# Patient Record
Sex: Female | Born: 1970 | Race: Black or African American | Hispanic: No | Marital: Single | Smoking: Current every day smoker
Health system: Southern US, Community
[De-identification: ages and names within clinical notes are randomized; demographics above are authoritative.]

## PROBLEM LIST (undated history)

## (undated) DIAGNOSIS — R87619 Unspecified abnormal cytological findings in specimens from cervix uteri: Secondary | ICD-10-CM

## (undated) DIAGNOSIS — E119 Type 2 diabetes mellitus without complications: Secondary | ICD-10-CM

## (undated) DIAGNOSIS — N6320 Unspecified lump in the left breast, unspecified quadrant: Secondary | ICD-10-CM

## (undated) DIAGNOSIS — D649 Anemia, unspecified: Secondary | ICD-10-CM

## (undated) DIAGNOSIS — F419 Anxiety disorder, unspecified: Secondary | ICD-10-CM

## (undated) DIAGNOSIS — K219 Gastro-esophageal reflux disease without esophagitis: Secondary | ICD-10-CM

## (undated) DIAGNOSIS — Z8719 Personal history of other diseases of the digestive system: Secondary | ICD-10-CM

## (undated) DIAGNOSIS — F329 Major depressive disorder, single episode, unspecified: Secondary | ICD-10-CM

## (undated) DIAGNOSIS — I10 Essential (primary) hypertension: Secondary | ICD-10-CM

## (undated) DIAGNOSIS — G5600 Carpal tunnel syndrome, unspecified upper limb: Secondary | ICD-10-CM

## (undated) DIAGNOSIS — R7303 Prediabetes: Secondary | ICD-10-CM

## (undated) DIAGNOSIS — F32A Depression, unspecified: Secondary | ICD-10-CM

## (undated) DIAGNOSIS — M1990 Unspecified osteoarthritis, unspecified site: Secondary | ICD-10-CM

## (undated) HISTORY — PX: NO PAST SURGERIES: SHX2092

## (undated) HISTORY — DX: Essential (primary) hypertension: I10

## (undated) HISTORY — DX: Depression, unspecified: F32.A

## (undated) HISTORY — DX: Unspecified abnormal cytological findings in specimens from cervix uteri: R87.619

## (undated) HISTORY — DX: Unspecified lump in the left breast, unspecified quadrant: N63.20

## (undated) HISTORY — DX: Gastro-esophageal reflux disease without esophagitis: K21.9

## (undated) HISTORY — DX: Anxiety disorder, unspecified: F41.9

## (undated) HISTORY — PX: TUBAL LIGATION: SHX77

## (undated) HISTORY — DX: Carpal tunnel syndrome, unspecified upper limb: G56.00

---
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
## 2019-12-09 ENCOUNTER — Ambulatory Visit (INDEPENDENT_AMBULATORY_CARE_PROVIDER_SITE_OTHER): Payer: Managed Care, Other (non HMO) | Admitting: Neurology

## 2019-12-09 ENCOUNTER — Other Ambulatory Visit: Payer: Self-pay

## 2019-12-09 ENCOUNTER — Encounter: Payer: Self-pay | Admitting: Neurology

## 2019-12-09 VITALS — BP 116/77 | HR 63 | Temp 97.4°F | Ht 61.0 in | Wt 157.0 lb

## 2019-12-09 DIAGNOSIS — I10 Essential (primary) hypertension: Secondary | ICD-10-CM | POA: Diagnosis not present

## 2019-12-09 DIAGNOSIS — F419 Anxiety disorder, unspecified: Secondary | ICD-10-CM | POA: Insufficient documentation

## 2019-12-09 DIAGNOSIS — R202 Paresthesia of skin: Secondary | ICD-10-CM

## 2019-12-09 DIAGNOSIS — M25512 Pain in left shoulder: Secondary | ICD-10-CM

## 2019-12-09 DIAGNOSIS — F329 Major depressive disorder, single episode, unspecified: Secondary | ICD-10-CM | POA: Diagnosis not present

## 2019-12-09 DIAGNOSIS — G5600 Carpal tunnel syndrome, unspecified upper limb: Secondary | ICD-10-CM | POA: Insufficient documentation

## 2019-12-09 DIAGNOSIS — G8929 Other chronic pain: Secondary | ICD-10-CM

## 2019-12-09 DIAGNOSIS — M79641 Pain in right hand: Secondary | ICD-10-CM

## 2019-12-09 DIAGNOSIS — F32A Depression, unspecified: Secondary | ICD-10-CM | POA: Insufficient documentation

## 2019-12-09 NOTE — Progress Notes (Signed)
[facility]    Provider:  Dr Lucia Gaskins Requesting Provider: Suzan Slick, MD Primary Care Provider:  Suzan Slick, MD  CC:  Numbness  HPI:  Suzanne Lawrence is a 49 y.o. female here as requested by Suzan Slick, MD for numbness.  Past medical history high blood pressure, depression, carpal tunnel syndrome, anxiety.  I reviewed Dr. Sherilyn Dacosta notes, patient is on Effexor for depression, she reported hand numbness and vertigo, the vertigo resolved but she still reports numbness with the ring finger on the right hand, it comes and goes, has occasional neck pain and discomfort, it appears she is on amitriptyline at bedtime and trazodone.  She has numbness digits 3-4, started in November after starting venlafaxine that she noticed the tingling in fingers and she was waking up soaking wet.Venlafaxine was changed, hot sweats resolved, but still right 4th digit I numb at the tip. No trauma. It is a steady tingle, a little better now, used to be more severe, now it is all day long. No weakness in the hand. Hgba1c is ok, not diabetic. Still has some tingling right lateral feet and radiates to the top of the foot, episodic usually when waking. She has pain in the left shoulder and feels like it comes up to the neck and down the arm. She can't sleep on the left shoulder and in the mornings it is stiff. She has some back ache but nothing significant or shooting into the legs. She had low b12 in the past. No other focal neurologic deficits, associated symptoms, inciting events or modifiable factors.  Reviewed notes, labs and imaging from outside physicians, which showed:  Appears she had a shoulder xray, will request results  Review of Systems: Patient complains of symptoms per HPI as well as the following symptoms: Numbness, shiftwork, restless legs, depression, anxiety, racing thoughts, decreased energy, joint pain, aching muscles, depression, anxiety. Pertinent negatives and  positives per HPI. All others negative.   Social History   Socioeconomic History  . Marital status: Single    Spouse name: Not on file  . Number of children: 3  . Years of education: Not on file  . Highest education level: Some college, no degree  Occupational History  . Not on file  Tobacco Use  . Smoking status: Current Every Day Smoker    Packs/day: 0.50    Types: Cigarettes  . Smokeless tobacco: Never Used  Substance and Sexual Activity  . Alcohol use: Never  . Drug use: Never  . Sexual activity: Not on file  Other Topics Concern  . Not on file  Social History Narrative   Lives at home with 2 daughters   Right handed   Caffeine: about 2-3 per day    Social Determinants of Health   Financial Resource Strain:   . Difficulty of Paying Living Expenses:   Food Insecurity:   . Worried About Programme researcher, broadcasting/film/video in the Last Year:   . Barista in the Last Year:   Transportation Needs:   . Freight forwarder (Medical):   Marland Kitchen Lack of Transportation (Non-Medical):   Physical Activity:   . Days of Exercise per Week:   . Minutes of Exercise per Session:   Stress:   . Feeling of Stress :   Social Connections:   . Frequency of Communication with Friends and Family:   . Frequency of Social Gatherings with Friends and Family:   . Attends Religious Services:   . Active Member  of Clubs or Organizations:   . Attends Banker Meetings:   Marland Kitchen Marital Status:   Intimate Partner Violence:   . Fear of Current or Ex-Partner:   . Emotionally Abused:   Marland Kitchen Physically Abused:   . Sexually Abused:     Family History  Problem Relation Age of Onset  . Lung cancer Mother   . Other Father        multiple issues  . Prostate cancer Father   . Aneurysm Paternal Grandmother   . High blood pressure Other        father's side   . Cerebral aneurysm Other        cerebral, on father's side   . Alcoholism Other        maternal side   . Aneurysm Cousin        paternal  side  . Aneurysm Cousin        paternal side   . Breast cancer Maternal Aunt   . Breast cancer Cousin        maternal side   . Prostate cancer Maternal Uncle   . Alcoholism Maternal Uncle     Past Medical History:  Diagnosis Date  . Abnormal Pap smear of cervix   . Anxiety   . Carpal tunnel syndrome    right  . Depression   . High blood pressure   . Left breast mass     Patient Active Problem List   Diagnosis Date Noted  . High blood pressure   . Depression   . Carpal tunnel syndrome   . Anxiety     Past Surgical History:  Procedure Laterality Date  . NO PAST SURGERIES      Current Outpatient Medications  Medication Sig Dispense Refill  . amitriptyline (ELAVIL) 25 MG tablet Take 25 mg by mouth at bedtime.    Marland Kitchen amLODipine (NORVASC) 10 MG tablet Take 10 mg by mouth daily.    Marland Kitchen atenolol (TENORMIN) 50 MG tablet Take 50 mg by mouth daily.    Marland Kitchen atorvastatin (LIPITOR) 10 MG tablet Take 10 mg by mouth daily.    . chlorthalidone (HYGROTON) 25 MG tablet Take 25 mg by mouth every morning.    . hydrOXYzine (ATARAX/VISTARIL) 10 MG tablet Take by mouth as needed for anxiety.     Marland Kitchen lisinopril (ZESTRIL) 40 MG tablet Take 40 mg by mouth daily.    . Multiple Vitamin (MULTI-VITAMIN) tablet Take by mouth.    . pantoprazole (PROTONIX) 20 MG tablet Take 20 mg by mouth daily.    . traZODone (DESYREL) 50 MG tablet Take by mouth at bedtime as needed.      No current facility-administered medications for this visit.    Allergies as of 12/09/2019 - Review Complete 12/09/2019  Allergen Reaction Noted  . Hydrochlorothiazide  04/02/2017    Vitals: BP 116/77 (BP Location: Left Arm, Patient Position: Sitting)   Pulse 63   Temp (!) 97.4 F (36.3 C)   Ht 5\' 1"  (1.549 m)   Wt 157 lb (71.2 kg)   BMI 29.66 kg/m  Last Weight:  Wt Readings from Last 1 Encounters:  12/09/19 157 lb (71.2 kg)   Last Height:   Ht Readings from Last 1 Encounters:  12/09/19 5\' 1"  (1.549 m)     Physical  exam: Exam: Gen: NAD, conversant, well nourised, obese, well groomed                     CV:  RRR, no MRG. No Carotid Bruits. No peripheral edema, warm, nontender Eyes: Conjunctivae clear without exudates or hemorrhage  Neuro: Detailed Neurologic Exam  Speech:    Speech is normal; fluent and spontaneous with normal comprehension.  Cognition:    The patient is oriented to person, place, and time;     recent and remote memory intact;     language fluent;     normal attention, concentration,     fund of knowledge Cranial Nerves:    The pupils are equal, round, and reactive to light. The fundi are normal and spontaneous venous pulsations are present. Visual fields are full to finger confrontation. Extraocular movements are intact. Trigeminal sensation is intact and the muscles of mastication are normal. The face is symmetric. The palate elevates in the midline. Hearing intact. Voice is normal. Shoulder shrug is normal. The tongue has normal motion without fasciculations.   Coordination:    No dysmetria  Gait:    Normal native gait  Motor Observation:    No asymmetry, no atrophy, and no involuntary movements noted. Tone:    Normal muscle tone.    Posture:    Posture is normal. normal erect    Strength:    Strength is V/V in the upper and lower limbs.      Sensation: intact to LT     Reflex Exam:  DTR's:    Deep tendon reflexes in the upper and lower extremities are normal bilaterally.   Toes:    The toes are downgoing bilaterally.   Clonus:    Clonus is absent.    Assessment/Plan:  Patient here for numbness right hand digit 4 may be CTS. Paresthesias in the legs and top of feet in the morning likely due to temporary peroneal compression as she sleeps in a fetal position, advised her to try and not keep her legs bent for long periods of time. She has pain from the neck radiating into the left shoulder, we will check emg/ncs left arm and right arm  Right hand: Wear wrist  splint all day and order emg/ncs Left shoulder pain may be from radiation from the nck, emg/ncs PT on left arm and shoulder and right hand CTS - Bibo I'll check B12 due to her paresthesias, she is not diabetic   Orders Placed This Encounter  Procedures  . B12 and Folate Panel  . Methylmalonic acid, serum  . Ambulatory referral to Physical Therapy  . NCV with EMG(electromyography)   No orders of the defined types were placed in this encounter.   Cc: Leeanne Rio, MD,    Sarina Ill, MD  [facility] [address] [place], [place] [address]  Phone [phone] Fax [phone]

## 2019-12-09 NOTE — Patient Instructions (Addendum)
Physical Therapy for left arm/shoulder  Electromyoneurogram Electromyoneurogram is a test to check how well your muscles and nerves are working. This procedure includes the combined use of electromyogram (EMG) and nerve conduction study (NCS). EMG is used to look for muscular disorders. NCS, which is also called electroneurogram, measures how well your nerves are controlling your muscles. The procedures are usually done together to check if your muscles and nerves are healthy. If the results of the tests are abnormal, this may indicate disease or injury, such as a neuromuscular disease or peripheral nerve damage. Tell a health care provider about:  Any allergies you have.  All medicines you are taking, including vitamins, herbs, eye drops, creams, and over-the-counter medicines.  Any problems you or family members have had with anesthetic medicines.  Any blood disorders you have.  Any surgeries you have had.  Any medical conditions you have.  If you have a pacemaker.  Whether you are pregnant or may be pregnant. What are the risks? Generally, this is a safe procedure. However, problems may occur, including:  Infection where the electrodes were inserted.  Bleeding. What happens before the procedure? Medicines Ask your health care provider about:  Changing or stopping your regular medicines. This is especially important if you are taking diabetes medicines or blood thinners.  Taking medicines such as aspirin and ibuprofen. These medicines can thin your blood. Do not take these medicines unless your health care provider tells you to take them.  Taking over-the-counter medicines, vitamins, herbs, and supplements. General instructions  Your health care provider may ask you to avoid: ? Beverages that have caffeine, such as coffee and tea. ? Any products that contain nicotine or tobacco. These products include cigarettes, e-cigarettes, and chewing tobacco. If you need help quitting,  ask your health care provider.  Do not use lotions or creams on the same day that you will be having the procedure. What happens during the procedure? For EMG   Your health care provider will ask you to stay in a position so that he or she can access the muscle that will be studied. You may be standing, sitting, or lying down.  You may be given a medicine that numbs the area (local anesthetic).  A very thin needle that has an electrode will be inserted into your muscle.  Another small electrode will be placed on your skin near the muscle.  Your health care provider will ask you to continue to remain still.  The electrodes will send a signal that tells about the electrical activity of your muscles. You may see this on a monitor or hear it in the room.  After your muscles have been studied at rest, your health care provider will ask you to contract or flex your muscles. The electrodes will send a signal that tells about the electrical activity of your muscles.  Your health care provider will remove the electrodes and the electrode needles when the procedure is finished. The procedure may vary among health care providers and hospitals. For NCS   An electrode that records your nerve activity (recording electrode) will be placed on your skin by the muscle that is being studied.  An electrode that is used as a reference (reference electrode) will be placed near the recording electrode.  A paste or gel will be applied to your skin between the recording electrode and the reference electrode.  Your nerve will be stimulated with a mild shock. Your health care provider will measure how much time it  takes for your muscle to react.  Your health care provider will remove the electrodes and the gel when the procedure is finished. The procedure may vary among health care providers and hospitals. What happens after the procedure?  It is up to you to get the results of your procedure. Ask your  health care provider, or the department that is doing the procedure, when your results will be ready.  Your health care provider may: ? Give you medicines for any pain. ? Monitor the insertion sites to make sure that bleeding stops. Summary  Electromyoneurogram is a test to check how well your muscles and nerves are working.  If the results of the tests are abnormal, this may indicate disease or injury.  This is a safe procedure. However, problems may occur, such as bleeding and infection.  Your health care provider will do two tests to complete this procedure. One checks your muscles (EMG) and another checks your nerves (NCS).  It is up to you to get the results of your procedure. Ask your health care provider, or the department that is doing the procedure, when your results will be ready. This information is not intended to replace advice given to you by your health care provider. Make sure you discuss any questions you have with your health care provider. Document Revised: 04/28/2018 Document Reviewed: 04/10/2018 Elsevier Patient Education  [place] tunnel syndrome is a condition that causes pain in your hand and arm. The carpal tunnel is a narrow area that is on the palm side of your wrist. Repeated wrist motion or certain diseases may cause swelling in the tunnel. This swelling can pinch the main nerve in the wrist (median nerve). What are the causes? This condition may be caused by:  Repeated wrist motions.  Wrist injuries.  Arthritis.  A sac of fluid (cyst) or abnormal growth (tumor) in the carpal tunnel.  Fluid buildup during pregnancy. Sometimes the cause is not known. What increases the risk? The following factors may make you more likely to develop this condition:  Having a job in which you move your wrist in the same way many times. This includes jobs like being a Software engineer or a Scientist, water quality.  Being a woman.  Having other  health conditions, such as: ? Diabetes. ? Obesity. ? A thyroid gland that is not active enough (hypothyroidism). ? Kidney failure. What are the signs or symptoms? Symptoms of this condition include:  A tingling feeling in your fingers.  Tingling or a loss of feeling (numbness) in your hand.  Pain in your entire arm. This pain may get worse when you bend your wrist and elbow for a long time.  Pain in your wrist that goes up your arm to your shoulder.  Pain that goes down into your palm or fingers.  A weak feeling in your hands. You may find it hard to grab and hold items. You may feel worse at night. How is this diagnosed? This condition is diagnosed with a medical history and physical exam. You may also have tests, such as:  Electromyogram (EMG). This test checks the signals that the nerves send to the muscles.  Nerve conduction study. This test checks how well signals pass through your nerves.  Imaging tests, such as X-rays, ultrasound, and MRI. These tests check for what might be the cause of your condition. How is this treated? This condition may be treated with:  Lifestyle changes. You will be  asked to stop or change the activity that caused your problem.  Doing exercise and activities that make bones and muscles stronger (physical therapy).  Learning how to use your hand again (occupational therapy).  Medicines for pain and swelling (inflammation). You may have injections in your wrist.  A wrist splint.  Surgery. Follow these instructions at home: If you have a splint:  Wear the splint as told by your doctor. Remove it only as told by your doctor.  Loosen the splint if your fingers: ? Tingle. ? Lose feeling (become numb). ? Turn cold and blue.  Keep the splint clean.  If the splint is not waterproof: ? Do not let it get wet. ? Cover it with a watertight covering when you take a bath or a shower. Managing pain, stiffness, and swelling   If told, put ice  on the painful area: ? If you have a removable splint, remove it as told by your doctor. ? Put ice in a plastic bag. ? Place a towel between your skin and the bag. ? Leave the ice on for 20 minutes, 2-3 times per day. General instructions  Take over-the-counter and prescription medicines only as told by your doctor.  Rest your wrist from any activity that may cause pain. If needed, talk with your boss at work about changes that can help your wrist heal.  Do any exercises as told by your doctor, physical therapist, or occupational therapist.  Keep all follow-up visits as told by your doctor. This is important. Contact a doctor if:  You have new symptoms.  Medicine does not help your pain.  Your symptoms get worse. Get help right away if:  You have very bad numbness or tingling in your wrist or hand. Summary  Carpal tunnel syndrome is a condition that causes pain in your hand and arm.  It is often caused by repeated wrist motions.  Lifestyle changes and medicines are used to treat this problem. Surgery may help in very bad cases.  Follow your doctor's instructions about wearing a splint, resting your wrist, keeping follow-up visits, and calling for help. This information is not intended to replace advice given to you by your health care provider. Make sure you discuss any questions you have with your health care provider. Document Revised: 12/19/2017 Document Reviewed: 12/19/2017 Elsevier Patient Education  2020 [employer] Inc[employer]   Common Peroneal Nerve Entrapment  Common peroneal nerve entrapment is a condition that can make it hard to lift a foot. The condition results from pressure on a nerve in the lower leg called the common peroneal nerve. Your common peroneal nerve provides feeling to your outer lower leg and foot. It also supplies the muscles that move your foot and toes upward and outward. What are the causes? This condition may be caused by:  Sitting cross-legged,  squatting, or kneeling for long periods of time.  A hard, direct hit to the side of the lower leg.  Swelling from a knee injury.  A break (fracture) in one of the lower leg bones.  Wearing a boot or cast that ends just below the knee.  A growth or cyst near the nerve. What increases the risk? This condition is more likely to develop in people who play:  Contact sports, such as football or hockey.  Sports where you wear high and stiff boots, such as skiing. What are the signs or symptoms? Symptoms of this condition include:  Trouble lifting your foot up (foot drop).  Tripping often.  Your foot hitting the ground harder than normal as you walk.  Numbness, tingling, or pain in the outside of the knee, outside of the lower leg, and top of the foot.  Sensitivity to pressure on the front or side of the leg. How is this diagnosed? This condition may be diagnosed based on:  Your symptoms.  Your medical history.  A physical exam.  Tests, such as: ? An X-ray to check the bones of your knee and leg. ? MRI to check tendons that attach to the side of your knee. ? An ultrasound to check for a growth or cyst. ? An electromyogram (EMG) to check your nerves. During your physical exam, your health care provider will check for numbness in your leg and test the strength of your lower leg muscles. He or she may tap the side of your lower leg to see if that causes tingling. How is this treated? Treatment for this condition may include:  Avoiding activities that make symptoms worse.  Using a brace to hold up your foot and toes.  Taking anti-inflammatory pain medicines to relieve swelling and lessen pain.  Having medicines injected into your ankle joint to lessen pain and swelling.  Doing exercises to help you regain or maintain movement (physical therapy).  Surgery to take pressure off the nerve. This may be needed if there is no improvement after 2-3 months or if there is a growth  pushing on the nerve.  Returning gradually to full activity. Follow these instructions at home: If you have a brace:  Wear it as told by your health care provider. Remove it only as told by your health care provider.  Loosen the brace if your toes tingle, become numb, or turn cold and blue.  Keep the brace clean.  If the brace is not waterproof: ? Do not let it get wet. ? Cover it with a watertight covering when you take a bath or a shower.  Ask your health care provider when it is safe to drive with a brace on your foot. Activity  Return to your normal activities as told by your health care provider. Ask your health care provider what activities are safe for you.  Do not do any activities that make pain or swelling worse.  Do exercises as told by your health care provider. General instructions  Take over-the-counter and prescription medicines only as told by your health care provider.  Do not put your full weight on your knee until your health care provider says you can. Use crutches as directed by your health care provider.  Keep all follow-up visits as told by your health care provider. This is important. How is this prevented?  Wear supportive footwear that is appropriate for your athletic activity.  Avoid athletic activities that cause ankle pain or swelling.  Wear protective padding over your lower legs when playing contact sports.  Make sure your boots do not put extra pressure on the area just below your knees.  Do not sit cross-legged for long periods of time. Contact a health care provider if:  Your symptoms do not get better in 2-3 months.  The weakness or numbness in your leg or foot gets worse. Summary  Common peroneal nerve entrapment is a condition that results from pressure on a nerve in the lower leg called the common peroneal nerve.  This condition may be caused by a hard hit, swelling, a fracture, or a cyst in the lower leg.  Treatment may  include rest,  a brace, medicines, and physical therapy. Sometimes surgery is needed.  Do not do any activities that make pain or swelling worse. This information is not intended to replace advice given to you by your health care provider. Make sure you discuss any questions you have with your health care provider. Document Revised: 06/22/2018 Document Reviewed: 06/22/2018 Elsevier Patient Education  2020 [employer].

## 2019-12-12 ENCOUNTER — Encounter: Payer: Self-pay | Admitting: Neurology

## 2019-12-13 ENCOUNTER — Telehealth: Payer: Self-pay | Admitting: *Deleted

## 2019-12-13 NOTE — Telephone Encounter (Signed)
-----   Message from Anson Fret, MD sent at 12/10/2019 11:15 AM EDT ----- B12 normal, thanks

## 2019-12-13 NOTE — Telephone Encounter (Signed)
Called pt and LVM asking for call back. When the pt calls back, please let her know her B12 is normal. We are still waiting on one more lab, will notify if it's abnormal.

## 2019-12-14 NOTE — Telephone Encounter (Signed)
Pt called back and was informed of lab results. Pt verbalized understanding.

## 2019-12-17 LAB — B12 AND FOLATE PANEL
Folate: >20 ng/mL (ref >3.0)
Vitamin B-12: 935 pg/mL (ref 232–1245)

## 2019-12-17 LAB — METHYLMALONIC ACID, SERUM: Methylmalonic Acid: 124 nmol/L (ref 0–378)

## 2019-12-23 ENCOUNTER — Encounter (HOSPITAL_COMMUNITY): Payer: Self-pay

## 2019-12-23 ENCOUNTER — Ambulatory Visit (HOSPITAL_COMMUNITY): Payer: 59 | Attending: Neurology

## 2019-12-23 ENCOUNTER — Other Ambulatory Visit: Payer: Self-pay

## 2019-12-23 DIAGNOSIS — M79641 Pain in right hand: Secondary | ICD-10-CM | POA: Diagnosis not present

## 2019-12-23 DIAGNOSIS — M25512 Pain in left shoulder: Secondary | ICD-10-CM | POA: Diagnosis present

## 2019-12-23 NOTE — Therapy (Signed)
[facility] [address] [place], [place], [address] Phone: [phone]   Fax:  [phone]  Occupational Therapy Evaluation  Patient Details  Name: Suzanne Lawrence MRN: [id number] Date of Birth: April 11, 1971 Referring Provider (OT): Naomie Dean, MD   Encounter Date: 12/23/2019  OT End of Session - 12/23/19 1331    Visit Number  1    Number of Visits  1    Authorization Type  Generic [employer]    Authorization Time Period  60 visit limit combined. No copay or authorization needed.    Authorization - Visit Number  1    Authorization - Number of Visits  60    OT Start Time  1118    OT Stop Time  1204    OT Time Calculation (min)  46 min    Activity Tolerance  Patient tolerated treatment well    Behavior During Therapy  WFL for tasks assessed/performed       Past Medical History:  Diagnosis Date  . Abnormal Pap smear of cervix   . Anxiety   . Carpal tunnel syndrome    right  . Depression   . High blood pressure   . Left breast mass     Past Surgical History:  Procedure Laterality Date  . NO PAST SURGERIES      There were no vitals filed for this visit.  Subjective Assessment - 12/23/19 1126    Subjective   S: The pain in my shoulder feels like a burning sensation.    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 pt reports her carpall tunnel has been bothering her since November 2020 and she was recently instructed by her MD to wear her hand brace 24/7 which has helped a lot. She is only experience slight numbness in the fingertip of her ring finger now. Her shoulder pain has be ongoing since March 2021 when she felt a pop when lifting a heavy roll and moving it at work. X-ray showed no signs of fracture. Patient was referred by Dr. Lucia Gaskins who recommended occupational therapy for evaluation and treatment. Nerve conduction test has been ordered and is scheduled for June.    Patient Stated  Goals  No specific goal stated for therapy. Wishes to find out what is causing her pain.    Currently in Pain?  Yes    Pain Score  4     Pain Location  Shoulder    Pain Orientation  Left    Pain Descriptors / Indicators  Aching;Contraction    Pain Type  Acute pain    Pain Radiating Towards  neck down to upper arm.    Pain Onset  More than a month ago    Pain Frequency  Constant    Aggravating Factors   movement and use    Pain Relieving Factors  heat, repositioning,    Effect of Pain on Daily Activities  can stop her from doing what she needs to do if it increases.    Multiple Pain Sites  No        [facility] OT Assessment - 12/23/19 1130      Assessment   Medical Diagnosis  Lef shoulder pain/right hand CTS    Referring Provider (OT)  Naomie Dean, MD    Onset Date/Surgical Date  --   CTS (Nov. 2020), left shoulder (march 2021)   Hand Dominance  Right    Next MD Visit  June after nerve conduction test    Prior Therapy  None      Precautions   Precautions  None      Restrictions   Weight Bearing Restrictions  No      Balance Screen   Has the patient fallen in the past 6 months  No      Home  Environment   Family/patient expects to be discharged to:  Private residence      Prior Function   Level of Independence  Independent    Vocation  Full time employment    Vocation Requirements  [employer] - physical demanding job. 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      ADL   ADL comments  Pt experiencing increased pain and discomfort in her left shoulder/neck region which is aggrevated by movement and heavy use/lifting.       Mobility   Mobility Status  Independent      Written Expression   Dominant Hand  Right      Vision - History   Baseline Vision  No visual deficits      Cognition   Overall Cognitive Status  Within Functional Limits for tasks assessed      Observation/Other Assessments   Focus on  Therapeutic Outcomes (FOTO)   N/A      Posture/Postural Control   Posture/Postural Control  Postural limitations    Postural Limitations  Rounded Shoulders      ROM / Strength   AROM / PROM / Strength  AROM;PROM;Strength      Palpation   Palpation comment  Mod fascial restriction in the left upper trapezius region.       AROM   Overall AROM   Within functional limits for tasks performed    Overall AROM Comments  Neck and shoulder ROM is WNL in all ranges. Pt experiences discomfort in her neck with left rotation, flexion, and left side bend.       PROM   Overall PROM Comments  Cervical Assessed seated WFL in all ranges.       Strength   Overall Strength Comments  Assessed seated. IR/er abducted    Strength Assessment Site  Hand;Shoulder    Right/Left Shoulder  Left    Left Shoulder Flexion  5/5    Left Shoulder ABduction  5/5    Left Shoulder Internal Rotation  5/5    Left Shoulder External Rotation  5/5    Right/Left hand  Left;Right    Right Hand Grip (lbs)  55    Left Hand Grip (lbs)  55               OT Treatments/Exercises (OP) - 12/23/19 1328      Manual Therapy   Manual Therapy  Other (comment)    Manual therapy comments  Manual techniques completed prior to exercises.    Other Manual Therapy  Strain counterstrain technique completed to left shoulder/upper trapezius region 2 times with passive scapular mobility following.             OT Education - 12/23/19 1329    Education Details  Carpal tunnel HEP: wrist flexion/extension stretch, wrist tendon glides, median nerve glides, wrist strengthening. Shoulder: Use heat for pain management. Do not over stretch the muscle and shorten it instead. Verbally discussed use of strain counterstrain technique if she is able to have somoneone complete it to her at home.    Person(s) Educated  Patient  Methods  Explanation;Demonstration;Handout;Verbal cues    Comprehension  Verbalized understanding       OT  Short Term Goals - 12/23/19 1336      OT SHORT TERM GOAL #1   Title  Patient will be educated and independent with HEP in order to increase comfort level of her right hand and left shoulder in order to complete required work and desired daily tasks.    Time  1    Period  Days    Status  Achieved    Target Date  12/23/19               Plan - 12/23/19 1332    Clinical Impression Statement  A: Patient is a 49 y/o female S/P right CTS and left shoulder pain causing increased fascial restrictions and discomfort while attempting sleep, daily, and work activities. patient interested mainly in figuring out the cause of her pain and what the nerve conduction test will show. Patient did respond well to strain counterstrain technique completed at evaluation with reports of lessened pain/discomfort. Patient was provided with a HEP for her CTS and educated on pain management and shortening versus stretching her left shoulder/neck region in the time being. For now, patient will continue with techniques and HEP independently at home. If she needs to return to therapy after her nerve conduction test she will obtain a new referral from the MD.    OT Occupational Profile and History  Problem Focused Assessment - Including review of records relating to presenting problem    Occupational performance deficits (Please refer to evaluation for details):  Rest and Sleep;ADL's;Work;Leisure    Body Structure / Function / Physical Skills  Pain    Rehab Potential  Good    Clinical Decision Making  Limited treatment options, no task modification necessary    Comorbidities Affecting Occupational Performance:  May have comorbidities impacting occupational performance    Modification or Assistance to Complete Evaluation   No modification of tasks or assist necessary to complete eval    OT Frequency  One time visit    OT Treatment/Interventions  Patient/family education    Plan  P: 1 time visit for HEP and education. Pt  will follow up with OT if needed after nerve conduction test.    Consulted and Agree with Plan of Care  Patient       Patient will benefit from skilled therapeutic intervention in order to improve the following deficits and impairments:   Body Structure / Function / Physical Skills: Pain       Visit Diagnosis: Pain in right hand - Plan: Ot plan of care cert/re-cert  Acute pain of left shoulder - Plan: Ot plan of care cert/re-cert    Problem List Patient Active Problem List   Diagnosis Date Noted  . High blood pressure   . Depression   . Carpal tunnel syndrome   . Anxiety     Limmie Patricia, OTR/L,CBIS  [phone]  12/23/2019, 1:40 PM  [facility] [address], [place], [address] Phone: [phone]   Fax:  [phone]  Name: Jaylanni Eltringham MRN: [id number] Date of Birth: [birth year]

## 2019-12-23 NOTE — Patient Instructions (Signed)
Complete stretches as often as you need with the right hand/wrist.    Wrist Flexion and Extension PROM  Keeping your elbow straight, use your unaffected hand to bend the affected wrist downward as shown. Hold this stretch for 30 seconds. Complete 2 times.  Still keeping your elbow straight, use your unaffected hand to band the wrist upward as shown. Hold this stretch for 30 seconds. Complete 2 times.    WRIST EXTENSION ISOMETRIC  Bend your wrist back and resist into your other hand. Hold for 10 seconds complete 2 times.        Median Nerve Glide  While standing and facing forward, position your arm as if you are holding a tray with your elbow bent and your palm facing the ceiling. Slowly extend the arm until the elbow is straight while bringing your ear to the same shoulder. Be sure to maintain the same hand position (wrist extended) during this whole motion. Return to start. DO NOT OVERSTRETCH THE NERVE! Complete 3-5 times.     Wrist 4 way 10-15 times for each using a low weight.  Observe photos from left to right and top to bottom as if you are reading:  1) The first photo on the top left shows wrist flexion: Hold weight with palm face up pulling knuckles towards the ceiling as you raise your hand up and down with full range of motion of the wrist.  2) The second photo on the top right shows wrist extension: Hold a weight with palm face down pulling knuckles towards the ceiling as you raise your hand up and down with full range of motion of the wrist.  3) The third photo on the bottom left shows radial deviation of the wrist: Hold a weight with your hand turned so that your thumb is on top. Next raise your hand up and down with full range of motion of the wrist.

## 2020-01-05 NOTE — Progress Notes (Signed)
Full Name: Suzanne Lawrence Gender: Female MRN #: [id number] Date of Birth: 1971-01-23    Visit Date: 01/06/2020 09:02 Age: 49 Years Examining Physician: Naomie Dean, MD  Referring Physician: Suzan Slick, MD Height: 5 feet 1 inch    History: Pain radiating from the left shoulder into the left arm, pain and numbness of right hand digit 4. Left shoulder localized pain.  Summary: EMG/NCS was performed on the bilateral upper extremities.   Evaluation of the left Ulnar ADM motor nerve showed decreased conduction velocity (A Elbow-B Elbow, 38 m/s, N>49) with a 56m/s drop across the elbow section (N<52m/s).  The right Ulnar ADM motor nerve showed decreased conduction velocity (A Elbow-B Elbow, 34 m/s, N>49) with a 34m/s drop across the elbow section (N<63m/s).  The right median/ulnar (palm) comparison nerve showed prolonged distal peak latency (Median Palm, 2.4 ms, N<2.2) and abnormal peak latency difference (Median Palm-Ulnar Palm, 0.5 ms, N<0.4) with a relative median delay.  All remaining nerves (as indicated in the following tables) were within normal limits.  All muscles (as indicated in the following tables) were within normal limits.       Conclusion:   1. 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 2. 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  3. No indication of cervical radiculopathy to explain patient's left shoulder pain with radiation from the shoulder into the arm; we will send referral to Orthopaedics for shoulder pain, Ulnar andMedian neuropathies.  Naomie Dean, M.D.  [facility] [address], [address] [place], [place] [address] Tel: [phone] Fax: [phone]  Verbal informed consent was obtained from the patient, patient was informed of  potential risk of procedure, including bruising, bleeding, hematoma formation, infection, muscle weakness, muscle pain, numbness, among others.        MNC    Nerve / Sites Muscle Latency Ref. Amplitude Ref. Rel Amp Segments Distance Velocity Ref. Area    ms ms mV mV %  cm m/s m/s mVms  L Median - APB     Wrist APB 3.4 ?4.4 4.7 ?4.0 100 Wrist - APB 7   14.5     Upper arm APB 7.0  4.7  99.7 Upper arm - Wrist 20 55 ?49 15.2  R Median - APB     Wrist APB 4.0 ?4.4 4.6 ?4.0 100 Wrist - APB 7   14.1     Upper arm APB 8.0  4.4  96.4 Upper arm - Wrist 21 53 ?49 13.6  L Ulnar - ADM     Wrist ADM 2.5 ?3.3 10.3 ?6.0 100 Wrist - ADM 7   27.1     B.Elbow ADM 6.3  9.8  95.1 B.Elbow - Wrist 19 50 ?49 25.9     A.Elbow ADM 8.9  9.4  96 A.Elbow - B.Elbow 10 38 ?49 25.3  R Ulnar - ADM     Wrist ADM 2.8 ?3.3 10.6 ?6.0 100 Wrist - ADM 7   28.0     B.Elbow ADM 6.4  8.2  77 B.Elbow - Wrist 19 52 ?49 26.6     A.Elbow ADM 9.4  7.9  97 A.Elbow - B.Elbow 10 34 ?49 26.7             SNC    Nerve / Sites Rec. Site Peak Lat Ref.  Amp  Ref. Segments Distance Peak Diff Ref.    ms ms V V  cm ms ms  L Median, Ulnar - Transcarpal comparison     Median Palm Wrist 2.1 ?2.2 84 ?35 Median Palm - Wrist 8       Ulnar Palm Wrist 2.0 ?2.2 15 ?12 Ulnar Palm - Wrist 8          Median Palm - Ulnar Palm  0.1 ?0.4  R Median, Ulnar - Transcarpal comparison     Median Palm Wrist 2.4 ?2.2 70 ?35 Median Palm - Wrist 8       Ulnar Palm Wrist 1.9 ?2.2 15 ?12 Ulnar Palm - Wrist 8          Median Palm - Ulnar Palm  0.5 ?0.4  L Median - Orthodromic (Dig II, Mid palm)     Dig II Wrist 3.0 ?3.4 13 ?10 Dig II - Wrist 13    R Median - Orthodromic (Dig II, Mid palm)     Dig II Wrist 3.3 ?3.4 11 ?10 Dig II - Wrist 13    L Ulnar - Orthodromic, (Dig V, Mid palm)     Dig V Wrist 2.8 ?3.1 5 ?5 Dig V - Wrist 11    R Ulnar - Orthodromic, (Dig V, Mid palm)     Dig V Wrist 2.8 ?3.1 6 ?5 Dig V - Wrist 11                   F  Wave    Nerve F  Lat Ref.   ms ms  L Ulnar - ADM 29.5 ?32.0  R Ulnar - ADM 29.2 ?32.0         EMG Summary Table    Spontaneous MUAP Recruitment  Muscle IA Fib PSW Fasc Other Amp Dur. Poly Pattern  L. Cervical paraspinals (low) Normal None None None _______ Normal Normal Normal Normal  R. Cervical paraspinals (low) Normal None None None _______ Normal Normal Normal Normal  L. Deltoid Normal None None None _______ Normal Normal Normal Normal  R. Deltoid Normal None None None _______ Normal Normal Normal Normal  L. Triceps brachii Normal None None None _______ Normal Normal Normal Normal  R. Triceps brachii Normal None None None _______ Normal Normal Normal Normal  L. Flexor digitorum profundus (Ulnar) Normal None None None _______ Normal Normal Normal Normal  R. Flexor digitorum profundus (Ulnar) Normal None None None _______ Normal Normal Normal Normal  L. First dorsal interosseous Normal None None None _______ Normal Normal Normal Normal  R. First dorsal interosseous Normal None None None _______ Normal Normal Normal Normal  L. Opponens pollicis Normal None None None _______ Normal Normal Normal Normal  R. Opponens pollicis Normal None None None _______ Normal Normal Normal Normal  L. Pronator teres Normal None None None _______ Normal Normal Normal Normal  R. Pronator teres Normal None None None _______ Normal Normal Normal Normal  L. Extensor indicis proprius Normal None None None _______ Normal Normal Normal Normal      

## 2020-01-06 ENCOUNTER — Other Ambulatory Visit: Payer: Self-pay

## 2020-01-06 ENCOUNTER — Ambulatory Visit (INDEPENDENT_AMBULATORY_CARE_PROVIDER_SITE_OTHER): Payer: Managed Care, Other (non HMO) | Admitting: Neurology

## 2020-01-06 DIAGNOSIS — R202 Paresthesia of skin: Secondary | ICD-10-CM

## 2020-01-06 DIAGNOSIS — G5623 Lesion of ulnar nerve, bilateral upper limbs: Secondary | ICD-10-CM | POA: Diagnosis not present

## 2020-01-06 DIAGNOSIS — Z0289 Encounter for other administrative examinations: Secondary | ICD-10-CM

## 2020-01-06 DIAGNOSIS — M25512 Pain in left shoulder: Secondary | ICD-10-CM

## 2020-01-06 DIAGNOSIS — G8929 Other chronic pain: Secondary | ICD-10-CM

## 2020-01-06 DIAGNOSIS — M79641 Pain in right hand: Secondary | ICD-10-CM

## 2020-01-06 NOTE — Progress Notes (Signed)
See procedure note.

## 2020-01-06 NOTE — Patient Instructions (Signed)
Ulnar neuropathy Syndrome  Cubital tunnel syndrome is a condition that causes pain and weakness of the forearm and hand. It happens when one of the nerves that runs along the inside of the elbow joint (ulnar nerve) becomes irritated. This condition is usually caused by repeated arm motions that are done during sports or work-related activities. What are the causes? This condition may be caused by:  Increased pressure on the ulnar nerve at the elbow, arm, or forearm. This can result from: ? Irritation caused by repeated elbow bending. ? Poorly healed elbow fractures. ? Tumors in the elbow. These are usually noncancerous (benign). ? Scar tissue that develops in the elbow after an injury. ? Bony growths (spurs) near the ulnar nerve.  Stretching of the nerve due to loose elbow ligaments.  Trauma to the nerve at the elbow. What increases the risk? The following factors may make you more likely to develop this condition:  Doing manual labor that requires frequent bending of the elbow.  Playing sports that include repeated or strenuous throwing motions, such as baseball.  Playing contact sports, such as football or lacrosse.  Not warming up properly before activities.  Having diabetes.  Having an underactive thyroid (hypothyroidism). What are the signs or symptoms? Symptoms of this condition include:  Clumsiness or weakness of the hand.  Tenderness of the inner elbow.  Aching or soreness of the inner elbow, forearm, or fingers, especially the little finger or the ring finger.  Increased pain when forcing the elbow to bend.  Reduced control when throwing objects.  Tingling, numbness, or a burning feeling inside the forearm or in part of the hand or fingers, especially the little finger or the ring finger.  Sharp pains that shoot from the elbow down to the wrist and hand.  The inability to grip or pinch hard. How is this diagnosed? This condition is diagnosed based  on:  Your symptoms and medical history. Your health care provider will also ask for details about any injury.  A physical exam. You may also have tests, including:  Electromyogram (EMG). This test measures electrical signals sent by your nerves into the muscles.  Nerve conduction study. This test measures how well electrical signals pass through your nerves.  Imaging tests, such as X-rays, ultrasound, and MRI. These tests check for possible causes of your condition. How is this treated? This condition may be treated by:  Stopping the activities that are causing your symptoms to get worse.  Icing and taking medicines to reduce pain and swelling.  Wearing a splint to prevent your elbow from bending, or wearing an elbow pad where the ulnar nerve is closest to the skin.  Working with a physical therapist in less severe cases. This may help to: ? Decrease your symptoms. ? Improve the strength and range of motion of your elbow, forearm, and hand. If these treatments do not help, surgery may be needed. Follow these instructions at home: If you have a splint:  Wear the splint as told by your health care provider. Remove it only as told by your health care provider.  Loosen the splint if your fingers tingle, become numb, or turn cold and blue.  Keep the splint clean.  If the splint is not waterproof: ? Do not let it get wet. ? Cover it with a watertight covering when you take a bath or shower. Managing pain, stiffness, and swelling   If directed, put ice on the injured area: ? Put ice in a plastic  bag. ? Place a towel between your skin and the bag. ? Leave the ice on for 20 minutes, 2-3 times a day.  Move your fingers often to avoid stiffness and to lessen swelling.  Raise (elevate) the injured area above the level of your heart while you are sitting or lying down. General instructions  Take over-the-counter and prescription medicines only as told by your health care  provider.  Do any exercise or physical therapy as told by your health care provider.  Do not drive or use heavy machinery while taking prescription pain medicine.  If you were given an elbow pad, wear it as told by your health care provider.  Keep all follow-up visits as told by your health care provider. This is important. Contact a health care provider if:  Your symptoms get worse.  Your symptoms do not get better with treatment.  You have new pain.  Your hand on the injured side feels numb or cold. Summary  Cubital tunnel syndrome is a condition that causes pain and weakness of the forearm and hand.  You are more likely to develop this condition if you do work or play sports that involve repeated arm movements.  This condition is often treated by stopping repetitive activities, applying ice, and using anti-inflammatory medicines.  In rare cases, surgery may be needed. This information is not intended to replace advice given to you by your health care provider. Make sure you discuss any questions you have with your health care provider. Document Revised: 12/29/2017 Document Reviewed: 12/29/2017 Elsevier Patient Education  [place] tunnel syndrome is a condition that causes pain in your hand and arm. The carpal tunnel is a narrow area that is on the palm side of your wrist. Repeated wrist motion or certain diseases may cause swelling in the tunnel. This swelling can pinch the main nerve in the wrist (median nerve). What are the causes? This condition may be caused by:  Repeated wrist motions.  Wrist injuries.  Arthritis.  A sac of fluid (cyst) or abnormal growth (tumor) in the carpal tunnel.  Fluid buildup during pregnancy. Sometimes the cause is not known. What increases the risk? The following factors may make you more likely to develop this condition:  Having a job in which you move your wrist in the same way many times.  This includes jobs like being a Software engineer or a Scientist, water quality.  Being a woman.  Having other health conditions, such as: ? Diabetes. ? Obesity. ? A thyroid gland that is not active enough (hypothyroidism). ? Kidney failure. What are the signs or symptoms? Symptoms of this condition include:  A tingling feeling in your fingers.  Tingling or a loss of feeling (numbness) in your hand.  Pain in your entire arm. This pain may get worse when you bend your wrist and elbow for a long time.  Pain in your wrist that goes up your arm to your shoulder.  Pain that goes down into your palm or fingers.  A weak feeling in your hands. You may find it hard to grab and hold items. You may feel worse at night. How is this diagnosed? This condition is diagnosed with a medical history and physical exam. You may also have tests, such as:  Electromyogram (EMG). This test checks the signals that the nerves send to the muscles.  Nerve conduction study. This test checks how well signals pass through your nerves.  Imaging tests, such as X-rays, ultrasound,  and MRI. These tests check for what might be the cause of your condition. How is this treated? This condition may be treated with:  Lifestyle changes. You will be asked to stop or change the activity that caused your problem.  Doing exercise and activities that make bones and muscles stronger (physical therapy).  Learning how to use your hand again (occupational therapy).  Medicines for pain and swelling (inflammation). You may have injections in your wrist.  A wrist splint.  Surgery. Follow these instructions at home: If you have a splint:  Wear the splint as told by your doctor. Remove it only as told by your doctor.  Loosen the splint if your fingers: ? Tingle. ? Lose feeling (become numb). ? Turn cold and blue.  Keep the splint clean.  If the splint is not waterproof: ? Do not let it get wet. ? Cover it with a watertight covering when  you take a bath or a shower. Managing pain, stiffness, and swelling   If told, put ice on the painful area: ? If you have a removable splint, remove it as told by your doctor. ? Put ice in a plastic bag. ? Place a towel between your skin and the bag. ? Leave the ice on for 20 minutes, 2-3 times per day. General instructions  Take over-the-counter and prescription medicines only as told by your doctor.  Rest your wrist from any activity that may cause pain. If needed, talk with your boss at work about changes that can help your wrist heal.  Do any exercises as told by your doctor, physical therapist, or occupational therapist.  Keep all follow-up visits as told by your doctor. This is important. Contact a doctor if:  You have new symptoms.  Medicine does not help your pain.  Your symptoms get worse. Get help right away if:  You have very bad numbness or tingling in your wrist or hand. Summary  Carpal tunnel syndrome is a condition that causes pain in your hand and arm.  It is often caused by repeated wrist motions.  Lifestyle changes and medicines are used to treat this problem. Surgery may help in very bad cases.  Follow your doctor's instructions about wearing a splint, resting your wrist, keeping follow-up visits, and calling for help. This information is not intended to replace advice given to you by your health care provider. Make sure you discuss any questions you have with your health care provider. Document Revised: 12/19/2017 Document Reviewed: 12/19/2017 Elsevier Patient Education  [place].

## 2020-01-10 NOTE — Procedures (Signed)
Full Name: Suzanne Lawrence Gender: Female MRN #: [id number] Date of Birth: 1971-01-23    Visit Date: 01/06/2020 09:02 Age: 49 Years Examining Physician: Naomie Dean, MD  Referring Physician: Suzan Slick, MD Height: 5 feet 1 inch    History: Pain radiating from the left shoulder into the left arm, pain and numbness of right hand digit 4. Left shoulder localized pain.  Summary: EMG/NCS was performed on the bilateral upper extremities.   Evaluation of the left Ulnar ADM motor nerve showed decreased conduction velocity (A Elbow-B Elbow, 38 m/s, N>49) with a 56m/s drop across the elbow section (N<52m/s).  The right Ulnar ADM motor nerve showed decreased conduction velocity (A Elbow-B Elbow, 34 m/s, N>49) with a 34m/s drop across the elbow section (N<63m/s).  The right median/ulnar (palm) comparison nerve showed prolonged distal peak latency (Median Palm, 2.4 ms, N<2.2) and abnormal peak latency difference (Median Palm-Ulnar Palm, 0.5 ms, N<0.4) with a relative median delay.  All remaining nerves (as indicated in the following tables) were within normal limits.  All muscles (as indicated in the following tables) were within normal limits.       Conclusion:   1. 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 2. 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  3. No indication of cervical radiculopathy to explain patient's left shoulder pain with radiation from the shoulder into the arm; we will send referral to Orthopaedics for shoulder pain, Ulnar andMedian neuropathies.  Naomie Dean, M.D.  [facility] [address], [address] [place], [place] [address] Tel: [phone] Fax: [phone]  Verbal informed consent was obtained from the patient, patient was informed of  potential risk of procedure, including bruising, bleeding, hematoma formation, infection, muscle weakness, muscle pain, numbness, among others.        MNC    Nerve / Sites Muscle Latency Ref. Amplitude Ref. Rel Amp Segments Distance Velocity Ref. Area    ms ms mV mV %  cm m/s m/s mVms  L Median - APB     Wrist APB 3.4 ?4.4 4.7 ?4.0 100 Wrist - APB 7   14.5     Upper arm APB 7.0  4.7  99.7 Upper arm - Wrist 20 55 ?49 15.2  R Median - APB     Wrist APB 4.0 ?4.4 4.6 ?4.0 100 Wrist - APB 7   14.1     Upper arm APB 8.0  4.4  96.4 Upper arm - Wrist 21 53 ?49 13.6  L Ulnar - ADM     Wrist ADM 2.5 ?3.3 10.3 ?6.0 100 Wrist - ADM 7   27.1     B.Elbow ADM 6.3  9.8  95.1 B.Elbow - Wrist 19 50 ?49 25.9     A.Elbow ADM 8.9  9.4  96 A.Elbow - B.Elbow 10 38 ?49 25.3  R Ulnar - ADM     Wrist ADM 2.8 ?3.3 10.6 ?6.0 100 Wrist - ADM 7   28.0     B.Elbow ADM 6.4  8.2  77 B.Elbow - Wrist 19 52 ?49 26.6     A.Elbow ADM 9.4  7.9  97 A.Elbow - B.Elbow 10 34 ?49 26.7             SNC    Nerve / Sites Rec. Site Peak Lat Ref.  Amp  Ref. Segments Distance Peak Diff Ref.    ms ms V V  cm ms ms  L Median, Ulnar - Transcarpal comparison     Median Palm Wrist 2.1 ?2.2 84 ?35 Median Palm - Wrist 8       Ulnar Palm Wrist 2.0 ?2.2 15 ?12 Ulnar Palm - Wrist 8          Median Palm - Ulnar Palm  0.1 ?0.4  R Median, Ulnar - Transcarpal comparison     Median Palm Wrist 2.4 ?2.2 70 ?35 Median Palm - Wrist 8       Ulnar Palm Wrist 1.9 ?2.2 15 ?12 Ulnar Palm - Wrist 8          Median Palm - Ulnar Palm  0.5 ?0.4  L Median - Orthodromic (Dig II, Mid palm)     Dig II Wrist 3.0 ?3.4 13 ?10 Dig II - Wrist 13    R Median - Orthodromic (Dig II, Mid palm)     Dig II Wrist 3.3 ?3.4 11 ?10 Dig II - Wrist 13    L Ulnar - Orthodromic, (Dig V, Mid palm)     Dig V Wrist 2.8 ?3.1 5 ?5 Dig V - Wrist 11    R Ulnar - Orthodromic, (Dig V, Mid palm)     Dig V Wrist 2.8 ?3.1 6 ?5 Dig V - Wrist 39                   F  Wave    Nerve F  Lat Ref.   ms ms  L Ulnar - ADM 29.5 ?32.0  R Ulnar - ADM 29.2 ?32.0         EMG Summary Table    Spontaneous MUAP Recruitment  Muscle IA Fib PSW Fasc Other Amp Dur. Poly Pattern  L. Cervical paraspinals (low) Normal None None None _______ Normal Normal Normal Normal  R. Cervical paraspinals (low) Normal None None None _______ Normal Normal Normal Normal  L. Deltoid Normal None None None _______ Normal Normal Normal Normal  R. Deltoid Normal None None None _______ Normal Normal Normal Normal  L. Triceps brachii Normal None None None _______ Normal Normal Normal Normal  R. Triceps brachii Normal None None None _______ Normal Normal Normal Normal  L. Flexor digitorum profundus (Ulnar) Normal None None None _______ Normal Normal Normal Normal  R. Flexor digitorum profundus (Ulnar) Normal None None None _______ Normal Normal Normal Normal  L. First dorsal interosseous Normal None None None _______ Normal Normal Normal Normal  R. First dorsal interosseous Normal None None None _______ Normal Normal Normal Normal  L. Opponens pollicis Normal None None None _______ Normal Normal Normal Normal  R. Opponens pollicis Normal None None None _______ Normal Normal Normal Normal  L. Pronator teres Normal None None None _______ Normal Normal Normal Normal  R. Pronator teres Normal None None None _______ Normal Normal Normal Normal  L. Extensor indicis proprius Normal None None None _______ Normal Normal Normal Normal

## 2020-01-20 ENCOUNTER — Other Ambulatory Visit: Payer: Self-pay

## 2020-01-20 ENCOUNTER — Ambulatory Visit (INDEPENDENT_AMBULATORY_CARE_PROVIDER_SITE_OTHER): Payer: 59 | Admitting: Orthopaedic Surgery

## 2020-01-20 ENCOUNTER — Ambulatory Visit (INDEPENDENT_AMBULATORY_CARE_PROVIDER_SITE_OTHER): Payer: 59

## 2020-01-20 ENCOUNTER — Encounter: Payer: Self-pay | Admitting: Orthopaedic Surgery

## 2020-01-20 VITALS — BP 130/78 | HR 64 | Ht 61.0 in | Wt 156.0 lb

## 2020-01-20 DIAGNOSIS — G5623 Lesion of ulnar nerve, bilateral upper limbs: Secondary | ICD-10-CM | POA: Diagnosis not present

## 2020-01-20 DIAGNOSIS — M542 Cervicalgia: Secondary | ICD-10-CM

## 2020-01-25 DIAGNOSIS — G5623 Lesion of ulnar nerve, bilateral upper limbs: Secondary | ICD-10-CM | POA: Insufficient documentation

## 2020-01-25 NOTE — Progress Notes (Signed)
Office Visit Note   Patient: Suzanne Lawrence           Date of Birth: 1971/04/30           MRN: [id number] Visit Date: 01/20/2020              Requested by: Melvenia Beam, [address],  [place] [id number] PCP: Leeanne Rio, MD   Assessment & Plan: Visit Diagnoses:  1. Neck pain   2. 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  She may have some degree of mild double crush although her electrical tests were negative for cervical radiculopathy.  We will check her back on follow-up.  We discussed cubital tunnel syndrome.  Follow-Up Instructions: Return in about 5 weeks (around 02/24/2020).   Orders:  Orders Placed This Encounter  Procedures  . XR Cervical Spine 2 or 3 views   No orders of the defined types were placed in this encounter.     Procedures: No procedures performed   Clinical Data: No additional findings.   Subjective: Chief Complaint  Patient presents with  . Left Shoulder - Pain    HPI 49 year old female with onset left shoulder pain in early March she has some days worse than others she states her pain at times is been 10 out of 10.  She got an IM steroid injection which helped some pain radiates into her her left arm down to her hand release down to her wrist.  Sometimes she has to hold her left arm with her right arm to get comfortable.  In March she was lifting some metal coils at work weight 60 pounds plus felt something popped.  She had some soreness for 3 days went away a week later started having increased aching and pain.  She is taken over-the-counter medications which have not been helpful.  She had x-rays of her neck which showed some mild arthritis.  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  Possible slight changes  suggesting carpal tunnel syndrome.  No evidence of cervical radiculopathy was noted.  Review of Systems obvious system positive for anxiety, depression.  Positive for hypertension history of acid reflux and bronchitis.   Objective: Vital Signs: BP 130/78   Pulse 64   Ht 5\' 1"  (1.549 m)   Wt 156 lb (70.8 kg)   BMI 29.48 kg/m   Physical Exam Constitutional:      Appearance: She is well-developed.  HENT:     Head: Normocephalic.     Right Ear: External ear normal.     Left Ear: External ear normal.  Eyes:     Pupils: Pupils are equal, round, and reactive to light.  Neck:     Thyroid: No thyromegaly.     Trachea: No tracheal deviation.  Cardiovascular:     Rate and Rhythm: Normal rate.  Pulmonary:     Effort: Pulmonary effort is normal.  Abdominal:     Palpations: Abdomen is soft.  Skin:    General: Skin is warm and dry.  Neurological:     Mental Status: She is alert and oriented to person, place, and time.  Psychiatric:        Behavior: Behavior normal.     Ortho Exam patient had negative impingement right and left  shoulder.  Acromioclavicular joint is normal with palpation along of the biceps is normal.  Biceps triceps brachial radialis are intact. Normal heel toe gait.  No lower extremity hyperreflexia no clonus.  Patient has mild positive Tinel's over both cubital tunnels right and left.  Phalen's test negative at the wrist.  No interosseous weakness no weakness of the flexor carpi ulnaris.  Triceps biceps is strong and symmetrical. Specialty Comments:  No specialty comments available.  Imaging: No results found.   PMFS History: Patient Active Problem List   Diagnosis Date Noted  . 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 High blood pressure   . Depression   . Carpal tunnel syndrome   . Anxiety    Past Medical History:  Diagnosis Date  . Abnormal Pap smear of cervix   . Anxiety   . Carpal tunnel syndrome    right  . Depression     . High blood pressure   . Left breast mass     Family History  Problem Relation Age of Onset  . Lung cancer Mother   . Other Father        multiple issues  . Prostate cancer Father   . Aneurysm Paternal Grandmother   . High blood pressure Other        father's side   . Cerebral aneurysm Other        cerebral, on father's side   . Alcoholism Other        maternal side   . Aneurysm Cousin        paternal side  . Aneurysm Cousin        paternal side   . Breast cancer Maternal Aunt   . Breast cancer Cousin        maternal side   . Prostate cancer Maternal Uncle   . Alcoholism Maternal Uncle     Past Surgical History:  Procedure Laterality Date  . NO PAST SURGERIES     Social History   Occupational History  . Not on file  Tobacco Use  . Smoking status: Current Every Day Smoker    Packs/day: 0.50    Types: Cigarettes  . Smokeless tobacco: Never Used  Substance and Sexual Activity  . Alcohol use: Never  . Drug use: Never  . Sexual activity: Not on file

## 2020-08-26 HISTORY — PX: COLONOSCOPY: SHX174

## 2020-11-01 ENCOUNTER — Other Ambulatory Visit: Payer: Self-pay | Admitting: Family Medicine

## 2020-11-01 DIAGNOSIS — R921 Mammographic calcification found on diagnostic imaging of breast: Secondary | ICD-10-CM

## 2020-11-10 ENCOUNTER — Ambulatory Visit
Admission: RE | Admit: 2020-11-10 | Discharge: 2020-11-10 | Disposition: A | Discharge status: Home / Self Care | Payer: 59 | Source: Ambulatory Visit | Attending: Family Medicine | Admitting: Family Medicine

## 2020-11-10 ENCOUNTER — Other Ambulatory Visit: Payer: Self-pay | Admitting: Family Medicine

## 2020-11-10 ENCOUNTER — Other Ambulatory Visit: Payer: Self-pay

## 2020-11-10 DIAGNOSIS — R921 Mammographic calcification found on diagnostic imaging of breast: Secondary | ICD-10-CM

## 2020-11-28 ENCOUNTER — Encounter: Payer: Self-pay | Admitting: Internal Medicine

## 2020-12-24 HISTORY — PX: OTHER SURGICAL HISTORY: SHX169

## 2021-01-10 ENCOUNTER — Ambulatory Visit: Payer: 59 | Admitting: Internal Medicine

## 2021-03-26 DIAGNOSIS — I82409 Acute embolism and thrombosis of unspecified deep veins of unspecified lower extremity: Secondary | ICD-10-CM

## 2021-03-26 HISTORY — DX: Acute embolism and thrombosis of unspecified deep veins of unspecified lower extremity: I82.409

## 2021-05-02 ENCOUNTER — Encounter: Payer: Self-pay | Admitting: *Deleted

## 2021-05-02 ENCOUNTER — Other Ambulatory Visit: Payer: Self-pay

## 2021-05-02 ENCOUNTER — Ambulatory Visit (INDEPENDENT_AMBULATORY_CARE_PROVIDER_SITE_OTHER): Payer: 59 | Admitting: Gastroenterology

## 2021-05-02 ENCOUNTER — Encounter: Payer: Self-pay | Admitting: Gastroenterology

## 2021-05-02 DIAGNOSIS — K219 Gastro-esophageal reflux disease without esophagitis: Secondary | ICD-10-CM | POA: Diagnosis not present

## 2021-05-02 NOTE — Patient Instructions (Signed)
Start taking your Dexilant 30 minutes before your last meal of the day. Take your Pepcid at breakfast. You can try Reflux Gourmet 1 teaspoon at bedtime to help reduce reflux and heartburn. You can purchase online at [employer] or from [email or web address] Xray of your esophagus/stomach as planned. We will be in touch with results typically within 5 business days. Please make sure you allow 2-3 hours between eating/drinking and lying down. See other reflux instructions.  I have provided DVT information we have available that you may find helpful.

## 2021-05-02 NOTE — Progress Notes (Signed)
Primary Care Physician:  [facility], Dr. Fanny Bien Primary Gastroenterologist:  Hennie Duos. Marletta Lor, DO   Chief Complaint  Patient presents with   Gastroesophageal Reflux    Worse when lying down    HPI:  Suzanne Lawrence is a 50 y.o. female here at the request of Dr. Fanny Bien for further evaluation of acid reflux.  Patient presents today with complaints of nocturnal acid reflux.  Several months ago she was initially put on pantoprazole 20 mg daily.  Ultimately she was switched to Dexilant 60 mg in the morning, Pepcid 40 mg at bedtime added.  Her daytime heartburn is well controlled.  Biggest complaints of nocturnal symptoms including gurgling noise in her esophagus, regurgitation of bad tasting fluid. Complains of "digestive sounds" in her stomach at night which keep her up.  She denies any abdominal pain. No dysphagia. No vomiting.  She states she has been out of work since May when she had shoulder surgery.  Has gained about 10 pounds since then.  When she was working, typically would eat late, on the way home from work sometimes.  Would lay down within an hour of eating.  States for the past few months, she has been able to wait 2 to 3 hours at least before laying down after eating.  She does not feel like it makes a difference.   She was diagnosed with DVT of the left upper extremity last week.  Started on Eliquis.   States she had a colonoscopy with Dr. Arlyn Dunning earlier this year, had multiple polyps removed and advised to come back in 1 year per patient.  Labs from April 2022: White blood cell count 7100, hemoglobin 11.9, hematocrit 36.8, MCV 73.7, platelets 440,000.  Labs from April 2021: B12 935, folate greater than 20.    Current Outpatient Medications  Medication Sig Dispense Refill   amLODipine (NORVASC) 10 MG tablet Take 10 mg by mouth daily.     atenolol (TENORMIN) 50 MG tablet Take 50 mg by mouth daily.     atorvastatin (LIPITOR) 10 MG tablet  Take 10 mg by mouth daily.     chlorthalidone (HYGROTON) 25 MG tablet Take 25 mg by mouth every morning.     dexlansoprazole (DEXILANT) 60 MG capsule Take 1 capsule by mouth daily.     DULoxetine (CYMBALTA) 60 MG capsule Take 60 mg by mouth daily.     ELIQUIS DVT/PE STARTER PACK Take 5 mg by mouth as directed.     famotidine (PEPCID) 40 MG tablet Take 40 mg by mouth at bedtime.     gabapentin (NEURONTIN) 300 MG capsule Take 300 mg by mouth at bedtime.     lisinopril (ZESTRIL) 40 MG tablet Take 40 mg by mouth daily.     QUEtiapine (SEROQUEL) 50 MG tablet Take 50 mg by mouth at bedtime.     traZODone (DESYREL) 50 MG tablet Take by mouth at bedtime as needed.      No current facility-administered medications for this visit.    Allergies as of 05/02/2021 - Review Complete 05/02/2021  Allergen Reaction Noted   Hydrochlorothiazide  04/02/2017    Past Medical History:  Diagnosis Date   Abnormal Pap smear of cervix    Anxiety    Carpal tunnel syndrome    right   Depression    DVT (deep venous thrombosis) (HCC) 03/2021   left arm   GERD (gastroesophageal reflux disease)    High blood pressure    Left breast mass  Past Surgical History:  Procedure Laterality Date   COLONOSCOPY  2022   Arlyn Dunning: several polyps removed, has to return in one year   left shoulder arthroscopy  12/2020   TUBAL LIGATION      Family History  Problem Relation Age of Onset   Lung cancer Mother    Other Father        multiple issues, doesn't know history for sure   Prostate cancer Father    Stroke Father    Aneurysm Paternal Grandmother    Breast cancer Maternal Aunt    Prostate cancer Maternal Uncle    Alcoholism Maternal Uncle    Aneurysm Cousin        paternal side   Aneurysm Cousin        paternal side    Breast cancer Cousin        maternal side    High blood pressure Other        father's side    Cerebral aneurysm Other        cerebral, on father's side    Alcoholism Other         maternal side    Colon cancer Neg Hx     Social History   Socioeconomic History   Marital status: Single    Spouse name: Not on file   Number of children: 3   Years of education: Not on file   Highest education level: Some college, no degree  Occupational History   Not on file  Tobacco Use   Smoking status: Former    Packs/day: 0.50    Types: Cigarettes   Smokeless tobacco: Never  Vaping Use   Vaping Use: Never used  Substance and Sexual Activity   Alcohol use: Never   Drug use: Never   Sexual activity: Not on file  Other Topics Concern   Not on file  Social History Narrative   Lives at home with 2 daughters   Right handed   Caffeine: about 2-3 per day    Social Determinants of Health   Financial Resource Strain: Not on file  Food Insecurity: Not on file  Transportation Needs: Not on file  Physical Activity: Not on file  Stress: Not on file  Social Connections: Not on file  Intimate Partner Violence: Not on file      ROS:  General: Negative for anorexia, weight loss, fever, chills, fatigue, weakness. Eyes: Negative for vision changes.  ENT: Negative for hoarseness, difficulty swallowing , nasal congestion. CV: Negative for chest pain, angina, palpitations, dyspnea on exertion, peripheral edema.  Left upper extremity swelling improved since starting Eliquis last week. Respiratory: Negative for dyspnea at rest, dyspnea on exertion, cough, sputum, wheezing.  GI: See history of present illness. GU:  Negative for dysuria, hematuria, urinary incontinence, urinary frequency, nocturnal urination.  MS: Negative for joint pain, low back pain.  Derm: Negative for rash or itching.  Neuro: Negative for weakness, abnormal sensation, seizure, frequent headaches, memory loss, confusion.  Psych: Negative for anxiety, depression, suicidal ideation, hallucinations.  Endo: Negative for unusual weight change.  Heme: Negative for bruising or bleeding. Allergy: Negative for  rash or hives.    Physical Examination:  BP 123/74   Pulse 67   Temp 97.7 F (36.5 C) (Temporal)   Ht 5\' 1"  (1.549 m)   Wt 167 lb 3.2 oz (75.8 kg)   LMP 05/01/2021 (Approximate)   BMI 31.59 kg/m    General: Well-nourished, well-developed in no acute distress.  Head:  Normocephalic, atraumatic.   Eyes: Conjunctiva pink, no icterus. Mouth: masked. Neck: Supple without thyromegaly, masses, or lymphadenopathy.  Lungs: Clear to auscultation bilaterally.  Heart: Regular rate and rhythm, no murmurs rubs or gallops.  Abdomen: Bowel sounds are normal, nontender, nondistended, no hepatosplenomegaly or masses, no abdominal bruits or    hernia , no rebound or guarding.   Rectal: not performed Extremities: No lower extremity edema. No clubbing or deformities.  Neuro: Alert and oriented x 4 , grossly normal neurologically.  Skin: Warm and dry, no rash or jaundice.   Psych: Alert and cooperative, normal mood and affect.  Labs: See hpi  Imaging Studies: No results found.   Assessment:  50 year old female presenting for further evaluation of refractory GERD. Recent diagnosis of DVT of left upper extremity last week and now on Eliquis. Daytime heartburn symptoms well controlled.  Complains of regurgitation, burning at nighttime when she lays down. Currently on Pepcid at night. Dexilant before breakfast. She has been able to eat earlier while at of work allowing for more time between meals and laying down but continues to have symptoms.  Would typically offer upper endoscopy to evaluate for complications of reflux, assess for hiatal hernia, etc but due to recent DVT/Eliquis would try to hold off on that unless significant change in symptoms. We can evaluate via barium study for now.   Reinforced antireflux measures. Handout provided as well.   Plan: UGI series. Take Dexilant before supper and Pepcid before breakfast.  Consider trying Reflux Gourmet 1 teaspoon at bedtime.  Consider  elevating head of bed or add pillow wedge. Allow 3 hours between eating/drinking and laying down.

## 2021-05-07 ENCOUNTER — Ambulatory Visit (HOSPITAL_COMMUNITY)
Admission: RE | Admit: 2021-05-07 | Discharge: 2021-05-07 | Disposition: A | Discharge status: Home / Self Care | Payer: 59 | Source: Ambulatory Visit | Attending: Gastroenterology | Admitting: Gastroenterology

## 2021-05-07 ENCOUNTER — Other Ambulatory Visit: Payer: Self-pay

## 2021-05-07 DIAGNOSIS — K219 Gastro-esophageal reflux disease without esophagitis: Secondary | ICD-10-CM | POA: Insufficient documentation

## 2021-08-08 ENCOUNTER — Telehealth: Payer: Self-pay | Admitting: Orthopedic Surgery

## 2021-08-08 NOTE — Telephone Encounter (Signed)
Patient called and wants a 2nd opinion with Dr. Dallas Schimke.  She has been going to Dr. Emilia Beck and she has had shoulder surgery in May of 2022.   I advised her we will need all the notes from Dr. Emilia Beck office and the Surgery notes as well along with a CD for all XRAY,. MRI, and CT, and she will need to bring them to our office for Dr. Dallas Schimke to review and see if this is something we can do.   She said she has already had MRI done.   She also states she has called her insurance company and they said she could get a 2nd opinion.   I said that is fine but we still need all the information for Dr. Dallas Schimke to review to see if he can see her for the issues she wants to be seen for.   She said oh ok Thank You and hung up.

## 2021-09-26 HISTORY — PX: COLONOSCOPY: SHX174

## 2021-10-01 ENCOUNTER — Ambulatory Visit: Payer: 59 | Admitting: Gastroenterology

## 2021-10-30 ENCOUNTER — Encounter: Payer: Self-pay | Admitting: Gastroenterology

## 2021-12-13 ENCOUNTER — Inpatient Hospital Stay (HOSPITAL_COMMUNITY): Payer: 59 | Attending: Hematology | Admitting: Hematology

## 2021-12-13 ENCOUNTER — Encounter (HOSPITAL_COMMUNITY): Payer: Self-pay | Admitting: Hematology

## 2021-12-13 ENCOUNTER — Other Ambulatory Visit: Payer: Self-pay

## 2021-12-13 DIAGNOSIS — F5089 Other specified eating disorder: Secondary | ICD-10-CM | POA: Insufficient documentation

## 2021-12-13 DIAGNOSIS — Z800 Family history of malignant neoplasm of digestive organs: Secondary | ICD-10-CM | POA: Insufficient documentation

## 2021-12-13 DIAGNOSIS — K621 Rectal polyp: Secondary | ICD-10-CM | POA: Insufficient documentation

## 2021-12-13 DIAGNOSIS — Z801 Family history of malignant neoplasm of trachea, bronchus and lung: Secondary | ICD-10-CM | POA: Diagnosis not present

## 2021-12-13 DIAGNOSIS — Z803 Family history of malignant neoplasm of breast: Secondary | ICD-10-CM | POA: Insufficient documentation

## 2021-12-13 DIAGNOSIS — Z8042 Family history of malignant neoplasm of prostate: Secondary | ICD-10-CM | POA: Insufficient documentation

## 2021-12-13 DIAGNOSIS — Z87891 Personal history of nicotine dependence: Secondary | ICD-10-CM | POA: Insufficient documentation

## 2021-12-13 DIAGNOSIS — R5383 Other fatigue: Secondary | ICD-10-CM | POA: Diagnosis not present

## 2021-12-13 DIAGNOSIS — N920 Excessive and frequent menstruation with regular cycle: Secondary | ICD-10-CM | POA: Diagnosis not present

## 2021-12-13 DIAGNOSIS — D509 Iron deficiency anemia, unspecified: Secondary | ICD-10-CM | POA: Diagnosis not present

## 2021-12-13 DIAGNOSIS — D500 Iron deficiency anemia secondary to blood loss (chronic): Secondary | ICD-10-CM | POA: Insufficient documentation

## 2021-12-13 NOTE — Progress Notes (Signed)
? ?[facility] ?[address] ?[place], [place] [address] ? ? ?CLINIC:  ?Medical Oncology/Hematology ? ?Patient Care Team: ?[facility] as PCP - General ?Eloise Harman, DO as Consulting Physician (Internal Medicine) ? ?CHIEF COMPLAINTS/PURPOSE OF CONSULTATION:  ?Evaluation and management of IDA ? ?HISTORY OF PRESENTING ILLNESS:  ?Suzanne Lawrence 51 y.o. female is here because of evaluation of IDA, at the request of Dr. Jimmye Norman. ? ?Today she reports feeling well. She has been taking 1 iron tablet daily since January, and she is tolerating them well. She denies heart burn, diarrhea, and constipation. This is her first time taking iron tablets. She reports dry throat and ice pica. She denies hematuria and hematochezia. She reports she had a uterine ablation in March. Her menses prior to ablation have were 8 days of heavy bleeding occurring 1-2 times monthly with intermittent spotting. She has not yet had a menstrual period since the ablation. Her energy is low. She reports painful pressure in her lower back starting a couple of months ago, and she reports muscle spasms from her neck to lower black. She denies history iron infusions and blood transfusions.  ? ?She is currently on long term disability, and previously she was a Glass blower/designer, worked in a [employer], and mixed Loss adjuster, chartered at [employer]; she reports chemical exposure at work. She currently smoke 1/2-1/3 ppd, and she has smoked for 25 years. She denies family history of anemia, sickle cell disease, and thalassemia. Her mother had lung cancer, her father had prostate and colon cancer, her maternal aunt had breast cancer, multiple maternal cousins had breast cancer, and a maternal uncle had colon cancer.  ? ?MEDICAL HISTORY:  ?Past Medical History:  ?Diagnosis Date  ? Abnormal Pap smear of cervix   ? Anxiety   ? Carpal tunnel syndrome   ? right  ? Depression   ? DVT (deep venous thrombosis) ([facility]) 03/2021  ? left arm  ? GERD  (gastroesophageal reflux disease)   ? High blood pressure   ? Left breast mass   ? ? ?SURGICAL HISTORY: ?Past Surgical History:  ?Procedure Laterality Date  ? COLONOSCOPY  2022  ? Megan Straughan: several polyps removed, has to return in one year  ? left shoulder arthroscopy  12/2020  ? TUBAL LIGATION    ? ? ?SOCIAL HISTORY: ?Social History  ? ?Socioeconomic History  ? Marital status: Single  ?  Spouse name: Not on file  ? Number of children: 3  ? Years of education: Not on file  ? Highest education level: Some college, no degree  ?Occupational History  ? Not on file  ?Tobacco Use  ? Smoking status: Former  ?  Packs/day: 0.50  ?  Types: Cigarettes  ? Smokeless tobacco: Never  ?Vaping Use  ? Vaping Use: Never used  ?Substance and Sexual Activity  ? Alcohol use: Never  ? Drug use: Never  ? Sexual activity: Not on file  ?Other Topics Concern  ? Not on file  ?Social History Narrative  ? Lives at home with 2 daughters  ? Right handed  ? Caffeine: about 2-3 per day   ? ?Social Determinants of Health  ? ?Financial Resource Strain: Not on file  ?Food Insecurity: Not on file  ?Transportation Needs: Not on file  ?Physical Activity: Not on file  ?Stress: Not on file  ?Social Connections: Not on file  ?Intimate Partner Violence: Not on file  ? ? ?FAMILY HISTORY: ?Family History  ?Problem Relation Age of  Onset  ? Lung cancer Mother   ? Other Father   ?     multiple issues, doesn't know history for sure  ? Prostate cancer Father   ? Stroke Father   ? Aneurysm Paternal Grandmother   ? Breast cancer Maternal Aunt   ? Prostate cancer Maternal Uncle   ? Alcoholism Maternal Uncle   ? Aneurysm Cousin   ?     paternal side  ? Aneurysm Cousin   ?     paternal side   ? Breast cancer Cousin   ?     maternal side   ? High blood pressure Other   ?     father's side   ? Cerebral aneurysm Other   ?     cerebral, on father's side   ? Alcoholism Other   ?     maternal side   ? Colon cancer Neg Hx   ? ? ?ALLERGIES:  is allergic to  hydrochlorothiazide. ? ?MEDICATIONS:  ?Current Outpatient Medications  ?Medication Sig Dispense Refill  ? atenolol (TENORMIN) 50 MG tablet Take 50 mg by mouth daily.    ? atorvastatin (LIPITOR) 10 MG tablet Take 10 mg by mouth daily.    ? chlorthalidone (HYGROTON) 25 MG tablet Take 25 mg by mouth every morning.    ? DULoxetine (CYMBALTA) 60 MG capsule Take 60 mg by mouth daily.    ? famotidine (PEPCID) 40 MG tablet Take 40 mg by mouth daily.    ? gabapentin (NEURONTIN) 300 MG capsule Take 300 mg by mouth at bedtime.    ? lisinopril (ZESTRIL) 40 MG tablet Take 40 mg by mouth daily.    ? QUEtiapine (SEROQUEL) 50 MG tablet Take 50 mg by mouth at bedtime.    ? amLODipine (NORVASC) 10 MG tablet Take 10 mg by mouth daily. (Patient not taking: Reported on 12/13/2021)    ? dexlansoprazole (DEXILANT) 60 MG capsule Take 1 capsule by mouth daily before supper. (Patient not taking: Reported on 12/13/2021)    ? ELIQUIS DVT/PE STARTER PACK Take 5 mg by mouth as directed. (Patient not taking: Reported on 12/13/2021)    ? traZODone (DESYREL) 50 MG tablet Take by mouth at bedtime as needed.     ? Vitamin D, Ergocalciferol, (DRISDOL) 1.25 MG (50000 UNIT) CAPS capsule Take 50,000 Units by mouth once a week.    ? ?No current facility-administered medications for this visit.  ? ? ?REVIEW OF SYSTEMS:   ?Review of Systems  ?Constitutional:  Positive for fatigue. Negative for appetite change.  ?HENT:     ?     Dry throat  ?Gastrointestinal:  Negative for constipation and diarrhea.  ?Musculoskeletal:  Positive for back pain (4/10 low).  ?All other systems reviewed and are negative. ? ? ?PHYSICAL EXAMINATION: ?ECOG PERFORMANCE STATUS: 1 - Symptomatic but completely ambulatory ? ?Vitals:  ? 12/13/21 1309  ?BP: (!) 171/89  ?Pulse: 74  ?Resp: 16  ?Temp: 97.9 ?F (36.6 ?C)  ?SpO2: 97%  ? ?Filed Weights  ? 12/13/21 1309  ?Weight: 178 lb 8 oz (81 kg)  ? ?Physical Exam ?Vitals reviewed.  ?Constitutional:   ?   Appearance: Normal appearance. She is  obese.  ?Cardiovascular:  ?   Rate and Rhythm: Normal rate and regular rhythm.  ?   Pulses: Normal pulses.  ?   Heart sounds: Normal heart sounds.  ?Pulmonary:  ?   Effort: Pulmonary effort is normal.  ?   Breath sounds: Normal breath sounds.  ?Abdominal:  ?  Palpations: Abdomen is soft. There is no hepatomegaly, splenomegaly or mass.  ?   Tenderness: There is no abdominal tenderness.  ?Musculoskeletal:  ?   Right lower leg: No edema.  ?   Left lower leg: No edema.  ?Lymphadenopathy:  ?   Cervical: No cervical adenopathy.  ?   Right cervical: No superficial, deep or posterior cervical adenopathy. ?   Left cervical: No superficial, deep or posterior cervical adenopathy.  ?   Upper Body:  ?   Right upper body: No supraclavicular or axillary adenopathy.  ?   Left upper body: No supraclavicular or axillary adenopathy.  ?   Lower Body: No right inguinal adenopathy. No left inguinal adenopathy.  ?Neurological:  ?   General: No focal deficit present.  ?   Mental Status: She is alert and oriented to person, place, and time.  ?Psychiatric:     ?   Mood and Affect: Mood normal.     ?   Behavior: Behavior normal.  ? ? ? ?LABORATORY DATA:  ?I have reviewed the data as listed ?No results found for this or any previous visit (from the past 2160 hour(s)). ? ?RADIOGRAPHIC STUDIES: ?I have personally reviewed the radiological images as listed and agreed with the findings in the report. ?No results found. ? ?ASSESSMENT: ? ?1.  Iron deficiency anemia: ?- She has been on iron tablet daily since January of this year and is tolerating well. ?- She reports ice pica.  Also has severe fatigue. ?- Labs on 11/13/2021 with hemoglobin 9.5, MCV 65.  Ferritin was 5.6. ?- Labs on 10/08/2021: Ferritin was 7 ?- She has menorrhagia, status post endometrial ablation in March 2023. ?- Colonoscopy (10/03/2021): Removal of 2 sessile polyps in the descending colon, 2 sessile polyps in the sigmoid colon, 4 sessile polyps in the sigmoid colon, 3 sessile polyps  in the rectosigmoid colon.  Pathology report shows all removed polyps to be hyperplastic with no dysplasia or malignancy. ? ?2.  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  She is currently on

## 2021-12-13 NOTE — Patient Instructions (Addendum)
[facility] at [facility] ?Discharge Instructions ? ? ?You were seen and examined today by Dr. Delton Coombes. He is a blood specialist who is seeing you for iron deficiency anemia.  ? ?We will arrange for you to have IV iron infusions.  You will get a total of 3 infusions, 1 week apart.  ? ?We will check back with you in 3 months and recheck your blood work prior to your next visit.  ? ? ? ? ?Thank you for choosing [facility] to provide your oncology and hematology care.  To afford each patient quality time with our provider, please arrive at least 15 minutes before your scheduled appointment time.  ? ?If you have a lab appointment with the [facility] please come in thru the Main Entrance and check in at the main information desk. ? ?You need to re-schedule your appointment should you arrive 10 or more minutes late.  We strive to give you quality time with our providers, and arriving late affects you and other patients whose appointments are after yours.  Also, if you no show three or more times for appointments you may be dismissed from the clinic at the providers discretion.     ?Again, thank you for choosing [facility].  Our hope is that these requests will decrease the amount of time that you wait before being seen by our physicians.       ?_____________________________________________________________ ? ?Should you have questions after your visit to [facility], please contact our office at [phone] and follow the prompts.  Our office hours are 8:00 a.m. and 4:30 p.m. Monday - Friday.  Please note that voicemails left after 4:00 p.m. may not be returned until the following business day.  We are closed weekends and major holidays.  You do have access to a nurse 24-7, just call the main number to the clinic [phone] and do not press any options, hold on the line and a nurse will answer the phone.   ? ?For prescription  refill requests, have your pharmacy contact our office and allow 72 hours.   ? ?Due to Covid, you will need to wear a mask upon entering the hospital. If you do not have a mask, a mask will be given to you at the Main Entrance upon arrival. For doctor visits, patients may have 1 support person age 26 or older with them. For treatment visits, patients can not have anyone with them due to social distancing guidelines and our immunocompromised population.  ? ?   ?

## 2021-12-14 ENCOUNTER — Encounter (HOSPITAL_COMMUNITY): Payer: Self-pay | Admitting: Hematology

## 2021-12-18 ENCOUNTER — Encounter (HOSPITAL_COMMUNITY): Payer: Self-pay

## 2021-12-18 ENCOUNTER — Inpatient Hospital Stay (HOSPITAL_COMMUNITY): Payer: 59

## 2021-12-18 VITALS — BP 165/77 | HR 70 | Temp 97.3°F | Resp 18

## 2021-12-18 DIAGNOSIS — D500 Iron deficiency anemia secondary to blood loss (chronic): Secondary | ICD-10-CM

## 2021-12-18 DIAGNOSIS — D509 Iron deficiency anemia, unspecified: Secondary | ICD-10-CM | POA: Diagnosis not present

## 2021-12-18 MED ORDER — SODIUM CHLORIDE 0.9 % IV SOLN: Freq: Once | INTRAVENOUS | Status: AC

## 2021-12-18 MED ORDER — DIPHENHYDRAMINE HCL 50 MG/ML IJ SOLN
12.5000 mg | Freq: Once | INTRAMUSCULAR | Status: AC
Start: 2021-12-18 — End: 2021-12-18
  Administered 2021-12-18: 12.5 mg via INTRAVENOUS
  Filled 2021-12-18: qty 1

## 2021-12-18 MED ORDER — SODIUM CHLORIDE 0.9 % IV SOLN
510.0000 mg | Freq: Once | INTRAVENOUS | Status: AC
  Administered 2021-12-18: 510 mg via INTRAVENOUS
  Filled 2021-12-18: qty 510

## 2021-12-18 MED ORDER — METHYLPREDNISOLONE SODIUM SUCC 125 MG IJ SOLR
125.0000 mg | Freq: Once | INTRAMUSCULAR | Status: AC
  Administered 2021-12-18: 125 mg via INTRAVENOUS
  Filled 2021-12-18: qty 2

## 2021-12-18 MED ORDER — FAMOTIDINE IN NACL 20-0.9 MG/50ML-% IV SOLN
20.0000 mg | Freq: Once | INTRAVENOUS | Status: AC
  Administered 2021-12-18: 20 mg via INTRAVENOUS
  Filled 2021-12-18: qty 50

## 2021-12-18 NOTE — Progress Notes (Signed)
Hypersensitivity Reaction note ? ?Date of event: 12/18/21 ?Time of event: 14 ?Generic name of drug involved: Ferumoxytol ?Name of provider notified of the hypersensitivity reaction: Dr. Delton Coombes ?Was agent that likely caused hypersensitivity reaction added to Allergies List within EMR? Yes ?Chain of events including reaction signs/symptoms, treatment administered, and outcome (e.g., drug resumed; drug discontinued; sent to Emergency Department; etc.) Iron started at 1443, Patient called out at 1453 with complaints of feeling hot, pressure in chest and nausea. Iron stopped and NS bolus started, Dr. Delton Coombes reported to room and ordered Pepcid 20mg , Solu-medrol 125mg  and Benadryl 12.5. Sympotoms started to improve with bolus, medications administered with orders to restart treatment after symptoms subside. Iron restarted. Patient tolerated iron with no complaints voiced. ? ?Elpidio Anis, RN ?12/18/2021 4:14 PM ? ?

## 2021-12-18 NOTE — Patient Instructions (Signed)
[facility]  Discharge Instructions: ?Thank you for choosing [facility] to provide your oncology and hematology care.  ?If you have a lab appointment with the Cancer Center, please come in thru the Main Entrance and check in at the main information desk. ? ?Wear comfortable clothing and clothing appropriate for easy access to any Portacath or PICC line.  ? ?We strive to give you quality time with your provider. You may need to reschedule your appointment if you arrive late (15 or more minutes).  Arriving late affects you and other patients whose appointments are after yours.  Also, if you miss three or more appointments without notifying the office, you may be dismissed from the clinic at the provider?s discretion.    ?  ?For prescription refill requests, have your pharmacy contact our office and allow 72 hours for refills to be completed.   ? ?Today you received the following Feraheme, return as scheduled. ?  ?To help prevent nausea and vomiting after your treatment, we encourage you to take your nausea medication as directed. ? ?BELOW ARE SYMPTOMS THAT SHOULD BE REPORTED IMMEDIATELY: ?*FEVER GREATER THAN 100.4 F (38 ?C) OR HIGHER ?*CHILLS OR SWEATING ?*NAUSEA AND VOMITING THAT IS NOT CONTROLLED WITH YOUR NAUSEA MEDICATION ?*UNUSUAL SHORTNESS OF BREATH ?*UNUSUAL BRUISING OR BLEEDING ?*URINARY PROBLEMS (pain or burning when urinating, or frequent urination) ?*BOWEL PROBLEMS (unusual diarrhea, constipation, pain near the anus) ?TENDERNESS IN MOUTH AND THROAT WITH OR WITHOUT PRESENCE OF ULCERS (sore throat, sores in mouth, or a toothache) ?UNUSUAL RASH, SWELLING OR PAIN  ?UNUSUAL VAGINAL DISCHARGE OR ITCHING  ? ?Items with * indicate a potential emergency and should be followed up as soon as possible or go to the Emergency Department if any problems should occur. ? ?Please show the CHEMOTHERAPY ALERT CARD or IMMUNOTHERAPY ALERT CARD at check-in to the Emergency Department and triage  nurse. ? ?Should you have questions after your visit or need to cancel or reschedule your appointment, please contact  [facility] [phone]  and follow the prompts.  Office hours are 8:00 a.m. to 4:30 p.m. Monday - Friday. Please note that voicemails left after 4:00 p.m. may not be returned until the following business day.  We are closed weekends and major holidays. You have access to a nurse at all times for urgent questions. Please call the main number to the clinic [phone] and follow the prompts. ? ?For any non-urgent questions, you may also contact your provider using MyChart. We now offer e-Visits for anyone 18 and older to request care online for non-urgent symptoms. For details visit [email or web address]. ?  ?Also download the MyChart app! Go to the app store, search "MyChart", open the app, select [facility], and log in with your MyChart username and password. ? ?Due to Covid, a mask is required upon entering the hospital/clinic. If you do not have a mask, one will be given to you upon arrival. For doctor visits, patients may have 1 support person aged 18 or older with them. For treatment visits, patients cannot have anyone with them due to current Covid guidelines and our immunocompromised population.  ?

## 2021-12-18 NOTE — Progress Notes (Signed)
Patient presents today for Feraheme infusion. Iron started at 1443, Patient called out at 1453 with complaints of feeling hot, pressure in chest and nausea. Iron stopped and NS bolus started, Dr. Ellin Saba reported to room and ordered Pepcid 20mg , Solu-medrol 125mg  and Benadryl 12.5. Sympotoms started to improve with bolus, medications administered with orders to restart treatment after symptoms subside. Iron restarted. ?Patient tolerated iron infusion with no complaints voiced. Peripheral IV site clean and dry with good blood return noted before and after infusion. Band aid applied. VSS with discharge and left in satisfactory condition with no s/s of distress noted.   ?

## 2021-12-26 ENCOUNTER — Inpatient Hospital Stay (HOSPITAL_COMMUNITY): Payer: 59 | Attending: Hematology

## 2021-12-26 VITALS — BP 157/85 | HR 76 | Temp 97.8°F | Resp 18

## 2021-12-26 DIAGNOSIS — D500 Iron deficiency anemia secondary to blood loss (chronic): Secondary | ICD-10-CM

## 2021-12-26 DIAGNOSIS — D509 Iron deficiency anemia, unspecified: Secondary | ICD-10-CM | POA: Diagnosis present

## 2021-12-26 MED ORDER — ACETAMINOPHEN 325 MG PO TABS
650.0000 mg | ORAL_TABLET | Freq: Once | ORAL | Status: AC
  Administered 2021-12-26: 650 mg via ORAL
  Filled 2021-12-26: qty 2

## 2021-12-26 MED ORDER — SODIUM CHLORIDE 0.9 % IV SOLN: Freq: Once | INTRAVENOUS | Status: AC

## 2021-12-26 MED ORDER — METHYLPREDNISOLONE SODIUM SUCC 125 MG IJ SOLR
125.0000 mg | Freq: Once | INTRAMUSCULAR | Status: AC
  Administered 2021-12-26: 125 mg via INTRAVENOUS
  Filled 2021-12-26: qty 2

## 2021-12-26 MED ORDER — FAMOTIDINE IN NACL 20-0.9 MG/50ML-% IV SOLN
20.0000 mg | Freq: Once | INTRAVENOUS | Status: AC
  Administered 2021-12-26: 20 mg via INTRAVENOUS
  Filled 2021-12-26: qty 50

## 2021-12-26 MED ORDER — SODIUM CHLORIDE 0.9 % IV SOLN
510.0000 mg | Freq: Once | INTRAVENOUS | Status: AC
  Administered 2021-12-26: 510 mg via INTRAVENOUS
  Filled 2021-12-26: qty 17

## 2021-12-26 MED ORDER — DIPHENHYDRAMINE HCL 50 MG/ML IJ SOLN
12.5000 mg | Freq: Once | INTRAMUSCULAR | Status: AC
  Administered 2021-12-26: 12.5 mg via INTRAVENOUS
  Filled 2021-12-26: qty 1

## 2021-12-26 NOTE — Patient Instructions (Signed)
[facility]  Discharge Instructions: Thank you for choosing [facility] to provide your oncology and hematology care.  If you have a lab appointment with the Cancer Center, please come in thru the Main Entrance and check in at the main information desk.  Wear comfortable clothing and clothing appropriate for easy access to any Portacath or PICC line.   We strive to give you quality time with your provider. You may need to reschedule your appointment if you arrive late (15 or more minutes).  Arriving late affects you and other patients whose appointments are after yours.  Also, if you miss three or more appointments without notifying the office, you may be dismissed from the clinic at the provider's discretion.      For prescription refill requests, have your pharmacy contact our office and allow 72 hours for refills to be completed.        To help prevent nausea and vomiting after your treatment, we encourage you to take your nausea medication as directed.  BELOW ARE SYMPTOMS THAT SHOULD BE REPORTED IMMEDIATELY: *FEVER GREATER THAN 100.4 F (38 C) OR HIGHER *CHILLS OR SWEATING *NAUSEA AND VOMITING THAT IS NOT CONTROLLED WITH YOUR NAUSEA MEDICATION *UNUSUAL SHORTNESS OF BREATH *UNUSUAL BRUISING OR BLEEDING *URINARY PROBLEMS (pain or burning when urinating, or frequent urination) *BOWEL PROBLEMS (unusual diarrhea, constipation, pain near the anus) TENDERNESS IN MOUTH AND THROAT WITH OR WITHOUT PRESENCE OF ULCERS (sore throat, sores in mouth, or a toothache) UNUSUAL RASH, SWELLING OR PAIN  UNUSUAL VAGINAL DISCHARGE OR ITCHING   Items with * indicate a potential emergency and should be followed up as soon as possible or go to the Emergency Department if any problems should occur.  Please show the CHEMOTHERAPY ALERT CARD or IMMUNOTHERAPY ALERT CARD at check-in to the Emergency Department and triage nurse.  Should you have questions after your visit or need to cancel  or reschedule your appointment, please contact [facility] [phone]  and follow the prompts.  Office hours are 8:00 a.m. to 4:30 p.m. Monday - Friday. Please note that voicemails left after 4:00 p.m. may not be returned until the following business day.  We are closed weekends and major holidays. You have access to a nurse at all times for urgent questions. Please call the main number to the clinic [phone] and follow the prompts.  For any non-urgent questions, you may also contact your provider using MyChart. We now offer e-Visits for anyone 18 and older to request care online for non-urgent symptoms. For details visit [email or web address].   Also download the MyChart app! Go to the app store, search "MyChart", open the app, select [facility], and log in with your MyChart username and password.  Due to Covid, a mask is required upon entering the hospital/clinic. If you do not have a mask, one will be given to you upon arrival. For doctor visits, patients may have 1 support person aged 18 or older with them. For treatment visits, patients cannot have anyone with them due to current Covid guidelines and our immunocompromised population.  

## 2021-12-26 NOTE — Progress Notes (Signed)
Patient with reaction to first dose of Feraheme but was able to complete with medication intervention.  Dr Ellin Saba wants to add premedication to plan to be given prior to Feraheme doses. ? ?Famotidine 20 mg IVPB x 1 ?Solu-Medrol 125 mg IVPush x 1 ?Tylenol 650 mg po X 1 ?Diphenhydramine 12.5 mg IVPush x 1 ? ?Orders updated to reflect the above. ? ?T.O. Dr Carilyn Goodpasture, PharmD ?

## 2021-12-26 NOTE — Progress Notes (Signed)
Patient presents today for iron infusion.  Patient is in satisfactory condition with no new complaints voiced.  Vital signs are stable.  We will proceed with treatment per MD orders.   Patient tolerated treatment well with no complaints voiced.  Patient left ambulatory in stable condition.  Vital signs stable at discharge.  Follow up as scheduled.    

## 2022-01-23 ENCOUNTER — Ambulatory Visit: Payer: 59 | Admitting: Gastroenterology

## 2022-01-23 ENCOUNTER — Other Ambulatory Visit: Payer: Self-pay

## 2022-01-23 ENCOUNTER — Encounter: Payer: Self-pay | Admitting: Gastroenterology

## 2022-01-23 VITALS — BP 160/90 | HR 69 | Temp 97.5°F | Ht 61.0 in | Wt 184.8 lb

## 2022-01-23 DIAGNOSIS — R1013 Epigastric pain: Secondary | ICD-10-CM | POA: Diagnosis not present

## 2022-01-23 DIAGNOSIS — D509 Iron deficiency anemia, unspecified: Secondary | ICD-10-CM | POA: Diagnosis not present

## 2022-01-23 DIAGNOSIS — K219 Gastro-esophageal reflux disease without esophagitis: Secondary | ICD-10-CM

## 2022-01-23 NOTE — Patient Instructions (Addendum)
You can stop sucralfate (Carafate) if you don't see any improvement in your reflux symptoms.  Continue Dexilant in the mornings before breakfast. Continue famotidine (Pepcid) in the evenings.  Upper endoscopy to be scheduled.  We will get copy of your colonoscopy and pathology reports and make note in our recall system to remind you when you are due again.

## 2022-01-23 NOTE — Progress Notes (Signed)
GI Office Note    Referring Provider: [facility] Primary Care Physician:  [facility]  Primary Gastroenterologist: Hennie Duos. Marletta Lor, DO   Chief Complaint   Chief Complaint  Patient presents with   Gastroesophageal Reflux    History of Present Illness   Suzanne Lawrence is a 51 y.o. female presenting today for consideration of upper endoscopy at the request of [facility]/Dr. Mitzi Hansen.  She was seen in September 2022 as a new patient for evaluation for reflux.  At that time she had recently been diagnosed with DVT of the left upper extremity was started on Eliquis. DVT occurred after shoulder surgery.  April 2022 her hemoglobin was 11.9, MCV 73.7.  at time of last visit, reflux had improved on Dexilant daily, Pepcid at night.  Given need for ongoing anticoagulation, upper GI series was done which was negative.  Since then, she developed IDA in the setting of heavy menses. She was seeing hematology/oncology at [facility] but recently switched to [facility] due to her insurance. It was felt that her IDA was likely due to menstrual blood loss but hematology wanted upper endoscopy as well.  She did have recent colonoscopy in 2022 by Dr. Arlyn Dunning.  Noted to have significant polyps.  Family history positive for colon cancer in her father.  Upper endoscopy recommended converse with consideration for germline [facility] testing based on EGD findings.  Labs from February 2023: Iron 15, iron saturation is 4%, ferritin 7, TIBC 424, B12 584, folate 6.2, hemoglobin 7.9, MCV 64, white blood cell count 8900, platelets 339,000.,  Glucose 106, creatinine 0.72, total bilirubin less than 0.2, alkaline phosphatase 95, AST 16, ALT 17, albumin 3.9.  Labs from March 2023: Ferritin 5.6, iron 20, TIBC 501, hemoglobin 8.4, MCV 63.4.  Patient underwent endometrial ablation at the end of March 2023.  Denies having menstrual cycle since that time.  Since switching to  [facility], she has received 2 iron infusions. She goes back in 02/2022 for labs.   She completed one year follow up colonoscopy in 09/2021 as outlined below. We have requested copy of path so we can advise when her next colonoscopy should be as she will require future colonoscopy at [facility] due to insurance.  As far as reflux: denies heartburn, when she lays down she still hears all the gurgling and noise in her upper abdomen and chest. Worse at night. Regurgitation. No dysphagia. No nausea/vomiting. Some lower abdominal pain and epigastric pain, feels like gas. Sometimes goes into the back. She has gained 20 pounds in the last 9 months. BM are loose every day. Notes after meals. Thinks its the metformin which she started several weeks ago. No longer on Eliquis. She is scheduled for left shoulder surgery again 02/14/22.     Colonoscopy January 2022 (Dr. Arlyn Dunning)                                                              Impression:            A digital rectal exam was performed.                         - One 13 mm polyp in the distal transverse  colon,                         removed with a hot snare. Resected and retrieved.                         - One 4 mm polyp in the descending colon, removed with                         a cold biopsy forceps. Resected and retrieved.                         - One 6 mm polyp at 50 cm proximal to the anus,                         removed with a cold biopsy forceps. Resected and                         retrieved.                         - One 9 mm polyp at 40 cm proximal to the anus,                         removed with a hot snare. Resected and retrieved.                         - One 7 mm polyp at 25 cm proximal to the anus,                         removed with a hot snare. Resected and retrieved.                         - Two 5 mm polyps at 24 cm proximal to the anus,                         removed with a cold biopsy forceps. Resected and                          retrieved.  Recommendation:        - Patient has a contact number available for                         emergencies. The signs and symptoms of potential                         delayed complications were discussed with the patient.                         Return to normal activities tomorrow. Written                         discharge instructions were provided to the patient.                         - Resume previous diet today.                         -  Await pathology results.                         - Repeat colonoscopy in 1 year for surveillance based                         on pathology results.                                                                   Colonoscopy February 2023 (Dr. Rozell Searing):      A digital rectal exam was performed                         - Two 3 to 4 mm polyps in the sigmoid colon and at 20                         cm proximal to the anus, removed with a cold biopsy                         forceps. Resected and retrieved.                         - Two 3 to 4 mm polyps in the descending colon,                         removed with a cold biopsy forceps. Resected and                         retrieved.                         - One 4 mm polyp in the descending colon, removed with                         a cold biopsy forceps. Resected and retrieved.                         - Four 3 to 4 mm polyps in the sigmoid colon, removed                         with a cold biopsy forceps. Resected and retrieved.                         - Three 2 to 4 mm polyps at the recto-sigmoid colon,                         removed with a cold biopsy forceps. Resected and                         retrieved.  Recommendation:        - Patient has a contact number available for  emergencies. The signs and symptoms of potential                         delayed complications were discussed with the patient.                         Return  to normal activities tomorrow. Written                         discharge instructions were provided to the patient.                         - Resume previous diet today.                         - Await pathology results.                         - Return to my office in 2 weeks.    Medications   Current Outpatient Medications  Medication Sig Dispense Refill   atenolol (TENORMIN) 50 MG tablet Take 50 mg by mouth daily.     atorvastatin (LIPITOR) 10 MG tablet Take 10 mg by mouth daily.     chlorthalidone (HYGROTON) 25 MG tablet Take 25 mg by mouth every morning.     dexlansoprazole (DEXILANT) 60 MG capsule Take 1 capsule by mouth daily before breakfast.     DULoxetine (CYMBALTA) 60 MG capsule Take 60 mg by mouth daily.     famotidine (PEPCID) 40 MG tablet Take 40 mg by mouth daily.     gabapentin (NEURONTIN) 300 MG capsule Take 300 mg by mouth at bedtime.     lisinopril (ZESTRIL) 40 MG tablet Take 40 mg by mouth daily.     sucralfate (CARAFATE) 1 g tablet Take 1 g by mouth 4 (four) times daily.     traZODone (DESYREL) 50 MG tablet Take by mouth at bedtime as needed.      metFORMIN (GLUCOPHAGE-XR) 500 MG 24 hr tablet Take 500 mg by mouth 2 (two) times daily before a meal.     No current facility-administered medications for this visit.    Allergies   Allergies as of 01/23/2022 - Review Complete 01/23/2022  Allergen Reaction Noted   Ferumoxytol Other (See Comments) 12/18/2021   Hydrochlorothiazide  04/02/2017     Past Medical History   Past Medical History:  Diagnosis Date   Abnormal Pap smear of cervix    Anxiety    Carpal tunnel syndrome    right   Depression    DVT (deep venous thrombosis) (HCC) 03/2021   left arm   GERD (gastroesophageal reflux disease)    High blood pressure    Left breast mass     Past Surgical History   Past Surgical History:  Procedure Laterality Date   COLONOSCOPY  2022   Arlyn Dunning: several polyps removed, has to return in one year    left shoulder arthroscopy  12/2020   TUBAL LIGATION      Past Family History   Family History  Problem Relation Age of Onset   Lung cancer Mother    Other Father        multiple issues, doesn't know history for sure   Prostate cancer Father    Stroke Father    Aneurysm Paternal Grandmother  Breast cancer Maternal Aunt    Prostate cancer Maternal Uncle    Alcoholism Maternal Uncle    Aneurysm Cousin        paternal side   Aneurysm Cousin        paternal side    Breast cancer Cousin        maternal side    High blood pressure Other        father's side    Cerebral aneurysm Other        cerebral, on father's side    Alcoholism Other        maternal side    Colon cancer Neg Hx     Past Social History   Social History   Socioeconomic History   Marital status: Single    Spouse name: Not on file   Number of children: 3   Years of education: Not on file   Highest education level: Some college, no degree  Occupational History   Not on file  Tobacco Use   Smoking status: Every Day    Packs/day: 0.50    Types: Cigarettes   Smokeless tobacco: Never  Vaping Use   Vaping Use: Never used  Substance and Sexual Activity   Alcohol use: Not Currently   Drug use: Not Currently   Sexual activity: Yes    Birth control/protection: None  Other Topics Concern   Not on file  Social History Narrative   Lives at home with 2 daughters   Right handed   Caffeine: about 2-3 per day    Social Determinants of Health   Financial Resource Strain: Not on file  Food Insecurity: Not on file  Transportation Needs: Not on file  Physical Activity: Not on file  Stress: Not on file  Social Connections: Not on file  Intimate Partner Violence: Not on file    Review of Systems   General: Negative for anorexia, weight loss, fever, chills, fatigue, weakness. ENT: Negative for hoarseness, difficulty swallowing , nasal congestion. CV: Negative for chest pain, angina, palpitations,  dyspnea on exertion, peripheral edema.  Respiratory: Negative for dyspnea at rest, dyspnea on exertion, cough, sputum, wheezing.  GI: See history of present illness. GU:  Negative for dysuria, hematuria, urinary incontinence, urinary frequency, nocturnal urination.  Endo: Negative for unusual weight change.     Physical Exam   BP (!) 160/90 (BP Location: Right Arm, Patient Position: Sitting, Cuff Size: Normal)   Pulse 69   Temp (!) 97.5 F (36.4 C) (Temporal)   Ht 5\' 1"  (1.549 m)   Wt 184 lb 12.8 oz (83.8 kg)   SpO2 98%   BMI 34.92 kg/m    General: Well-nourished, well-developed in no acute distress.  Eyes: No icterus. Mouth: Oropharyngeal mucosa moist and pink , no lesions erythema or exudate. Lungs: Clear to auscultation bilaterally.  Heart: Regular rate and rhythm, no murmurs rubs or gallops.  Abdomen: Bowel sounds are normal, nontender, no hepatosplenomegaly or masses,  no abdominal bruits, no rebound or guarding. Upper abdomen protrudes. Small umb hernia, easily reducible/nontender Rectal: not performed Extremities: No lower extremity edema. No clubbing or deformities. Neuro: Alert and oriented x 4   Skin: Warm and dry, no jaundice.   Psych: Alert and cooperative, normal mood and affect.  Labs   See hpi  Imaging Studies   No results found.  Assessment   GERD: typical reflux well controlled. Has been on reflux medications >5 years. Complains of regurgitation, gurgling/noise abdomen worse at night, epigastric pain. Likely  exacerbated by weight gain. She is on Dexilant and Pepcid. Takes carafate a couple of times per day but not sure it is beneficial. Recommend upper endoscopy. Oncology previously desired EGD for IDA and due to substantial number of colon polyps on FH of colon cancer. If upper GI polyps noted, oncology considering genetic testing.   IDA: requiring iron infusions.  Hgb notably dropped over the past one year after being placed on Eliquis. She was also noted  to have worsening menstrual losses although reported heavy menses prior to Eliquis. Completed hysteroscopy and endometrial ablation two months ago. Colonoscopy up to date. Agree with upper endoscopy to further evaluate IDA as well.    PLAN   Stop Carafate as it does not seem to be helping.  Continue Dexilant once daily before breakfast. Continue famotidine at bedtime. Obtain copy of colonoscopy path to determine timing of next colonoscopy. Upper endoscopy with Dr. Marletta Lor. ASA 2.  I have discussed the risks, alternatives, benefits with regards to but not limited to the risk of reaction to medication, bleeding, infection, perforation and the patient is agreeable to proceed. Written consent to be obtained.    Leanna Battles. Melvyn Neth, MHS, PA-C [facility]

## 2022-01-23 NOTE — H&P (View-Only) (Signed)
   GI Office Note    Referring Provider: [facility] Primary Care Physician:  [facility]  Primary Gastroenterologist: Charles K. Carver, DO   Chief Complaint   Chief Complaint  Patient presents with   Gastroesophageal Reflux    History of Present Illness   Suzanne Lawrence is a 51 y.o. female presenting today for consideration of upper endoscopy at the request of [facility]/Dr. Gordon Williams.  She was seen in September 2022 as a new patient for evaluation for reflux.  At that time she had recently been diagnosed with DVT of the left upper extremity was started on Eliquis. DVT occurred after shoulder surgery.  April 2022 her hemoglobin was 11.9, MCV 73.7.  at time of last visit, reflux had improved on Dexilant daily, Pepcid at night.  Given need for ongoing anticoagulation, upper GI series was done which was negative.  Since then, she developed IDA in the setting of heavy menses. She was seeing hematology/oncology at [facility] but recently switched to  [facility]ospital due to her insurance. It was felt that her IDA was likely due to menstrual blood loss but hematology wanted upper endoscopy as well.  She did have recent colonoscopy in 2022 by Dr. Megan Straughan.  Noted to have significant polyps.  Family history positive for colon cancer in her father.  Upper endoscopy recommended converse with consideration for germline [facility] testing based on EGD findings.  Labs from February 2023: Iron 15, iron saturation is 4%, ferritin 7, TIBC 424, B12 584, folate 6.2, hemoglobin 7.9, MCV 64, white blood cell count 8900, platelets 339,000.,  Glucose 106, creatinine 0.72, total bilirubin less than 0.2, alkaline phosphatase 95, AST 16, ALT 17, albumin 3.9.  Labs from March 2023: Ferritin 5.6, iron 20, TIBC 501, hemoglobin 8.4, MCV 63.4.  Patient underwent endometrial ablation at the end of March 2023.  Denies having menstrual cycle since that time.  Since switching to  [facility], she has received 2 iron infusions. She goes back in 02/2022 for labs.   She completed one year follow up colonoscopy in 09/2021 as outlined below. We have requested copy of path so we can advise when her next colonoscopy should be as she will require future colonoscopy at [place] due to insurance.  As far as reflux: denies heartburn, when she lays down she still hears all the gurgling and noise in her upper abdomen and chest. Worse at night. Regurgitation. No dysphagia. No nausea/vomiting. Some lower abdominal pain and epigastric pain, feels like gas. Sometimes goes into the back. She has gained 20 pounds in the last 9 months. BM are loose every day. Notes after meals. Thinks its the metformin which she started several weeks ago. No longer on Eliquis. She is scheduled for left shoulder surgery again 02/14/22.     Colonoscopy January 2022 (Dr. Megan Straughan)                                                              Impression:            A digital rectal exam was performed.                         - One 13 mm polyp in the distal transverse   colon,                         removed with a hot snare. Resected and retrieved.                         - One 4 mm polyp in the descending colon, removed with                         a cold biopsy forceps. Resected and retrieved.                         - One 6 mm polyp at 50 cm proximal to the anus,                         removed with a cold biopsy forceps. Resected and                         retrieved.                         - One 9 mm polyp at 40 cm proximal to the anus,                         removed with a hot snare. Resected and retrieved.                         - One 7 mm polyp at 25 cm proximal to the anus,                         removed with a hot snare. Resected and retrieved.                         - Two 5 mm polyps at 24 cm proximal to the anus,                         removed with a cold biopsy forceps. Resected and                          retrieved.  Recommendation:        - Patient has a contact number available for                         emergencies. The signs and symptoms of potential                         delayed complications were discussed with the patient.                         Return to normal activities tomorrow. Written                         discharge instructions were provided to the patient.                         - Resume previous diet today.                         -  Await pathology results.                         - Repeat colonoscopy in 1 year for surveillance based                         on pathology results.                                                                   Colonoscopy February 2023 (Dr. Rozell Searing):      A digital rectal exam was performed                         - Two 3 to 4 mm polyps in the sigmoid colon and at 20                         cm proximal to the anus, removed with a cold biopsy                         forceps. Resected and retrieved.                         - Two 3 to 4 mm polyps in the descending colon,                         removed with a cold biopsy forceps. Resected and                         retrieved.                         - One 4 mm polyp in the descending colon, removed with                         a cold biopsy forceps. Resected and retrieved.                         - Four 3 to 4 mm polyps in the sigmoid colon, removed                         with a cold biopsy forceps. Resected and retrieved.                         - Three 2 to 4 mm polyps at the recto-sigmoid colon,                         removed with a cold biopsy forceps. Resected and                         retrieved.  Recommendation:        - Patient has a contact number available for  emergencies. The signs and symptoms of potential                         delayed complications were discussed with the patient.                         Return  to normal activities tomorrow. Written                         discharge instructions were provided to the patient.                         - Resume previous diet today.                         - Await pathology results.                         - Return to my office in 2 weeks.    Medications   Current Outpatient Medications  Medication Sig Dispense Refill   atenolol (TENORMIN) 50 MG tablet Take 50 mg by mouth daily.     atorvastatin (LIPITOR) 10 MG tablet Take 10 mg by mouth daily.     chlorthalidone (HYGROTON) 25 MG tablet Take 25 mg by mouth every morning.     dexlansoprazole (DEXILANT) 60 MG capsule Take 1 capsule by mouth daily before breakfast.     DULoxetine (CYMBALTA) 60 MG capsule Take 60 mg by mouth daily.     famotidine (PEPCID) 40 MG tablet Take 40 mg by mouth daily.     gabapentin (NEURONTIN) 300 MG capsule Take 300 mg by mouth at bedtime.     lisinopril (ZESTRIL) 40 MG tablet Take 40 mg by mouth daily.     sucralfate (CARAFATE) 1 g tablet Take 1 g by mouth 4 (four) times daily.     traZODone (DESYREL) 50 MG tablet Take by mouth at bedtime as needed.      metFORMIN (GLUCOPHAGE-XR) 500 MG 24 hr tablet Take 500 mg by mouth 2 (two) times daily before a meal.     No current facility-administered medications for this visit.    Allergies   Allergies as of 01/23/2022 - Review Complete 01/23/2022  Allergen Reaction Noted   Ferumoxytol Other (See Comments) 12/18/2021   Hydrochlorothiazide  04/02/2017     Past Medical History   Past Medical History:  Diagnosis Date   Abnormal Pap smear of cervix    Anxiety    Carpal tunnel syndrome    right   Depression    DVT (deep venous thrombosis) (HCC) 03/2021   left arm   GERD (gastroesophageal reflux disease)    High blood pressure    Left breast mass     Past Surgical History   Past Surgical History:  Procedure Laterality Date   COLONOSCOPY  2022   Megan Straughan: several polyps removed, has to return in one year    left shoulder arthroscopy  12/2020   TUBAL LIGATION      Past Family History   Family History  Problem Relation Age of Onset   Lung cancer Mother    Other Father        multiple issues, doesn't know history for sure   Prostate cancer Father    Stroke Father    Aneurysm Paternal Grandmother      Breast cancer Maternal Aunt    Prostate cancer Maternal Uncle    Alcoholism Maternal Uncle    Aneurysm Cousin        paternal side   Aneurysm Cousin        paternal side    Breast cancer Cousin        maternal side    High blood pressure Other        father's side    Cerebral aneurysm Other        cerebral, on father's side    Alcoholism Other        maternal side    Colon cancer Neg Hx     Past Social History   Social History   Socioeconomic History   Marital status: Single    Spouse name: Not on file   Number of children: 3   Years of education: Not on file   Highest education level: Some college, no degree  Occupational History   Not on file  Tobacco Use   Smoking status: Every Day    Packs/day: 0.50    Types: Cigarettes   Smokeless tobacco: Never  Vaping Use   Vaping Use: Never used  Substance and Sexual Activity   Alcohol use: Not Currently   Drug use: Not Currently   Sexual activity: Yes    Birth control/protection: None  Other Topics Concern   Not on file  Social History Narrative   Lives at home with 2 daughters   Right handed   Caffeine: about 2-3 per day    Social Determinants of Health   Financial Resource Strain: Not on file  Food Insecurity: Not on file  Transportation Needs: Not on file  Physical Activity: Not on file  Stress: Not on file  Social Connections: Not on file  Intimate Partner Violence: Not on file    Review of Systems   General: Negative for anorexia, weight loss, fever, chills, fatigue, weakness. ENT: Negative for hoarseness, difficulty swallowing , nasal congestion. CV: Negative for chest pain, angina, palpitations,  dyspnea on exertion, peripheral edema.  Respiratory: Negative for dyspnea at rest, dyspnea on exertion, cough, sputum, wheezing.  GI: See history of present illness. GU:  Negative for dysuria, hematuria, urinary incontinence, urinary frequency, nocturnal urination.  Endo: Negative for unusual weight change.     Physical Exam   BP (!) 160/90 (BP Location: Right Arm, Patient Position: Sitting, Cuff Size: Normal)   Pulse 69   Temp (!) 97.5 F (36.4 C) (Temporal)   Ht 5\' 1"  (1.549 m)   Wt 184 lb 12.8 oz (83.8 kg)   SpO2 98%   BMI 34.92 kg/m    General: Well-nourished, well-developed in no acute distress.  Eyes: No icterus. Mouth: Oropharyngeal mucosa moist and pink , no lesions erythema or exudate. Lungs: Clear to auscultation bilaterally.  Heart: Regular rate and rhythm, no murmurs rubs or gallops.  Abdomen: Bowel sounds are normal, nontender, no hepatosplenomegaly or masses,  no abdominal bruits, no rebound or guarding. Upper abdomen protrudes. Small umb hernia, easily reducible/nontender Rectal: not performed Extremities: No lower extremity edema. No clubbing or deformities. Neuro: Alert and oriented x 4   Skin: Warm and dry, no jaundice.   Psych: Alert and cooperative, normal mood and affect.  Labs   See hpi  Imaging Studies   No results found.  Assessment   GERD: typical reflux well controlled. Has been on reflux medications >5 years. Complains of regurgitation, gurgling/noise abdomen worse at night, epigastric pain. Likely  exacerbated by weight gain. She is on Dexilant and Pepcid. Takes carafate a couple of times per day but not sure it is beneficial. Recommend upper endoscopy. Oncology previously desired EGD for IDA and due to substantial number of colon polyps on FH of colon cancer. If upper GI polyps noted, oncology considering genetic testing.   IDA: requiring iron infusions.  Hgb notably dropped over the past one year after being placed on Eliquis. She was also noted  to have worsening menstrual losses although reported heavy menses prior to Eliquis. Completed hysteroscopy and endometrial ablation two months ago. Colonoscopy up to date. Agree with upper endoscopy to further evaluate IDA as well.    PLAN   Stop Carafate as it does not seem to be helping.  Continue Dexilant once daily before breakfast. Continue famotidine at bedtime. Obtain copy of colonoscopy path to determine timing of next colonoscopy. Upper endoscopy with Dr. Marletta Lor. ASA 2.  I have discussed the risks, alternatives, benefits with regards to but not limited to the risk of reaction to medication, bleeding, infection, perforation and the patient is agreeable to proceed. Written consent to be obtained.    Leanna Battles. Melvyn Neth, MHS, PA-C [facility]

## 2022-02-01 ENCOUNTER — Other Ambulatory Visit (HOSPITAL_COMMUNITY)
Admission: RE | Admit: 2022-02-01 | Discharge: 2022-02-01 | Disposition: A | Discharge status: Home / Self Care | Payer: 59 | Source: Ambulatory Visit | Attending: Internal Medicine | Admitting: Internal Medicine

## 2022-02-01 DIAGNOSIS — R1013 Epigastric pain: Secondary | ICD-10-CM | POA: Diagnosis present

## 2022-02-01 DIAGNOSIS — D509 Iron deficiency anemia, unspecified: Secondary | ICD-10-CM | POA: Insufficient documentation

## 2022-02-01 LAB — BASIC METABOLIC PANEL
Anion gap: 7 (ref 5–15)
BUN: 11 mg/dL (ref 6–20)
CO2: 26 mmol/L (ref 22–32)
Calcium: 9.1 mg/dL (ref 8.9–10.3)
Chloride: 102 mmol/L (ref 98–111)
Creatinine, Ser: 0.73 mg/dL (ref 0.44–1.00)
GFR, Estimated: >60 mL/min (ref >60)
Glucose, Bld: 171 mg/dL — ABNORMAL HIGH (ref 70–99)
Potassium: 3.5 mmol/L (ref 3.5–5.1)
Sodium: 135 mmol/L (ref 135–145)

## 2022-02-01 LAB — PREGNANCY, URINE: Preg Test, Ur: NEGATIVE

## 2022-02-05 ENCOUNTER — Other Ambulatory Visit: Payer: Self-pay

## 2022-02-05 ENCOUNTER — Encounter (HOSPITAL_COMMUNITY)
Admission: RE | Disposition: A | Discharge status: Home / Self Care | Payer: Self-pay | Source: Home / Self Care | Attending: Internal Medicine

## 2022-02-05 ENCOUNTER — Ambulatory Visit (HOSPITAL_BASED_OUTPATIENT_CLINIC_OR_DEPARTMENT_OTHER): Payer: 59 | Admitting: Anesthesiology

## 2022-02-05 ENCOUNTER — Ambulatory Visit (HOSPITAL_COMMUNITY): Payer: 59 | Admitting: Anesthesiology

## 2022-02-05 ENCOUNTER — Ambulatory Visit (HOSPITAL_COMMUNITY)
Admission: RE | Admit: 2022-02-05 | Discharge: 2022-02-05 | Disposition: A | Discharge status: Home / Self Care | Payer: 59 | Attending: Internal Medicine | Admitting: Internal Medicine

## 2022-02-05 ENCOUNTER — Encounter (HOSPITAL_COMMUNITY): Payer: Self-pay

## 2022-02-05 DIAGNOSIS — K2970 Gastritis, unspecified, without bleeding: Secondary | ICD-10-CM

## 2022-02-05 DIAGNOSIS — R12 Heartburn: Secondary | ICD-10-CM | POA: Diagnosis not present

## 2022-02-05 DIAGNOSIS — D509 Iron deficiency anemia, unspecified: Secondary | ICD-10-CM | POA: Diagnosis present

## 2022-02-05 DIAGNOSIS — I10 Essential (primary) hypertension: Secondary | ICD-10-CM | POA: Insufficient documentation

## 2022-02-05 DIAGNOSIS — Z7901 Long term (current) use of anticoagulants: Secondary | ICD-10-CM | POA: Insufficient documentation

## 2022-02-05 DIAGNOSIS — F419 Anxiety disorder, unspecified: Secondary | ICD-10-CM | POA: Insufficient documentation

## 2022-02-05 DIAGNOSIS — Z79899 Other long term (current) drug therapy: Secondary | ICD-10-CM | POA: Diagnosis not present

## 2022-02-05 DIAGNOSIS — K219 Gastro-esophageal reflux disease without esophagitis: Secondary | ICD-10-CM | POA: Insufficient documentation

## 2022-02-05 DIAGNOSIS — K449 Diaphragmatic hernia without obstruction or gangrene: Secondary | ICD-10-CM

## 2022-02-05 DIAGNOSIS — F17210 Nicotine dependence, cigarettes, uncomplicated: Secondary | ICD-10-CM | POA: Diagnosis not present

## 2022-02-05 DIAGNOSIS — N920 Excessive and frequent menstruation with regular cycle: Secondary | ICD-10-CM | POA: Diagnosis not present

## 2022-02-05 DIAGNOSIS — Z86010 Personal history of colonic polyps: Secondary | ICD-10-CM | POA: Insufficient documentation

## 2022-02-05 DIAGNOSIS — K317 Polyp of stomach and duodenum: Secondary | ICD-10-CM | POA: Insufficient documentation

## 2022-02-05 DIAGNOSIS — Z800 Family history of malignant neoplasm of digestive organs: Secondary | ICD-10-CM | POA: Diagnosis not present

## 2022-02-05 DIAGNOSIS — F32A Depression, unspecified: Secondary | ICD-10-CM | POA: Diagnosis not present

## 2022-02-05 DIAGNOSIS — Z86718 Personal history of other venous thrombosis and embolism: Secondary | ICD-10-CM | POA: Insufficient documentation

## 2022-02-05 HISTORY — PX: BIOPSY: SHX5522

## 2022-02-05 HISTORY — PX: ESOPHAGOGASTRODUODENOSCOPY (EGD) WITH PROPOFOL: SHX5813

## 2022-02-05 LAB — GLUCOSE, CAPILLARY: Glucose-Capillary: 130 mg/dL — ABNORMAL HIGH (ref 70–99)

## 2022-02-05 SURGERY — ESOPHAGOGASTRODUODENOSCOPY (EGD) WITH PROPOFOL
Anesthesia: General

## 2022-02-05 MED ORDER — LACTATED RINGERS IV SOLN
INTRAVENOUS | Status: DC
Start: 2022-02-05 — End: 2022-02-05

## 2022-02-05 MED ORDER — LACTATED RINGERS IV SOLN: INTRAVENOUS | Status: DC

## 2022-02-05 MED ORDER — LIDOCAINE HCL (CARDIAC) PF 100 MG/5ML IV SOSY
PREFILLED_SYRINGE | INTRAVENOUS | Status: DC | PRN
  Administered 2022-02-05: 50 mg via INTRAVENOUS

## 2022-02-05 MED ORDER — PROPOFOL 10 MG/ML IV BOLUS
INTRAVENOUS | Status: DC | PRN
  Administered 2022-02-05: 50 mg via INTRAVENOUS
  Administered 2022-02-05: 150 mg via INTRAVENOUS
  Administered 2022-02-05: 50 mg via INTRAVENOUS

## 2022-02-05 NOTE — Discharge Instructions (Addendum)
EGD Discharge instructions Please read the instructions outlined below and refer to this sheet in the next few weeks. These discharge instructions provide you with general information on caring for yourself after you leave the hospital. Your doctor may also give you specific instructions. While your treatment has been planned according to the most current medical practices available, unavoidable complications occasionally occur. If you have any problems or questions after discharge, please call your doctor. ACTIVITY You may resume your regular activity but move at a slower pace for the next 24 hours.  Take frequent rest periods for the next 24 hours.  Walking will help expel (get rid of) the air and reduce the bloated feeling in your abdomen.  No driving for 24 hours (because of the anesthesia (medicine) used during the test).  You may shower.  Do not sign any important legal documents or operate any machinery for 24 hours (because of the anesthesia used during the test).  NUTRITION Drink plenty of fluids.  You may resume your normal diet.  Begin with a light meal and progress to your normal diet.  Avoid alcoholic beverages for 24 hours or as instructed by your caregiver.  MEDICATIONS You may resume your normal medications unless your caregiver tells you otherwise.  WHAT YOU CAN EXPECT TODAY You may experience abdominal discomfort such as a feeling of fullness or "gas" pains.  FOLLOW-UP Your doctor will discuss the results of your test with you.  SEEK IMMEDIATE MEDICAL ATTENTION IF ANY OF THE FOLLOWING OCCUR: Excessive nausea (feeling sick to your stomach) and/or vomiting.  Severe abdominal pain and distention (swelling).  Trouble swallowing.  Temperature over 101 F (37.8 C).  Rectal bleeding or vomiting of blood.    Your EGD revealed mild amount inflammation in your stomach.  I took biopsies of this to rule out infection with a bacteria called H. pylori.  Await pathology results, my  office will contact you.  You also had a few very small polyps in your stomach.  Likely benign, nothing to worry about.  I did sample these as well.  Esophagus appeared normal besides small hiatal hernia.  Small bowel appeared normal.  Continue on current medications.  Follow-up with GI in 3 to 4 months.   I hope you have a great rest of your week!  Hennie Duos. Marletta Lor, D.O. Gastroenterology and Hepatology [facility]

## 2022-02-05 NOTE — Interval H&P Note (Signed)
History and Physical Interval Note:  02/05/2022 9:21 AM  Suzanne Lawrence  has presented today for surgery, with the diagnosis of GERD, iron deficiency anemia.  The various methods of treatment have been discussed with the patient and family. After consideration of risks, benefits and other options for treatment, the patient has consented to  Procedure(s) with comments: ESOPHAGOGASTRODUODENOSCOPY (EGD) WITH PROPOFOL (N/A) - 2:00pm as a surgical intervention.  The patient's history has been reviewed, patient examined, no change in status, stable for surgery.  I have reviewed the patient's chart and labs.  Questions were answered to the patient's satisfaction.     Lanelle Bal

## 2022-02-05 NOTE — Anesthesia Postprocedure Evaluation (Signed)
Anesthesia Post Note  Patient: Suzanne Lawrence  Procedure(s) Performed: ESOPHAGOGASTRODUODENOSCOPY (EGD) WITH PROPOFOL BIOPSY  Patient location during evaluation: Phase II Anesthesia Type: General Level of consciousness: awake Pain management: pain level controlled Vital Signs Assessment: post-procedure vital signs reviewed and stable Respiratory status: spontaneous breathing and respiratory function stable Cardiovascular status: blood pressure returned to baseline and stable Postop Assessment: no headache and no apparent nausea or vomiting Anesthetic complications: no Comments: Late entry   No notable events documented.   Last Vitals:  Vitals:   02/05/22 0954 02/05/22 1001  BP: (!) 92/51 (!) 153/88  Pulse: 84   Resp: (!) 26   Temp: 36.5 C   SpO2: 94%     Last Pain:  Vitals:   02/05/22 1001  TempSrc:   PainSc: 0-No pain                 Windell Norfolk

## 2022-02-05 NOTE — Transfer of Care (Signed)
Immediate Anesthesia Transfer of Care Note  Patient: Suzanne Lawrence  Procedure(s) Performed: ESOPHAGOGASTRODUODENOSCOPY (EGD) WITH PROPOFOL BIOPSY  Patient Location: Endoscopy Unit  Anesthesia Type:General  Level of Consciousness: drowsy  Airway & Oxygen Therapy: Patient Spontanous Breathing  Post-op Assessment: Report given to RN and Post -op Vital signs reviewed and stable  Post vital signs: Reviewed and stable  Last Vitals:  Vitals Value Taken Time  BP    Temp    Pulse    Resp    SpO2      Last Pain:  Vitals:   02/05/22 0857  TempSrc: Oral  PainSc: 0-No pain      Patients Stated Pain Goal: 5 ([id number])  Complications: No notable events documented.

## 2022-02-05 NOTE — Anesthesia Procedure Notes (Signed)
Date/Time: 02/05/2022 9:44 AM  Performed by: Julian Reil, CRNAPre-anesthesia Checklist: Patient identified, Emergency Drugs available, Suction available and Patient being monitored Patient Re-evaluated:Patient Re-evaluated prior to induction Oxygen Delivery Method: Nasal cannula Induction Type: IV induction Placement Confirmation: positive ETCO2

## 2022-02-05 NOTE — Op Note (Signed)
[facility] Patient Name: Suzanne Lawrence Procedure Date: 02/05/2022 9:32 AM MRN: [id number] Date of Birth: 1971-02-15 Attending MD: Elon Alas. Edgar Frisk CSN: [id number] Age: 51 Admit Type: Outpatient Procedure:                Upper GI endoscopy Indications:              Iron deficiency anemia, Heartburn Providers:                Elon Alas. Abbey Chatters, DO, Janeece Riggers, RN, Raphael Gibney, Technician Referring MD:              Medicines:                See the Anesthesia note for documentation of the                            administered medications Complications:            No immediate complications. Estimated Blood Loss:     Estimated blood loss was minimal. Procedure:                Pre-Anesthesia Assessment:                           - The anesthesia plan was to use monitored                            anesthesia care (MAC).                           After obtaining informed consent, the endoscope was                            passed under direct vision. Throughout the                            procedure, the patient's blood pressure, pulse, and                            oxygen saturations were monitored continuously. The                            [id number]-H190 ([id number]) scope was introduced through the                            mouth, and advanced to the second part of duodenum.                            The upper GI endoscopy was accomplished without                            difficulty. The patient tolerated the procedure                            well. Scope In: 9:44:58  AM Scope Out: 9:52:00 AM Total Procedure Duration: 0 hours 7 minutes 2 seconds  Findings:      The Z-line was regular and was found 37 cm from the incisors.      A small hiatal hernia was present.      There is no endoscopic evidence of areas of erosion, esophagitis,       ulcerations or varices in the entire esophagus.      Localized mild inflammation characterized by  erythema was found in the       gastric antrum. Biopsies were taken with a cold forceps for Helicobacter       pylori testing.      Three small sessile polyps with no bleeding and no stigmata of recent       bleeding were found in the gastric body. Biopsies were taken with a cold       forceps for histology.      The duodenal bulb, first portion of the duodenum and second portion of       the duodenum were normal. Impression:               - Z-line regular, 37 cm from the incisors.                           - Small hiatal hernia.                           - Gastritis. Biopsied.                           - Three gastric polyps. Biopsied.                           - Normal duodenal bulb, first portion of the                            duodenum and second portion of the duodenum. Moderate Sedation:      Per Anesthesia Care Recommendation:           - Patient has a contact number available for                            emergencies. The signs and symptoms of potential                            delayed complications were discussed with the                            patient. Return to normal activities tomorrow.                            Written discharge instructions were provided to the                            patient.                           - Resume previous diet.                           -  Continue present medications.                           - Await pathology results.                           - Use Dexilant (dexlansoprazole) 60 mg PO daily.                           - Return to GI clinic in 4 months. Procedure Code(s):        --- Professional ---                           [id number], Esophagogastroduodenoscopy, flexible,                            transoral; with biopsy, single or multiple Diagnosis Code(s):        --- Professional ---                           K44.9, Diaphragmatic hernia without obstruction or                            gangrene                            K29.70, Gastritis, unspecified, without bleeding                           K31.7, Polyp of stomach and duodenum                           D50.9, Iron deficiency anemia, unspecified                           R12, Heartburn CPT copyright 2019 American Medical Association. All rights reserved. The codes documented in this report are preliminary and upon coder review may  be revised to meet current compliance requirements. Elon Alas. Abbey Chatters, DO [place] Abbey Chatters, DO 02/05/2022 9:55:16 AM This report has been signed electronically. Number of Addenda: 0

## 2022-02-05 NOTE — Addendum Note (Signed)
Addendum  created 02/05/22 1023 by Julian Reil, CRNA   Flowsheet accepted

## 2022-02-05 NOTE — Anesthesia Preprocedure Evaluation (Signed)
Anesthesia Evaluation  Patient identified by MRN, date of birth, ID band Patient awake    Reviewed: Allergy & Precautions, H&P , NPO status , Patient's Chart, lab work & pertinent test results, reviewed documented beta blocker date and time   Airway Mallampati: II  TM Distance: >3 FB Neck ROM: full    Dental no notable dental hx.    Pulmonary neg pulmonary ROS, Current Smoker,    Pulmonary exam normal breath sounds clear to auscultation       Cardiovascular Exercise Tolerance: Good hypertension, negative cardio ROS   Rhythm:regular Rate:Normal     Neuro/Psych PSYCHIATRIC DISORDERS Anxiety Depression  Neuromuscular disease    GI/Hepatic Neg liver ROS, GERD  Medicated,  Endo/Other  negative endocrine ROS  Renal/GU negative Renal ROS  negative genitourinary   Musculoskeletal   Abdominal   Peds  Hematology  (+) Blood dyscrasia, anemia ,   Anesthesia Other Findings   Reproductive/Obstetrics negative OB ROS                             Anesthesia Physical Anesthesia Plan  ASA: 3  Anesthesia Plan: General   Post-op Pain Management:    Induction:   PONV Risk Score and Plan: Propofol infusion  Airway Management Planned:   Additional Equipment:   Intra-op Plan:   Post-operative Plan:   Informed Consent: I have reviewed the patients History and Physical, chart, labs and discussed the procedure including the risks, benefits and alternatives for the proposed anesthesia with the patient or authorized representative who has indicated his/her understanding and acceptance.     Dental Advisory Given  Plan Discussed with: CRNA  Anesthesia Plan Comments:         Anesthesia Quick Evaluation

## 2022-02-06 ENCOUNTER — Other Ambulatory Visit (HOSPITAL_COMMUNITY): Payer: 59

## 2022-02-06 ENCOUNTER — Encounter (HOSPITAL_COMMUNITY): Payer: Self-pay | Admitting: Hematology

## 2022-02-06 LAB — SURGICAL PATHOLOGY

## 2022-02-12 ENCOUNTER — Encounter (HOSPITAL_COMMUNITY): Payer: Self-pay | Admitting: Internal Medicine

## 2022-02-14 ENCOUNTER — Ambulatory Visit: Admit: 2022-02-14 | Payer: 59 | Admitting: Orthopedic Surgery

## 2022-02-14 SURGERY — ARTHROSCOPY, SHOULDER
Anesthesia: General | Site: Shoulder | Laterality: Left

## 2022-02-25 ENCOUNTER — Ambulatory Visit: Payer: 59 | Admitting: Orthopedic Surgery

## 2022-02-25 DIAGNOSIS — M25512 Pain in left shoulder: Secondary | ICD-10-CM | POA: Diagnosis not present

## 2022-02-25 DIAGNOSIS — G8929 Other chronic pain: Secondary | ICD-10-CM

## 2022-03-02 ENCOUNTER — Encounter: Payer: Self-pay | Admitting: Orthopedic Surgery

## 2022-03-02 NOTE — Progress Notes (Signed)
Office Visit Note   Patient: Suzanne Lawrence           Date of Birth: [birth year]           MRN: [id number] Visit Date: 02/25/2022 Requested by: [facility] [address] [place],  [place] [address] PCP: [facility]  Subjective: Chief Complaint  Patient presents with   Left Shoulder - Pain    HPI: Suzanne Lawrence is a 51 year old patient with left shoulder pain.  Has had pain for 3 years.  Does not recall specific injury.  She is right-hand dominant.  Pain wakes her from sleep at night.  Pain continues to worsen.  Reports radiating type pain from the biceps with spasm.  Describes grinding and popping and locking.  Also reports some neck pain as well as scapular pain.  She describes decreased range of motion due to stiffness and pain in that left shoulder.  She has to assist the left arm up with the right arm.  Patient had left shoulder surgery in May of last year.  Notes from that physician are reviewed.  She had arthroscopy debridement with distal clavicle excision and open subpectoral biceps tenodesis performed at that time.  Her postop course was complicated by subclavian vein thrombosis treated with Eliquis.  She was referred to a physician at [facility] for second opinion who recommended biceps exploration arthroscopy debridement and distal clavicle excision.  She did have good relief from intra-articular injection as part of that work-up and treatment at [facility].  MRI scan done this year demonstrates tendinosis of the supraspinatus and infraspinatus with thickening of the inferior axillary recess consistent with early adhesive capsulitis along with some AC joint arthrosis and fluid in that joint.              ROS: All systems reviewed are negative as they relate to the chief complaint within the history of present illness.  Patient denies  fevers or chills.   Assessment & Plan: Visit Diagnoses:  1. Chronic left shoulder pain     Plan: Impression is left shoulder  pain with multifactorial origins.  I think it is possible and likely that the biceps tenodesis is giving her significant pain and functional debilitation.  If that has failed I think it is unlikely that it can be restored at this point 1 year after surgery so exploration of that symptomatic biceps muscle is unlikely to provide predictable relief.  The activity related spasms she is having associated with her postoperative biceps tenodesis whether it is currently functional or nonfunctional is  something she is going to have to live with.  She also has a component of adhesive capsulitis affecting her shoulder.  I think that is something that could potentially be treated with arthroscopic rotator interval release along with manipulation and subsequent CPM use for the first 2 weeks after surgery.  I also agree that the [facility] joint status post resection could be  clinically symptomatic.  MRI scan review does reveal some edema but overall the resection appears adequate.  Structural features around the shoulder could make that difficult to do arthroscopically.  Patient understands about the risk and benefits of recurrent surgery on that left shoulder and the less than predictable outcomes from such an intervention.  Nonetheless she would like to have an intervention to try to improve her shoulder symptoms.  I think it is possible but unlikely that she has developed rotator cuff pathology that would require treatment since her MRI scan earlier this year.  Plan is shoulder arthroscopy with rotator interval release manipulation and possible revision AC joint resection.  Plan to use CPM machine for 6 hours a day for after surgery for the first 2 weeks.  All questions answered  Follow-Up Instructions: No follow-ups on file.   Orders:  No orders of the defined types were placed in this encounter.  No orders of the defined types were placed in this encounter.     Procedures: No procedures performed   Clinical  Data: No additional findings.  Objective: Vital Signs: There were no vitals taken for this visit.  Physical Exam:   Constitutional: Patient appears well-developed HEENT:  Head: Normocephalic Eyes:EOM are normal Neck: Normal range of motion Cardiovascular: Normal rate Pulmonary/chest: Effort normal Neurologic: Patient is alert Skin: Skin is warm Psychiatric: Patient has normal mood and affect   Ortho Exam: Ortho exam demonstrates on the left shoulder restricted passive range of motion of 35/75/140.  She has reasonable rotator cuff strength infraspinatus extremity subscap muscle testing.  Has definitive AC joint tenderness left versus right as well as pain with crossarm adduction.  Not too much coarse grinding or crepitus present.  She has some pectoral biceps tenodesis incision which is well-healed and nontender and her bicep contours are asymmetric.  Distally the biceps tendon is intact.  Specialty Comments:  No specialty comments available.  Imaging: No results found.   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 01/25/2020   High blood pressure    Depression    Carpal tunnel syndrome    Anxiety    Past Medical History:  Diagnosis Date   Abnormal Pap smear of cervix    Anxiety    Carpal tunnel syndrome    right   Depression    DVT (deep venous thrombosis) ([facility]) 03/2021   left arm   GERD (gastroesophageal reflux disease)    High blood pressure    Left breast mass     Family History  Problem Relation Age of Onset   Lung cancer Mother    Other Father        multiple issues, doesn't know history for sure   Prostate cancer Father    Stroke Father    Aneurysm Paternal Grandmother    Breast cancer Maternal Aunt     Prostate cancer Maternal Uncle    Alcoholism Maternal Uncle    Aneurysm Cousin        paternal side   Aneurysm Cousin        paternal side    Breast cancer Cousin        maternal side    High blood pressure Other        father's side    Cerebral aneurysm Other        cerebral, on father's side    Alcoholism Other        maternal side    Colon cancer Neg Hx     Past Surgical History:  Procedure Laterality Date   BIOPSY  02/05/2022   Procedure: BIOPSY;  Surgeon: Lanelle Bal, DO;  [facility];  Service: Endoscopy;;   COLONOSCOPY  2022   Megan Straughan: 7 polyps removed, has to return in one year   COLONOSCOPY  09/2021  Megan Straughan: 12 polyps removed   ESOPHAGOGASTRODUODENOSCOPY (EGD) WITH PROPOFOL N/A 02/05/2022   Procedure: ESOPHAGOGASTRODUODENOSCOPY (EGD) WITH PROPOFOL;  Surgeon: Eloise Harman, DO;  Location: [facility];  Service: Endoscopy;  Laterality: N/A;  2:00pm   left shoulder arthroscopy  12/2020   TUBAL LIGATION     Social History   Occupational History   Not on file  Tobacco Use   Smoking status: Every Day    Packs/day: 0.50    Types: Cigarettes   Smokeless tobacco: Never  Vaping Use   Vaping Use: Never used  Substance and Sexual Activity   Alcohol use: Not Currently   Drug use: Not Currently   Sexual activity: Yes    Birth control/protection: None

## 2022-03-08 ENCOUNTER — Inpatient Hospital Stay (HOSPITAL_COMMUNITY): Payer: 59 | Attending: Hematology

## 2022-03-08 DIAGNOSIS — F32A Depression, unspecified: Secondary | ICD-10-CM | POA: Diagnosis not present

## 2022-03-08 DIAGNOSIS — R5383 Other fatigue: Secondary | ICD-10-CM | POA: Insufficient documentation

## 2022-03-08 DIAGNOSIS — Z86718 Personal history of other venous thrombosis and embolism: Secondary | ICD-10-CM | POA: Insufficient documentation

## 2022-03-08 DIAGNOSIS — D509 Iron deficiency anemia, unspecified: Secondary | ICD-10-CM | POA: Diagnosis present

## 2022-03-08 DIAGNOSIS — R002 Palpitations: Secondary | ICD-10-CM | POA: Insufficient documentation

## 2022-03-08 DIAGNOSIS — K219 Gastro-esophageal reflux disease without esophagitis: Secondary | ICD-10-CM | POA: Insufficient documentation

## 2022-03-08 DIAGNOSIS — G479 Sleep disorder, unspecified: Secondary | ICD-10-CM | POA: Insufficient documentation

## 2022-03-08 DIAGNOSIS — R200 Anesthesia of skin: Secondary | ICD-10-CM | POA: Insufficient documentation

## 2022-03-08 DIAGNOSIS — F17210 Nicotine dependence, cigarettes, uncomplicated: Secondary | ICD-10-CM | POA: Diagnosis not present

## 2022-03-08 DIAGNOSIS — R059 Cough, unspecified: Secondary | ICD-10-CM | POA: Insufficient documentation

## 2022-03-08 DIAGNOSIS — K449 Diaphragmatic hernia without obstruction or gangrene: Secondary | ICD-10-CM | POA: Diagnosis not present

## 2022-03-08 DIAGNOSIS — K621 Rectal polyp: Secondary | ICD-10-CM | POA: Diagnosis not present

## 2022-03-08 DIAGNOSIS — R197 Diarrhea, unspecified: Secondary | ICD-10-CM | POA: Diagnosis not present

## 2022-03-08 DIAGNOSIS — K317 Polyp of stomach and duodenum: Secondary | ICD-10-CM | POA: Diagnosis not present

## 2022-03-08 DIAGNOSIS — R42 Dizziness and giddiness: Secondary | ICD-10-CM | POA: Insufficient documentation

## 2022-03-08 DIAGNOSIS — R110 Nausea: Secondary | ICD-10-CM | POA: Insufficient documentation

## 2022-03-08 DIAGNOSIS — R0602 Shortness of breath: Secondary | ICD-10-CM | POA: Insufficient documentation

## 2022-03-08 DIAGNOSIS — Z803 Family history of malignant neoplasm of breast: Secondary | ICD-10-CM | POA: Insufficient documentation

## 2022-03-08 DIAGNOSIS — D500 Iron deficiency anemia secondary to blood loss (chronic): Secondary | ICD-10-CM

## 2022-03-08 DIAGNOSIS — R0609 Other forms of dyspnea: Secondary | ICD-10-CM | POA: Diagnosis not present

## 2022-03-08 DIAGNOSIS — Z79899 Other long term (current) drug therapy: Secondary | ICD-10-CM | POA: Diagnosis not present

## 2022-03-08 DIAGNOSIS — Z800 Family history of malignant neoplasm of digestive organs: Secondary | ICD-10-CM | POA: Insufficient documentation

## 2022-03-08 DIAGNOSIS — Z801 Family history of malignant neoplasm of trachea, bronchus and lung: Secondary | ICD-10-CM | POA: Insufficient documentation

## 2022-03-08 DIAGNOSIS — R112 Nausea with vomiting, unspecified: Secondary | ICD-10-CM | POA: Insufficient documentation

## 2022-03-08 LAB — IRON AND TIBC
Iron: 111 ug/dL (ref 28–170)
Saturation Ratios: 31 % (ref 10.4–31.8)
TIBC: 354 ug/dL (ref 250–450)
UIBC: 243 ug/dL

## 2022-03-08 LAB — CBC WITH DIFFERENTIAL/PLATELET
Abs Immature Granulocytes: 0.02 10*3/uL (ref 0.00–0.07)
Basophils Absolute: 0.1 10*3/uL (ref 0.0–0.1)
Basophils Relative: 1 %
Eosinophils Absolute: 0.2 10*3/uL (ref 0.0–0.5)
Eosinophils Relative: 2 %
HCT: 47.4 % — ABNORMAL HIGH (ref 36.0–46.0)
Hemoglobin: 15.3 g/dL — ABNORMAL HIGH (ref 12.0–15.0)
Immature Granulocytes: 0 %
Lymphocytes Relative: 37 %
Lymphs Abs: 2.8 10*3/uL (ref 0.7–4.0)
MCH: 24.9 pg — ABNORMAL LOW (ref 26.0–34.0)
MCHC: 32.3 g/dL (ref 30.0–36.0)
MCV: 77.2 fL — ABNORMAL LOW (ref 80.0–100.0)
Monocytes Absolute: 0.6 10*3/uL (ref 0.1–1.0)
Monocytes Relative: 8 %
Neutro Abs: 3.9 10*3/uL (ref 1.7–7.7)
Neutrophils Relative %: 52 %
Platelets: 318 10*3/uL (ref 150–400)
RBC: 6.14 MIL/uL — ABNORMAL HIGH (ref 3.87–5.11)
RDW: 21.3 % — ABNORMAL HIGH (ref 11.5–15.5)
WBC: 7.6 10*3/uL (ref 4.0–10.5)
nRBC: 0 % (ref 0.0–0.2)

## 2022-03-08 LAB — FERRITIN: Ferritin: 61 ng/mL (ref 11–307)

## 2022-03-14 NOTE — Progress Notes (Signed)
[place], [place] [address]   CLINIC:  Medical Oncology/Hematology  PCP:  [facility] [place] [address] [phone]   REASON FOR VISIT:  Follow-up for iron deficiency anemia  PRIOR THERAPY: Iron tablet  CURRENT THERAPY: Intermittent IV iron (last on 12/18/2021 and 12/26/2021)  INTERVAL HISTORY:  Suzanne Lawrence 51 y.o. female returns for routine follow-up of iron deficiency anemia.  She was seen for initial consultation by Dr. Delton Coombes on 12/13/2021.  At today's visit, she reports feeling fair.  No recent hospitalizations, surgeries, or changes in baseline health status.  Patient had mild reaction to her first IV Feraheme infusion (12/18/2021) with symptoms of feeling hot, chest pressure, and nausea.  Iron infusion was paused, she was given Pepcid, IV Solu-Medrol, and IV Benadryl.  Infusion was restarted after symptoms subsided, and she was able to complete iron infusion without any other symptoms. She received premedications for her second IV Feraheme infusion (12/26/2021) and tolerated it well without any issues.  Patient reports that after her IV iron, she has had improved energy and resolution of her ice pica.  She has not had any major menstrual bleeding since her uterine ablation in March 2023, but reports that for the past 2 months she had a "mini menstrual cycle," with 5 days of light bleeding each month.  She still has some residual fatigue with energy about 50%, but this is improved from before.  She has some chronic dyspnea on exertion from "being out of shape," dizziness/lightheadedness from vertigo, and palpitations.  She denies any restless legs, headaches, chest pain, syncope.  She continues to take iron tablets daily at home.  She has 50% energy and 75% appetite. She endorses that she is maintaining a stable weight.   REVIEW OF SYSTEMS:  Review of Systems  Constitutional:  Positive for fatigue. Negative for  appetite change, chills, diaphoresis, fever and unexpected weight change.  HENT:   Negative for lump/mass and nosebleeds.   Eyes:  Negative for eye problems.  Respiratory:  Positive for cough and shortness of breath (with exertion). Negative for hemoptysis.   Cardiovascular:  Positive for palpitations. Negative for chest pain and leg swelling.  Gastrointestinal:  Positive for diarrhea, nausea and vomiting. Negative for abdominal pain, blood in stool and constipation.  Genitourinary:  Negative for hematuria.   Skin: Negative.   Neurological:  Positive for dizziness and numbness. Negative for headaches and light-headedness.  Hematological:  Does not bruise/bleed easily.  Psychiatric/Behavioral:  Positive for depression and sleep disturbance. The patient is nervous/anxious.       PAST MEDICAL/SURGICAL HISTORY:  Past Medical History:  Diagnosis Date   Abnormal Pap smear of cervix    Anxiety    Carpal tunnel syndrome    right   Depression    DVT (deep venous thrombosis) ([place]) 03/2021   left arm   GERD (gastroesophageal reflux disease)    High blood pressure    Left breast mass    Past Surgical History:  Procedure Laterality Date   BIOPSY  02/05/2022   Procedure: BIOPSY;  Surgeon: Eloise Harman, DO;  [facility];  Service: Endoscopy;;   COLONOSCOPY  2022   Megan Straughan: 7 polyps removed, has to return in one year   COLONOSCOPY  09/2021   Megan Straughan: 12 polyps removed   ESOPHAGOGASTRODUODENOSCOPY (EGD) WITH PROPOFOL N/A 02/05/2022   Procedure: ESOPHAGOGASTRODUODENOSCOPY (EGD) WITH PROPOFOL;  Surgeon: Eloise Harman, DO;  Location: [facility];  Service: Endoscopy;  Laterality: N/A;  2:00pm   left shoulder arthroscopy  12/2020   TUBAL LIGATION       SOCIAL HISTORY:  Social History   Socioeconomic History   Marital status: Single    Spouse name: Not on file   Number of children: 3   Years of education: Not on file   Highest education level: Some  college, no degree  Occupational History   Not on file  Tobacco Use   Smoking status: Every Day    Packs/day: 0.50    Types: Cigarettes   Smokeless tobacco: Never  Vaping Use   Vaping Use: Never used  Substance and Sexual Activity   Alcohol use: Not Currently   Drug use: Not Currently   Sexual activity: Yes    Birth control/protection: None  Other Topics Concern   Not on file  Social History Narrative   Lives at home with 2 daughters   Right handed   Caffeine: about 2-3 per day    Social Determinants of Health   Financial Resource Strain: Not on file  Food Insecurity: Not on file  Transportation Needs: Not on file  Physical Activity: Not on file  Stress: Not on file  Social Connections: Not on file  Intimate Partner Violence: Not on file    FAMILY HISTORY:  Family History  Problem Relation Age of Onset   Lung cancer Mother    Other Father        multiple issues, doesn't know history for sure   Prostate cancer Father    Stroke Father    Aneurysm Paternal Grandmother    Breast cancer Maternal Aunt    Prostate cancer Maternal Uncle    Alcoholism Maternal Uncle    Aneurysm Cousin        paternal side   Aneurysm Cousin        paternal side    Breast cancer Cousin        maternal side    High blood pressure Other        father's side    Cerebral aneurysm Other        cerebral, on father's side    Alcoholism Other        maternal side    Colon cancer Neg Hx     CURRENT MEDICATIONS:  Outpatient Encounter Medications as of 03/15/2022  Medication Sig   acetaminophen (TYLENOL) 325 MG tablet Take 650 mg by mouth every 6 (six) hours as needed for moderate pain.   amLODipine (NORVASC) 10 MG tablet Take 10 mg by mouth daily.   ARIPiprazole (ABILIFY) 10 MG tablet Take 10 mg by mouth daily.   Ascorbic Acid (VITAMIN C PO) Take 1 tablet by mouth daily.   atorvastatin (LIPITOR) 10 MG tablet Take 10 mg by mouth at bedtime.   baclofen (LIORESAL) 10 MG tablet Take 10 mg by  mouth 3 (three) times daily as needed (Back pain).   chlorthalidone (HYGROTON) 25 MG tablet Take 25 mg by mouth every morning.   cholecalciferol (VITAMIN D) 25 MCG (1000 UNIT) tablet Take 1,000 Units by mouth daily.   DULoxetine (CYMBALTA) 60 MG capsule Take 60 mg by mouth daily in the afternoon.   famotidine (PEPCID) 40 MG tablet Take 40 mg by mouth daily.   Ferrous Gluconate-C-Folic Acid (IRON-C PO) Take 60 mg by mouth daily.   gabapentin (NEURONTIN) 300 MG capsule Take 300 mg by mouth at bedtime.   lisinopril (ZESTRIL) 40 MG tablet Take  40 mg by mouth every morning.   meclizine (ANTIVERT) 25 MG tablet Take 25 mg by mouth 3 (three) times daily as needed for dizziness.   metFORMIN (GLUCOPHAGE-XR) 500 MG 24 hr tablet Take 500 mg by mouth 2 (two) times daily before a meal.   omeprazole (PRILOSEC) 40 MG capsule Take 40 mg by mouth daily.   sucralfate (CARAFATE) 1 g tablet Take 1 g by mouth daily as needed (Acid reflux).   traZODone (DESYREL) 50 MG tablet Take 50 mg by mouth at bedtime as needed for sleep.   No facility-administered encounter medications on file as of 03/15/2022.    ALLERGIES:  Allergies  Allergen Reactions   Ferumoxytol Other (See Comments)    Hot flash, nausea, chest pressure    Hydrochlorothiazide Other (See Comments)     "urinary frequency"; was going every 20 minutes and was causing problems at work     PHYSICAL EXAM:  ECOG PERFORMANCE STATUS: 1 - Symptomatic but completely ambulatory  There were no vitals filed for this visit. There were no vitals filed for this visit. Physical Exam Constitutional:      Appearance: Normal appearance. She is obese.  HENT:     Head: Normocephalic and atraumatic.     Mouth/Throat:     Mouth: Mucous membranes are moist.  Eyes:     Extraocular Movements: Extraocular movements intact.     Pupils: Pupils are equal, round, and reactive to light.  Cardiovascular:     Rate and Rhythm: Normal rate and regular rhythm.     Pulses:  Normal pulses.     Heart sounds: Normal heart sounds.  Pulmonary:     Effort: Pulmonary effort is normal.     Breath sounds: Normal breath sounds.  Abdominal:     General: Bowel sounds are normal.     Palpations: Abdomen is soft.     Tenderness: There is no abdominal tenderness.  Musculoskeletal:        General: No swelling.     Right lower leg: No edema.     Left lower leg: No edema.  Lymphadenopathy:     Cervical: No cervical adenopathy.  Skin:    General: Skin is warm and dry.  Neurological:     General: No focal deficit present.     Mental Status: She is alert and oriented to person, place, and time.  Psychiatric:        Mood and Affect: Mood normal.        Behavior: Behavior normal.      LABORATORY DATA:  I have reviewed the labs as listed.  CBC    Component Value Date/Time   WBC 7.6 03/08/2022 1029   RBC 6.14 (H) 03/08/2022 1029   HGB 15.3 (H) 03/08/2022 1029   HCT 47.4 (H) 03/08/2022 1029   PLT 318 03/08/2022 1029   MCV 77.2 (L) 03/08/2022 1029   MCH 24.9 (L) 03/08/2022 1029   MCHC 32.3 03/08/2022 1029   RDW 21.3 (H) 03/08/2022 1029   LYMPHSABS 2.8 03/08/2022 1029   MONOABS 0.6 03/08/2022 1029   EOSABS 0.2 03/08/2022 1029   BASOSABS 0.1 03/08/2022 1029      Latest Ref Rng & Units 02/01/2022   12:49 PM  CMP  Glucose 70 - 99 mg/dL 171   BUN 6 - 20 mg/dL 11   Creatinine 0.44 - 1.00 mg/dL 0.73   Sodium 135 - 145 mmol/L 135   Potassium 3.5 - 5.1 mmol/L 3.5   Chloride 98 - 111 mmol/L  102   CO2 22 - 32 mmol/L 26   Calcium 8.9 - 10.3 mg/dL 9.1     DIAGNOSTIC IMAGING:  I have independently reviewed the relevant imaging and discussed with the patient.  ASSESSMENT & PLAN: 1.  Iron deficiency anemia: - She has been on iron tablet daily since January of this year and is tolerating well, but with only very minimal improvement in Hgb - Labs on 11/13/2021 with hemoglobin 9.5, MCV 65.  Ferritin was 5.6. - She has menorrhagia, status post endometrial ablation in  March 2023.  Since uterine ablation, she has had "many menstrual cycles," with light bleeding about 5 days each month. - She denies bright red blood per rectum or melena.   - Colonoscopy (10/03/2021): Removal of 2 sessile polyps in the descending colon, 2 sessile polyps in the sigmoid colon, 4 sessile polyps in the sigmoid colon, 3 sessile polyps in the rectosigmoid colon.  Pathology report shows all removed polyps to be hyperplastic with no dysplasia or malignancy. - EGD (02/05/2022): Small hiatal hernia, gastritis, gastric polyps x3 - She received IV Feraheme x2 (12/18/2021 and 12/26/2021).  She had mild INFUSION REACTION to first dose of Feraheme, was able to complete after receiving IV steroids.  Tolerated her second IV Feraheme infusion well with premedications.   - She has improved energy and resolution of ice pica after IV iron, but does still have some residual fatigue. - Most recent labs (03/08/2022): Hgb 15.3 with elevated RBC 6.14 and low MCV 77.2.  Ferritin 61, iron saturation 31%. - Suspect that iron deficiency anemia was residual from history of menorrhagia, but expect that this will improve now that she has had endometrial ablation.   - PLAN: No indication for IV iron at this time.  Continue oral iron supplementation.   - Due to persistent microcytosis and elevated RBC, we will check hemoglobin electrophoresis due to suspicion for thalassemia or sickle cell trait.  We will also check copper levels. - Elevated hemoglobin may be secondary erythrocytosis from smoking.  We discussed smoking cessation.  Would consider MPN work-up if continuing to elevate in the future. - We will repeat CBC and iron panel with same-day office visit in 4 months and will discuss results of hemoglobin electrophoresis at that time  - If labs remain stable over the next 1 to 2 years, will consider discharging from clinic, since presumed source of blood/iron loss has resolved (menorrhagia s/p endometrial ablation)    2.   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  She had exposure to chemicals including heptane and toluene. - She is current active smoker, 1 pack every 2 to 3 days for 25 years.  (Does not qualify for LCS/LDCT chest due to high pack-year history <20) - No family history of severe anemia or sickle cell disease.  Mother had lung cancer.  Father had prostate and colon cancer.  Maternal aunt had breast cancer.  Maternal and paternal cousins had breast cancer.  Maternal uncle had colon cancer.   PLAN SUMMARY & DISPOSITION: Labs today Same-day labs and office visit in 4 months  All questions were answered. The patient knows to call the clinic with any problems, questions or concerns.  Medical decision making: Moderate  Time spent on visit: I spent 20 minutes counseling the patient face to face. The total time spent in the appointment was 30 minutes and more than 50% was on counseling.   Rushie Goltz  Buford Dresser, PA-C  03/15/2022 10:15 AM

## 2022-03-15 ENCOUNTER — Inpatient Hospital Stay (HOSPITAL_COMMUNITY): Payer: 59

## 2022-03-15 ENCOUNTER — Inpatient Hospital Stay (HOSPITAL_BASED_OUTPATIENT_CLINIC_OR_DEPARTMENT_OTHER): Payer: 59 | Admitting: Physician Assistant

## 2022-03-15 VITALS — BP 147/91 | HR 89 | Temp 97.6°F | Resp 18 | Ht 61.0 in | Wt 179.5 lb

## 2022-03-15 DIAGNOSIS — D500 Iron deficiency anemia secondary to blood loss (chronic): Secondary | ICD-10-CM | POA: Diagnosis not present

## 2022-03-15 DIAGNOSIS — R718 Other abnormality of red blood cells: Secondary | ICD-10-CM

## 2022-03-15 DIAGNOSIS — D509 Iron deficiency anemia, unspecified: Secondary | ICD-10-CM | POA: Diagnosis not present

## 2022-03-15 NOTE — Pre-Procedure Instructions (Signed)
Surgical Instructions    Your procedure is scheduled on March 21, 2022.  Report to [facility] Main Entrance "A" at 5:30 A.M., then check in with the Admitting office.  Call this number if you have problems the morning of surgery:  [phone]   If you have any questions prior to your surgery date call [phone]: Open Monday-Friday 8am-4pm    Remember:  Do not eat after midnight the night before your surgery  You may drink clear liquids until 4:30 AM the morning of your surgery.   Clear liquids allowed are: Water, Non-Citrus Juices (without pulp), Carbonated Beverages, Clear Tea, Black Coffee Only (NO MILK, CREAM OR POWDERED CREAMER of any kind), and Gatorade.  Patient Instructions  The night before surgery:  No food after midnight. ONLY clear liquids after midnight  The day of surgery (if you do NOT have diabetes):  Drink ONE (1) Pre-Surgery Clear Ensure by 4:30 AM the morning of surgery. Drink in one sitting. Do not sip.  This drink was given to you during your hospital  pre-op appointment visit.  Nothing else to drink after completing the  Pre-Surgery Clear Ensure.         If you have questions, please contact your surgeon's office.     Take these medicines the morning of surgery with A SIP OF WATER:  ARIPiprazole (ABILIFY)  famotidine (PEPCID)   omeprazole (PRILOSEC)  DULoxetine (CYMBALTA)   Take these medicines the morning of surgery AS NEEDED:  acetaminophen (TYLENOL)   baclofen (LIORESAL)   meclizine (ANTIVERT)   sucralfate (CARAFATE)  As of today, STOP taking any Aspirin (unless otherwise instructed by your surgeon) Aleve, Naproxen, Ibuprofen, Motrin, Advil, Goody's, BC's, all herbal medications, fish oil, and all vitamins.  WHAT DO I DO ABOUT MY DIABETES MEDICATION?   Do not take metFORMIN (GLUCOPHAGE-XR) the morning of surgery.   HOW TO MANAGE YOUR DIABETES BEFORE AND AFTER SURGERY  Why is it important to control my blood sugar before and after  surgery? Improving blood sugar levels before and after surgery helps healing and can limit problems. A way of improving blood sugar control is eating a healthy diet by:  Eating less sugar and carbohydrates  Increasing activity/exercise  Talking with your doctor about reaching your blood sugar goals High blood sugars (greater than 180 mg/dL) can raise your risk of infections and slow your recovery, so you will need to focus on controlling your diabetes during the weeks before surgery. Make sure that the doctor who takes care of your diabetes knows about your planned surgery including the date and location.  How do I manage my blood sugar before surgery? Check your blood sugar at least 4 times a day, starting 2 days before surgery, to make sure that the level is not too high or low.  Check your blood sugar the morning of your surgery when you wake up and every 2 hours until you get to the Short Stay unit.  If your blood sugar is less than 70 mg/dL, you will need to treat for low blood sugar: Do not take insulin. Treat a low blood sugar (less than 70 mg/dL) with  cup of clear juice (cranberry or apple), 4 glucose tablets, OR glucose gel. Recheck blood sugar in 15 minutes after treatment (to make sure it is greater than 70 mg/dL). If your blood sugar is not greater than 70 mg/dL on recheck, call [phone] for further instructions. Report your blood sugar to the short stay nurse when you get to  Short Stay.  If you are admitted to the hospital after surgery: Your blood sugar will be checked by the staff and you will probably be given insulin after surgery (instead of oral diabetes medicines) to make sure you have good blood sugar levels. The goal for blood sugar control after surgery is 80-180 mg/dL.             Do NOT Smoke (Tobacco/Vaping) for 24 hours prior to your procedure.  If you use a CPAP at night, you may bring your mask/headgear for your overnight stay.   Contacts, glasses,  piercing's, hearing aid's, dentures or partials may not be worn into surgery, please bring cases for these belongings.    For patients admitted to the hospital, discharge time will be determined by your treatment team.   Patients discharged the day of surgery will not be allowed to drive home, and someone needs to stay with them for 24 hours.  SURGICAL WAITING ROOM VISITATION Patients having surgery or a procedure may have no more than 2 support people in the waiting area - these visitors may rotate.   Children under the age of 21 must have an adult with them who is not the patient. If the patient needs to stay at the hospital during part of their recovery, the visitor guidelines for inpatient rooms apply. Pre-op nurse will coordinate an appropriate time for 1 support person to accompany patient in pre-op.  This support person may not rotate.   Please refer to the [facility] website for the visitor guidelines for Inpatients (after your surgery is over and you are in a regular room).    Special instructions:   [facility]- Preparing For Surgery  Before surgery, you can play an important role. Because skin is not sterile, your skin needs to be as free of germs as possible. You can reduce the number of germs on your skin by washing with CHG (chlorahexidine gluconate) Soap before surgery.  CHG is an antiseptic cleaner which kills germs and bonds with the skin to continue killing germs even after washing.    Oral Hygiene is also important to reduce your risk of infection.  Remember - BRUSH YOUR TEETH THE MORNING OF SURGERY WITH YOUR REGULAR TOOTHPASTE  Please do not use if you have an allergy to CHG or antibacterial soaps. If your skin becomes reddened/irritated stop using the CHG.  Do not shave (including legs and underarms) for at least 48 hours prior to first CHG shower. It is OK to shave your face.  Please follow these instructions carefully.   Shower the NIGHT BEFORE SURGERY and the  MORNING OF SURGERY  If you chose to wash your hair, wash your hair first as usual with your normal shampoo.  After you shampoo, rinse your hair and body thoroughly to remove the shampoo.  Use CHG Soap as you would any other liquid soap. You can apply CHG directly to the skin and wash gently with a scrungie or a clean washcloth.   Apply the CHG Soap to your body ONLY FROM THE NECK DOWN.  Do not use on open wounds or open sores. Avoid contact with your eyes, ears, mouth and genitals (private parts). Wash Face and genitals (private parts)  with your normal soap.   Wash thoroughly, paying special attention to the area where your surgery will be performed.  Thoroughly rinse your body with warm water from the neck down.  DO NOT shower/wash with your normal soap after using and rinsing off the  CHG Soap.  Pat yourself dry with a CLEAN TOWEL.  Wear CLEAN PAJAMAS to bed the night before surgery  Place CLEAN SHEETS on your bed the night before your surgery  DO NOT SLEEP WITH PETS.   Day of Surgery: Take a shower with CHG soap. Do not wear jewelry or makeup Do not wear lotions, powders, perfumes/colognes, or deodorant. Do not shave 48 hours prior to surgery.  Men may shave face and neck. Do not bring valuables to the hospital.  [facility] is not responsible for any belongings or valuables. Do not wear nail polish, gel polish, artificial nails, or any other type of covering on natural nails (fingers and toes) If you have artificial nails or gel coating that need to be removed by a nail salon, please have this removed prior to surgery. Artificial nails or gel coating may interfere with anesthesia's ability to adequately monitor your vital signs.  Wear Clean/Comfortable clothing the morning of surgery  Remember to brush your teeth WITH YOUR REGULAR TOOTHPASTE.   Please read over the following fact sheets that you were given.    If you received a COVID test during your pre-op visit  it is  requested that you wear a mask when out in public, stay away from anyone that may not be feeling well and notify your surgeon if you develop symptoms. If you have been in contact with anyone that has tested positive in the last 10 days please notify you surgeon.

## 2022-03-15 NOTE — Patient Instructions (Signed)
[facility] at [facility] Discharge Instructions  You were seen today by Rojelio Brenner PA-C for your iron deficiency anemia.  Your blood and iron levels look much better!  You do not need any IV iron at this time.  You should continue to take your iron pill once daily.    Even though your iron levels are better, your red blood cells are still slightly smaller than normal.  This may be due to an underlying genetic abnormality, so we will check an additional blood test today to see if you have this trait.  We will discuss these results in more detail at your next visit.  We will recheck your blood counts and iron labs in 4 months with same-day office visit.   - - - - - - - - - - - - - - - - - -    Thank you for choosing [facility] at [facility] to provide your oncology and hematology care.  To afford each patient quality time with our provider, please arrive at least 15 minutes before your scheduled appointment time.   If you have a lab appointment with the Cancer Center please come in thru the Main Entrance and check in at the main information desk.  You need to re-schedule your appointment should you arrive 10 or more minutes late.  We strive to give you quality time with our providers, and arriving late affects you and other patients whose appointments are after yours.  Also, if you no show three or more times for appointments you may be dismissed from the clinic at the providers discretion.     Again, thank you for choosing [facility].  Our hope is that these requests will decrease the amount of time that you wait before being seen by our physicians.       _____________________________________________________________  Should you have questions after your visit to [facility], please contact our office at [phone] and follow the prompts.  Our office hours are 8:00 a.m. and 4:30 p.m. Monday - Friday.  Please  note that voicemails left after 4:00 p.m. may not be returned until the following business day.  We are closed weekends and major holidays.  You do have access to a nurse 24-7, just call the main number to the clinic [phone] and do not press any options, hold on the line and a nurse will answer the phone.    For prescription refill requests, have your pharmacy contact our office and allow 72 hours.    Due to Covid, you will need to wear a mask upon entering the hospital. If you do not have a mask, a mask will be given to you at the Main Entrance upon arrival. For doctor visits, patients may have 1 support person age 61 or older with them. For treatment visits, patients can not have anyone with them due to social distancing guidelines and our immunocompromised population.

## 2022-03-18 ENCOUNTER — Other Ambulatory Visit: Payer: Self-pay

## 2022-03-18 ENCOUNTER — Encounter (HOSPITAL_COMMUNITY): Payer: Self-pay

## 2022-03-18 ENCOUNTER — Encounter (HOSPITAL_COMMUNITY)
Admission: RE | Admit: 2022-03-18 | Discharge: 2022-03-18 | Disposition: A | Discharge status: Home / Self Care | Payer: 59 | Source: Ambulatory Visit | Attending: Orthopedic Surgery | Admitting: Orthopedic Surgery

## 2022-03-18 DIAGNOSIS — Z01812 Encounter for preprocedural laboratory examination: Secondary | ICD-10-CM | POA: Insufficient documentation

## 2022-03-18 HISTORY — DX: Prediabetes: R73.03

## 2022-03-18 HISTORY — DX: Personal history of other diseases of the digestive system: Z87.19

## 2022-03-18 HISTORY — DX: Unspecified osteoarthritis, unspecified site: M19.90

## 2022-03-18 HISTORY — DX: Anemia, unspecified: D64.9

## 2022-03-18 LAB — HGB FRACTIONATION CASCADE
Hgb A2: 2.5 % (ref 1.8–3.2)
Hgb A: 97.5 % (ref 96.4–98.8)
Hgb F: 0 % (ref 0.0–2.0)
Hgb S: 0 %

## 2022-03-18 LAB — GLUCOSE, CAPILLARY: Glucose-Capillary: 219 mg/dL — ABNORMAL HIGH (ref 70–99)

## 2022-03-18 NOTE — Progress Notes (Signed)
PCP - Dr. Mayford Knife with [facility] Cardiologist - Denies  PPM/ICD - Denies Device Orders - n/a Rep Notified - n/a  Chest x-ray - n/a EKG - 05/15/2021 - Tracing requested from [facility] ED Stress Test - denies ECHO - denies Cardiac Cath - denies  Sleep Study - denies CPAP - n/a  No hx of DM, but per patient hx of pre-diabetes. Pt takes Metformin at home. CBG at PAT visit was 219. She had a cup of coffee with 2 creams and 4 sugars this morning prior to visit. She does not have a CBG meter at home and does not check sugar at home. A1c requested from PCP office (done earlier this month)  Blood Thinner Instructions: n/a Aspirin Instructions: As of today, STOP taking any aspirin  ERAS Protcol - Clear liquids until 0430 morning of surgery PRE-SURGERY Ensure or G2- G2 given at PAT visit for Pre-DM and high CBG.  COVID TEST- n/a   Anesthesia review: Yes. Cardiac Hx with EKG tracing pending from [facility] ED. Pending A1c result from PCP visit this month as well with high CBG at PAT appointment.    Patient denies shortness of breath, fever, cough and chest pain at PAT appointment   All instructions explained to the patient, with a verbal understanding of the material. Patient agrees to go over the instructions while at home for a better understanding. Patient also instructed to self quarantine after being tested for COVID-19. The opportunity to ask questions was provided.

## 2022-03-20 LAB — COPPER, SERUM: Copper: 118 ug/dL (ref 80–158)

## 2022-03-21 ENCOUNTER — Other Ambulatory Visit: Payer: Self-pay

## 2022-03-21 ENCOUNTER — Ambulatory Visit (HOSPITAL_BASED_OUTPATIENT_CLINIC_OR_DEPARTMENT_OTHER): Payer: 59 | Admitting: Anesthesiology

## 2022-03-21 ENCOUNTER — Ambulatory Visit (HOSPITAL_COMMUNITY): Payer: 59 | Admitting: Physician Assistant

## 2022-03-21 ENCOUNTER — Encounter (HOSPITAL_COMMUNITY): Payer: Self-pay | Admitting: Orthopedic Surgery

## 2022-03-21 ENCOUNTER — Encounter (HOSPITAL_COMMUNITY)
Admission: RE | Disposition: A | Discharge status: Home / Self Care | Payer: Self-pay | Source: Home / Self Care | Attending: Orthopedic Surgery

## 2022-03-21 ENCOUNTER — Ambulatory Visit (HOSPITAL_COMMUNITY)
Admission: RE | Admit: 2022-03-21 | Discharge: 2022-03-21 | Disposition: A | Discharge status: Home / Self Care | Payer: 59 | Attending: Orthopedic Surgery | Admitting: Orthopedic Surgery

## 2022-03-21 DIAGNOSIS — K449 Diaphragmatic hernia without obstruction or gangrene: Secondary | ICD-10-CM | POA: Diagnosis not present

## 2022-03-21 DIAGNOSIS — F419 Anxiety disorder, unspecified: Secondary | ICD-10-CM | POA: Insufficient documentation

## 2022-03-21 DIAGNOSIS — E669 Obesity, unspecified: Secondary | ICD-10-CM | POA: Diagnosis not present

## 2022-03-21 DIAGNOSIS — Z01818 Encounter for other preprocedural examination: Secondary | ICD-10-CM

## 2022-03-21 DIAGNOSIS — I10 Essential (primary) hypertension: Secondary | ICD-10-CM | POA: Diagnosis not present

## 2022-03-21 DIAGNOSIS — K219 Gastro-esophageal reflux disease without esophagitis: Secondary | ICD-10-CM | POA: Insufficient documentation

## 2022-03-21 DIAGNOSIS — F17210 Nicotine dependence, cigarettes, uncomplicated: Secondary | ICD-10-CM | POA: Diagnosis not present

## 2022-03-21 DIAGNOSIS — M19012 Primary osteoarthritis, left shoulder: Secondary | ICD-10-CM

## 2022-03-21 DIAGNOSIS — G709 Myoneural disorder, unspecified: Secondary | ICD-10-CM | POA: Insufficient documentation

## 2022-03-21 DIAGNOSIS — M75102 Unspecified rotator cuff tear or rupture of left shoulder, not specified as traumatic: Secondary | ICD-10-CM | POA: Diagnosis not present

## 2022-03-21 DIAGNOSIS — M75122 Complete rotator cuff tear or rupture of left shoulder, not specified as traumatic: Secondary | ICD-10-CM | POA: Diagnosis not present

## 2022-03-21 DIAGNOSIS — M7502 Adhesive capsulitis of left shoulder: Secondary | ICD-10-CM | POA: Diagnosis not present

## 2022-03-21 DIAGNOSIS — F32A Depression, unspecified: Secondary | ICD-10-CM | POA: Insufficient documentation

## 2022-03-21 DIAGNOSIS — M1990 Unspecified osteoarthritis, unspecified site: Secondary | ICD-10-CM | POA: Diagnosis not present

## 2022-03-21 DIAGNOSIS — Z6841 Body Mass Index (BMI) 40.0 and over, adult: Secondary | ICD-10-CM | POA: Insufficient documentation

## 2022-03-21 HISTORY — PX: SHOULDER ARTHROSCOPY WITH OPEN ROTATOR CUFF REPAIR AND DISTAL CLAVICLE ACROMINECTOMY: SHX5683

## 2022-03-21 LAB — BASIC METABOLIC PANEL
Anion gap: 9 (ref 5–15)
BUN: 8 mg/dL (ref 6–20)
CO2: 24 mmol/L (ref 22–32)
Calcium: 9.2 mg/dL (ref 8.9–10.3)
Chloride: 105 mmol/L (ref 98–111)
Creatinine, Ser: 0.7 mg/dL (ref 0.44–1.00)
GFR, Estimated: >60 mL/min (ref >60)
Glucose, Bld: 151 mg/dL — ABNORMAL HIGH (ref 70–99)
Potassium: 3.5 mmol/L (ref 3.5–5.1)
Sodium: 138 mmol/L (ref 135–145)

## 2022-03-21 LAB — POCT PREGNANCY, URINE: Preg Test, Ur: NEGATIVE

## 2022-03-21 SURGERY — SHOULDER ARTHROSCOPY WITH OPEN ROTATOR CUFF REPAIR AND DISTAL CLAVICLE ACROMINECTOMY
Anesthesia: Regional | Laterality: Left

## 2022-03-21 MED ORDER — OXYCODONE HCL 5 MG PO TABS: 5.0000 mg | ORAL_TABLET | ORAL | 0 refills | Status: DC | PRN

## 2022-03-21 MED ORDER — PROPOFOL 10 MG/ML IV BOLUS
INTRAVENOUS | Status: DC | PRN
  Administered 2022-03-21: 50 mg via INTRAVENOUS
  Administered 2022-03-21: 150 mg via INTRAVENOUS

## 2022-03-21 MED ORDER — MIDAZOLAM HCL 2 MG/2ML IJ SOLN
INTRAMUSCULAR | Status: AC
  Filled 2022-03-21: qty 2

## 2022-03-21 MED ORDER — ROCURONIUM BROMIDE 10 MG/ML (PF) SYRINGE
PREFILLED_SYRINGE | INTRAVENOUS | Status: AC
  Filled 2022-03-21: qty 10

## 2022-03-21 MED ORDER — DEXAMETHASONE SODIUM PHOSPHATE 10 MG/ML IJ SOLN
INTRAMUSCULAR | Status: AC
  Filled 2022-03-21: qty 1

## 2022-03-21 MED ORDER — KETOROLAC TROMETHAMINE 30 MG/ML IJ SOLN: 30.0000 mg | Freq: Once | INTRAMUSCULAR | Status: DC

## 2022-03-21 MED ORDER — EPINEPHRINE PF 1 MG/ML IJ SOLN
INTRAMUSCULAR | Status: AC
Start: 2022-03-21 — End: ?
  Filled 2022-03-21: qty 1

## 2022-03-21 MED ORDER — PHENYLEPHRINE 80 MCG/ML (10ML) SYRINGE FOR IV PUSH (FOR BLOOD PRESSURE SUPPORT)
PREFILLED_SYRINGE | INTRAVENOUS | Status: DC | PRN
  Administered 2022-03-21 (×5): 80 ug via INTRAVENOUS

## 2022-03-21 MED ORDER — ASPIRIN 81 MG PO TBEC: 81.0000 mg | DELAYED_RELEASE_TABLET | Freq: Two times a day (BID) | ORAL | 0 refills | Status: AC

## 2022-03-21 MED ORDER — TRANEXAMIC ACID-NACL 1000-0.7 MG/100ML-% IV SOLN
1000.0000 mg | INTRAVENOUS | Status: DC
  Filled 2022-03-21: qty 100

## 2022-03-21 MED ORDER — FENTANYL CITRATE (PF) 250 MCG/5ML IJ SOLN
INTRAMUSCULAR | Status: DC | PRN
  Administered 2022-03-21: 50 ug via INTRAVENOUS
  Administered 2022-03-21: 100 ug via INTRAVENOUS

## 2022-03-21 MED ORDER — SUGAMMADEX SODIUM 200 MG/2ML IV SOLN
INTRAVENOUS | Status: DC | PRN
  Administered 2022-03-21: 200 mg via INTRAVENOUS

## 2022-03-21 MED ORDER — ROCURONIUM BROMIDE 10 MG/ML (PF) SYRINGE
PREFILLED_SYRINGE | INTRAVENOUS | Status: DC | PRN
  Administered 2022-03-21: 50 mg via INTRAVENOUS

## 2022-03-21 MED ORDER — EPINEPHRINE PF 1 MG/ML IJ SOLN
INTRAMUSCULAR | Status: DC | PRN
  Administered 2022-03-21: .15 mL
  Administered 2022-03-21: 2 mg

## 2022-03-21 MED ORDER — LIDOCAINE 2% (20 MG/ML) 5 ML SYRINGE
INTRAMUSCULAR | Status: DC | PRN
  Administered 2022-03-21: 40 mg via INTRAVENOUS

## 2022-03-21 MED ORDER — AMISULPRIDE (ANTIEMETIC) 5 MG/2ML IV SOLN
10.0000 mg | Freq: Once | INTRAVENOUS | Status: AC | PRN
  Administered 2022-03-21: 10 mg via INTRAVENOUS

## 2022-03-21 MED ORDER — DEXAMETHASONE SODIUM PHOSPHATE 10 MG/ML IJ SOLN
INTRAMUSCULAR | Status: DC | PRN
  Administered 2022-03-21: 10 mg via INTRAVENOUS

## 2022-03-21 MED ORDER — PHENYLEPHRINE HCL-NACL 20-0.9 MG/250ML-% IV SOLN
INTRAVENOUS | Status: DC | PRN
  Administered 2022-03-21: 50 ug/min via INTRAVENOUS

## 2022-03-21 MED ORDER — OXYCODONE HCL 5 MG PO TABS: 5.0000 mg | ORAL_TABLET | Freq: Once | ORAL | Status: DC | PRN

## 2022-03-21 MED ORDER — BUPIVACAINE HCL (PF) 0.5 % IJ SOLN
INTRAMUSCULAR | Status: DC | PRN
  Administered 2022-03-21: 15 mL via PERINEURAL

## 2022-03-21 MED ORDER — ONDANSETRON HCL 4 MG/2ML IJ SOLN
INTRAMUSCULAR | Status: DC | PRN
  Administered 2022-03-21: 4 mg via INTRAVENOUS

## 2022-03-21 MED ORDER — PROPOFOL 10 MG/ML IV BOLUS
INTRAVENOUS | Status: AC
Start: 2022-03-21 — End: ?
  Filled 2022-03-21: qty 20

## 2022-03-21 MED ORDER — ONDANSETRON HCL 4 MG/2ML IJ SOLN
INTRAMUSCULAR | Status: AC
Start: 2022-03-21 — End: ?
  Filled 2022-03-21: qty 2

## 2022-03-21 MED ORDER — SODIUM CHLORIDE 0.9 % IR SOLN
Status: DC | PRN
  Administered 2022-03-21: 1000 mL

## 2022-03-21 MED ORDER — CEFAZOLIN SODIUM-DEXTROSE 2-4 GM/100ML-% IV SOLN
2.0000 g | INTRAVENOUS | Status: AC
  Administered 2022-03-21: 2 g via INTRAVENOUS
  Filled 2022-03-21: qty 100

## 2022-03-21 MED ORDER — FENTANYL CITRATE (PF) 100 MCG/2ML IJ SOLN: 25.0000 ug | INTRAMUSCULAR | Status: DC | PRN

## 2022-03-21 MED ORDER — AMISULPRIDE (ANTIEMETIC) 5 MG/2ML IV SOLN
INTRAVENOUS | Status: AC
  Filled 2022-03-21: qty 4

## 2022-03-21 MED ORDER — FENTANYL CITRATE (PF) 250 MCG/5ML IJ SOLN
INTRAMUSCULAR | Status: AC
  Filled 2022-03-21: qty 5

## 2022-03-21 MED ORDER — BUPIVACAINE LIPOSOME 1.3 % IJ SUSP
INTRAMUSCULAR | Status: DC | PRN
  Administered 2022-03-21: 10 mL via PERINEURAL

## 2022-03-21 MED ORDER — AMISULPRIDE (ANTIEMETIC) 5 MG/2ML IV SOLN: 10.0000 mg | Freq: Once | INTRAVENOUS | Status: DC

## 2022-03-21 MED ORDER — ACETAMINOPHEN 10 MG/ML IV SOLN: 1000.0000 mg | Freq: Once | INTRAVENOUS | Status: DC | PRN

## 2022-03-21 MED ORDER — OXYCODONE HCL 5 MG/5ML PO SOLN: 5.0000 mg | Freq: Once | ORAL | Status: DC | PRN

## 2022-03-21 MED ORDER — PROPOFOL 10 MG/ML IV BOLUS
INTRAVENOUS | Status: AC
  Filled 2022-03-21: qty 20

## 2022-03-21 MED ORDER — POVIDONE-IODINE 7.5 % EX SOLN
Freq: Once | CUTANEOUS | Status: DC
  Filled 2022-03-21: qty 118

## 2022-03-21 MED ORDER — LIDOCAINE 2% (20 MG/ML) 5 ML SYRINGE
INTRAMUSCULAR | Status: AC
  Filled 2022-03-21: qty 5

## 2022-03-21 MED ORDER — LACTATED RINGERS IV SOLN: INTRAVENOUS | Status: DC

## 2022-03-21 MED ORDER — MIDAZOLAM HCL 2 MG/2ML IJ SOLN
INTRAMUSCULAR | Status: DC | PRN
  Administered 2022-03-21: 2 mg via INTRAVENOUS

## 2022-03-21 MED ORDER — PROMETHAZINE HCL 25 MG/ML IJ SOLN: 6.2500 mg | INTRAMUSCULAR | Status: DC | PRN

## 2022-03-21 MED ORDER — CHLORHEXIDINE GLUCONATE 0.12 % MT SOLN
15.0000 mL | OROMUCOSAL | Status: AC
  Administered 2022-03-21: 15 mL via OROMUCOSAL
  Filled 2022-03-21: qty 15

## 2022-03-21 MED ORDER — POVIDONE-IODINE 10 % EX SWAB: 2.0000 | Freq: Once | CUTANEOUS | Status: DC

## 2022-03-21 SURGICAL SUPPLY — 71 items
ANCHOR FBRTK 2.6 SUTURETAP 1.3 (Anchor) ×1 IMPLANT
ANCHOR SWIVELOCK BIO 4.75X19.1 (Anchor) ×2 IMPLANT
BAG COUNTER SPONGE SURGICOUNT (BAG) ×2 IMPLANT
BLADE EXCALIBUR 4.0X13 (MISCELLANEOUS) IMPLANT
BLADE SHAVER TORPEDO 4X13 (MISCELLANEOUS) ×1 IMPLANT
BLADE SURG 11 STRL SS (BLADE) ×2 IMPLANT
BURR OVAL 8 FLU 4.0X13 (MISCELLANEOUS) ×1 IMPLANT
COVER SURGICAL LIGHT HANDLE (MISCELLANEOUS) ×2 IMPLANT
DRAPE INCISE IOBAN 66X45 STRL (DRAPES) ×4 IMPLANT
DRAPE STERI 35X30 U-POUCH (DRAPES) ×2 IMPLANT
DRAPE U-SHAPE 47X51 STRL (DRAPES) ×4 IMPLANT
DRSG PAD ABDOMINAL 8X10 ST (GAUZE/BANDAGES/DRESSINGS) ×6 IMPLANT
DRSG TEGADERM 4X4.5 CHG (GAUZE/BANDAGES/DRESSINGS) ×4 IMPLANT
DRSG TEGADERM 4X4.75 (GAUZE/BANDAGES/DRESSINGS) ×8 IMPLANT
DRSG XEROFORM 1X8 (GAUZE/BANDAGES/DRESSINGS) ×1 IMPLANT
DURAPREP 26ML APPLICATOR (WOUND CARE) ×2 IMPLANT
ELECT PENCIL ROCKER SW 15FT (MISCELLANEOUS) ×1 IMPLANT
ELECT REM PT RETURN 9FT ADLT (ELECTROSURGICAL) ×2
ELECTRODE REM PT RTRN 9FT ADLT (ELECTROSURGICAL) ×1 IMPLANT
FILTER STRAW FLUID ASPIR (MISCELLANEOUS) ×2 IMPLANT
GAUZE SPONGE 4X4 12PLY STRL (GAUZE/BANDAGES/DRESSINGS) ×1 IMPLANT
GAUZE SPONGE 4X4 12PLY STRL LF (GAUZE/BANDAGES/DRESSINGS) ×2 IMPLANT
GAUZE XEROFORM 1X8 LF (GAUZE/BANDAGES/DRESSINGS) ×2 IMPLANT
GLOVE BIOGEL PI IND STRL 8 (GLOVE) ×1 IMPLANT
GLOVE BIOGEL PI INDICATOR 8 (GLOVE) ×1
GLOVE ECLIPSE 8.0 STRL XLNG CF (GLOVE) ×2 IMPLANT
GOWN STRL REUS W/ TWL LRG LVL3 (GOWN DISPOSABLE) ×3 IMPLANT
GOWN STRL REUS W/TWL LRG LVL3 (GOWN DISPOSABLE) ×3
KIT BASIN OR (CUSTOM PROCEDURE TRAY) ×2 IMPLANT
KIT TURNOVER KIT B (KITS) ×2 IMPLANT
MANIFOLD NEPTUNE II (INSTRUMENTS) ×2 IMPLANT
NDL HYPO 25X1 1.5 SAFETY (NEEDLE) ×1 IMPLANT
NDL SCORPION MULTI FIRE (NEEDLE) IMPLANT
NDL SPNL 18GX3.5 QUINCKE PK (NEEDLE) ×1 IMPLANT
NDL SUT 6 .5 CRC .975X.05 MAYO (NEEDLE) IMPLANT
NEEDLE HYPO 25X1 1.5 SAFETY (NEEDLE) ×2 IMPLANT
NEEDLE MAYO TAPER (NEEDLE) ×1
NEEDLE SCORPION MULTI FIRE (NEEDLE) ×2 IMPLANT
NEEDLE SPNL 18GX3.5 QUINCKE PK (NEEDLE) ×2 IMPLANT
NS IRRIG 1000ML POUR BTL (IV SOLUTION) ×2 IMPLANT
PACK SHOULDER (CUSTOM PROCEDURE TRAY) ×2 IMPLANT
PAD ARMBOARD 7.5X6 YLW CONV (MISCELLANEOUS) ×4 IMPLANT
PORT APPOLLO RF 90DEGREE MULTI (SURGICAL WAND) IMPLANT
PROBE APOLLO 90XL (SURGICAL WAND) ×1 IMPLANT
RESTRAINT HEAD UNIVERSAL NS (MISCELLANEOUS) ×2 IMPLANT
SLING ARM IMMOBILIZER LRG (SOFTGOODS) ×1 IMPLANT
SLING ARM IMMOBILIZER MED (SOFTGOODS) IMPLANT
SPONGE T-LAP 4X18 ~~LOC~~+RFID (SPONGE) ×4 IMPLANT
STRIP CLOSURE SKIN 1/2X4 (GAUZE/BANDAGES/DRESSINGS) ×2 IMPLANT
SUCTION FRAZIER HANDLE 10FR (MISCELLANEOUS) ×1
SUCTION TUBE FRAZIER 10FR DISP (MISCELLANEOUS) ×1 IMPLANT
SUT 0 FIBERLOOP 38 BLUE TPR ND (SUTURE) ×2
SUT ETHILON 3 0 PS 1 (SUTURE) ×2 IMPLANT
SUT FIBERWIRE #2 38 T-5 BLUE (SUTURE)
SUT MNCRL AB 3-0 PS2 18 (SUTURE) ×2 IMPLANT
SUT VIC AB 0 CT1 27 (SUTURE) ×1
SUT VIC AB 0 CT1 27XBRD ANBCTR (SUTURE) ×1 IMPLANT
SUT VIC AB 1 CT1 27 (SUTURE) ×1
SUT VIC AB 1 CT1 27XBRD ANBCTR (SUTURE) ×1 IMPLANT
SUT VIC AB 2-0 CT1 27 (SUTURE) ×1
SUT VIC AB 2-0 CT1 TAPERPNT 27 (SUTURE) ×1 IMPLANT
SUT VICRYL 0 UR6 27IN ABS (SUTURE) IMPLANT
SUTURE 0 FIBERLP 38 BLU TPR ND (SUTURE) IMPLANT
SUTURE FIBERWR #2 38 T-5 BLUE (SUTURE) IMPLANT
SYR 20ML LL LF (SYRINGE) ×4 IMPLANT
SYR 3ML LL SCALE MARK (SYRINGE) ×2 IMPLANT
SYR TB 1ML LUER SLIP (SYRINGE) ×2 IMPLANT
TOWEL GREEN STERILE (TOWEL DISPOSABLE) ×2 IMPLANT
TOWEL GREEN STERILE FF (TOWEL DISPOSABLE) ×2 IMPLANT
TUBING ARTHROSCOPY IRRIG 16FT (MISCELLANEOUS) ×2 IMPLANT
WATER STERILE IRR 1000ML POUR (IV SOLUTION) ×2 IMPLANT

## 2022-03-21 NOTE — Progress Notes (Signed)
Patient reports she is ready to go home feels more awake eyes are open reports no further nausea

## 2022-03-21 NOTE — Progress Notes (Signed)
Patient up to recliner c/o nausea medication received order received from Dr. Bradley Ferris patient encouraged to keep eyes open

## 2022-03-21 NOTE — Op Note (Signed)
NAMEMADESYN, AST MEDICAL RECORD NO: [id number] ACCOUNT NO: [id number] DATE OF BIRTH: 1970/09/02 FACILITY: [facility] LOCATION: [facility]-PERIOP PHYSICIAN: Graylin Shiver. 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  POSTOPERATIVE DIAGNOSIS:  Mild adhesive capsulitis with AC joint arthritis and small full-thickness rotator cuff tear at the posterior aspect of the supraspinatus.  PROCEDURE:  Left shoulder examination under anesthesia with arthroscopy and debridement and revision distal clavicle excision, arthroscopic as well as mini open rotator cuff tear repair.  SURGEON:  Graylin Shiver. August Saucer, MD.  ASSISTANT:  April Chilton Si, RNFA.  INDICATIONS:  The patient is a 51 year old patient with left shoulder pain who presents for operative management after explanation of risks and benefits.  Preoperative MRI scan showed some edema within the [facility] joint as well as rotator cuff tendinosis.   She presents now for operative management after explanation of risks, benefits.  DESCRIPTION OF PROCEDURE:  The patient was brought to the operating room where general anesthetic was induced.  Preoperative antibiotics administered.  Timeout was called.  Right and left shoulder were examined under anesthesia.  Right shoulder had about  85 degrees of external rotation and the left about 60.  The patient's left arm was then manipulated into full forward flexion, abduction and external rotation. After rotator interval, release the external rotation was symmetric with the right hand side.   The patient was then placed in the beach chair position with head in neutral position.  Left arm, shoulder and hand prescrubbed with alcohol and Betadine, allowed to air dry, prepped with DuraPrep solution and draped in sterile manner.  Ioban used to  seal the operative field.  Timeout was called.  Posterior portal created.  Anterior portal created under  direct visualization in line with the distal clavicle.  Rotator cuff did have a small full thickness tear with degenerative appearing tissue which  had significant tendinosis at the posterior aspect of the supraspinatus.  The glenohumeral articular surfaces were intact.  Prior biceps tenotomy had been performed.  Through the anterior portal, the superior labrum was debrided.  Rotator cuff tear was  inspected and found to be a full-thickness tear with degenerative tissue surrounding the tear.  The tear itself on visual inspection measured about 5 x 5 mm.  Once debrided was about 1.5 x 1.5 cm.  Following debridement, the scope was placed into  subacromial space.  Lateral portal was created.  Bursectomy performed. The arthroscopic distal clavicle excision was revised about 4 more millimeters for complete decompression.  The subacromial space was then irrigated and instruments were removed.   Portals were closed using 3-0 nylon.  Ioban then used to cover the entire operative field.  The skin and subcutaneous tissue sharply divided.  Deltoid was split and measured distance of 4 cm from the anterolateral margin of the acromion.  Stay suture  placed.  A bursectomy performed.  Rotator cuff tear was identified.  Distal clavicle resection was palpated and found to be adequately resected, slightly more anteriorly than posteriorly.  Rotator cuff tear was then identified.  Devitalized tendinous  tissue was debrided.  There was a cleavage tear in the tendon, which was closed using 0 FiberWire suture, which was incorporated into the repair.  Following debridement, the footprint was prepared using a rongeur and a knife down to bleeding bone.  One  Arthrex suture tape anchor was placed with the 2 suture tapes then being placed through the rotator cuff  using a Scorpion.  Those 4 limbs were tied x2 knots.  Limbs were crossed to bring a nice solid repair down to the tendon.  A 3-0 Vicryl sutures were  placed at the edge of the  tendon to make a watertight repair.  The 2 tapes and 2 Vicryl sutures were placed posteriorly, 2 tapes and 1 Vicryl suture, and one 0 FiberWire suture placed anteriorly into [place], Watertight repair achieved.  Subacromial  decompression was again performed using a manual rasp.  Next, the thorough irrigation was performed.  The deltoid split was closed using #1 Vicryl suture followed by interrupted inverted 0 Vicryl suture, 2-0 Vicryl suture, and 3-0 Monocryl.  Steri-Strips  and impervious dressings placed.  A shoulder immobilizer placed.  The patient tolerated the procedure well without immediate complications and transferred to recovery room in stable condition.   PUS D: 03/21/2022 10:41:25 am T: 03/21/2022 1:46:00 pm  JOB: [id number]/ [id number]

## 2022-03-21 NOTE — H&P (Signed)
Suzanne Lawrence is an 51 y.o. female.   Chief Complaint: Left shoulder pain HPI: Suzanne Lawrence is a 51 year old patient with left shoulder pain.  Has had pain for 3 years.  Does not recall specific injury.  She is right-hand dominant.  Pain wakes her from sleep at night.  Pain continues to worsen.  Reports radiating type pain from the biceps with spasm.  Describes grinding and popping and locking.  Also reports some neck pain as well as scapular pain.  She describes decreased range of motion due to stiffness and pain in that left shoulder.  She has to assist the left arm up with the right arm.  Patient had left shoulder surgery in May of last year.  Notes from that physician are reviewed.  She had arthroscopy debridement with distal clavicle excision and open subpectoral biceps tenodesis performed at that time.  Her postop course was complicated by subclavian vein thrombosis treated with Eliquis.  She was referred to a physician at [facility] for second opinion who recommended biceps exploration arthroscopy debridement and distal clavicle excision.  She did have good relief from intra-articular injection as part of that work-up and treatment at [facility].  MRI scan done this year demonstrates tendinosis of the supraspinatus and infraspinatus with thickening of the inferior axillary recess consistent with early adhesive capsulitis along with some AC joint arthrosis and fluid in that joint.  Past Medical History:  Diagnosis Date   Abnormal Pap smear of cervix    Anemia    issue with red blood cells being small   Anxiety    Arthritis    Carpal tunnel syndrome    right   Depression    DVT (deep venous thrombosis) (HCC) 03/2021   left arm   GERD (gastroesophageal reflux disease)    High blood pressure    History of hiatal hernia    Left breast mass    Pre-diabetes     Past Surgical History:  Procedure Laterality Date   BIOPSY  02/05/2022   Procedure: BIOPSY;  Surgeon: Lanelle Bal, DO;   [facility];  Service: Endoscopy;;   COLONOSCOPY  2022   Megan Straughan: 7 polyps removed, has to return in one year   COLONOSCOPY  09/2021   Megan Straughan: 12 polyps removed   ESOPHAGOGASTRODUODENOSCOPY (EGD) WITH PROPOFOL N/A 02/05/2022   Procedure: ESOPHAGOGASTRODUODENOSCOPY (EGD) WITH PROPOFOL;  Surgeon: Lanelle Bal, DO;  Location: [facility];  Service: Endoscopy;  Laterality: N/A;  2:00pm   left shoulder arthroscopy  12/2020   TUBAL LIGATION      Family History  Problem Relation Age of Onset   Lung cancer Mother    Other Father        multiple issues, doesn't know history for sure   Prostate cancer Father    Stroke Father    Aneurysm Paternal Grandmother    Breast cancer Maternal Aunt    Prostate cancer Maternal Uncle    Alcoholism Maternal Uncle    Aneurysm Cousin        paternal side   Aneurysm Cousin        paternal side    Breast cancer Cousin        maternal side    High blood pressure Other        father's side    Cerebral aneurysm Other        cerebral, on father's side    Alcoholism Other        maternal side  Colon cancer Neg Hx    Social History:  reports that she has been smoking cigarettes. She has been smoking an average of .5 packs per day. She has never used smokeless tobacco. She reports that she does not currently use alcohol. She reports that she does not currently use drugs.  Allergies:  Allergies  Allergen Reactions   Ferumoxytol Other (See Comments)    Hot flash, nausea, chest pressure    Hydrochlorothiazide Other (See Comments)     "urinary frequency"; was going every 20 minutes and was causing problems at work    Medications Prior to Admission  Medication Sig Dispense Refill   acetaminophen (TYLENOL) 325 MG tablet Take 650 mg by mouth every 6 (six) hours as needed for moderate pain.     ARIPiprazole (ABILIFY) 10 MG tablet Take 10 mg by mouth daily.     Ascorbic Acid (VITAMIN C PO) Take 1 tablet by mouth daily.      atorvastatin (LIPITOR) 10 MG tablet Take 10 mg by mouth at bedtime.     baclofen (LIORESAL) 10 MG tablet Take 10 mg by mouth 3 (three) times daily as needed (Back pain).     chlorthalidone (HYGROTON) 25 MG tablet Take 25 mg by mouth every morning.     cholecalciferol (VITAMIN D) 25 MCG (1000 UNIT) tablet Take 1,000 Units by mouth daily.     DULoxetine (CYMBALTA) 60 MG capsule Take 60 mg by mouth daily in the afternoon.     famotidine (PEPCID) 40 MG tablet Take 40 mg by mouth daily.     Ferrous Gluconate-C-Folic Acid (IRON-C PO) Take 60 mg by mouth daily.     gabapentin (NEURONTIN) 300 MG capsule Take 300 mg by mouth at bedtime.     lisinopril (ZESTRIL) 40 MG tablet Take 40 mg by mouth every morning.     meclizine (ANTIVERT) 25 MG tablet Take 25 mg by mouth 3 (three) times daily as needed for dizziness.     metFORMIN (GLUCOPHAGE-XR) 500 MG 24 hr tablet Take 500 mg by mouth 2 (two) times daily before a meal.     omeprazole (PRILOSEC) 40 MG capsule Take 40 mg by mouth daily.     sucralfate (CARAFATE) 1 g tablet Take 1 g by mouth daily as needed (Acid reflux).     traZODone (DESYREL) 50 MG tablet Take 50 mg by mouth at bedtime as needed for sleep.      Results for orders placed or performed during the hospital encounter of 03/21/22 (from the past 48 hour(s))  Pregnancy, urine POC     Status: None   Collection Time: 03/21/22  5:49 AM  Result Value Ref Range   Preg Test, Ur NEGATIVE NEGATIVE    Comment:        THE SENSITIVITY OF THIS METHODOLOGY IS >24 mIU/mL    No results found.  Review of Systems  Musculoskeletal:  Positive for arthralgias.  All other systems reviewed and are negative.   Blood pressure (!) 172/91, pulse 74, temperature 98.9 F (37.2 C), temperature source Oral, resp. rate 18, height 5\' 1"  (1.549 m), weight 81.6 kg, SpO2 98 %. Physical Exam Vitals reviewed.  HENT:     Head: Normocephalic.     Nose: Nose normal.     Mouth/Throat:     Mouth: Mucous membranes are  moist.  Eyes:     Pupils: Pupils are equal, round, and reactive to light.  Cardiovascular:     Rate and Rhythm: Normal rate.  Pulses: Normal pulses.  Pulmonary:     Effort: Pulmonary effort is normal.  Abdominal:     General: Abdomen is flat.  Musculoskeletal:     Cervical back: Normal range of motion.  Skin:    General: Skin is warm.     Capillary Refill: Capillary refill takes less than 2 seconds.  Neurological:     General: No focal deficit present.     Mental Status: She is alert.  Psychiatric:        Mood and Affect: Mood normal.    Ortho exam demonstrates on the left shoulder restricted passive range of motion of 35/75/140.  She has reasonable rotator cuff strength infraspinatus extremity subscap muscle testing.  Has definitive AC joint tenderness left versus right as well as pain with crossarm adduction.  Not too much coarse grinding or crepitus present.  She has some pectoral biceps tenodesis incision which is well-healed and nontender and her bicep contours are asymmetric.  Distally the biceps tendon is intact  Assessment/Plan Impression is left shoulder pain with multifactorial origins.  I think it is possible and likely that the biceps tenodesis is giving her significant pain and functional debilitation.  If that has failed I think it is unlikely that it can be restored at this point 1 year after surgery so exploration of that symptomatic biceps muscle is unlikely to provide predictable relief.  The activity related spasms she is having associated with her postoperative biceps tenodesis whether it is currently functional or nonfunctional is  something she is going to have to live with.  She also has a component of adhesive capsulitis affecting her shoulder.  I think that is something that could potentially be treated with arthroscopic rotator interval release along with manipulation and subsequent CPM use for the first 2 weeks after surgery.  I also agree that the [facility] joint  status post resection could be  clinically symptomatic.  MRI scan review does reveal some edema but overall the resection appears adequate.  Structural features around the shoulder could make that difficult to do arthroscopically.  Patient understands about the risk and benefits of recurrent surgery on that left shoulder and the less than predictable outcomes from such an intervention.  Nonetheless she would like to have an intervention to try to improve her shoulder symptoms.  I think it is possible but unlikely that she has developed rotator cuff pathology that would require treatment since her MRI scan earlier this year.  Plan is shoulder arthroscopy with rotator interval release manipulation and possible revision AC joint resection.  Plan to use CPM machine for 6 hours a day for after surgery for the first 2 weeks.  All questions answered    Burnard Bunting, MD 03/21/2022, 6:44 AM

## 2022-03-21 NOTE — Transfer of Care (Signed)
Immediate Anesthesia Transfer of Care Note  Patient: Suzanne Lawrence  Procedure(s) Performed: LEFT SHOULDER ARTHOSCOPY, DEBRIDEMENT,  MANIPULATION, ROTATOR CUFF REPAIR, ARTHROSCOPIC DISTAL CLAVICLE EXCISION (Left)  Patient Location: PACU  Anesthesia Type:GA combined with regional for post-op pain  Level of Consciousness: drowsy and patient cooperative  Airway & Oxygen Therapy: Patient Spontanous Breathing and Patient connected to nasal cannula oxygen  Post-op Assessment: Report given to RN, Post -op Vital signs reviewed and stable and Patient moving all extremities X 4  Post vital signs: Reviewed and stable  Last Vitals:  Vitals Value Taken Time  BP 143/75 03/21/22 1045  Temp 36.4 C 03/21/22 1045  Pulse 75 03/21/22 1050  Resp 17 03/21/22 1050  SpO2 93 % 03/21/22 1050  Vitals shown include unvalidated device data.  Last Pain:  Vitals:   03/21/22 1045  TempSrc:   PainSc: Asleep      Patients Stated Pain Goal: 3 (03/21/22 3790)  Complications: No notable events documented.

## 2022-03-21 NOTE — Anesthesia Preprocedure Evaluation (Addendum)
Anesthesia Evaluation  Patient identified by MRN, date of birth, ID band Patient awake    Reviewed: Allergy & Precautions, NPO status , Patient's Chart, lab work & pertinent test results  Airway Mallampati: II  TM Distance: >3 FB Neck ROM: Full    Dental no notable dental hx.    Pulmonary Current Smoker and Patient abstained from smoking.,    Pulmonary exam normal        Cardiovascular hypertension, Pt. on medications + DVT  Normal cardiovascular exam     Neuro/Psych PSYCHIATRIC DISORDERS Anxiety Depression  Neuromuscular disease    GI/Hepatic Neg liver ROS, hiatal hernia, GERD  Medicated and Controlled,  Endo/Other  negative endocrine ROS  Renal/GU negative Renal ROS     Musculoskeletal  (+) Arthritis ,   Abdominal (+) + obese,   Peds  Hematology negative hematology ROS (+)   Anesthesia Other Findings left frozen shoulder, acromioclavicular osteoarthritis  Reproductive/Obstetrics hcg negative                            Anesthesia Physical Anesthesia Plan  ASA: 2  Anesthesia Plan: Regional and General   Post-op Pain Management: Regional block*   Induction: Intravenous  PONV Risk Score and Plan: 2 and Ondansetron, Dexamethasone, Midazolam and Treatment may vary due to age or medical condition  Airway Management Planned: Oral ETT  Additional Equipment:   Intra-op Plan:   Post-operative Plan: Extubation in OR  Informed Consent: I have reviewed the patients History and Physical, chart, labs and discussed the procedure including the risks, benefits and alternatives for the proposed anesthesia with the patient or authorized representative who has indicated his/her understanding and acceptance.     Dental advisory given  Plan Discussed with: CRNA  Anesthesia Plan Comments:        Anesthesia Quick Evaluation

## 2022-03-21 NOTE — Brief Op Note (Signed)
   03/21/2022  10:34 AM  PATIENT:  Suzanne Lawrence  51 y.o. female  PRE-OPERATIVE DIAGNOSIS:  left frozen shoulder, acromioclavicular osteoarthritis  POST-OPERATIVE DIAGNOSIS:  left frozen shoulder, acromioclavicular osteoarthritis,rotator cuff tear  PROCEDURE:  Procedure(s): LEFT SHOULDER ARTHOSCOPY, DEBRIDEMENT,  MANIPULATION, ROTATOR CUFF REPAIR, ARTHROSCOPIC DISTAL CLAVICLE EXCISION  SURGEON:  Surgeon(s): August Saucer, Corrie Mckusick, MD  ASSISTANT: green rnfa  ANESTHESIA:   general  EBL: 20 ml    Total I/O In: 100 [IV Piggyback:100] Out: 30 [Blood:30]  BLOOD ADMINISTERED: none  DRAINS: none   LOCAL MEDICATIONS USED:  none  SPECIMEN:  No Specimen  COUNTS:  YES  TOURNIQUET:  * No tourniquets in log *  DICTATION: .Other Dictation: Dictation Number done  PLAN OF CARE: Discharge to home after PACU  PATIENT DISPOSITION:  PACU - hemodynamically stable DIAGNOSES: Left shoulder, chronic rotator cuff tear and AC arthritis.  POST-OPERATIVE DIAGNOSIS: same  PROCEDURE: Open repair chronic rotator cuff tear - 63785 Arthroscopic limited debridement - 88502 Arthroscopic distal clavicle excision - 77412   OPERATIVE FINDING: Exam under anesthesia:  Slight restriction of external rotation and isolated glenohumeral abduction on the left compared to the right Articular space: Normal Chondral surfaces: Normal Biceps:  Previously tenodesed by another surgeon Subscapularis: Intact  Supraspinatus: Complete tear -1-1/2 x 1-1/2 cm after debridement Infraspinatus: Intact

## 2022-03-21 NOTE — Anesthesia Postprocedure Evaluation (Signed)
Anesthesia Post Note  Patient: Suzanne Lawrence  Procedure(s) Performed: LEFT SHOULDER ARTHOSCOPY, DEBRIDEMENT,  MANIPULATION, ROTATOR CUFF REPAIR, ARTHROSCOPIC DISTAL CLAVICLE EXCISION (Left)     Patient location during evaluation: PACU Anesthesia Type: Regional and General Level of consciousness: awake Pain management: pain level controlled Vital Signs Assessment: post-procedure vital signs reviewed and stable Respiratory status: spontaneous breathing, nonlabored ventilation, respiratory function stable and patient connected to nasal cannula oxygen Cardiovascular status: blood pressure returned to baseline and stable Postop Assessment: no apparent nausea or vomiting Anesthetic complications: no   No notable events documented.  Last Vitals:  Vitals:   03/21/22 1115 03/21/22 1120  BP: (!) 149/84 138/85  Pulse: 78 81  Resp: (!) 21 (!) 23  Temp:    SpO2: 91% 93%    Last Pain:  Vitals:   03/21/22 1120  TempSrc:   PainSc: 0-No pain                 Prentiss Polio P Dreyden Rohrman

## 2022-03-21 NOTE — Anesthesia Procedure Notes (Signed)
Procedure Name: Intubation Date/Time: 03/21/2022 7:49 AM  Performed by: Michele Rockers, CRNAPre-anesthesia Checklist: Patient identified, Patient being monitored, Timeout performed, Emergency Drugs available and Suction available Patient Re-evaluated:Patient Re-evaluated prior to induction Oxygen Delivery Method: Circle system utilized Preoxygenation: Pre-oxygenation with 100% oxygen Induction Type: IV induction Ventilation: Mask ventilation without difficulty and Oral airway inserted - appropriate to patient size Laryngoscope Size: Mac, 3 and Glidescope Grade View: Grade I Tube type: Oral Tube size: 7.0 mm Number of attempts: 2 Airway Equipment and Method: Stylet Placement Confirmation: ETT inserted through vocal cords under direct vision, positive ETCO2 and breath sounds checked- equal and bilateral Secured at: 21 cm Tube secured with: Tape Dental Injury: Teeth and Oropharynx as per pre-operative assessment  Difficulty Due To: Difficult Airway- due to large tongue Comments: Barely visualized VC with Mill 2. Mac 3 with glide was feasible

## 2022-03-21 NOTE — Anesthesia Procedure Notes (Signed)
Anesthesia Regional Block: Interscalene brachial plexus block   Pre-Anesthetic Checklist: , timeout performed,  Correct Patient, Correct Site, Correct Laterality,  Correct Procedure, Correct Position, site marked,  Risks and benefits discussed,  Surgical consent,  Pre-op evaluation,  At surgeon's request and post-op pain management  Laterality: Left  Prep: chloraprep       Needles:  Injection technique: Single-shot  Needle Type: Echogenic Stimulator Needle     Needle Length: 9cm  Needle Gauge: 21     Additional Needles:   Procedures:,,,, ultrasound used (permanent image in chart),,    Narrative:  Start time: 03/21/2022 7:15 AM End time: 03/21/2022 7:25 AM Injection made incrementally with aspirations every 5 mL.  Performed by: Personally  Anesthesiologist: Leonides Grills, MD  Additional Notes: Functioning IV was confirmed and monitors were applied.  A timeout was performed. Sterile prep, hand hygiene and sterile gloves were used. A 12mm 21ga Arrow echogenic stimulator needle was used. Negative aspiration and negative test dose prior to incremental administration of local anesthetic. The patient tolerated the procedure well.  Ultrasound guidance: relevent anatomy identified, needle position confirmed, local anesthetic spread visualized around nerve(s), vascular puncture avoided.  Image printed for medical record.

## 2022-03-22 ENCOUNTER — Encounter (HOSPITAL_COMMUNITY): Payer: Self-pay | Admitting: Orthopedic Surgery

## 2022-03-22 ENCOUNTER — Telehealth: Payer: Self-pay | Admitting: Orthopedic Surgery

## 2022-03-22 NOTE — Telephone Encounter (Signed)
Pt called requesting a call back. Pt stats she does not have a CPM machine. Please call pt about this matter at [phone].

## 2022-03-24 DIAGNOSIS — M75122 Complete rotator cuff tear or rupture of left shoulder, not specified as traumatic: Secondary | ICD-10-CM

## 2022-03-24 DIAGNOSIS — M19012 Primary osteoarthritis, left shoulder: Secondary | ICD-10-CM

## 2022-03-25 ENCOUNTER — Telehealth: Payer: Self-pay | Admitting: Orthopedic Surgery

## 2022-03-25 NOTE — Telephone Encounter (Signed)
Please call patient in regard to her bandages.  She had left shoulder arthroscopy with rotation interval release on 03-21-22  with Dr. August Saucer.  Her post op visit is Thursday (03-21-22).  She doesn't recall her instructions.

## 2022-03-25 NOTE — Telephone Encounter (Signed)
IC patient back. She was asking about how long to keep bandages on. I advised to keep intact for at least 7 days then could d/c and cover with bandaid.

## 2022-03-26 ENCOUNTER — Telehealth: Payer: Self-pay | Admitting: Orthopedic Surgery

## 2022-03-26 NOTE — Telephone Encounter (Signed)
Patient called stating that she recently had surgery on 7/27 for her left shoulder. She said her shoulder is throbbing and it's very painful. If she could get a call back with some options on what may help her number is [phone]

## 2022-03-27 NOTE — Telephone Encounter (Signed)
Called and spoke with her. Pain improving, coming in for appt tomorrow

## 2022-03-28 ENCOUNTER — Encounter: Payer: Self-pay | Admitting: Orthopedic Surgery

## 2022-03-28 ENCOUNTER — Ambulatory Visit (INDEPENDENT_AMBULATORY_CARE_PROVIDER_SITE_OTHER): Payer: 59 | Admitting: Surgical

## 2022-03-28 DIAGNOSIS — Z9889 Other specified postprocedural states: Secondary | ICD-10-CM

## 2022-03-28 NOTE — Progress Notes (Signed)
Post-Op Visit Note   Patient: Suzanne Lawrence           Date of Birth: [birth year]           MRN: [id number] Visit Date: 03/28/2022 PCP: [facility]   Assessment & Plan:  Chief Complaint:  Chief Complaint  Patient presents with   Left Shoulder - Routine Post Op   Visit Diagnoses:  1. Status post rotator cuff repair     Plan: Suzanne Lawrence is a 51 y.o. female who presents s/p left shoulder rotator cuff repair and distal clavicle excision on 03/21/2022.  Patient is doing well and pain is overall controlled.  Up to 50 degrees on CPM machine which she just received yesterday.  Denies any chest pain, SOB, fevers, chills. Taking pain medication every 8 hours.    On exam,  Intact EPL, FPL, finger abduction, finger adduction, pronation/supination, bicep, tricep, deltoid of operative extremity.  Axillary nerve intact with deltoid firing.  Incisions are healing well without evidence of infection or dehiscence.  Sutures removed and replaced with Steri-Strips today.  2+ radial pulse of the operative extremity  Plan is continue with CPM machine to work on passive motion.  Encourage patient to avoid lifting the arm by itself or lifting anything with the arm.  Follow-up for clinical recheck in 2 weeks to recheck range of motion and initiate physical therapy..   Follow-Up Instructions: No follow-ups on file.   Orders:  No orders of the defined types were placed in this encounter.  No orders of the defined types were placed in this encounter.   Imaging: No results found.  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 01/25/2020   High blood  pressure    Depression    Carpal tunnel syndrome    Anxiety    Past Medical History:  Diagnosis Date   Abnormal Pap smear of cervix    Anemia    issue with red blood cells being small   Anxiety    Arthritis    Carpal tunnel syndrome    right   Depression    DVT (deep venous thrombosis) (HCC) 03/2021   left arm   GERD (gastroesophageal reflux disease)    High blood pressure    History of hiatal hernia    Left breast mass    Pre-diabetes     Family History  Problem Relation Age of Onset   Lung cancer Mother    Other Father        multiple issues, doesn't know history for sure   Prostate cancer Father    Stroke Father    Aneurysm Paternal Grandmother    Breast cancer Maternal Aunt    Prostate cancer Maternal Uncle    Alcoholism Maternal Uncle    Aneurysm Cousin        paternal side   Aneurysm Cousin        paternal side    Breast cancer Cousin        maternal side    High blood pressure Other        father's side    Cerebral aneurysm Other  cerebral, on father's side    Alcoholism Other        maternal side    Colon cancer Neg Hx     Past Surgical History:  Procedure Laterality Date   BIOPSY  02/05/2022   Procedure: BIOPSY;  Surgeon: Lanelle Bal, DO;  [facility]: [facility];  Service: Endoscopy;;   COLONOSCOPY  2022   Megan Straughan: 7 polyps removed, has to return in one year   COLONOSCOPY  09/2021   Megan Straughan: 12 polyps removed   ESOPHAGOGASTRODUODENOSCOPY (EGD) WITH PROPOFOL N/A 02/05/2022   Procedure: ESOPHAGOGASTRODUODENOSCOPY (EGD) WITH PROPOFOL;  Surgeon: Lanelle Bal, DO;  [facility]: [facility];  Service: Endoscopy;  Laterality: N/A;  2:00pm   left shoulder arthroscopy  12/2020   SHOULDER ARTHROSCOPY WITH OPEN ROTATOR CUFF REPAIR AND DISTAL CLAVICLE ACROMINECTOMY Left 03/21/2022   Procedure: LEFT SHOULDER ARTHOSCOPY, DEBRIDEMENT,  MANIPULATION, ROTATOR CUFF REPAIR, ARTHROSCOPIC DISTAL CLAVICLE EXCISION;  Surgeon: Cammy Copa, MD;  Location: [facility];  Service: Orthopedics;  Laterality: Left;   TUBAL LIGATION     Social History   Occupational History   Not on file  Tobacco Use   Smoking status: Every Day    Packs/day: 0.50    Types: Cigarettes   Smokeless tobacco: Never  Vaping Use   Vaping Use: Never used  Substance and Sexual Activity   Alcohol use: Not Currently   Drug use: Not Currently   Sexual activity: Yes    Birth control/protection: None

## 2022-04-11 ENCOUNTER — Ambulatory Visit (INDEPENDENT_AMBULATORY_CARE_PROVIDER_SITE_OTHER): Payer: 59 | Admitting: Surgical

## 2022-04-11 ENCOUNTER — Encounter: Payer: Self-pay | Admitting: Orthopedic Surgery

## 2022-04-11 DIAGNOSIS — Z9889 Other specified postprocedural states: Secondary | ICD-10-CM

## 2022-04-11 NOTE — Progress Notes (Signed)
Post-Op Visit Note   Patient: Suzanne Lawrence           Date of Birth: 11-May-1971           MRN: [id number] Visit Date: 04/11/2022 PCP: [facility]   Assessment & Plan:  Chief Complaint:  Chief Complaint  Patient presents with   Left Shoulder - Follow-up   Visit Diagnoses:  1. Status post rotator cuff repair     Plan: Patient is a 51 year old female who presents s/p left shoulder rotator cuff tear repair and distal clavicle excision on 03/21/2022.  Doing well overall and feels better than her last appointment.  She is up to 102 degrees on the CPM machine.  Taking ibuprofen and Tylenol for pain control.  She feels the sling is causing significant discomfort.  She may discontinue sling today.  Did recommend that she continue to use it when she is out of the house for long periods of time.  Continue with CPM machine.  She will start physical therapy in [place] to focus on left shoulder passive range of motion and active assisted range of motion.  Okay for full active range of motion at 6 weeks out from procedure along with initiation of rotator cuff strengthening exercises.  Incisions look to be healing well on exam today.  Axillary nerve intact with deltoid firing.  About 25 degrees external rotation, 65 degrees abduction, 105 degrees forward flexion.  Intact EPL, FPL, finger abduction, finger adduction, pronation/supination, bicep, tricep, deltoid.  Follow-Up Instructions: No follow-ups on file.   Orders:  No orders of the defined types were placed in this encounter.  No orders of the defined types were placed in this encounter.   Imaging: No results found.  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 01/25/2020   High blood pressure    Depression    Carpal tunnel syndrome    Anxiety    Past Medical History:  Diagnosis Date   Abnormal Pap smear of cervix    Anemia    issue with red blood cells being small   Anxiety    Arthritis    Carpal tunnel syndrome    right   Depression    DVT (deep venous thrombosis) (HCC) 03/2021   left arm   GERD (gastroesophageal reflux disease)    High blood pressure    History of hiatal hernia    Left breast mass    Pre-diabetes     Family History  Problem Relation Age of Onset   Lung cancer Mother    Other Father        multiple issues, doesn't know history for sure   Prostate cancer Father    Stroke Father    Aneurysm Paternal Grandmother    Breast cancer Maternal Aunt    Prostate cancer Maternal Uncle    Alcoholism Maternal Uncle    Aneurysm Cousin        paternal side   Aneurysm Cousin        paternal side    Breast cancer Cousin        maternal side    High blood pressure Other  father's side    Cerebral aneurysm Other        cerebral, on father's side    Alcoholism Other        maternal side    Colon cancer Neg Hx     Past Surgical History:  Procedure Laterality Date   BIOPSY  02/05/2022   Procedure: BIOPSY;  Surgeon: Lanelle Bal, DO;  [facility];  Service: Endoscopy;;   COLONOSCOPY  2022   Megan Straughan: 7 polyps removed, has to return in one year   COLONOSCOPY  09/2021   Megan Straughan: 12 polyps removed   ESOPHAGOGASTRODUODENOSCOPY (EGD) WITH PROPOFOL N/A 02/05/2022   Procedure: ESOPHAGOGASTRODUODENOSCOPY (EGD) WITH PROPOFOL;  Surgeon: Lanelle Bal, DO;  [facility];  Service: Endoscopy;  Laterality: N/A;  2:00pm   left shoulder arthroscopy  12/2020   SHOULDER ARTHROSCOPY WITH OPEN ROTATOR CUFF REPAIR AND DISTAL CLAVICLE ACROMINECTOMY Left 03/21/2022   Procedure: LEFT SHOULDER ARTHOSCOPY, DEBRIDEMENT,   MANIPULATION, ROTATOR CUFF REPAIR, ARTHROSCOPIC DISTAL CLAVICLE EXCISION;  Surgeon: Cammy Copa, MD;  Location: [facility];  Service: Orthopedics;  Laterality: Left;   TUBAL LIGATION     Social History   Occupational History   Not on file  Tobacco Use   Smoking status: Every Day    Packs/day: 0.50    Types: Cigarettes   Smokeless tobacco: Never  Vaping Use   Vaping Use: Never used  Substance and Sexual Activity   Alcohol use: Not Currently   Drug use: Not Currently   Sexual activity: Yes    Birth control/protection: None

## 2022-04-16 ENCOUNTER — Other Ambulatory Visit: Payer: Self-pay

## 2022-04-16 ENCOUNTER — Telehealth: Payer: Self-pay | Admitting: Orthopedic Surgery

## 2022-04-16 DIAGNOSIS — Z9889 Other specified postprocedural states: Secondary | ICD-10-CM

## 2022-04-16 NOTE — Telephone Encounter (Signed)
Talked with patient. [facility] and asked for PT to be put in for [place].  I have placed order.

## 2022-04-16 NOTE — Telephone Encounter (Signed)
Patient called. She would like a referral for PT there in [place]. Her call back number is [phone]

## 2022-05-01 ENCOUNTER — Ambulatory Visit (HOSPITAL_COMMUNITY): Payer: BC Managed Care – PPO | Attending: Surgical

## 2022-05-01 ENCOUNTER — Encounter (HOSPITAL_COMMUNITY): Payer: Self-pay | Admitting: Hematology

## 2022-05-01 ENCOUNTER — Encounter (HOSPITAL_COMMUNITY): Payer: Self-pay

## 2022-05-01 DIAGNOSIS — M25512 Pain in left shoulder: Secondary | ICD-10-CM | POA: Insufficient documentation

## 2022-05-01 DIAGNOSIS — R29898 Other symptoms and signs involving the musculoskeletal system: Secondary | ICD-10-CM | POA: Diagnosis present

## 2022-05-01 NOTE — Therapy (Signed)
OUTPATIENT OCCUPATIONAL THERAPY ORTHO EVALUATION  Patient Name: Suzanne Lawrence MRN: [id number] DOB:July 09, 1971, 51 y.o., female Today's Date: 05/01/2022  PCP: [facility]  REFERRING PROVIDER: Harriette Bouillon, PA-C   OT End of Session - 05/01/22 1000     Visit Number 1    Number of Visits 16    Date for OT Re-Evaluation 06/28/22   Mini reassessment 10/4   Authorization Type BCBS    OT Start Time 0911    OT Stop Time 0946    OT Time Calculation (min) 35 min    Activity Tolerance Patient tolerated treatment well    Behavior During Therapy [facility] for tasks assessed/performed             Past Medical History:  Diagnosis Date   Abnormal Pap smear of cervix    Anemia    issue with red blood cells being small   Anxiety    Arthritis    Carpal tunnel syndrome    right   Depression    DVT (deep venous thrombosis) (HCC) 03/2021   left arm   GERD (gastroesophageal reflux disease)    High blood pressure    History of hiatal hernia    Left breast mass    Pre-diabetes    Past Surgical History:  Procedure Laterality Date   BIOPSY  02/05/2022   Procedure: BIOPSY;  Surgeon: Lanelle Bal, DO;  [facility];  Service: Endoscopy;;   COLONOSCOPY  2022   Megan Straughan: 7 polyps removed, has to return in one year   COLONOSCOPY  09/2021   Megan Straughan: 12 polyps removed   ESOPHAGOGASTRODUODENOSCOPY (EGD) WITH PROPOFOL N/A 02/05/2022   Procedure: ESOPHAGOGASTRODUODENOSCOPY (EGD) WITH PROPOFOL;  Surgeon: Lanelle Bal, DO;  [facility];  Service: Endoscopy;  Laterality: N/A;  2:00pm   left shoulder arthroscopy  12/2020   SHOULDER ARTHROSCOPY WITH OPEN ROTATOR CUFF REPAIR AND DISTAL CLAVICLE ACROMINECTOMY Left 03/21/2022   Procedure: LEFT SHOULDER ARTHOSCOPY, DEBRIDEMENT,  MANIPULATION, ROTATOR CUFF REPAIR, ARTHROSCOPIC DISTAL CLAVICLE EXCISION;  Surgeon: Cammy Copa, MD;  [facility];  Service: Orthopedics;  Laterality: Left;    TUBAL LIGATION     Patient Active Problem List   Diagnosis Date Noted   Arthritis of left acromioclavicular joint    Complete tear of left rotator cuff    Abdominal pain, epigastric 01/23/2022   Iron deficiency anemia 12/13/2021   Iron deficiency anemia due to chronic blood loss 12/13/2021   GERD (gastroesophageal reflux disease)    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 She reports having a previous shoulder surgery con 12/25/20 resulting in to change to shoulder condition. Pt was cleared to discontinue use of sling on 8/17 and was referred for an OT evaluation and treatment by Harriette Bouillon, PA-C and has a follow up apt with MD scheduled for 05/08/22.   PRECAUTIONS: Shoulder  WEIGHT BEARING RESTRICTIONS Yes NWB  PAIN:  Are you  having pain? Yes: NPRS scale: 4/10 Pain location: anterior and superior shoulder  Pain description: Stiff, achy Aggravating factors: Sleeping  Relieving factors: Ice   FALLS: Has patient fallen in last 6 months? No  LIVING ENVIRONMENT: Lives with: lives with their son and lives with their daughter Lives in: House/apartment   PLOF: Needs assistance with ADLs and Needs assistance with homemaking  PATIENT GOALS "To get this arm back to normal."  OBJECTIVE:   HAND DOMINANCE: Right  ADLs: Overall ADLs: Pt requires assistance with dressing, bathing, hygiene/grooming, and meal prep due to surgical  precautions.  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  Pt reports difficulty sleeping, as she prefers to sleep on her left side.    FUNCTIONAL OUTCOME MEASURES: FOTO: 49.51  UPPER EXTREMITY ROM     Passive ROM Left eval  Shoulder flexion 98  Shoulder abduction 94  Shoulder external rotation 40  (Blank rows = not tested)     Active ROM Left eval  Shoulder flexion   Shoulder abduction   Shoulder external rotation   (Blank rows = not tested)   UPPER EXTREMITY MMT:     MMT Left eval  Shoulder flexion   Shoulder abduction   Shoulder internal rotation   Shoulder external rotation   (Blank rows = not tested)  HAND FUNCTION: Grip strength: Right: 59 lbs; Left: 16 lbs   COGNITION: Overall cognitive status: Within functional limits for tasks assessed   OBSERVATIONS: Muscle restrictions noted throughout upper trapezius, anterior shoulder, anterior deltoids, and axillary region. Some mild swelling at anterior shoulder.    TODAY'S TREATMENT:  Evaluate and establish HEP    PATIENT EDUCATION: Education details: Table slides Person educated: Patient Education method: Explanation, Demonstration, and Handouts Education comprehension: verbalized understanding and returned demonstration   HOME EXERCISE PROGRAM: Eval: Table slides  GOALS: Goals reviewed with patient? Yes  SHORT TERM GOALS: Target date:  05/31/22     Pt will be provided with and educated on HEP to improve mobility in LUE required for ADL completion.   Goal status: INITIAL  2.  Pt will decrease pain in LUE to 3/10 or less in order to sleep for 4+ consecutive hours without waking due to pain.   Goal status: INITIAL  3.  Pt will increase P/ROM in LUE to [facility] to improve ability to perform dressing tasks with minimal compensatory techniques.   Goal status: INITIAL  4.  Pt will increase strength in LUE to 3/5 to  improve ability to perform lifting tasks required for meal preparation at waist height  Goal status: INITIAL   LONG TERM GOALS: Target date:  06/28/22     Pt will decrease pain in LUE to 1/10 or less in order to return to work tasks.  Goal status: INITIAL  2.  Pt will decrease LUE fascial restrictions to minimal amounts or less to improve mobility required for overhead reaching to retrieve clothing items from closet.   Goal status: INITIAL  3.  Pt will increase A/ROM of LUE to [facility] to improve ability to reach overhead and behind back during dressing and bathing tasks.   Goal status: INITIAL  4.  Pt will increase strength in LUE to 4+/5 to improve ability to perform lifting tasks required for work.   Goal status: INITIAL   ASSESSMENT:  CLINICAL IMPRESSION: Patient is a 51 y.o. female who was seen today for an occupational therapy evaluation s/p left rotator  cuff repair. She reports increased pain, and presents with decreased ROM and strength impacting her ability to complete ADLs independently. In addition, she also presents with fascial restrictions throughout the LUE. Pt was provided with an HEP, able to demonstrate with proper form and with no significant pain increase.   PERFORMANCE DEFICITS in functional skills including ADLs, IADLs, ROM, strength, pain, fascial restrictions, muscle spasms, endurance, and UE functional use.  IMPAIRMENTS are limiting patient from ADLs, IADLs, rest and sleep, work, leisure, and social participation.   COMORBIDITIES has no other co-morbidities that affects occupational performance. Patient will benefit from skilled OT to address above impairments and improve overall function.  MODIFICATION OR ASSISTANCE TO COMPLETE EVALUATION: No modification of tasks or assist necessary to complete an evaluation.  OT OCCUPATIONAL PROFILE AND HISTORY: Problem focused assessment: Including review of records relating to presenting problem.  CLINICAL DECISION  MAKING: LOW - limited treatment options, no task modification necessary  REHAB POTENTIAL: Good  EVALUATION COMPLEXITY: Low      PLAN: OT FREQUENCY: 2x/week  OT DURATION: 8 weeks  PLANNED INTERVENTIONS: self care/ADL training, therapeutic exercise, therapeutic activity, manual therapy, passive range of motion, moist heat, cryotherapy, patient/family education, energy conservation, coping strategies training, and DME and/or AE instructions   CONSULTED AND AGREED WITH PLAN OF CARE: Patient  PLAN FOR NEXT SESSION: Manual, P/ROM, scapular ROM    Perley Jain, OTD, OTR/L [phone]  05/01/2022, 11:06 AM

## 2022-05-01 NOTE — Patient Instructions (Signed)

## 2022-05-03 ENCOUNTER — Encounter (HOSPITAL_COMMUNITY): Payer: 59 | Admitting: Occupational Therapy

## 2022-05-06 ENCOUNTER — Encounter (HOSPITAL_COMMUNITY): Payer: Self-pay | Admitting: Hematology

## 2022-05-06 ENCOUNTER — Ambulatory Visit: Payer: 59 | Admitting: Gastroenterology

## 2022-05-07 ENCOUNTER — Ambulatory Visit (HOSPITAL_COMMUNITY): Payer: BC Managed Care – PPO | Admitting: Occupational Therapy

## 2022-05-07 DIAGNOSIS — M25512 Pain in left shoulder: Secondary | ICD-10-CM | POA: Diagnosis not present

## 2022-05-07 DIAGNOSIS — R29898 Other symptoms and signs involving the musculoskeletal system: Secondary | ICD-10-CM

## 2022-05-07 NOTE — Therapy (Signed)
OUTPATIENT OCCUPATIONAL THERAPY ORTHO TREATMENT  Patient Name: Suzanne Lawrence MRN: [id number] DOB:[birth year], 51 y.o., female Today's Date: 05/07/2022  PCP: [facility]  REFERRING PROVIDER: Harriette Bouillon, PA-C   OT End of Session - 05/07/22 1603     Visit Number 2    Number of Visits 16    Date for OT Re-Evaluation 06/28/22   Mini reassessment 10/4   Authorization Type BCBS    OT Start Time 1521    OT Stop Time 1559    OT Time Calculation (min) 38 min    Activity Tolerance Patient tolerated treatment well    Behavior During Therapy WFL for tasks assessed/performed              Past Medical History:  Diagnosis Date   Abnormal Pap smear of cervix    Anemia    issue with red blood cells being small   Anxiety    Arthritis    Carpal tunnel syndrome    right   Depression    DVT (deep venous thrombosis) (HCC) 03/2021   left arm   GERD (gastroesophageal reflux disease)    High blood pressure    History of hiatal hernia    Left breast mass    Pre-diabetes    Past Surgical History:  Procedure Laterality Date   BIOPSY  02/05/2022   Procedure: BIOPSY;  Surgeon: Lanelle Bal, DO;  [facility];  Service: Endoscopy;;   COLONOSCOPY  2022   Megan Straughan: 7 polyps removed, has to return in one year   COLONOSCOPY  09/2021   Megan Straughan: 12 polyps removed   ESOPHAGOGASTRODUODENOSCOPY (EGD) WITH PROPOFOL N/A 02/05/2022   Procedure: ESOPHAGOGASTRODUODENOSCOPY (EGD) WITH PROPOFOL;  Surgeon: Lanelle Bal, DO;  [facility];  Service: Endoscopy;  Laterality: N/A;  2:00pm   left shoulder arthroscopy  12/2020   SHOULDER ARTHROSCOPY WITH OPEN ROTATOR CUFF REPAIR AND DISTAL CLAVICLE ACROMINECTOMY Left 03/21/2022   Procedure: LEFT SHOULDER ARTHOSCOPY, DEBRIDEMENT,  MANIPULATION, ROTATOR CUFF REPAIR, ARTHROSCOPIC DISTAL CLAVICLE EXCISION;  Surgeon: Cammy Copa, MD;  Location: [facility];  Service: Orthopedics;  Laterality: Left;    TUBAL LIGATION     Patient Active Problem List   Diagnosis Date Noted   Arthritis of left acromioclavicular joint    Complete tear of left rotator cuff    Abdominal pain, epigastric 01/23/2022   Iron deficiency anemia 12/13/2021   Iron deficiency anemia due to chronic blood loss 12/13/2021   GERD (gastroesophageal reflux disease)    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 She reports having a previous shoulder surgery con 12/25/20 resulting in to change to shoulder condition. Pt was cleared to discontinue use of sling on 8/17 and was referred for an OT evaluation and treatment by Harriette Bouillon, PA-C and has a follow up apt with MD scheduled for 05/08/22.   PRECAUTIONS: Shoulder  WEIGHT BEARING RESTRICTIONS Yes NWB  PAIN:  Are you having pain? Yes: NPRS scale: 4/10  Pain location: anterior and superior shoulder  Pain description: Stiff, achy Aggravating factors: Sleeping  Relieving factors: Ice    OBJECTIVE:   ADLs: Overall ADLs: Pt requires assistance with dressing, bathing, hygiene/grooming, and meal prep due to surgical precautions.  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  Pt reports difficulty sleeping, as she prefers to sleep on her left side.    FUNCTIONAL OUTCOME MEASURES: FOTO: 49.51  UPPER EXTREMITY ROM     Passive ROM Left eval Left 05/07/22  Shoulder flexion 98  109  Shoulder abduction 94 96  Shoulder external rotation 40 45  (Blank rows = not tested)     Active ROM Left eval  Shoulder flexion   Shoulder abduction   Shoulder external rotation   (Blank rows = not tested)   UPPER EXTREMITY MMT:     MMT Left eval  Shoulder flexion   Shoulder abduction   Shoulder internal rotation   Shoulder external rotation   (Blank rows = not tested)  HAND FUNCTION: Grip strength: Right: 59 lbs; Left: 16 lbs    TODAY'S TREATMENT:  05/07/22 -Manual therapy: myofascial release to left upper arm, anterior shoulder, trapezius, and scapular regions to decrease pain and fascial restrictions and increase joint ROM -P/ROM: supine, flexion, abduction, er/IR, horizontal abduction, 5 reps each -AA/ROM: supine, protraction, flexion, er/IR, horizontal abduction, 10 reps each -Scapular A/ROM: seated, row, extension, 10 reps each -Pulleys: 1' flexion 1' abduction  PATIENT EDUCATION: Education details: Table slides Person educated: Patient Education method: Explanation, Demonstration, and Handouts Education comprehension: verbalized understanding and returned demonstration   HOME EXERCISE PROGRAM: Eval: Table slides  GOALS: Goals reviewed with patient? Yes  SHORT TERM GOALS: Target date:  05/31/22     Pt will be provided with and educated on HEP to improve mobility in LUE required for ADL completion.   Goal status: IN PROGRESS  2.  Pt will decrease pain in LUE to 3/10 or less in order to sleep for 4+ consecutive hours without waking due to pain.   Goal status: IN PROGRESS  3.  Pt will increase P/ROM in LUE to [facility] to improve ability to perform dressing tasks with minimal compensatory techniques.   Goal status: IN PROGRESS  4.  Pt will increase strength in LUE to 3/5 to improve ability to perform lifting tasks required for meal preparation at waist height  Goal status: IN PROGRESS   LONG TERM GOALS: Target date:  06/28/22     Pt will decrease  pain in LUE to 1/10 or less in order to return to work tasks.  Goal status: IN PROGRESS  2.  Pt will decrease LUE fascial restrictions to minimal amounts or less to improve mobility required for overhead reaching to retrieve clothing items from closet.   Goal status: IN PROGRESS  3.  Pt will increase A/ROM of LUE to [facility] to improve ability to reach overhead and behind back during dressing and bathing tasks.   Goal status: IN PROGRESS  4.  Pt will increase strength in LUE to 4+/5 to improve ability to perform lifting tasks required for work.   Goal status: IN PROGRESS   ASSESSMENT:  CLINICAL IMPRESSION: Pt reports she is using her LUE for HEP but nothing else, does not wear a sling any longer. Initiated myofascial release with max restrictions noted throughout LUE and scapular regions. Completed P/ROM and initiated AA/ROM in supine, pt able to gain more ROM with  tasks. At end of session during pulleys pt able to achieve approximately 65% flexion and abduction. Verbal cuing for form and technique, intermittent tactile positioning required.    PLAN: OT FREQUENCY: 2x/week  OT DURATION: 8 weeks  PLANNED INTERVENTIONS: self care/ADL training, therapeutic exercise, therapeutic activity, manual therapy, passive range of motion, moist heat, cryotherapy, patient/family education, energy conservation, coping strategies training, and DME and/or AE instructions   CONSULTED AND AGREED WITH PLAN OF CARE: Patient  PLAN FOR NEXT SESSION: Manual, P/ROM, scapular ROM    Ezra Sites, OTR/L  [phone] 05/07/2022, 4:04 PM

## 2022-05-08 ENCOUNTER — Ambulatory Visit (INDEPENDENT_AMBULATORY_CARE_PROVIDER_SITE_OTHER): Payer: BC Managed Care – PPO | Admitting: Orthopedic Surgery

## 2022-05-08 ENCOUNTER — Telehealth: Payer: Self-pay | Admitting: Orthopedic Surgery

## 2022-05-08 DIAGNOSIS — Z9889 Other specified postprocedural states: Secondary | ICD-10-CM

## 2022-05-08 NOTE — Telephone Encounter (Signed)
Patient need medical record sent to [employer] medical release forms already filled out copy in black tray

## 2022-05-10 ENCOUNTER — Encounter (HOSPITAL_COMMUNITY): Payer: Self-pay | Admitting: Occupational Therapy

## 2022-05-10 ENCOUNTER — Ambulatory Visit (HOSPITAL_COMMUNITY): Payer: BC Managed Care – PPO | Admitting: Occupational Therapy

## 2022-05-10 DIAGNOSIS — M25512 Pain in left shoulder: Secondary | ICD-10-CM

## 2022-05-10 DIAGNOSIS — R29898 Other symptoms and signs involving the musculoskeletal system: Secondary | ICD-10-CM

## 2022-05-10 NOTE — Therapy (Signed)
OUTPATIENT OCCUPATIONAL THERAPY ORTHO TREATMENT  Patient Name: Suzanne Lawrence MRN: [id number] DOB:March 31, 1971, 51 y.o., female Today's Date: 05/10/2022  PCP: [facility]  REFERRING PROVIDER: Harriette Bouillon, PA-C   OT End of Session - 05/10/22 1429     Visit Number 3    Number of Visits 16    Date for OT Re-Evaluation 06/28/22    Authorization Type BCBS    OT Start Time 1350    OT Stop Time 1428    OT Time Calculation (min) 38 min    Activity Tolerance Patient tolerated treatment well    Behavior During Therapy WFL for tasks assessed/performed               Past Medical History:  Diagnosis Date   Abnormal Pap smear of cervix    Anemia    issue with red blood cells being small   Anxiety    Arthritis    Carpal tunnel syndrome    right   Depression    DVT (deep venous thrombosis) (HCC) 03/2021   left arm   GERD (gastroesophageal reflux disease)    High blood pressure    History of hiatal hernia    Left breast mass    Pre-diabetes    Past Surgical History:  Procedure Laterality Date   BIOPSY  02/05/2022   Procedure: BIOPSY;  Surgeon: Lanelle Bal, DO;  Location: [facility];  Service: Endoscopy;;   COLONOSCOPY  2022   Megan Straughan: 7 polyps removed, has to return in one year   COLONOSCOPY  09/2021   Megan Straughan: 12 polyps removed   ESOPHAGOGASTRODUODENOSCOPY (EGD) WITH PROPOFOL N/A 02/05/2022   Procedure: ESOPHAGOGASTRODUODENOSCOPY (EGD) WITH PROPOFOL;  Surgeon: Lanelle Bal, DO;  Location: [facility];  Service: Endoscopy;  Laterality: N/A;  2:00pm   left shoulder arthroscopy  12/2020   SHOULDER ARTHROSCOPY WITH OPEN ROTATOR CUFF REPAIR AND DISTAL CLAVICLE ACROMINECTOMY Left 03/21/2022   Procedure: LEFT SHOULDER ARTHOSCOPY, DEBRIDEMENT,  MANIPULATION, ROTATOR CUFF REPAIR, ARTHROSCOPIC DISTAL CLAVICLE EXCISION;  Surgeon: Cammy Copa, MD;  Location: [facility];  Service: Orthopedics;  Laterality: Left;   TUBAL LIGATION      Patient Active Problem List   Diagnosis Date Noted   Arthritis of left acromioclavicular joint    Complete tear of left rotator cuff    Abdominal pain, epigastric 01/23/2022   Iron deficiency anemia 12/13/2021   Iron deficiency anemia due to chronic blood loss 12/13/2021   GERD (gastroesophageal reflux disease)    Cubital tunnel syndrome of both upper extremities 01/25/2020   High blood pressure    Depression    Carpal tunnel syndrome    Anxiety     ONSET DATE: 03/21/22  REFERRING DIAG: S97.026 (ICD-10-CM) - Status post rotator cuff repair  THERAPY DIAG:  Acute pain of left shoulder  Other symptoms and signs involving the musculoskeletal system  Rationale for Evaluation and Treatment Rehabilitation  SUBJECTIVE:   SUBJECTIVE STATEMENT: S: "It's just normal soreness."   PERTINENT HISTORY: Pt presents 6 weeks s/p left rotator cuff repair complete 03/21/22. She reports having a previous shoulder surgery con 12/25/20 resulting in to change to shoulder condition. Pt was cleared to discontinue use of sling on 8/17 and was referred for an OT evaluation and treatment by Harriette Bouillon, PA-C and has a follow up apt with MD scheduled for 05/08/22.   PRECAUTIONS: Shoulder  WEIGHT BEARING RESTRICTIONS Yes NWB  PAIN:  Are you having pain? Yes: NPRS scale: 4/10 Pain location: anterior  and superior shoulder  Pain description: Stiff, achy Aggravating factors: Sleeping  Relieving factors: Ice    OBJECTIVE:   ADLs: Overall ADLs: Pt requires assistance with dressing, bathing, hygiene/grooming, and meal prep due to surgical precautions.  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  Pt reports difficulty sleeping, as she prefers to sleep on her left side.    FUNCTIONAL OUTCOME MEASURES: FOTO: 49.51  UPPER EXTREMITY ROM     Passive ROM Left eval Left 05/07/22  Shoulder flexion 98 109  Shoulder  abduction 94 96  Shoulder external rotation 40 45  (Blank rows = not tested)     Active ROM Left eval  Shoulder flexion   Shoulder abduction   Shoulder external rotation   (Blank rows = not tested)   UPPER EXTREMITY MMT:     MMT Left eval  Shoulder flexion   Shoulder abduction   Shoulder internal rotation   Shoulder external rotation   (Blank rows = not tested)  HAND FUNCTION: Grip strength: Right: 59 lbs; Left: 16 lbs    TODAY'S TREATMENT:  05/07/22 -Manual therapy: myofascial release to left upper arm, anterior shoulder, trapezius, and scapular regions to decrease pain and fascial restrictions and increase joint ROM -P/ROM: supine, flexion, abduction, er/IR, horizontal abduction, 5 reps each -AA/ROM: supine, protraction, flexion, er/IR, horizontal abduction, 10 reps each -Scapular A/ROM: seated, row, extension, 10 reps each -Pulleys: 1' flexion 1' abduction  05/10/2022 -Manual therapy: myofascial release to left upper arm, anterior shoulder, trapezius, and scapular regions to decrease pain and fascial restrictions and increase joint ROM -P/ROM: supine, flexion, abduction, er/IR, horizontal abduction, 5 reps each (noted increased pain with abduction/horizontal abduction) - AA/ROM: supine, protraction, flexion, er/IR, horizontal abduction, 10 reps each - Wall stretch: in flexions and abduction (3 sets holding for 15 seconds each)   PATIENT EDUCATION: Education details: Table slides Person educated: Patient Education method: Explanation, Demonstration, and Handouts Education comprehension: verbalized understanding and returned demonstration   HOME EXERCISE PROGRAM: Eval: Table slides  GOALS: Goals reviewed with patient? Yes  SHORT TERM GOALS: Target date:  05/31/22     Pt will be provided with and educated on HEP to improve mobility in LUE required for ADL completion.   Goal status: IN PROGRESS  2.  Pt will decrease pain in LUE to 3/10 or less in order to  sleep for 4+ consecutive hours without waking due to pain.   Goal status: IN PROGRESS  3.  Pt will increase P/ROM in LUE to [facility] to improve ability to perform dressing tasks with minimal compensatory techniques.   Goal status: IN PROGRESS  4.  Pt will increase strength in LUE to 3/5 to improve ability to perform lifting tasks required for meal preparation at waist height  Goal status: IN PROGRESS   LONG TERM GOALS: Target date:  06/28/22     Pt will decrease pain in LUE to 1/10 or less in order to return to work tasks.  Goal status: IN PROGRESS  2.  Pt will decrease LUE fascial restrictions to minimal amounts or less to improve mobility required for overhead reaching to retrieve clothing items from closet.   Goal status: IN PROGRESS  3.  Pt will increase A/ROM of LUE to [facility] to improve ability to reach overhead and behind back during dressing and bathing tasks.   Goal status: IN PROGRESS  4.  Pt will increase strength in LUE to 4+/5 to improve  ability to perform lifting tasks required for work.   Goal status: IN PROGRESS   ASSESSMENT:  CLINICAL IMPRESSION: Pt reports she has been having a consistent amount of lower levels of pain (3/10) and sleeping around 4 hours at night before waking up, still having trouble getting comfortable and falling asleep. Noted increased fascial restrictions to anterior shoulder, completed fascial release to address restrictions and increase ROM. Pt stated that she has been using first in last out technique for dressing and receiving assist from her daughters as need for ADLs. Pt reports that shoulder abduction and horizontal abduction cause the most pain. VC and tactile cueing throughout session as needed for technique and posture.    PLAN: OT FREQUENCY: 2x/week  OT DURATION: 8 weeks  PLANNED INTERVENTIONS: self care/ADL training, therapeutic exercise, therapeutic activity, manual therapy, passive range of motion, moist heat, cryotherapy,  patient/family education, energy conservation, coping strategies training, and DME and/or AE instructions   CONSULTED AND AGREED WITH PLAN OF CARE: Patient  PLAN FOR NEXT SESSION: Manual, P/ROM, scapular ROM    Lurena Joiner, OTR/L  [phone] 05/10/2022, 2:30 PM

## 2022-05-13 ENCOUNTER — Encounter (HOSPITAL_COMMUNITY): Payer: Self-pay | Admitting: Occupational Therapy

## 2022-05-13 ENCOUNTER — Ambulatory Visit (HOSPITAL_COMMUNITY): Payer: BC Managed Care – PPO | Admitting: Occupational Therapy

## 2022-05-13 DIAGNOSIS — M25512 Pain in left shoulder: Secondary | ICD-10-CM

## 2022-05-13 DIAGNOSIS — R29898 Other symptoms and signs involving the musculoskeletal system: Secondary | ICD-10-CM

## 2022-05-13 NOTE — Therapy (Signed)
OUTPATIENT OCCUPATIONAL THERAPY ORTHO TREATMENT  Patient Name: Suzanne Lawrence MRN: [id number] DOB:Feb 04, 1971, 51 y.o., female Today's Date: 05/13/2022  PCP: [place] PROVIDER: Gloriann Loan, PA-C   OT End of Session - 05/13/22 1426     Visit Number 4    Number of Visits 16    Date for OT Re-Evaluation 06/28/22    Authorization Type BCBS    OT Start Time 1345    OT Stop Time 1426    OT Time Calculation (min) 41 min    Activity Tolerance Patient tolerated treatment well    Behavior During Therapy WFL for tasks assessed/performed                Past Medical History:  Diagnosis Date   Abnormal Pap smear of cervix    Anemia    issue with red blood cells being small   Anxiety    Arthritis    Carpal tunnel syndrome    right   Depression    DVT (deep venous thrombosis) ([place]) 03/2021   left arm   GERD (gastroesophageal reflux disease)    High blood pressure    History of hiatal hernia    Left breast mass    Pre-diabetes    Past Surgical History:  Procedure Laterality Date   BIOPSY  02/05/2022   Procedure: BIOPSY;  Surgeon: Eloise Harman, DO;  [facility]: [facility];  Service: Endoscopy;;   COLONOSCOPY  2022   Megan Straughan: 7 polyps removed, has to return in one year   COLONOSCOPY  09/2021   Megan Straughan: 12 polyps removed   ESOPHAGOGASTRODUODENOSCOPY (EGD) WITH PROPOFOL N/A 02/05/2022   Procedure: ESOPHAGOGASTRODUODENOSCOPY (EGD) WITH PROPOFOL;  Surgeon: Eloise Harman, DO;  Location: [facility];  Service: Endoscopy;  Laterality: N/A;  2:00pm   left shoulder arthroscopy  12/2020   SHOULDER ARTHROSCOPY WITH OPEN ROTATOR CUFF REPAIR AND DISTAL CLAVICLE ACROMINECTOMY Left 03/21/2022   Procedure: LEFT SHOULDER ARTHOSCOPY, DEBRIDEMENT,  MANIPULATION, ROTATOR CUFF REPAIR, ARTHROSCOPIC DISTAL CLAVICLE EXCISION;  Surgeon: Meredith Pel, MD;  Location: [place];  Service: Orthopedics;  Laterality: Left;   TUBAL LIGATION      Patient Active Problem List   Diagnosis Date Noted   Arthritis of left acromioclavicular joint    Complete tear of left rotator cuff    Abdominal pain, epigastric 01/23/2022   Iron deficiency anemia 12/13/2021   Iron deficiency anemia due to chronic blood loss 12/13/2021   GERD (gastroesophageal reflux disease)    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 She reports having a previous shoulder surgery con 12/25/20 resulting in to change to shoulder condition. Pt was cleared to discontinue use of sling on 8/17 and was referred for an OT evaluation and treatment by Gloriann Loan, PA-C and has a follow up apt with MD scheduled for 05/08/22.   PRECAUTIONS: Shoulder  WEIGHT BEARING RESTRICTIONS Yes NWB  PAIN:  Are you having pain? Yes: NPRS scale:  4/10 Pain location: anterior and superior shoulder  Pain description: Stiff, achy Aggravating factors: Sleeping  Relieving factors: Ice    OBJECTIVE:   ADLs: Overall ADLs: Pt requires assistance with dressing, bathing, hygiene/grooming, and meal prep due to surgical precautions.  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  Pt reports difficulty sleeping, as she prefers to sleep on her left side.    FUNCTIONAL OUTCOME MEASURES: FOTO: 49.51  UPPER EXTREMITY ROM     Assessed supine, er/IR adducted  Passive ROM Left eval  Left 05/07/22  Shoulder flexion 98 109  Shoulder abduction 94 96  Shoulder external rotation 40 45  (Blank rows = not tested)     Active ROM Left eval  Shoulder flexion   Shoulder abduction   Shoulder external rotation   (Blank rows = not tested)   UPPER EXTREMITY MMT:     MMT Left eval  Shoulder flexion   Shoulder abduction   Shoulder internal rotation   Shoulder external rotation   (Blank rows = not tested)  HAND FUNCTION: Grip strength: Right: 59 lbs; Left: 16 lbs    TODAY'S TREATMENT:   05/13/22 -Manual therapy: myofascial release to left upper arm, anterior shoulder, trapezius, and scapular regions to decrease pain and fascial restrictions and increase joint ROM -P/ROM: supine, flexion, abduction, er/IR, horizontal abduction, 5 reps each -AA/ROM: supine, protraction, flexion, er/IR, horizontal abduction, abduction 10 reps each -AA/ROM: standing, protraction, flexion, er/IR, horizontal abduction, abduction 10 reps each   05/07/22 -Manual therapy: myofascial release to left upper arm, anterior shoulder, trapezius, and scapular regions to decrease pain and fascial restrictions and increase joint ROM -P/ROM: supine, flexion, abduction, er/IR, horizontal abduction, 5 reps each -AA/ROM: supine, protraction, flexion, er/IR, horizontal abduction, 10 reps each -Scapular A/ROM: seated, row, extension, 10 reps each -Pulleys: 1' flexion 1' abduction  05/10/2022 -Manual therapy: myofascial release to left upper arm, anterior shoulder, trapezius, and scapular regions to decrease pain and fascial restrictions and increase joint ROM -P/ROM: supine, flexion, abduction, er/IR, horizontal abduction, 5 reps each (noted increased pain with abduction/horizontal abduction) - AA/ROM: supine, protraction, flexion, er/IR, horizontal abduction, 10 reps each - Wall stretch: in flexions and abduction (3 sets holding for 15 seconds each)      PATIENT EDUCATION: Education details:  Person  educated: Patient Education method: Consulting civil engineer, Media planner, and Handouts Education comprehension: verbalized understanding and returned demonstration   HOME EXERCISE PROGRAM: Eval: Table slides  GOALS: Goals reviewed with patient? Yes  SHORT TERM GOALS: Target date:  05/31/22     Pt will be provided with and educated on HEP to improve mobility in LUE required for ADL completion.   Goal status: IN PROGRESS  2.  Pt will decrease pain in LUE to 3/10 or less in order to sleep for 4+ consecutive hours without waking due to pain.   Goal status: IN PROGRESS  3.  Pt will increase P/ROM in LUE to [facility] to improve ability to perform dressing tasks with minimal compensatory techniques.   Goal status: IN PROGRESS  4.  Pt will increase strength in LUE to 3/5 to improve ability to perform lifting tasks required for meal preparation at waist height  Goal status: IN PROGRESS   LONG TERM GOALS: Target date:  06/28/22     Pt will decrease pain in LUE to 1/10 or less in order to return to work tasks.  Goal status: IN PROGRESS  2.  Pt will decrease  LUE fascial restrictions to minimal amounts or less to improve mobility required for overhead reaching to retrieve clothing items from closet.   Goal status: IN PROGRESS  3.  Pt will increase A/ROM of LUE to [facility] to improve ability to reach overhead and behind back during dressing and bathing tasks.   Goal status: IN PROGRESS  4.  Pt will increase strength in LUE to 4+/5 to improve ability to perform lifting tasks required for work.   Goal status: IN PROGRESS   ASSESSMENT:  CLINICAL IMPRESSION: Pt reports she has been having a popping sensation, no pain but is annoying. Continued with myofascial release to left shoulder region, trigger point release at upper trapezius. Pt able to tolerate approximately 75% ROM during passive stretching. Continued with AA/ROM, adding standing exercises, verbal cuing for form and technique. Rest breaks  provided as needed after exercises, pt reports mild to moderate soreness during exercises. Pt with mod difficulty when completing abduction in supine, greater ease in standing.    PLAN: OT FREQUENCY: 2x/week  OT DURATION: 8 weeks  PLANNED INTERVENTIONS: self care/ADL training, therapeutic exercise, therapeutic activity, manual therapy, passive range of motion, moist heat, cryotherapy, patient/family education, energy conservation, coping strategies training, and DME and/or AE instructions   CONSULTED AND AGREED WITH PLAN OF CARE: Patient  PLAN FOR NEXT SESSION: Manual, P/ROM, AA/ROM progressing to standing and add to HEP; discontinue abduction in supine, add wall wash     Guadelupe Sabin, OTR/L  [phone] 05/13/2022, 2:26 PM

## 2022-05-14 ENCOUNTER — Encounter (HOSPITAL_COMMUNITY): Payer: Self-pay | Admitting: Occupational Therapy

## 2022-05-14 ENCOUNTER — Ambulatory Visit (HOSPITAL_COMMUNITY): Payer: BC Managed Care – PPO | Admitting: Occupational Therapy

## 2022-05-14 ENCOUNTER — Encounter: Payer: Self-pay | Admitting: Orthopedic Surgery

## 2022-05-14 DIAGNOSIS — R29898 Other symptoms and signs involving the musculoskeletal system: Secondary | ICD-10-CM

## 2022-05-14 DIAGNOSIS — M25512 Pain in left shoulder: Secondary | ICD-10-CM | POA: Diagnosis not present

## 2022-05-14 NOTE — Patient Instructions (Addendum)
Perform each exercise ____10-15____ reps. 2-3x days.   1) Protraction   Start by holding a wand or cane at chest height.  Next, slowly push the wand outwards in front of your body so that your elbows become fully straightened. Then, return to the original position.     2) Shoulder FLEXION   In the standing position, hold a wand/cane with both arms, palms down on both sides. Raise up the wand/cane allowing your unaffected arm to perform most of the effort. Your affected arm should be partially relaxed.      3) Internal/External ROTATION   In the standing position, hold a wand/cane with both hands keeping your elbows bent. Move your arms and wand/cane to one side.  Your affected arm should be partially relaxed while your unaffected arm performs most of the effort.       4) Shoulder ABDUCTION   While holding a wand/cane palm face up on the injured side and palm face down on the uninjured side, slowly raise up your injured arm to the side.        5) Horizontal Abduction/Adduction      Straight arms holding cane at shoulder height, bring cane to right, center, left. Repeat starting to left.   Copyright  [employer]. All rights reserved.      Complete the following 2 a day. Hold for 15 seconds. Complete 2-3 sets for each.   1) SHOULDER - ISOMETRIC FLEXION  Gently push your fist forward into a wall with your elbow bent.    2) SHOULDER - ISOMETRIC EXTENSION  Gently push your a bent elbow back into a wall.    3) SHOULDER - ISOMETRIC INTERNAL ROTATION   Gently press your hand into a wall using the palm side of your hand.  Maintain a bent elbow the entire time.        4) SHOULDER - ISOMETRIC ADDUCTION  Gently push your elbow into the side of your body.   5) SHOULDER - ISOMETRIC ABDUCTION  Gently push your elbow out to the side into a wall with your elbow bent.

## 2022-05-14 NOTE — Therapy (Signed)
OUTPATIENT OCCUPATIONAL THERAPY ORTHO TREATMENT  Patient Name: Suzanne Lawrence MRN: [id number] DOB:[birth year], 51 y.o., female Today's Date: 05/14/2022  PCP: [place] PROVIDER: Gloriann Loan, PA-C   OT End of Session - 05/14/22 1700     Visit Number 5    Number of Visits 16    Date for OT Re-Evaluation 06/28/22    Authorization Type BCBS    OT Start Time 1345    OT Stop Time 1428    OT Time Calculation (min) 43 min    Activity Tolerance Patient tolerated treatment well    Behavior During Therapy WFL for tasks assessed/performed                 Past Medical History:  Diagnosis Date   Abnormal Pap smear of cervix    Anemia    issue with red blood cells being small   Anxiety    Arthritis    Carpal tunnel syndrome    right   Depression    DVT (deep venous thrombosis) ([place]) 03/2021   left arm   GERD (gastroesophageal reflux disease)    High blood pressure    History of hiatal hernia    Left breast mass    Pre-diabetes    Past Surgical History:  Procedure Laterality Date   BIOPSY  02/05/2022   Procedure: BIOPSY;  Surgeon: Eloise Harman, DO;  [facility];  Service: Endoscopy;;   COLONOSCOPY  2022   Megan Straughan: 7 polyps removed, has to return in one year   COLONOSCOPY  09/2021   Megan Straughan: 12 polyps removed   ESOPHAGOGASTRODUODENOSCOPY (EGD) WITH PROPOFOL N/A 02/05/2022   Procedure: ESOPHAGOGASTRODUODENOSCOPY (EGD) WITH PROPOFOL;  Surgeon: Eloise Harman, DO;  [facility];  Service: Endoscopy;  Laterality: N/A;  2:00pm   left shoulder arthroscopy  12/2020   SHOULDER ARTHROSCOPY WITH OPEN ROTATOR CUFF REPAIR AND DISTAL CLAVICLE ACROMINECTOMY Left 03/21/2022   Procedure: LEFT SHOULDER ARTHOSCOPY, DEBRIDEMENT,  MANIPULATION, ROTATOR CUFF REPAIR, ARTHROSCOPIC DISTAL CLAVICLE EXCISION;  Surgeon: Meredith Pel, MD;  Location: [facility];  Service: Orthopedics;  Laterality: Left;   TUBAL LIGATION      Patient Active Problem List   Diagnosis Date Noted   Arthritis of left acromioclavicular joint    Complete tear of left rotator cuff    Abdominal pain, epigastric 01/23/2022   Iron deficiency anemia 12/13/2021   Iron deficiency anemia due to chronic blood loss 12/13/2021   GERD (gastroesophageal reflux disease)    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 She reports having a previous shoulder surgery con 12/25/20 resulting in to change to shoulder condition. Pt was cleared to discontinue use of sling on 8/17 and was referred for an OT evaluation and treatment by Gloriann Loan, PA-C and has a follow up apt with MD scheduled for 05/08/22.   PRECAUTIONS: Shoulder  WEIGHT BEARING RESTRICTIONS Yes NWB  PAIN:  Are you having pain? Yes: NPRS scale: 4/10 Pain location: anterior and superior shoulder  Pain description: Stiff, achy Aggravating factors: Sleeping  Relieving factors: Ice    OBJECTIVE:   ADLs: Overall ADLs: Pt requires assistance with dressing, bathing, hygiene/grooming, and meal prep due to surgical precautions.  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  Pt reports difficulty sleeping, as she prefers to sleep on her left side.    FUNCTIONAL OUTCOME MEASURES: FOTO: 49.51  UPPER EXTREMITY ROM     Assessed supine, er/IR adducted  Passive  ROM Left eval Left 05/07/22  Shoulder flexion 98 109  Shoulder abduction 94 96  Shoulder external rotation 40 45  (Blank rows = not tested)     Active ROM Left eval  Shoulder flexion   Shoulder abduction   Shoulder external rotation   (Blank rows = not tested)   UPPER EXTREMITY MMT:     MMT Left eval  Shoulder flexion   Shoulder abduction   Shoulder internal rotation   Shoulder external rotation   (Blank rows = not tested)  HAND FUNCTION: Grip strength: Right: 59 lbs; Left: 16 lbs    TODAY'S TREATMENT:   05/14/22 -Manual therapy: myofascial release to left upper arm, anterior shoulder, trapezius, and scapular regions to decrease pain and fascial restrictions and increase joint ROM -P/ROM: supine, flexion, abduction, er/IR, horizontal abduction, 10 reps each -AA/ROM: supine, protraction, flexion, er/IR, horizontal abduction, 10 reps each -Isometrics: flexion, extension, abduction, adduction, er/IR, 1x15" -AA/ROM: standing, protraction, flexion, er/IR, horizontal abduction, abduction 8 reps each -Wall Washing: flexion and abduction 5x5 sec holds  05/13/22 -Manual therapy: myofascial release to left upper arm, anterior shoulder, trapezius, and scapular regions to decrease pain and fascial restrictions and increase joint ROM -P/ROM: supine, flexion, abduction, er/IR, horizontal abduction, 5 reps each -AA/ROM: supine, protraction, flexion, er/IR, horizontal abduction, abduction 10 reps each -AA/ROM: standing, protraction, flexion, er/IR, horizontal abduction, abduction 10 reps each  05/07/22 -Manual therapy: myofascial release to left upper arm, anterior shoulder, trapezius, and scapular regions to decrease pain and fascial restrictions and increase joint ROM -P/ROM: supine, flexion, abduction, er/IR, horizontal abduction, 5 reps each -AA/ROM: supine, protraction, flexion, er/IR, horizontal abduction, 10 reps each -Scapular A/ROM: seated, row, extension, 10 reps  each -Pulleys: 1' flexion 1' abduction   PATIENT EDUCATION: Education details: AA/ROM and Isometrics Person educated: Patient Education method: Explanation, Demonstration, and Handouts Education comprehension: verbalized understanding and returned demonstration   HOME EXERCISE PROGRAM: Eval: Table slides 9/19: AA/ROM and Isometrics  GOALS: Goals reviewed with patient? Yes  SHORT TERM GOALS: Target date:  05/31/22     Pt will be provided with and educated on HEP to improve mobility in LUE required for ADL completion.   Goal status: IN PROGRESS  2.  Pt will decrease pain in LUE to 3/10 or less in order to sleep for 4+ consecutive hours without waking due to pain.   Goal status: IN PROGRESS  3.  Pt will increase P/ROM in LUE to [facility] to improve ability to perform dressing tasks with minimal compensatory techniques.   Goal status: IN PROGRESS  4.  Pt will increase strength in LUE to 3/5 to improve ability to perform lifting tasks required for meal preparation at waist height  Goal status: IN PROGRESS   LONG TERM GOALS: Target date:  06/28/22     Pt will decrease pain in LUE to 1/10 or less in order to return  to work tasks.  Goal status: IN PROGRESS  2.  Pt will decrease LUE fascial restrictions to minimal amounts or less to improve mobility required for overhead reaching to retrieve clothing items from closet.   Goal status: IN PROGRESS  3.  Pt will increase A/ROM of LUE to [facility] to improve ability to reach overhead and behind back during dressing and bathing tasks.   Goal status: IN PROGRESS  4.  Pt will increase strength in LUE to 4+/5 to improve ability to perform lifting tasks required for work.   Goal status: IN PROGRESS   ASSESSMENT:  CLINICAL IMPRESSION: Pt reporting that she is very sore from manual therapy from her OT session yesterday. Pt unable to tolerate trigger point this session on fascial restrictions, therapist providing moderate intensity of  myofascial release around biceps, axillary region, and trapezius. This session pt continued to have difficulty with supine AA/ROM abduction due to pain, however was able to complete exercise in standing with approximately 90 degrees of abduction. Therapist providing mod tactile cuing for proper mechanics and assist with form/positioning. Added Isometrics this session to begin shoulder stability strengthening.  PLAN: OT FREQUENCY: 2x/week  OT DURATION: 8 weeks  PLANNED INTERVENTIONS: self care/ADL training, therapeutic exercise, therapeutic activity, manual therapy, passive range of motion, moist heat, cryotherapy, patient/family education, energy conservation, coping strategies training, and DME and/or AE instructions   CONSULTED AND AGREED WITH PLAN OF CARE: Patient  PLAN FOR NEXT SESSION: Manual, P/ROM, AA/ROM in standing and add to HEP; continue isometrics and wall walks     Paulita Fujita, OTR/L [phone] 05/14/2022, 5:03 PM

## 2022-05-14 NOTE — Progress Notes (Signed)
Post-Op Visit Note   Patient: Suzanne Lawrence           Date of Birth: Dec 26, 1970           MRN: [id number] Visit Date: 05/08/2022 PCP: [facility]   Assessment & Plan:  Chief Complaint:  Chief Complaint  Patient presents with   Left Shoulder - Follow-up   Visit Diagnoses: No diagnosis found.  Plan: Suzanne Lawrence is a 51 year old patient who underwent left shoulder rotator cuff tear repair and distal clavicle excision on 03/21/2022.  6 weeks out.  Doing stretching and physical therapy.  Also does a home exercise program.  Taking Aleve Tylenol and muscle relaxer.  Sleeping remains difficult for her.  On examination she has passive range of motion of 50/85/120.  No grinding or crepitus with internal/external rotation of the left shoulder.  Plan at this time is continue with therapy.  She has to give a lift about 50 pounds before she goes back to work.  I think in 6 weeks we will have a better idea about projecting when she could go back to work but for now I want her to start on her strengthening routine.  Follow-Up Instructions: No follow-ups on file.   Orders:  No orders of the defined types were placed in this encounter.  No orders of the defined types were placed in this encounter.   Imaging: No results found.  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 01/25/2020   High blood pressure    Depression    Carpal tunnel syndrome    Anxiety    Past Medical History:  Diagnosis Date   Abnormal Pap smear of cervix    Anemia    issue with red blood cells being small   Anxiety    Arthritis    Carpal tunnel syndrome    right    Depression    DVT (deep venous thrombosis) ([place]) 03/2021   left arm   GERD (gastroesophageal reflux disease)    High blood pressure    History of hiatal hernia    Left breast mass    Pre-diabetes     Family History  Problem Relation Age of Onset   Lung cancer Mother    Other Father        multiple issues, doesn't know history for sure   Prostate cancer Father    Stroke Father    Aneurysm Paternal Grandmother    Breast cancer Maternal Aunt    Prostate cancer Maternal Uncle    Alcoholism Maternal Uncle    Aneurysm Cousin        paternal side   Aneurysm Cousin        paternal side    Breast cancer Cousin        maternal side    High blood pressure Other        father's side    Cerebral aneurysm Other        cerebral, on father's side    Alcoholism Other        maternal side    Colon cancer Neg Hx     Past Surgical History:  Procedure  Laterality Date   BIOPSY  02/05/2022   Procedure: BIOPSY;  Surgeon: Lanelle Bal, DO;  [facility];  Service: Endoscopy;;   COLONOSCOPY  2022   Megan Straughan: 7 polyps removed, has to return in one year   COLONOSCOPY  09/2021   Arlyn Dunning: 12 polyps removed   ESOPHAGOGASTRODUODENOSCOPY (EGD) WITH PROPOFOL N/A 02/05/2022   Procedure: ESOPHAGOGASTRODUODENOSCOPY (EGD) WITH PROPOFOL;  Surgeon: Lanelle Bal, DO;  [facility];  Service: Endoscopy;  Laterality: N/A;  2:00pm   left shoulder arthroscopy  12/2020   SHOULDER ARTHROSCOPY WITH OPEN ROTATOR CUFF REPAIR AND DISTAL CLAVICLE ACROMINECTOMY Left 03/21/2022   Procedure: LEFT SHOULDER ARTHOSCOPY, DEBRIDEMENT,  MANIPULATION, ROTATOR CUFF REPAIR, ARTHROSCOPIC DISTAL CLAVICLE EXCISION;  Surgeon: Cammy Copa, MD;  [facility];  Service: Orthopedics;  Laterality: Left;   TUBAL LIGATION     Social History   Occupational History   Not on file  Tobacco Use   Smoking status: Every Day    Packs/day: 0.50    Types: Cigarettes   Smokeless tobacco:  Never  Vaping Use   Vaping Use: Never used  Substance and Sexual Activity   Alcohol use: Not Currently   Drug use: Not Currently   Sexual activity: Yes    Birth control/protection: None

## 2022-05-15 ENCOUNTER — Ambulatory Visit (HOSPITAL_COMMUNITY): Payer: BC Managed Care – PPO | Admitting: Occupational Therapy

## 2022-05-17 ENCOUNTER — Ambulatory Visit (HOSPITAL_COMMUNITY): Payer: BC Managed Care – PPO | Admitting: Occupational Therapy

## 2022-05-21 ENCOUNTER — Ambulatory Visit (HOSPITAL_COMMUNITY): Payer: BC Managed Care – PPO | Admitting: Occupational Therapy

## 2022-05-21 ENCOUNTER — Encounter (HOSPITAL_COMMUNITY): Payer: Self-pay | Admitting: Occupational Therapy

## 2022-05-21 DIAGNOSIS — R29898 Other symptoms and signs involving the musculoskeletal system: Secondary | ICD-10-CM

## 2022-05-21 DIAGNOSIS — M25512 Pain in left shoulder: Secondary | ICD-10-CM

## 2022-05-21 NOTE — Therapy (Signed)
OUTPATIENT OCCUPATIONAL THERAPY ORTHO TREATMENT  Patient Name: Suzanne Lawrence MRN: [id number] DOB:1970-09-02, 51 y.o., female Today's Date: 05/21/2022  PCP: [place] PROVIDER: Gloriann Loan, PA-C (Dr. Hortense Ramal)   OT End of Session - 05/21/22 1611     Visit Number 6    Number of Visits 16    Date for OT Re-Evaluation 06/28/22    Authorization Type BCBS    OT Start Time 1346    OT Stop Time 1429    OT Time Calculation (min) 43 min    Activity Tolerance Patient tolerated treatment well    Behavior During Therapy WFL for tasks assessed/performed                  Past Medical History:  Diagnosis Date   Abnormal Pap smear of cervix    Anemia    issue with red blood cells being small   Anxiety    Arthritis    Carpal tunnel syndrome    right   Depression    DVT (deep venous thrombosis) ([place]) 03/2021   left arm   GERD (gastroesophageal reflux disease)    High blood pressure    History of hiatal hernia    Left breast mass    Pre-diabetes    Past Surgical History:  Procedure Laterality Date   BIOPSY  02/05/2022   Procedure: BIOPSY;  Surgeon: Eloise Harman, DO;  [facility];  Service: Endoscopy;;   COLONOSCOPY  2022   Megan Straughan: 7 polyps removed, has to return in one year   COLONOSCOPY  09/2021   Megan Straughan: 12 polyps removed   ESOPHAGOGASTRODUODENOSCOPY (EGD) WITH PROPOFOL N/A 02/05/2022   Procedure: ESOPHAGOGASTRODUODENOSCOPY (EGD) WITH PROPOFOL;  Surgeon: Eloise Harman, DO;  [facility];  Service: Endoscopy;  Laterality: N/A;  2:00pm   left shoulder arthroscopy  12/2020   SHOULDER ARTHROSCOPY WITH OPEN ROTATOR CUFF REPAIR AND DISTAL CLAVICLE ACROMINECTOMY Left 03/21/2022   Procedure: LEFT SHOULDER ARTHOSCOPY, DEBRIDEMENT,  MANIPULATION, ROTATOR CUFF REPAIR, ARTHROSCOPIC DISTAL CLAVICLE EXCISION;  Surgeon: Meredith Pel, MD;  Location: [facility];  Service: Orthopedics;  Laterality: Left;    TUBAL LIGATION     Patient Active Problem List   Diagnosis Date Noted   Arthritis of left acromioclavicular joint    Complete tear of left rotator cuff    Abdominal pain, epigastric 01/23/2022   Iron deficiency anemia 12/13/2021   Iron deficiency anemia due to chronic blood loss 12/13/2021   GERD (gastroesophageal reflux disease)    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 She reports having a previous shoulder surgery con 12/25/20 resulting in to change to shoulder condition. Pt was cleared to discontinue use of sling on 8/17 and was referred for an OT evaluation and treatment by Gloriann Loan, PA-C.   PRECAUTIONS: Shoulder  WEIGHT BEARING RESTRICTIONS Yes NWB  PAIN:  Are you having pain? Yes: NPRS scale: 6/10 Pain location:  anterior and superior shoulder  Pain description: Stiff, achy, sore Aggravating factors: Sleeping  Relieving factors: Ice    OBJECTIVE:   ADLs: Overall ADLs: Pt requires assistance with dressing, bathing, hygiene/grooming, and meal prep due to surgical precautions.  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  Pt reports difficulty sleeping, as she prefers to sleep on her left side.    FUNCTIONAL OUTCOME MEASURES: FOTO: 49.51  UPPER EXTREMITY ROM     Assessed supine, er/IR adducted  Passive ROM Left eval Left 05/07/22   Shoulder flexion 98 109  Shoulder abduction 94 96  Shoulder external rotation 40 45  (Blank rows = not tested)     Active ROM Left eval  Shoulder flexion   Shoulder abduction   Shoulder external rotation   (Blank rows = not tested)   UPPER EXTREMITY MMT:     MMT Left eval  Shoulder flexion   Shoulder abduction   Shoulder internal rotation   Shoulder external rotation   (Blank rows = not tested)  HAND FUNCTION: Grip strength: Right: 59 lbs; Left: 16 lbs    TODAY'S TREATMENT:   05/21/22 -Manual therapy: myofascial release to left upper arm, anterior shoulder, trapezius, and scapular regions to decrease pain and fascial restrictions and increase joint ROM -P/ROM: supine, flexion, abduction, er/IR, horizontal abduction, 5 reps each -AA/ROM: supine, protraction, flexion, er/IR, horizontal abduction, 10 reps each -AA/ROM: seated, protraction, flexion, er/IR, 10 reps each -Wall wash, 1' low level -Pulleys: 1' flexion, 1' abduction  05/14/22 -Manual therapy: myofascial release to left upper arm, anterior shoulder, trapezius, and scapular regions to decrease pain and fascial restrictions and increase joint ROM -P/ROM: supine, flexion, abduction, er/IR, horizontal abduction, 10 reps each -AA/ROM: supine, protraction, flexion, er/IR, horizontal abduction, 10 reps each -Isometrics: flexion, extension, abduction, adduction, er/IR, 1x15" -AA/ROM: standing, protraction, flexion, er/IR, horizontal abduction, abduction 8 reps each -Wall Washing: flexion and abduction 5x5 sec holds  05/13/22 -Manual therapy: myofascial release to left upper arm, anterior shoulder, trapezius, and scapular regions to decrease pain and fascial restrictions and increase joint ROM -P/ROM: supine, flexion, abduction, er/IR, horizontal abduction, 5 reps each -AA/ROM: supine, protraction, flexion, er/IR, horizontal abduction, abduction 10 reps each -AA/ROM: standing, protraction, flexion, er/IR, horizontal  abduction, abduction 10 reps each    PATIENT EDUCATION: Education details:reviewed HEP Person educated: Patient Education method: Consulting civil engineer, Demonstration, and Handouts Education comprehension: verbalized understanding and returned demonstration   HOME EXERCISE PROGRAM: Eval: Table slides 9/19: AA/ROM and Isometrics  GOALS: Goals reviewed with patient? Yes  SHORT TERM GOALS: Target date:  05/31/22     Pt will be provided with and educated on HEP to improve mobility in LUE required for ADL completion.   Goal status: IN PROGRESS  2.  Pt will decrease pain in LUE to 3/10 or less in order to sleep for 4+ consecutive hours without waking due to pain.   Goal status: IN PROGRESS  3.  Pt will increase P/ROM in LUE to [facility] to improve ability to perform dressing tasks with minimal compensatory techniques.   Goal status: IN PROGRESS  4.  Pt will increase strength in LUE to 3/5 to improve ability to perform lifting tasks required for meal preparation at waist height  Goal status: IN PROGRESS   LONG TERM GOALS: Target date:  06/28/22     Pt will decrease pain in LUE to 1/10 or less in order to return to work tasks.  Goal  status: IN PROGRESS  2.  Pt will decrease LUE fascial restrictions to minimal amounts or less to improve mobility required for overhead reaching to retrieve clothing items from closet.   Goal status: IN PROGRESS  3.  Pt will increase A/ROM of LUE to [facility] to improve ability to reach overhead and behind back during dressing and bathing tasks.   Goal status: IN PROGRESS  4.  Pt will increase strength in LUE to 4+/5 to improve ability to perform lifting tasks required for work.   Goal status: IN PROGRESS   ASSESSMENT:  CLINICAL IMPRESSION: Pt reporting that she lifting a pot on Saturday and her arm has been off ever since. Also reports HEP is going well. Continued with myofascial release, continues to have mod to max fascial restrictions however less than when  this OT saw pt one week ago. Continued with AA/ROM working on form and technique, as well as reaching end stretch of available range. Increased time required for completion, pt resting after each exercise. Added protraction, er/IR, and flexion in sitting today, reports protraction is easier when sitting up and flexion is easier when lying down. Verbal cuing for form, technique, and speed.   PLAN: OT FREQUENCY: 2x/week  OT DURATION: 8 weeks  PLANNED INTERVENTIONS: self care/ADL training, therapeutic exercise, therapeutic activity, manual therapy, passive range of motion, moist heat, cryotherapy, patient/family education, energy conservation, coping strategies training, and DME and/or AE instructions   CONSULTED AND AGREED WITH PLAN OF CARE: Patient  PLAN FOR NEXT SESSION: Manual, P/ROM, AA/ROM in standing/sitting and add to [employer], OTR/L  [phone] 05/21/2022, 4:12 PM

## 2022-05-23 ENCOUNTER — Encounter (HOSPITAL_COMMUNITY): Payer: Self-pay | Admitting: Occupational Therapy

## 2022-05-23 ENCOUNTER — Ambulatory Visit (HOSPITAL_COMMUNITY): Payer: BC Managed Care – PPO | Admitting: Occupational Therapy

## 2022-05-23 DIAGNOSIS — R29898 Other symptoms and signs involving the musculoskeletal system: Secondary | ICD-10-CM

## 2022-05-23 DIAGNOSIS — M25512 Pain in left shoulder: Secondary | ICD-10-CM | POA: Diagnosis not present

## 2022-05-23 NOTE — Therapy (Signed)
OUTPATIENT OCCUPATIONAL THERAPY ORTHO TREATMENT  Patient Name: Suzanne Lawrence MRN: [id number] DOB:1971/03/11, 51 y.o., female Today's Date: 05/23/2022  PCP: [facility]  REFERRING PROVIDER: Harriette Bouillon, PA-C (Dr. Hillery Hunter)   OT End of Session - 05/23/22 1554     Visit Number 7    Number of Visits 16    Date for OT Re-Evaluation 06/28/22    Authorization Type BCBS    OT Start Time 1520    OT Stop Time 1558    OT Time Calculation (min) 38 min    Activity Tolerance Patient tolerated treatment well    Behavior During Therapy WFL for tasks assessed/performed                   Past Medical History:  Diagnosis Date   Abnormal Pap smear of cervix    Anemia    issue with red blood cells being small   Anxiety    Arthritis    Carpal tunnel syndrome    right   Depression    DVT (deep venous thrombosis) (HCC) 03/2021   left arm   GERD (gastroesophageal reflux disease)    High blood pressure    History of hiatal hernia    Left breast mass    Pre-diabetes    Past Surgical History:  Procedure Laterality Date   BIOPSY  02/05/2022   Procedure: BIOPSY;  Surgeon: Lanelle Bal, DO;  Location: [facility];  Service: Endoscopy;;   COLONOSCOPY  2022   Megan Straughan: 7 polyps removed, has to return in one year   COLONOSCOPY  09/2021   Megan Straughan: 12 polyps removed   ESOPHAGOGASTRODUODENOSCOPY (EGD) WITH PROPOFOL N/A 02/05/2022   Procedure: ESOPHAGOGASTRODUODENOSCOPY (EGD) WITH PROPOFOL;  Surgeon: Lanelle Bal, DO;  Location: [facility];  Service: Endoscopy;  Laterality: N/A;  2:00pm   left shoulder arthroscopy  12/2020   SHOULDER ARTHROSCOPY WITH OPEN ROTATOR CUFF REPAIR AND DISTAL CLAVICLE ACROMINECTOMY Left 03/21/2022   Procedure: LEFT SHOULDER ARTHOSCOPY, DEBRIDEMENT,  MANIPULATION, ROTATOR CUFF REPAIR, ARTHROSCOPIC DISTAL CLAVICLE EXCISION;  Surgeon: Cammy Copa, MD;  Location: [facility];  Service: Orthopedics;  Laterality:  Left;   TUBAL LIGATION     Patient Active Problem List   Diagnosis Date Noted   Arthritis of left acromioclavicular joint    Complete tear of left rotator cuff    Abdominal pain, epigastric 01/23/2022   Iron deficiency anemia 12/13/2021   Iron deficiency anemia due to chronic blood loss 12/13/2021   GERD (gastroesophageal reflux disease)    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 She reports having a previous shoulder surgery con 12/25/20 resulting in to change to shoulder condition. Pt was cleared to discontinue use of sling on 8/17 and was referred for an OT evaluation and treatment by Harriette Bouillon, PA-C.   PRECAUTIONS: Shoulder  WEIGHT BEARING RESTRICTIONS Yes NWB  PAIN:  Are you having pain? Yes: NPRS scale: 4/10 Pain location: anterior and superior shoulder  Pain description: Stiff, achy, sore Aggravating factors: Sleeping  Relieving factors: Ice    OBJECTIVE:   ADLs: Overall ADLs: Pt requires assistance with dressing, bathing, hygiene/grooming, and meal prep due to surgical precautions.  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  Pt reports difficulty sleeping, as she prefers to sleep on her left side.    FUNCTIONAL OUTCOME MEASURES: FOTO: 49.51  UPPER EXTREMITY ROM     Assessed supine, er/IR adducted  Passive ROM Left eval Left 05/07/22  Shoulder flexion 98 109   Shoulder abduction 94 96  Shoulder external rotation 40 45  (Blank rows = not tested)     Active ROM Left eval  Shoulder flexion   Shoulder abduction   Shoulder external rotation   (Blank rows = not tested)   UPPER EXTREMITY MMT:     MMT Left eval  Shoulder flexion   Shoulder abduction   Shoulder internal rotation   Shoulder external rotation   (Blank rows = not tested)  HAND FUNCTION: Grip strength: Right: 59 lbs; Left: 16 lbs    TODAY'S TREATMENT:   05/23/22 -Manual therapy: myofascial release to left upper arm, anterior shoulder, trapezius, and scapular regions to decrease pain and fascial restrictions and increase joint ROM -P/ROM: supine, flexion, abduction, er/IR, horizontal abduction, 5 reps each -AA/ROM: supine-protraction, flexion, er/IR, horizontal abduction, 10 reps each -AA/ROM: standing- protraction, flexion, er/IR, abduction, horizontal abduction, 10 reps each -Wall wash: 1' flexion, low level -UBE: level 1, 2' forward 2' reverse, pace: 3.5   05/21/22 -Manual therapy: myofascial release to left upper arm, anterior shoulder, trapezius, and scapular regions to decrease pain and fascial restrictions and increase joint ROM -P/ROM: supine, flexion, abduction, er/IR, horizontal abduction, 5 reps each -AA/ROM: supine, protraction, flexion, er/IR, horizontal abduction, 10 reps each -AA/ROM: seated, protraction, flexion, er/IR, 10 reps each -Wall wash, 1' low level -Pulleys: 1' flexion, 1' abduction  05/14/22 -Manual therapy: myofascial release to left upper arm, anterior shoulder, trapezius, and scapular regions to decrease pain and fascial restrictions and increase joint ROM -P/ROM: supine, flexion, abduction, er/IR, horizontal abduction, 10 reps each -AA/ROM: supine, protraction, flexion, er/IR, horizontal abduction, 10 reps each -Isometrics: flexion, extension, abduction, adduction, er/IR, 1x15" -AA/ROM: standing, protraction, flexion, er/IR, horizontal  abduction, abduction 8 reps each -Wall Washing: flexion and abduction 5x5 sec holds    PATIENT EDUCATION: Education details: reviewed HEP Person educated: Patient Education method: Programmer, multimedia, Facilities manager, and Handouts Education comprehension: verbalized understanding and returned demonstration   HOME EXERCISE PROGRAM: Eval: Table slides 9/19: AA/ROM and Isometrics  GOALS: Goals reviewed with patient? Yes  SHORT TERM GOALS: Target date:  05/31/22     Pt will be provided with and educated on HEP to improve mobility in LUE required for ADL completion.   Goal status: IN PROGRESS  2.  Pt will decrease pain in LUE to 3/10 or less in order to sleep for 4+ consecutive hours without waking due to pain.   Goal status: IN PROGRESS  3.  Pt will increase P/ROM in LUE to [facility] to improve ability to perform dressing tasks with minimal compensatory techniques.   Goal status: IN PROGRESS  4.  Pt will increase strength in LUE to 3/5 to improve ability to perform lifting tasks required for meal preparation at waist height  Goal status: IN PROGRESS   LONG TERM GOALS: Target date:  06/28/22     Pt will decrease pain in LUE to 1/10 or  less in order to return to work tasks.  Goal status: IN PROGRESS  2.  Pt will decrease LUE fascial restrictions to minimal amounts or less to improve mobility required for overhead reaching to retrieve clothing items from closet.   Goal status: IN PROGRESS  3.  Pt will increase A/ROM of LUE to [facility] to improve ability to reach overhead and behind back during dressing and bathing tasks.   Goal status: IN PROGRESS  4.  Pt will increase strength in LUE to 4+/5 to improve ability to perform lifting tasks required for work.   Goal status: IN PROGRESS   ASSESSMENT:  CLINICAL IMPRESSION: Pt reporting she has been completing her HEP at home. Continued with myofascial release, tender at anterior shoulder today with mod trigger point noted in upper trapezius.  Continued with P/ROM and AA/ROM, transitioned to standing versus sitting and added er/IR, abduction, and horizontal abduction. Increased time to complete exercises, pt achieving approximately 50% ROM in standing during AA/ROM. Reports discomfort in anterior deltoid and bicep regions. Added UBE today. Verbal cuing for form and technique.   PLAN: OT FREQUENCY: 2x/week  OT DURATION: 8 weeks  PLANNED INTERVENTIONS: self care/ADL training, therapeutic exercise, therapeutic activity, manual therapy, passive range of motion, moist heat, cryotherapy, patient/family education, energy conservation, coping strategies training, and DME and/or AE instructions   CONSULTED AND AGREED WITH PLAN OF CARE: Patient  PLAN FOR NEXT SESSION: Mini-reassessment, continue with AA/ROM in standing, scapular theraband, gentle self-stretches at wall     [facility], OTR/L  [phone] 05/23/2022, 4:31 PM

## 2022-05-28 ENCOUNTER — Encounter (HOSPITAL_COMMUNITY): Payer: Self-pay | Admitting: Occupational Therapy

## 2022-05-28 ENCOUNTER — Ambulatory Visit (HOSPITAL_COMMUNITY): Payer: BC Managed Care – PPO | Attending: Surgical | Admitting: Occupational Therapy

## 2022-05-28 DIAGNOSIS — R29898 Other symptoms and signs involving the musculoskeletal system: Secondary | ICD-10-CM | POA: Diagnosis present

## 2022-05-28 DIAGNOSIS — M25512 Pain in left shoulder: Secondary | ICD-10-CM | POA: Insufficient documentation

## 2022-05-28 NOTE — Therapy (Signed)
OUTPATIENT OCCUPATIONAL THERAPY ORTHO TREATMENT  Patient Name: Suzanne Lawrence MRN: [id number] DOB:1971-03-05, 51 y.o., female Today's Date: 05/28/2022  PCP: [facility] PROVIDER: Gloriann Loan, PA-C (Dr. Hortense Ramal)   OT End of Session - 05/28/22 1426     Visit Number 8    Number of Visits 16    Date for OT Re-Evaluation 06/28/22    Authorization Type BCBS    OT Start Time 1347    OT Stop Time 1426    OT Time Calculation (min) 39 min    Activity Tolerance Patient tolerated treatment well    Behavior During Therapy WFL for tasks assessed/performed                    Past Medical History:  Diagnosis Date   Abnormal Pap smear of cervix    Anemia    issue with red blood cells being small   Anxiety    Arthritis    Carpal tunnel syndrome    right   Depression    DVT (deep venous thrombosis) ([place]) 03/2021   left arm   GERD (gastroesophageal reflux disease)    High blood pressure    History of hiatal hernia    Left breast mass    Pre-diabetes    Past Surgical History:  Procedure Laterality Date   BIOPSY  02/05/2022   Procedure: BIOPSY;  Surgeon: Eloise Harman, DO;  [facility]: [facility];  Service: Endoscopy;;   COLONOSCOPY  2022   Megan Straughan: 7 polyps removed, has to return in one year   COLONOSCOPY  09/2021   Megan Straughan: 12 polyps removed   ESOPHAGOGASTRODUODENOSCOPY (EGD) WITH PROPOFOL N/A 02/05/2022   Procedure: ESOPHAGOGASTRODUODENOSCOPY (EGD) WITH PROPOFOL;  Surgeon: Eloise Harman, DO;  [facility]: [facility];  Service: Endoscopy;  Laterality: N/A;  2:00pm   left shoulder arthroscopy  12/2020   SHOULDER ARTHROSCOPY WITH OPEN ROTATOR CUFF REPAIR AND DISTAL CLAVICLE ACROMINECTOMY Left 03/21/2022   Procedure: LEFT SHOULDER ARTHOSCOPY, DEBRIDEMENT,  MANIPULATION, ROTATOR CUFF REPAIR, ARTHROSCOPIC DISTAL CLAVICLE EXCISION;  Surgeon: Meredith Pel, MD;  Location: [facility];  Service: Orthopedics;  Laterality:  Left;   TUBAL LIGATION     Patient Active Problem List   Diagnosis Date Noted   Arthritis of left acromioclavicular joint    Complete tear of left rotator cuff    Abdominal pain, epigastric 01/23/2022   Iron deficiency anemia 12/13/2021   Iron deficiency anemia due to chronic blood loss 12/13/2021   GERD (gastroesophageal reflux disease)    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 She reports having a previous shoulder surgery con 12/25/20 resulting in to change to shoulder condition. Pt was cleared to discontinue use of sling on 8/17 and was referred for an OT evaluation and treatment by Gloriann Loan, PA-C.   PRECAUTIONS: Shoulder; progress as tolerated  WEIGHT BEARING RESTRICTIONS Yes NWB  PAIN:  Are you having pain? Yes: NPRS scale: 4/10 Pain  location: anterior and superior shoulder  Pain description: Stiff, achy, sore Aggravating factors: Sleeping  Relieving factors: Ice    OBJECTIVE:   ADLs: Overall ADLs: Pt requires assistance with dressing, bathing, hygiene/grooming, and meal prep due to surgical precautions.  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  Pt reports difficulty sleeping, as she prefers to sleep on her left side.    FUNCTIONAL OUTCOME MEASURES: FOTO: 49.51 05/28/22: 53/100  UPPER EXTREMITY ROM     Assessed supine, er/IR adducted  Passive ROM  Left eval Left 05/07/22 Left 05/28/22  Shoulder flexion 98 109 152  Shoulder abduction 94 96 150  Shoulder external rotation 40 45 51  (Blank rows = not tested)    Assessed seated, er/IR adducted    Active ROM Left eval Left 05/28/22  Shoulder flexion  85  Shoulder abduction  69  Shoulder external rotation  40  (Blank rows = not tested)   UPPER EXTREMITY MMT:     Assessed seated, on observation, no formal MMT tested today  MMT Left eval Left  05/28/22  Shoulder flexion  3-/5  Shoulder abduction  3-/5  Shoulder internal rotation  3/5  Shoulder external rotation  3/5  (Blank rows = not tested)  HAND FUNCTION: Grip strength: Right: 59 lbs; Left: 16 lbs    TODAY'S TREATMENT:   05/28/22 -Manual therapy: myofascial release to left upper arm, anterior shoulder, trapezius, and scapular regions to decrease pain and fascial restrictions and increase joint ROM -P/ROM: supine, flexion, abduction, er/IR, 5 reps each -AA/ROM: supine-protraction, flexion, er/IR, 12 reps each -AA/ROM: standing- protraction, flexion, er/IR, abduction, horizontal abduction, 10 reps each -PVC pipe slide: 10 reps flexion   05/23/22 -Manual therapy: myofascial release to left upper arm, anterior shoulder, trapezius, and scapular regions to decrease pain and fascial restrictions and increase joint ROM -P/ROM: supine, flexion, abduction, er/IR, horizontal abduction, 5 reps each -AA/ROM: supine-protraction, flexion, er/IR, horizontal abduction, 10 reps each -AA/ROM: standing- protraction, flexion, er/IR, abduction, horizontal abduction, 10 reps each -Wall wash: 1' flexion, low level -UBE: level 1, 2' forward 2' reverse, pace: 3.5   05/21/22 -Manual therapy: myofascial release to left upper arm, anterior shoulder, trapezius, and scapular regions to decrease pain and fascial restrictions and increase joint ROM -P/ROM: supine, flexion, abduction, er/IR, horizontal abduction, 5 reps each -AA/ROM: supine,  protraction, flexion, er/IR, horizontal abduction, 10 reps each -AA/ROM: seated, protraction, flexion, er/IR, 10 reps each -Wall wash, 1' low level -Pulleys: 1' flexion, 1' abduction   PATIENT EDUCATION: Education details: reviewed HEP Person educated: Patient Education method: Explanation, Demonstration, and Handouts Education comprehension: verbalized understanding and returned demonstration   HOME EXERCISE PROGRAM: Eval: Table slides 9/19: AA/ROM and Isometrics  GOALS: Goals reviewed with patient? Yes  SHORT TERM GOALS: Target date:  05/31/22     Pt will be provided with and educated on HEP to improve mobility in LUE required for ADL completion.   Goal status: MET  2.  Pt will decrease pain in LUE to 3/10 or less in order to sleep for 4+ consecutive hours without waking due to pain.   Goal status: IN PROGRESS  3.  Pt will increase P/ROM in LUE to [facility] to improve ability to perform dressing tasks with minimal compensatory techniques.   Goal status: MET  4.  Pt will increase strength in LUE to 3/5 to improve ability to perform lifting tasks required for meal preparation at waist height  Goal  status: IN PROGRESS   LONG TERM GOALS: Target date:  06/28/22     Pt will decrease pain in LUE to 1/10 or less in order to return to work tasks.  Goal status: IN PROGRESS  2.  Pt will decrease LUE fascial restrictions to minimal amounts or less to improve mobility required for overhead reaching to retrieve clothing items from closet.   Goal status: IN PROGRESS  3.  Pt will increase A/ROM of LUE to [facility] to improve ability to reach overhead and behind back during dressing and bathing tasks.   Goal status: IN PROGRESS  4.  Pt will increase strength in LUE to 4+/5 to improve ability to perform lifting tasks required for work.   Goal status: IN PROGRESS   ASSESSMENT:  CLINICAL IMPRESSION: Mini-reassessment completed this session, pt has met 2/4 STGs and is making slow, steady  progress towards remaining goals. Pt is now demonstrating ROM WFL passively, active ROM measurements taken for the first time today. Pt with increased soreness in upper arm and trapezius during exercises today, AA/ROM limited to 50% in standing today. Verbal cuing for form and technique, rest breaks provided as needed. Pt reports standing exercises are easier than supine. Added PVC pipe slide today.   PLAN: OT FREQUENCY: 2x/week  OT DURATION: 8 weeks  PLANNED INTERVENTIONS: self care/ADL training, therapeutic exercise, therapeutic activity, manual therapy, passive range of motion, moist heat, cryotherapy, patient/family education, energy conservation, coping strategies training, and DME and/or AE instructions   CONSULTED AND AGREED WITH PLAN OF CARE: Patient  PLAN FOR NEXT SESSION: continue with AA/ROM in standing, scapular theraband, gentle self-stretches at wall     [facility], OTR/L  [phone] 05/28/2022, 2:27 PM

## 2022-05-30 ENCOUNTER — Encounter (HOSPITAL_COMMUNITY): Payer: Self-pay | Admitting: Occupational Therapy

## 2022-05-30 ENCOUNTER — Ambulatory Visit (HOSPITAL_COMMUNITY): Payer: BC Managed Care – PPO | Admitting: Occupational Therapy

## 2022-05-30 DIAGNOSIS — M25512 Pain in left shoulder: Secondary | ICD-10-CM

## 2022-05-30 DIAGNOSIS — R29898 Other symptoms and signs involving the musculoskeletal system: Secondary | ICD-10-CM

## 2022-05-30 NOTE — Therapy (Signed)
OUTPATIENT OCCUPATIONAL THERAPY ORTHO TREATMENT  Patient Name: Suzanne Lawrence MRN: [id number] DOB:1970-09-12, 51 y.o., female Today's Date: 05/30/2022  PCP: [facility] PROVIDER: Gloriann Loan, PA-C (Dr. Hortense Ramal)   OT End of Session - 05/30/22 1336     Visit Number 9    Number of Visits 16    Date for OT Re-Evaluation 06/28/22    Authorization Type BCBS    OT Start Time 1302    OT Stop Time 1340    OT Time Calculation (min) 38 min    Activity Tolerance Patient tolerated treatment well    Behavior During Therapy WFL for tasks assessed/performed                     Past Medical History:  Diagnosis Date   Abnormal Pap smear of cervix    Anemia    issue with red blood cells being small   Anxiety    Arthritis    Carpal tunnel syndrome    right   Depression    DVT (deep venous thrombosis) ([place]) 03/2021   left arm   GERD (gastroesophageal reflux disease)    High blood pressure    History of hiatal hernia    Left breast mass    Pre-diabetes    Past Surgical History:  Procedure Laterality Date   BIOPSY  02/05/2022   Procedure: BIOPSY;  Surgeon: Eloise Harman, DO;  [facility];  Service: Endoscopy;;   COLONOSCOPY  2022   Megan Straughan: 7 polyps removed, has to return in one year   COLONOSCOPY  09/2021   Megan Straughan: 12 polyps removed   ESOPHAGOGASTRODUODENOSCOPY (EGD) WITH PROPOFOL N/A 02/05/2022   Procedure: ESOPHAGOGASTRODUODENOSCOPY (EGD) WITH PROPOFOL;  Surgeon: Eloise Harman, DO;  [facility];  Service: Endoscopy;  Laterality: N/A;  2:00pm   left shoulder arthroscopy  12/2020   SHOULDER ARTHROSCOPY WITH OPEN ROTATOR CUFF REPAIR AND DISTAL CLAVICLE ACROMINECTOMY Left 03/21/2022   Procedure: LEFT SHOULDER ARTHOSCOPY, DEBRIDEMENT,  MANIPULATION, ROTATOR CUFF REPAIR, ARTHROSCOPIC DISTAL CLAVICLE EXCISION;  Surgeon: Meredith Pel, MD;  Location: [facility];  Service: Orthopedics;  Laterality:  Left;   TUBAL LIGATION     Patient Active Problem List   Diagnosis Date Noted   Arthritis of left acromioclavicular joint    Complete tear of left rotator cuff    Abdominal pain, epigastric 01/23/2022   Iron deficiency anemia 12/13/2021   Iron deficiency anemia due to chronic blood loss 12/13/2021   GERD (gastroesophageal reflux disease)    Cubital tunnel syndrome of both upper extremities 01/25/2020   High blood pressure    Depression    Carpal tunnel syndrome    Anxiety     ONSET DATE: 03/21/22  REFERRING DIAG: C48.889 (ICD-10-CM) - Status post rotator cuff repair  THERAPY DIAG:  Other symptoms and signs involving the musculoskeletal system  Acute pain of left shoulder  Rationale for Evaluation and Treatment Rehabilitation  SUBJECTIVE:   SUBJECTIVE STATEMENT: S: "The pain is right in the top today."    PERTINENT HISTORY: Pt presents 6 weeks s/p left rotator cuff repair complete 03/21/22. She reports having a previous shoulder surgery con 12/25/20 resulting in to change to shoulder condition. Pt was cleared to discontinue use of sling on 8/17 and was referred for an OT evaluation and treatment by Gloriann Loan, PA-C.   PRECAUTIONS: Shoulder; progress as tolerated  WEIGHT BEARING RESTRICTIONS Yes NWB  PAIN:  Are you having pain? Yes: NPRS  scale: 4/10 Pain location: anterior and superior shoulder  Pain description: Stiff, achy, sore Aggravating factors: Sleeping  Relieving factors: Ice    OBJECTIVE:   ADLs: Overall ADLs: Pt requires assistance with dressing, bathing, hygiene/grooming, and meal prep due to surgical precautions.  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  Pt reports difficulty sleeping, as she prefers to sleep on her left side.    FUNCTIONAL OUTCOME MEASURES: FOTO: 49.51 05/28/22: 53/100  UPPER EXTREMITY ROM     Assessed supine, er/IR adducted  Passive ROM  Left eval Left 05/07/22 Left 05/28/22  Shoulder flexion 98 109 152  Shoulder abduction 94 96 150  Shoulder external rotation 40 45 51  (Blank rows = not tested)    Assessed seated, er/IR adducted    Active ROM Left eval Left 05/28/22  Shoulder flexion  85  Shoulder abduction  69  Shoulder external rotation  40  (Blank rows = not tested)   UPPER EXTREMITY MMT:     Assessed seated, on observation, no formal MMT tested today  MMT Left eval Left  05/28/22  Shoulder flexion  3-/5  Shoulder abduction  3-/5  Shoulder internal rotation  3/5  Shoulder external rotation  3/5  (Blank rows = not tested)  HAND FUNCTION: Grip strength: Right: 59 lbs; Left: 16 lbs    TODAY'S TREATMENT:   05/30/22 -Manual therapy: myofascial release to left upper arm, anterior shoulder, trapezius, and scapular regions to decrease pain and fascial restrictions and increase joint ROM -P/ROM: supine, flexion, abduction, er/IR, 5 reps each -AA/ROM: standing- protraction, flexion, er/IR, abduction, horizontal abduction, 10 reps each -Shoulder stretches: flexion, abduction, doorway stretch, posterior capsule stretch, er at wall, IR behind back with horizontal towel, 2x10" each -Scapular theraband: red-row, extension, 10 reps -Pulleys: 1' flexion, 1' abduction -UBE: Level 1, 3' forward, 3' reverse, pace: 3.0   05/28/22 -Manual therapy: myofascial release to left upper arm, anterior shoulder, trapezius, and scapular regions to decrease pain and fascial restrictions and increase joint ROM -P/ROM: supine, flexion, abduction, er/IR, 5 reps each -AA/ROM: supine-protraction, flexion, er/IR, 12 reps each -AA/ROM: standing- protraction, flexion, er/IR, abduction, horizontal abduction, 10 reps each -PVC pipe slide: 10 reps flexion   05/23/22 -Manual therapy: myofascial release to left upper arm, anterior shoulder, trapezius, and scapular regions to decrease pain and fascial restrictions and increase joint  ROM -P/ROM: supine, flexion, abduction, er/IR, horizontal abduction, 5 reps each -AA/ROM: supine-protraction, flexion, er/IR, horizontal abduction, 10 reps each -AA/ROM: standing- protraction, flexion, er/IR, abduction, horizontal abduction, 10 reps each -Wall wash: 1' flexion, low level -UBE: level 1, 2' forward 2' reverse, pace: 3.5    PATIENT EDUCATION: Education details: reviewed HEP Person educated: Patient Education method: Explanation, Demonstration, and Handouts Education comprehension: verbalized understanding and returned demonstration   HOME EXERCISE PROGRAM: Eval: Table slides 9/19: AA/ROM and Isometrics  GOALS: Goals reviewed with patient? Yes  SHORT TERM GOALS: Target date:  05/31/22     Pt will be provided with and educated on HEP to improve mobility in LUE required for ADL completion.   Goal status: MET  2.  Pt will decrease pain in LUE to 3/10 or less in order to sleep for 4+ consecutive hours without waking due to pain.   Goal status: IN PROGRESS  3.  Pt will increase P/ROM in LUE to [facility] to improve ability to perform dressing tasks with minimal compensatory techniques.   Goal status: MET  4.  Pt will increase strength in LUE to 3/5 to improve ability to perform lifting tasks required for meal preparation at waist height  Goal status: IN PROGRESS   LONG TERM GOALS: Target date:  06/28/22     Pt will decrease pain in LUE to 1/10 or less in order to return to work tasks.  Goal status: IN PROGRESS  2.  Pt will decrease LUE fascial restrictions to minimal amounts or less to improve mobility required for overhead reaching to retrieve clothing items from closet.   Goal status: IN PROGRESS  3.  Pt will increase A/ROM of LUE to [facility] to improve ability to reach overhead and behind back during dressing and bathing tasks.   Goal status: IN PROGRESS  4.  Pt will increase strength in LUE to 4+/5 to improve ability to perform lifting tasks required for work.    Goal status: IN PROGRESS   ASSESSMENT:  CLINICAL IMPRESSION: Pt reports more pain along her upper trapezius and anterior shoulder today, also reports more pain at night than during the day. Minimal manual techniques used to see if improvement in pain and tolerance without manual therapy. Continued with passive stretching, AA/ROM in standing. Added gentle shoulder stretches today, scapular theraband row and extension, completed UBE. Verbal cuing for form and technique.   PLAN: OT FREQUENCY: 2x/week  OT DURATION: 8 weeks  PLANNED INTERVENTIONS: self care/ADL training, therapeutic exercise, therapeutic activity, manual therapy, passive range of motion, moist heat, cryotherapy, patient/family education, energy conservation, coping strategies training, and DME and/or AE instructions   CONSULTED AND AGREED WITH PLAN OF CARE: Patient  PLAN FOR NEXT SESSION: continue with AA/ROM in standing, add stretches to HEP, wall wash     Guadelupe Sabin, OTR/L  [phone] 05/30/2022, 1:40 PM

## 2022-06-03 LAB — HEMOGLOBIN A1C: Hemoglobin A1C: 7.6

## 2022-06-04 ENCOUNTER — Encounter (HOSPITAL_COMMUNITY): Payer: 59 | Admitting: Occupational Therapy

## 2022-06-06 ENCOUNTER — Ambulatory Visit (HOSPITAL_COMMUNITY): Payer: BC Managed Care – PPO | Admitting: Occupational Therapy

## 2022-06-06 DIAGNOSIS — R29898 Other symptoms and signs involving the musculoskeletal system: Secondary | ICD-10-CM

## 2022-06-06 DIAGNOSIS — M25512 Pain in left shoulder: Secondary | ICD-10-CM

## 2022-06-06 NOTE — Therapy (Signed)
OUTPATIENT OCCUPATIONAL THERAPY ORTHO TREATMENT  Patient Name: Suzanne Lawrence MRN: [id number] DOB:10-19-1970, 51 y.o., female Today's Date: 06/07/2022  PCP: [facility] PROVIDER: Gloriann Loan, PA-C (Dr. Hortense Ramal)   OT End of Session - 06/07/22 1110     Visit Number 10    Number of Visits 16    Date for OT Re-Evaluation 06/28/22    Authorization Type BCBS    Progress Note Due on Visit 11    OT Start Time 1300    OT Stop Time 1342    OT Time Calculation (min) 42 min    Activity Tolerance Patient tolerated treatment well    Behavior During Therapy WFL for tasks assessed/performed             Past Medical History:  Diagnosis Date   Abnormal Pap smear of cervix    Anemia    issue with red blood cells being small   Anxiety    Arthritis    Carpal tunnel syndrome    right   Depression    DVT (deep venous thrombosis) ([place]) 03/2021   left arm   GERD (gastroesophageal reflux disease)    High blood pressure    History of hiatal hernia    Left breast mass    Pre-diabetes    Past Surgical History:  Procedure Laterality Date   BIOPSY  02/05/2022   Procedure: BIOPSY;  Surgeon: Eloise Harman, DO;  [facility];  Service: Endoscopy;;   COLONOSCOPY  2022   Megan Straughan: 7 polyps removed, has to return in one year   COLONOSCOPY  09/2021   Megan Straughan: 12 polyps removed   ESOPHAGOGASTRODUODENOSCOPY (EGD) WITH PROPOFOL N/A 02/05/2022   Procedure: ESOPHAGOGASTRODUODENOSCOPY (EGD) WITH PROPOFOL;  Surgeon: Eloise Harman, DO;  [facility];  Service: Endoscopy;  Laterality: N/A;  2:00pm   left shoulder arthroscopy  12/2020   SHOULDER ARTHROSCOPY WITH OPEN ROTATOR CUFF REPAIR AND DISTAL CLAVICLE ACROMINECTOMY Left 03/21/2022   Procedure: LEFT SHOULDER ARTHOSCOPY, DEBRIDEMENT,  MANIPULATION, ROTATOR CUFF REPAIR, ARTHROSCOPIC DISTAL CLAVICLE EXCISION;  Surgeon: Meredith Pel, MD;  Location: [facility];  Service:  Orthopedics;  Laterality: Left;   TUBAL LIGATION     Patient Active Problem List   Diagnosis Date Noted   Arthritis of left acromioclavicular joint    Complete tear of left rotator cuff    Abdominal pain, epigastric 01/23/2022   Iron deficiency anemia 12/13/2021   Iron deficiency anemia due to chronic blood loss 12/13/2021   GERD (gastroesophageal reflux disease)    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 She reports having a previous shoulder surgery con 12/25/20 resulting in to change to shoulder condition. Pt was cleared to discontinue use of sling on 8/17 and was referred for an OT evaluation and treatment by Gloriann Loan, PA-C.   PRECAUTIONS: Shoulder; progress as tolerated  WEIGHT BEARING RESTRICTIONS Yes NWB  PAIN:  Are you having pain? Yes: NPRS scale: 5/10 Pain  location: anterior and superior shoulder  Pain description: Stiff, achy, sore Aggravating factors: Sleeping  Relieving factors: Ice    OBJECTIVE:   ADLs: Overall ADLs: Pt requires assistance with dressing, bathing, hygiene/grooming, and meal prep due to surgical precautions.  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  Pt reports difficulty sleeping, as she prefers to sleep on her left side.    FUNCTIONAL OUTCOME MEASURES: FOTO: 49.51 05/28/22: 53/100  UPPER EXTREMITY ROM     Assessed supine, er/IR  adducted  Passive ROM Left eval Left 05/07/22 Left 05/28/22  Shoulder flexion 98 109 152  Shoulder abduction 94 96 150  Shoulder external rotation 40 45 51  (Blank rows = not tested)    Assessed seated, er/IR adducted    Active ROM Left eval Left 05/28/22  Shoulder flexion  85  Shoulder abduction  69  Shoulder external rotation  40  (Blank rows = not tested)   UPPER EXTREMITY MMT:     Assessed seated, on observation, no formal MMT tested today  MMT Left eval Left  05/28/22  Shoulder flexion  3-/5  Shoulder abduction  3-/5  Shoulder internal rotation  3/5  Shoulder external rotation  3/5  (Blank rows = not tested)  HAND FUNCTION: Grip strength: Right: 59 lbs; Left: 16 lbs    TODAY'S TREATMENT:   06/06/22 -Manual therapy: myofascial release to left upper arm, anterior shoulder, trapezius, and scapular regions to decrease pain and fascial restrictions and increase joint ROM -P/ROM: supine, flexion, abduction, horizontal abduction, er/IR, 10 reps each -AA/ROM: supine, protraction, flexion, er/IR, abduction, horizontal abduction, 10 reps each -Therapy ball stretches: flexion, abduction, 1x10 with 5-10 second hold -AA/ROM: standing- protraction, flexion, er/IR, abduction, horizontal abduction, 10 reps each -Shoulder stretches: flexion, abduction, doorway stretch, posterior capsule stretch, er at wall, 4x15"  05/30/22 -Manual therapy: myofascial release to left upper arm, anterior shoulder, trapezius, and scapular regions to decrease pain and fascial restrictions and increase joint ROM -P/ROM: supine, flexion, abduction, er/IR, 5 reps each -AA/ROM: standing- protraction, flexion, er/IR, abduction, horizontal abduction, 10 reps each -Shoulder stretches: flexion, abduction, doorway stretch, posterior capsule stretch, er at wall, IR behind back with horizontal towel, 2x10" each -Scapular theraband: red-row, extension, 10 reps -Pulleys: 1' flexion, 1' abduction -UBE: Level 1,  3' forward, 3' reverse, pace: 3.0  05/28/22 -Manual therapy: myofascial release to left upper arm, anterior shoulder, trapezius, and scapular regions to decrease pain and fascial restrictions and increase joint ROM -P/ROM: supine, flexion, abduction, er/IR, 5 reps each -AA/ROM: supine-protraction, flexion, er/IR, 12 reps each -AA/ROM: standing- protraction, flexion, er/IR, abduction, horizontal abduction, 10 reps each -PVC pipe slide: 10 reps flexion   PATIENT EDUCATION: Education details: Building control surveyor Person educated: Patient Education method: Explanation, Demonstration, and Handouts Education comprehension: verbalized understanding and returned demonstration   HOME EXERCISE PROGRAM: Eval: Table slides 9/19: AA/ROM and Isometrics 10/12: shoulder stretches  GOALS: Goals reviewed with patient? Yes  SHORT TERM GOALS: Target date:  05/31/22     Pt will be provided with and educated on HEP to improve mobility in LUE required for ADL completion.   Goal status: MET  2.  Pt will decrease pain in LUE to 3/10 or less in order to sleep for 4+ consecutive hours without waking due to pain.   Goal status: IN PROGRESS  3.  Pt will increase P/ROM in LUE to [facility] to improve ability to perform dressing tasks with minimal compensatory techniques.  Goal status: MET  4.  Pt will increase strength in LUE to 3/5 to improve ability to perform lifting tasks required for meal preparation at waist height  Goal status: IN PROGRESS   LONG TERM GOALS: Target date:  06/28/22     Pt will decrease pain in LUE to 1/10 or less in order to return to work tasks.  Goal status: IN PROGRESS  2.  Pt will decrease LUE fascial restrictions to minimal amounts or less to improve mobility required for overhead reaching to retrieve clothing items from closet.   Goal status: IN PROGRESS  3.  Pt will increase A/ROM of LUE to [facility] to improve ability to reach overhead and behind back during dressing and bathing  tasks.   Goal status: IN PROGRESS  4.  Pt will increase strength in LUE to 4+/5 to improve ability to perform lifting tasks required for work.   Goal status: IN PROGRESS   ASSESSMENT:  CLINICAL IMPRESSION: Pt continues to have increased pain levels, restricting her from sleeping well, as well as completing most ADL's with out assist. ROM has improved this session with near [facility] ranges during all AA/ROM. Pt was able to complete supine and standing ROM, as well as therapy ball stretches on the wall. Therapist providing min verbal cuing for technique and postioning. Continued with gentle stretches to assist with loosening up restrictions. Pt continues to benefit from skilled OT to maximize ROM and strength in order to improve independence with ADL's and IADL's.   PLAN: OT FREQUENCY: 2x/week  OT DURATION: 8 weeks  PLANNED INTERVENTIONS: self care/ADL training, therapeutic exercise, therapeutic activity, manual therapy, passive range of motion, moist heat, cryotherapy, patient/family education, energy conservation, coping strategies training, and DME and/or AE instructions   CONSULTED AND AGREED WITH PLAN OF CARE: Patient  PLAN FOR NEXT SESSION: continue with AA/ROM in standing, stretches, wall wash, start supine A/ROM     Paulita Fujita, OTR/L [phone] 06/07/2022, 11:14 AM

## 2022-06-07 ENCOUNTER — Encounter (HOSPITAL_COMMUNITY): Payer: BC Managed Care – PPO | Admitting: Occupational Therapy

## 2022-06-07 ENCOUNTER — Encounter (HOSPITAL_COMMUNITY): Payer: Self-pay | Admitting: Occupational Therapy

## 2022-06-12 ENCOUNTER — Ambulatory Visit (HOSPITAL_COMMUNITY): Payer: BC Managed Care – PPO | Admitting: Occupational Therapy

## 2022-06-12 ENCOUNTER — Encounter (HOSPITAL_COMMUNITY): Payer: Self-pay | Admitting: Occupational Therapy

## 2022-06-12 DIAGNOSIS — R29898 Other symptoms and signs involving the musculoskeletal system: Secondary | ICD-10-CM

## 2022-06-12 DIAGNOSIS — M25512 Pain in left shoulder: Secondary | ICD-10-CM

## 2022-06-12 NOTE — Therapy (Signed)
OUTPATIENT OCCUPATIONAL THERAPY ORTHO TREATMENT  Patient Name: Suzanne Lawrence MRN: [id number] DOB:Sep 10, 1970, 51 y.o., female Today's Date: 06/12/2022  PCP: [place] PROVIDER: Gloriann Loan, PA-C (Dr. Hortense Ramal)   OT End of Session - 06/12/22 1427     Visit Number 11    Number of Visits 16    Date for OT Re-Evaluation 06/28/22    Authorization Type BCBS    OT Start Time 1256    OT Stop Time 1336    OT Time Calculation (min) 40 min    Activity Tolerance Patient tolerated treatment well    Behavior During Therapy WFL for tasks assessed/performed              Past Medical History:  Diagnosis Date   Abnormal Pap smear of cervix    Anemia    issue with red blood cells being small   Anxiety    Arthritis    Carpal tunnel syndrome    right   Depression    DVT (deep venous thrombosis) ([place]) 03/2021   left arm   GERD (gastroesophageal reflux disease)    High blood pressure    History of hiatal hernia    Left breast mass    Pre-diabetes    Past Surgical History:  Procedure Laterality Date   BIOPSY  02/05/2022   Procedure: BIOPSY;  Surgeon: Eloise Harman, DO;  [facility];  Service: Endoscopy;;   COLONOSCOPY  2022   Megan Straughan: 7 polyps removed, has to return in one year   COLONOSCOPY  09/2021   Megan Straughan: 12 polyps removed   ESOPHAGOGASTRODUODENOSCOPY (EGD) WITH PROPOFOL N/A 02/05/2022   Procedure: ESOPHAGOGASTRODUODENOSCOPY (EGD) WITH PROPOFOL;  Surgeon: Eloise Harman, DO;  [facility];  Service: Endoscopy;  Laterality: N/A;  2:00pm   left shoulder arthroscopy  12/2020   SHOULDER ARTHROSCOPY WITH OPEN ROTATOR CUFF REPAIR AND DISTAL CLAVICLE ACROMINECTOMY Left 03/21/2022   Procedure: LEFT SHOULDER ARTHOSCOPY, DEBRIDEMENT,  MANIPULATION, ROTATOR CUFF REPAIR, ARTHROSCOPIC DISTAL CLAVICLE EXCISION;  Surgeon: Meredith Pel, MD;  Location: [place];  Service: Orthopedics;  Laterality: Left;    TUBAL LIGATION     Patient Active Problem List   Diagnosis Date Noted   Arthritis of left acromioclavicular joint    Complete tear of left rotator cuff    Abdominal pain, epigastric 01/23/2022   Iron deficiency anemia 12/13/2021   Iron deficiency anemia due to chronic blood loss 12/13/2021   GERD (gastroesophageal reflux disease)    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 She reports having a previous shoulder surgery con 12/25/20 resulting in to change to shoulder condition. Pt was cleared to discontinue use of sling on 8/17 and was referred for an OT evaluation and treatment by Gloriann Loan, PA-C.   PRECAUTIONS: Shoulder; progress as tolerated  WEIGHT BEARING RESTRICTIONS Yes NWB  PAIN:  Are you having pain? Yes: NPRS scale: 3/10 Pain location: anterior and superior  shoulder  Pain description: Stiff, achy, sore Aggravating factors: Sleeping  Relieving factors: Ice    OBJECTIVE:   ADLs: Overall ADLs: Pt requires assistance with dressing, bathing, hygiene/grooming, and meal prep due to surgical precautions.  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  Pt reports difficulty sleeping, as she prefers to sleep on her left side.    FUNCTIONAL OUTCOME MEASURES: FOTO: 49.51 05/28/22: 53/100  UPPER EXTREMITY ROM     Assessed supine, er/IR adducted  Passive ROM  Left eval Left 05/07/22 Left 05/28/22  Shoulder flexion 98 109 152  Shoulder abduction 94 96 150  Shoulder external rotation 40 45 51  (Blank rows = not tested)    Assessed seated, er/IR adducted    Active ROM Left eval Left 05/28/22  Shoulder flexion  85  Shoulder abduction  69  Shoulder external rotation  40  (Blank rows = not tested)   UPPER EXTREMITY MMT:     Assessed seated, on observation, no formal MMT tested today  MMT Left eval Left  05/28/22  Shoulder flexion  3-/5  Shoulder abduction  3-/5  Shoulder internal rotation  3/5  Shoulder external rotation  3/5  (Blank rows = not tested)  HAND FUNCTION: Grip strength: Right: 59 lbs; Left: 16 lbs    TODAY'S TREATMENT:   06/12/22 -Manual therapy: myofascial release to left upper arm, anterior shoulder, trapezius, and scapular regions to decrease pain and fascial restrictions and increase joint ROM -P/ROM: seated- flexion, abduction, horizontal abduction, er/IR, 10 reps each -A/ROM: seated-protraction, horizontal abduction, 5 reps -AA/ROM: seated-flexion, er/IR, abduction, 10 reps -Wall wash: 1' flexion  06/06/22 -Manual therapy: myofascial release to left upper arm, anterior shoulder, trapezius, and scapular regions to decrease pain and fascial restrictions and increase joint ROM -P/ROM: supine, flexion, abduction, horizontal abduction, er/IR, 10 reps each -AA/ROM: supine, protraction, flexion, er/IR, abduction, horizontal abduction, 10 reps each -Therapy ball stretches: flexion, abduction, 1x10 with 5-10 second hold -AA/ROM: standing- protraction, flexion, er/IR, abduction, horizontal abduction, 10 reps each -Shoulder stretches: flexion, abduction, doorway stretch, posterior capsule stretch, er at wall, 4x15"  05/30/22 -Manual therapy: myofascial release to left upper arm, anterior shoulder, trapezius, and scapular regions to decrease pain and fascial restrictions and increase joint ROM -P/ROM: supine, flexion,  abduction, er/IR, 5 reps each -AA/ROM: standing- protraction, flexion, er/IR, abduction, horizontal abduction, 10 reps each -Shoulder stretches: flexion, abduction, doorway stretch, posterior capsule stretch, er at wall, IR behind back with horizontal towel, 2x10" each -Scapular theraband: red-row, extension, 10 reps -Pulleys: 1' flexion, 1' abduction -UBE: Level 1, 3' forward, 3' reverse, pace: 3.0     PATIENT EDUCATION: Education details: discussed progressing to a few reps of A/ROM as able  Person educated: Patient Education method: Explanation, Demonstration, and Handouts Education comprehension: verbalized understanding and returned demonstration   HOME EXERCISE PROGRAM: Eval: Table slides 9/19: AA/ROM and Isometrics 10/12: shoulder stretches  GOALS: Goals reviewed with patient? Yes  SHORT TERM GOALS: Target date:  05/31/22     Pt will be provided with and educated on HEP to improve mobility in LUE required for ADL completion.   Goal status: MET  2.  Pt will decrease pain in LUE to 3/10 or less in order to sleep for 4+ consecutive hours without waking due to pain.   Goal status: IN PROGRESS  3.  Pt will increase P/ROM in LUE to [facility] to improve ability to perform dressing tasks with minimal compensatory  techniques.   Goal status: MET  4.  Pt will increase strength in LUE to 3/5 to improve ability to perform lifting tasks required for meal preparation at waist height  Goal status: IN PROGRESS   LONG TERM GOALS: Target date:  06/28/22     Pt will decrease pain in LUE to 1/10 or less in order to return to work tasks.  Goal status: IN PROGRESS  2.  Pt will decrease LUE fascial restrictions to minimal amounts or less to improve mobility required for overhead reaching to retrieve clothing items from closet.   Goal status: IN PROGRESS  3.  Pt will increase A/ROM of LUE to [facility] to improve ability to reach overhead and behind back during dressing and bathing tasks.    Goal status: IN PROGRESS  4.  Pt will increase strength in LUE to 4+/5 to improve ability to perform lifting tasks required for work.   Goal status: IN PROGRESS   ASSESSMENT:  CLINICAL IMPRESSION: Pt reports she is completing her HEP, it's rough but she gets it done. Pt also reports a fall this weekend, landed on her right side. Myofascial release completed to upper arm, trapezius, and scapular regions, pt with increased tightness along upper arm. P/ROM completed in sitting, pt with ROM at 75%+ range and has less pain with stretching while seated than when she is in supine. Progress to A/ROM for protraction and horizontal abduction, increased time required for tasks due to fatigue. Pt reports her LUE feels really weak in comparison to her RUE. Continued with some AA/ROM, taking breaks between exercises. Added proximal shoulder strengthening and completed wall wash. Verbal cuing for form and technique, cuing to keep upright posture and not to lean back to compensate.    PLAN: OT FREQUENCY: 2x/week  OT DURATION: 8 weeks  PLANNED INTERVENTIONS: self care/ADL training, therapeutic exercise, therapeutic activity, manual therapy, passive range of motion, moist heat, cryotherapy, patient/family education, energy conservation, coping strategies training, and DME and/or AE instructions   CONSULTED AND AGREED WITH PLAN OF CARE: Patient  PLAN FOR NEXT SESSION: continue working to progress towards A/ROM     Guadelupe Sabin, OTR/L  [phone] 06/12/2022, 2:28 PM

## 2022-06-14 ENCOUNTER — Encounter (HOSPITAL_COMMUNITY): Payer: BC Managed Care – PPO | Admitting: Occupational Therapy

## 2022-06-18 ENCOUNTER — Encounter (HOSPITAL_COMMUNITY): Payer: BC Managed Care – PPO | Admitting: Occupational Therapy

## 2022-06-19 ENCOUNTER — Encounter: Payer: Self-pay | Admitting: Orthopedic Surgery

## 2022-06-19 ENCOUNTER — Ambulatory Visit (INDEPENDENT_AMBULATORY_CARE_PROVIDER_SITE_OTHER): Payer: BC Managed Care – PPO

## 2022-06-19 ENCOUNTER — Ambulatory Visit (INDEPENDENT_AMBULATORY_CARE_PROVIDER_SITE_OTHER): Payer: BC Managed Care – PPO | Admitting: Orthopedic Surgery

## 2022-06-19 DIAGNOSIS — G8929 Other chronic pain: Secondary | ICD-10-CM | POA: Diagnosis not present

## 2022-06-19 DIAGNOSIS — M25561 Pain in right knee: Secondary | ICD-10-CM

## 2022-06-21 ENCOUNTER — Ambulatory Visit (HOSPITAL_COMMUNITY): Payer: BC Managed Care – PPO | Admitting: Occupational Therapy

## 2022-06-21 DIAGNOSIS — M25512 Pain in left shoulder: Secondary | ICD-10-CM | POA: Diagnosis not present

## 2022-06-21 DIAGNOSIS — R29898 Other symptoms and signs involving the musculoskeletal system: Secondary | ICD-10-CM

## 2022-06-21 NOTE — Patient Instructions (Signed)

## 2022-06-21 NOTE — Therapy (Signed)
OUTPATIENT OCCUPATIONAL THERAPY ORTHO TREATMENT  Patient Name: Suzanne Lawrence MRN: [id number] DOB:[birth year], 51 y.o., female Today's Date: 06/23/2022  PCP: [place] PROVIDER: Gloriann Loan, PA-C (Dr. Hortense Ramal)   OT End of Session - 06/23/22 1950     Visit Number 12    Number of Visits 16    Date for OT Re-Evaluation 06/28/22    Authorization Type BCBS    OT Start Time 1118    OT Stop Time 1203    OT Time Calculation (min) 45 min    Activity Tolerance Patient tolerated treatment well    Behavior During Therapy WFL for tasks assessed/performed             Past Medical History:  Diagnosis Date   Abnormal Pap smear of cervix    Anemia    issue with red blood cells being small   Anxiety    Arthritis    Carpal tunnel syndrome    right   Depression    DVT (deep venous thrombosis) ([place]) 03/2021   left arm   GERD (gastroesophageal reflux disease)    High blood pressure    History of hiatal hernia    Left breast mass    Pre-diabetes    Past Surgical History:  Procedure Laterality Date   BIOPSY  02/05/2022   Procedure: BIOPSY;  Surgeon: Eloise Harman, DO;  [facility];  Service: Endoscopy;;   COLONOSCOPY  2022   Megan Straughan: 7 polyps removed, has to return in one year   COLONOSCOPY  09/2021   Megan Straughan: 12 polyps removed   ESOPHAGOGASTRODUODENOSCOPY (EGD) WITH PROPOFOL N/A 02/05/2022   Procedure: ESOPHAGOGASTRODUODENOSCOPY (EGD) WITH PROPOFOL;  Surgeon: Eloise Harman, DO;  [facility];  Service: Endoscopy;  Laterality: N/A;  2:00pm   left shoulder arthroscopy  12/2020   SHOULDER ARTHROSCOPY WITH OPEN ROTATOR CUFF REPAIR AND DISTAL CLAVICLE ACROMINECTOMY Left 03/21/2022   Procedure: LEFT SHOULDER ARTHOSCOPY, DEBRIDEMENT,  MANIPULATION, ROTATOR CUFF REPAIR, ARTHROSCOPIC DISTAL CLAVICLE EXCISION;  Surgeon: Meredith Pel, MD;  Location: [place];  Service: Orthopedics;  Laterality: Left;   TUBAL  LIGATION     Patient Active Problem List   Diagnosis Date Noted   Arthritis of left acromioclavicular joint    Complete tear of left rotator cuff    Abdominal pain, epigastric 01/23/2022   Iron deficiency anemia 12/13/2021   Iron deficiency anemia due to chronic blood loss 12/13/2021   GERD (gastroesophageal reflux disease)    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 She reports having a previous shoulder surgery con 12/25/20 resulting in to change to shoulder condition. Pt was cleared to discontinue use of sling on 8/17 and was referred for an OT evaluation and treatment by Gloriann Loan, PA-C.   PRECAUTIONS: Shoulder; progress as tolerated  WEIGHT BEARING RESTRICTIONS Yes NWB  PAIN:  Are you having pain? Yes: NPRS scale: 3/10 Pain location: anterior and superior shoulder  Pain description: Stiff, achy, sore Aggravating factors: Sleeping  Relieving factors: Ice    OBJECTIVE:   ADLs: Overall ADLs: Pt requires assistance with dressing, bathing, hygiene/grooming, and meal prep due to surgical precautions.  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  Pt reports difficulty sleeping, as she prefers to sleep on her left side.    FUNCTIONAL OUTCOME MEASURES: FOTO: 49.51 05/28/22: 53/100  UPPER EXTREMITY ROM     Assessed supine, er/IR adducted  Passive ROM Left eval  Left 05/07/22 Left 05/28/22  Shoulder flexion 98 109 152  Shoulder abduction 94 96 150  Shoulder external rotation 40 45 51  (Blank rows = not tested)    Assessed seated, er/IR adducted    Active ROM Left eval Left 05/28/22  Shoulder flexion  85  Shoulder abduction  69  Shoulder external rotation  40  (Blank rows = not tested)   UPPER EXTREMITY MMT:     Assessed seated, on observation, no formal MMT tested today  MMT Left eval Left  05/28/22  Shoulder flexion  3-/5  Shoulder abduction  3-/5  Shoulder internal rotation  3/5  Shoulder external rotation  3/5  (Blank rows = not tested)  HAND FUNCTION: Grip strength: Right: 59 lbs; Left: 16 lbs    TODAY'S TREATMENT:  06/21/22 -Manual therapy: myofascial release to left upper arm, anterior shoulder, trapezius, and scapular regions to decrease pain and fascial restrictions and increase joint ROM -AA/ROM: seated, flexion, abduction, protraction, horizontal abduction, er/IR, 1x10 -P/ROM: seated, flexion, abduction, er/IR, 1x5, 5 second hold in end range -A/ROM: seated, flexion, abduction, protraction, horizontal abduction, er/IR, 1x8 -Scapular ROM: retraction, rows, extension, standing, 1x10  06/12/22 -Manual therapy: myofascial release to left upper arm, anterior shoulder, trapezius, and scapular regions to decrease pain and fascial restrictions and increase joint ROM -P/ROM: seated- flexion, abduction, horizontal abduction, er/IR, 10 reps each -A/ROM: seated-protraction, horizontal abduction, 5 reps -AA/ROM: seated-flexion, er/IR, abduction, 10 reps -Wall wash: 1' flexion  06/06/22 -Manual therapy: myofascial release to left upper arm, anterior shoulder, trapezius, and scapular regions to decrease pain and fascial restrictions and increase joint ROM -P/ROM: supine, flexion, abduction, horizontal abduction, er/IR, 10 reps each -AA/ROM: supine, protraction, flexion, er/IR, abduction, horizontal abduction, 10 reps  each -Therapy ball stretches: flexion, abduction, 1x10 with 5-10 second hold -AA/ROM: standing- protraction, flexion, er/IR, abduction, horizontal abduction, 10 reps each -Shoulder stretches: flexion, abduction, doorway stretch, posterior capsule stretch, er at wall, 4x15"   PATIENT EDUCATION: Education details: Scapula ROM/strengthening Person educated: Patient Education method: Explanation, Demonstration, and Handouts Education comprehension: verbalized understanding and returned demonstration   HOME EXERCISE PROGRAM: Eval: Table slides 9/19: AA/ROM and Isometrics 10/12: shoulder stretches 10/27: scapula ROM/strengthening (provided yellow theraband)  GOALS: Goals reviewed with patient? Yes  SHORT TERM GOALS: Target date:  05/31/22     Pt will be provided with and educated on HEP to improve mobility in LUE required for ADL completion.   Goal status: MET  2.  Pt will decrease pain in LUE to 3/10 or less in order to sleep for 4+ consecutive hours without waking due to pain.   Goal status: IN PROGRESS  3.  Pt will increase P/ROM in LUE to [facility] to improve ability to perform dressing tasks with minimal compensatory techniques.   Goal status: MET  4.  Pt will increase strength in LUE to 3/5 to improve ability to perform lifting tasks required for meal preparation at waist  height  Goal status: IN PROGRESS   LONG TERM GOALS: Target date:  06/28/22     Pt will decrease pain in LUE to 1/10 or less in order to return to work tasks.  Goal status: IN PROGRESS  2.  Pt will decrease LUE fascial restrictions to minimal amounts or less to improve mobility required for overhead reaching to retrieve clothing items from closet.   Goal status: IN PROGRESS  3.  Pt will increase A/ROM of LUE to [facility] to improve ability to reach overhead and behind back during dressing and bathing tasks.   Goal status: IN PROGRESS  4.  Pt will increase strength in LUE to 4+/5 to improve ability to perform  lifting tasks required for work.   Goal status: IN PROGRESS   ASSESSMENT:  CLINICAL IMPRESSION: Pt presenting to therapy with increased pain and tension in entirity of LUE. Therapist providing manual therapy, as well as P/ROM with end range stretching. This session pt demonstrating decreased A/ROM, only able to achieve approximately 40% of full ROM. Session focused on going back to the basics for ROM to ensure smooth movement pattern and technique. Reviewed pt's HEP and added scapular ROM/strengthening with a yellow theraband to start slowly progressing shoulder strengthening. Therapist providing verbal cuing and tactile cuing for positioning and technique.   PLAN: OT FREQUENCY: 2x/week  OT DURATION: 8 weeks  PLANNED INTERVENTIONS: self care/ADL training, therapeutic exercise, therapeutic activity, manual therapy, passive range of motion, moist heat, cryotherapy, patient/family education, energy conservation, coping strategies training, and DME and/or AE instructions   CONSULTED AND AGREED WITH PLAN OF CARE: Patient  PLAN FOR NEXT SESSION: continue working to progress A/ROM and strengthening     Paulita Fujita, OTR/L [phone] 06/23/2022, 7:53 PM

## 2022-06-22 ENCOUNTER — Encounter: Payer: Self-pay | Admitting: Orthopedic Surgery

## 2022-06-22 NOTE — Progress Notes (Signed)
Office Visit Note   Patient: Suzanne Lawrence           Date of Birth: August 27, 1970           MRN: [id number] Visit Date: 06/19/2022 Requested by: [facility] [place],   [id number] PCP: [facility]  Subjective: Chief Complaint  Patient presents with   Left Shoulder - Pain    HPI: Suzanne Lawrence is a 51 y.o. female who presents to the office reporting right knee pain.  She fell on her right knee recently and reports right knee pain.  This was a direct impact injury with no twisting component.  Patient also underwent left shoulder rotator cuff repair distal clavicle excision on 03/21/2022.  Has had 9 visits since 05/10/2022.  She is also doing home exercise program of range of motion and stretching.  .                ROS: All systems reviewed are negative as they relate to the chief complaint within the history of present illness.  Patient denies fevers or chills.  Assessment & Plan: Visit Diagnoses:  1. Chronic pain of right knee     Plan: Impression is mild stiffness left shoulder following shoulder surgery.  Continue therapy 2 times a week plus at least 1 hour a day of home exercise program.  Patient also fell on her right knee.  Has an effusion.  Collateral crucial ligaments are stable.  Talked about an injection today but she wants to hold off on that intervention.  We can recheck this in 6 weeks and decide for or against further intervention in terms of aspiration injection and MRI scanning at that time.  Instructed her on a home exercise program with nonweightbearing quad strengthening exercises.  Follow-Up Instructions: No follow-ups on file.   Orders:  Orders Placed This Encounter  Procedures   XR KNEE 3 VIEW RIGHT   No orders of the defined types were placed in this encounter.     Procedures: No procedures performed   Clinical Data: No additional findings.  Objective: Vital Signs: There were no vitals taken for this  visit.  Physical Exam:  Constitutional: Patient appears well-developed HEENT:  Head: Normocephalic Eyes:EOM are normal Neck: Normal range of motion Cardiovascular: Normal rate Pulmonary/chest: Effort normal Neurologic: Patient is alert Skin: Skin is warm Psychiatric: Patient has normal mood and affect  Ortho Exam: Ortho exam demonstrates mild right knee effusion.  Range of motion is full.  Collateral and cruciate ligaments are stable.  No masses lymphadenopathy or skin changes noted in that right knee region.  No groin pain with internal/external Tat[place] of the leg.  Gait is normal.  Left shoulder exam demonstrates range of motion of 40/80/110.  Cuff strength is good.  No tenderness at the [facility] joint.  No crepitus with range of motion.  Specialty Comments:  No specialty comments available.  Imaging: No results found.   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 01/25/2020   High blood pressure    Depression    Carpal tunnel syndrome    Anxiety    Past Medical History:  Diagnosis Date   Abnormal Pap smear of cervix    Anemia    issue with red blood cells being small   Anxiety    Arthritis    Carpal tunnel syndrome    right   Depression    DVT (deep venous thrombosis) ([place]) 03/2021   left arm   GERD (gastroesophageal reflux disease)    High blood pressure    History of hiatal hernia    Left breast mass    Pre-diabetes     Family History  Problem Relation Age of Onset   Lung cancer Mother    Other Father        multiple issues, doesn't know history for sure   Prostate cancer Father    Stroke Father    Aneurysm Paternal  Grandmother    Breast cancer Maternal Aunt    Prostate cancer Maternal Uncle    Alcoholism Maternal Uncle    Aneurysm Cousin        paternal side   Aneurysm Cousin        paternal side    Breast cancer Cousin        maternal side    High blood pressure Other        father's side    Cerebral aneurysm Other        cerebral, on father's side    Alcoholism Other        maternal side    Colon cancer Neg Hx     Past Surgical History:  Procedure Laterality Date   BIOPSY  02/05/2022   Procedure: BIOPSY;  Surgeon: Eloise Harman, DO;  [facility];  Service: Endoscopy;;   COLONOSCOPY  2022   Megan Straughan: 7 polyps removed, has to return in one year   COLONOSCOPY  09/2021   Megan Straughan: 12 polyps removed   ESOPHAGOGASTRODUODENOSCOPY (EGD) WITH PROPOFOL N/A 02/05/2022   Procedure: ESOPHAGOGASTRODUODENOSCOPY (EGD) WITH PROPOFOL;  Surgeon: Eloise Harman, DO;  [facility];  Service: Endoscopy;  Laterality: N/A;  2:00pm   left shoulder arthroscopy  12/2020   SHOULDER ARTHROSCOPY WITH OPEN ROTATOR CUFF REPAIR AND DISTAL CLAVICLE ACROMINECTOMY Left 03/21/2022   Procedure: LEFT SHOULDER ARTHOSCOPY, DEBRIDEMENT,  MANIPULATION, ROTATOR CUFF REPAIR, ARTHROSCOPIC DISTAL CLAVICLE EXCISION;  Surgeon: Meredith Pel, MD;  Location: [place];  Service: Orthopedics;  Laterality: Left;   TUBAL LIGATION     Social History   Occupational History   Not on file  Tobacco Use   Smoking status: Every Day    Packs/day: 0.50    Types: Cigarettes   Smokeless tobacco: Never  Vaping Use   Vaping Use: Never used  Substance and Sexual Activity   Alcohol use: Not Currently   Drug use: Not Currently   Sexual activity: Yes    Birth control/protection: None

## 2022-06-23 ENCOUNTER — Encounter (HOSPITAL_COMMUNITY): Payer: Self-pay | Admitting: Occupational Therapy

## 2022-06-24 ENCOUNTER — Encounter (HOSPITAL_COMMUNITY): Payer: Self-pay | Admitting: Hematology

## 2022-06-25 ENCOUNTER — Encounter (HOSPITAL_COMMUNITY): Payer: Self-pay | Admitting: Hematology

## 2022-06-25 ENCOUNTER — Encounter (HOSPITAL_COMMUNITY): Payer: Self-pay | Admitting: Occupational Therapy

## 2022-06-25 ENCOUNTER — Ambulatory Visit (HOSPITAL_COMMUNITY): Payer: BC Managed Care – PPO | Admitting: Occupational Therapy

## 2022-06-25 DIAGNOSIS — M25512 Pain in left shoulder: Secondary | ICD-10-CM

## 2022-06-25 DIAGNOSIS — R29898 Other symptoms and signs involving the musculoskeletal system: Secondary | ICD-10-CM

## 2022-06-25 NOTE — Therapy (Signed)
OUTPATIENT OCCUPATIONAL THERAPY ORTHO TREATMENT  Patient Name: Suzanne Lawrence MRN: [id number] DOB:Sep 26, 1970, 51 y.o., female Today's Date: 06/25/2022  PCP: [place] PROVIDER: Gloriann Loan, PA-C (Dr. Hortense Ramal)    OT End of Session - 06/25/22 1102     Visit Number 13    Number of Visits 16    Date for OT Re-Evaluation 06/28/22    Authorization Type BCBS    OT Start Time [phone]    OT Stop Time 1032    OT Time Calculation (min) 41 min    Activity Tolerance Patient tolerated treatment well    Behavior During Therapy [facility] for tasks assessed/performed            Past Medical History:  Diagnosis Date   Abnormal Pap smear of cervix    Anemia    issue with red blood cells being small   Anxiety    Arthritis    Carpal tunnel syndrome    right   Depression    DVT (deep venous thrombosis) ([place]) 03/2021   left arm   GERD (gastroesophageal reflux disease)    High blood pressure    History of hiatal hernia    Left breast mass    Pre-diabetes    Past Surgical History:  Procedure Laterality Date   BIOPSY  02/05/2022   Procedure: BIOPSY;  Surgeon: Eloise Harman, DO;  [facility];  Service: Endoscopy;;   COLONOSCOPY  2022   Megan Straughan: 7 polyps removed, has to return in one year   COLONOSCOPY  09/2021   Megan Straughan: 12 polyps removed   ESOPHAGOGASTRODUODENOSCOPY (EGD) WITH PROPOFOL N/A 02/05/2022   Procedure: ESOPHAGOGASTRODUODENOSCOPY (EGD) WITH PROPOFOL;  Surgeon: Eloise Harman, DO;  [facility];  Service: Endoscopy;  Laterality: N/A;  2:00pm   left shoulder arthroscopy  12/2020   SHOULDER ARTHROSCOPY WITH OPEN ROTATOR CUFF REPAIR AND DISTAL CLAVICLE ACROMINECTOMY Left 03/21/2022   Procedure: LEFT SHOULDER ARTHOSCOPY, DEBRIDEMENT,  MANIPULATION, ROTATOR CUFF REPAIR, ARTHROSCOPIC DISTAL CLAVICLE EXCISION;  Surgeon: Meredith Pel, MD;  Location: [place];  Service: Orthopedics;  Laterality: Left;   TUBAL  LIGATION     Patient Active Problem List   Diagnosis Date Noted   Arthritis of left acromioclavicular joint    Complete tear of left rotator cuff    Abdominal pain, epigastric 01/23/2022   Iron deficiency anemia 12/13/2021   Iron deficiency anemia due to chronic blood loss 12/13/2021   GERD (gastroesophageal reflux disease)    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 She reports having a previous shoulder surgery con 12/25/20 resulting in to change to shoulder condition. Pt was cleared to discontinue use of sling on 8/17 and was referred for an OT evaluation and treatment by Gloriann Loan, PA-C.   PRECAUTIONS: Shoulder; progress as tolerated  WEIGHT BEARING RESTRICTIONS Yes NWB  PAIN:  Are you having pain? Yes: NPRS  scale: 3/10 Pain location: anterior and superior shoulder  Pain description: Stiff, achy, sore Aggravating factors: Sleeping  Relieving factors: Ice    OBJECTIVE:   ADLs: Overall ADLs: Pt requires assistance with dressing, bathing, hygiene/grooming, and meal prep due to surgical precautions.  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  Pt reports difficulty sleeping, as she prefers to sleep on her left side.    FUNCTIONAL OUTCOME MEASURES: FOTO: 49.51 05/28/22: 53/100  UPPER EXTREMITY ROM     Assessed supine,  er/IR adducted  Passive ROM Left eval Left 05/07/22 Left 05/28/22  Shoulder flexion 98 109 152  Shoulder abduction 94 96 150  Shoulder external rotation 40 45 51  (Blank rows = not tested)    Assessed seated, er/IR adducted    Active ROM Left eval Left 05/28/22  Shoulder flexion  85  Shoulder abduction  69  Shoulder external rotation  40  (Blank rows = not tested)   UPPER EXTREMITY MMT:     Assessed seated, on observation, no formal MMT tested today  MMT Left eval Left  05/28/22  Shoulder flexion  3-/5  Shoulder abduction  3-/5  Shoulder internal rotation  3/5  Shoulder external rotation  3/5  (Blank rows = not tested)  HAND FUNCTION: Grip strength: Right: 59 lbs; Left: 16 lbs    TODAY'S TREATMENT:  06/25/22 -Manual therapy: myofascial release to left upper arm, anterior shoulder, trapezius, and scapular regions to decrease pain and fascial restrictions and increase joint ROM -Ball Rolls: flexion, abduction, 1x10 -Wall slides: flexion, abduction, 1x10 -Stretches: posterior capsule stretch, doorway stretch, bicep stretch, 4x15" -AA/ROM: flexion, abduction, protraction, horizontal abduction, er/IR, 1x10, standing  06/21/22 -Manual therapy: myofascial release to left upper arm, anterior shoulder, trapezius, and scapular regions to decrease pain and fascial restrictions and increase joint ROM -AA/ROM: seated, flexion, abduction, protraction, horizontal abduction, er/IR, 1x10 -P/ROM: seated, flexion, abduction, er/IR, 1x5, 5 second hold in end range -A/ROM: seated, flexion, abduction, protraction, horizontal abduction, er/IR, 1x8 -Scapular ROM: retraction, rows, extension, standing, 1x10  06/12/22 -Manual therapy: myofascial release to left upper arm, anterior shoulder, trapezius, and scapular regions to decrease pain and fascial restrictions and increase joint ROM -P/ROM: seated- flexion, abduction, horizontal abduction, er/IR, 10 reps each -A/ROM:  seated-protraction, horizontal abduction, 5 reps -AA/ROM: seated-flexion, er/IR, abduction, 10 reps -Wall wash: 1' flexion  PATIENT EDUCATION: Education details: Review HEP Person educated: Patient Education method: Explanation, Demonstration, and Handouts Education comprehension: verbalized understanding and returned demonstration   HOME EXERCISE PROGRAM: Eval: Table slides 9/19: AA/ROM and Isometrics 10/12: shoulder stretches 10/27: scapula ROM/strengthening (provided yellow theraband)  GOALS: Goals reviewed with patient? Yes  SHORT TERM GOALS: Target date:  05/31/22     Pt will be provided with and educated on HEP to improve mobility in LUE required for ADL completion.   Goal status: MET  2.  Pt will decrease pain in LUE to 3/10 or less in order to sleep for 4+ consecutive hours without waking due to pain.   Goal status: IN PROGRESS  3.  Pt will increase P/ROM in LUE to [facility] to improve ability to perform dressing tasks with minimal compensatory techniques.   Goal status: MET  4.  Pt will increase strength in LUE to 3/5 to improve ability to perform lifting tasks required for meal preparation at waist height  Goal status: IN PROGRESS   LONG TERM GOALS: Target date:  06/28/22  Pt will decrease pain in LUE to 1/10 or less in order to return to work tasks.  Goal status: IN PROGRESS  2.  Pt will decrease LUE fascial restrictions to minimal amounts or less to improve mobility required for overhead reaching to retrieve clothing items from closet.   Goal status: IN PROGRESS  3.  Pt will increase A/ROM of LUE to [facility] to improve ability to reach overhead and behind back during dressing and bathing tasks.   Goal status: IN PROGRESS  4.  Pt will increase strength in LUE to 4+/5 to improve ability to perform lifting tasks required for work.   Goal status: IN PROGRESS   ASSESSMENT:  CLINICAL IMPRESSION: Pt presenting to therapy with continued reports of increased pain  and tension in entirity of LUE. This session focused on ROM and stretching to relieve tension and pain. Reviewed pt's HEP and encouraged her to start completing A/ROM incrementally at home laying down to improve ROM. Therapist providing verbal cuing and tactile cuing for positioning and technique.   PLAN: OT FREQUENCY: 2x/week  OT DURATION: 8 weeks  PLANNED INTERVENTIONS: self care/ADL training, therapeutic exercise, therapeutic activity, manual therapy, passive range of motion, moist heat, cryotherapy, patient/family education, energy conservation, coping strategies training, and DME and/or AE instructions   CONSULTED AND AGREED WITH PLAN OF CARE: Patient  PLAN FOR NEXT SESSION: continue working to progress A/ROM and strengthening     Paulita Fujita, OTR/L [phone] 06/25/2022, 11:05 AM

## 2022-06-27 ENCOUNTER — Ambulatory Visit (HOSPITAL_COMMUNITY): Payer: BC Managed Care – PPO | Attending: Surgical | Admitting: Occupational Therapy

## 2022-06-27 ENCOUNTER — Encounter (HOSPITAL_COMMUNITY): Payer: Self-pay | Admitting: Occupational Therapy

## 2022-06-27 DIAGNOSIS — M25512 Pain in left shoulder: Secondary | ICD-10-CM | POA: Diagnosis present

## 2022-06-27 DIAGNOSIS — R29898 Other symptoms and signs involving the musculoskeletal system: Secondary | ICD-10-CM | POA: Diagnosis not present

## 2022-06-27 NOTE — Therapy (Signed)
OUTPATIENT OCCUPATIONAL THERAPY ORTHO REASSESSMENT & TREATMENT RECERTIFICATION  Patient Name: Suzanne Lawrence MRN: [id number] DOB:1970-11-15, 51 y.o., female Today's Date: 06/27/2022  PCP: [place] PROVIDER: Gloriann Loan, PA-C (Dr. Hortense Ramal)    OT End of Session - 06/27/22 1353     Visit Number 14    Number of Visits 22    Date for OT Re-Evaluation 07/27/22    Authorization Type BCBS    OT Start Time 1304    OT Stop Time 1347    OT Time Calculation (min) 43 min    Activity Tolerance Patient tolerated treatment well    Behavior During Therapy WFL for tasks assessed/performed             Past Medical History:  Diagnosis Date   Abnormal Pap smear of cervix    Anemia    issue with red blood cells being small   Anxiety    Arthritis    Carpal tunnel syndrome    right   Depression    DVT (deep venous thrombosis) ([place]) 03/2021   left arm   GERD (gastroesophageal reflux disease)    High blood pressure    History of hiatal hernia    Left breast mass    Pre-diabetes    Past Surgical History:  Procedure Laterality Date   BIOPSY  02/05/2022   Procedure: BIOPSY;  Surgeon: Eloise Harman, DO;  [facility];  Service: Endoscopy;;   COLONOSCOPY  2022   Megan Straughan: 7 polyps removed, has to return in one year   COLONOSCOPY  09/2021   Megan Straughan: 12 polyps removed   ESOPHAGOGASTRODUODENOSCOPY (EGD) WITH PROPOFOL N/A 02/05/2022   Procedure: ESOPHAGOGASTRODUODENOSCOPY (EGD) WITH PROPOFOL;  Surgeon: Eloise Harman, DO;  [facility];  Service: Endoscopy;  Laterality: N/A;  2:00pm   left shoulder arthroscopy  12/2020   SHOULDER ARTHROSCOPY WITH OPEN ROTATOR CUFF REPAIR AND DISTAL CLAVICLE ACROMINECTOMY Left 03/21/2022   Procedure: LEFT SHOULDER ARTHOSCOPY, DEBRIDEMENT,  MANIPULATION, ROTATOR CUFF REPAIR, ARTHROSCOPIC DISTAL CLAVICLE EXCISION;  Surgeon: Meredith Pel, MD;  Location: [place];  Service:  Orthopedics;  Laterality: Left;   TUBAL LIGATION     Patient Active Problem List   Diagnosis Date Noted   Arthritis of left acromioclavicular joint    Complete tear of left rotator cuff    Abdominal pain, epigastric 01/23/2022   Iron deficiency anemia 12/13/2021   Iron deficiency anemia due to chronic blood loss 12/13/2021   GERD (gastroesophageal reflux disease)    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 She reports having a previous shoulder surgery con 12/25/20 resulting in to change to shoulder condition. Pt was cleared to discontinue use of sling on 8/17 and was referred for an OT evaluation and treatment by Gloriann Loan, PA-C.   PRECAUTIONS: Shoulder; progress as tolerated  WEIGHT BEARING RESTRICTIONS Yes NWB  PAIN:  Are you having pain? Yes: NPRS scale: 3/10 Pain location: anterior and superior shoulder  Pain description: Stiff, achy, sore Aggravating factors: Sleeping  Relieving factors: Ice    OBJECTIVE:   ADLs: Overall ADLs: Pt requires assistance with dressing, bathing, hygiene/grooming, and meal prep due to surgical precautions.  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  Pt reports difficulty sleeping, as she prefers to sleep on her left side.    FUNCTIONAL OUTCOME MEASURES: FOTO: 49.51 05/28/22: 53/100 06/27/22: 53/100  UPPER EXTREMITY ROM     Assessed supine,  er/IR adducted  Passive ROM Left eval Left 05/07/22 Left 05/28/22 Left 06/27/22  Shoulder flexion 98 109 152 160  Shoulder abduction 94 96 150 155  Shoulder external rotation 40 45 51 65  (Blank rows = not tested)    Assessed seated, er/IR adducted    Active ROM Left eval Left 05/28/22 Left 06/27/22  Shoulder flexion  85 112  Shoulder abduction  69 90  Shoulder external rotation  40 45  (Blank rows = not tested)   UPPER EXTREMITY MMT:     Assessed seated, on observation, no formal MMT tested today  MMT Left eval Left  05/28/22 Left 06/28/22  Shoulder flexion  3-/5 3-/5  Shoulder abduction  3-/5 3-/5  Shoulder internal rotation  3/5 4/5  Shoulder external rotation  3/5 3/5  (Blank rows = not tested)  HAND FUNCTION: Grip strength: Right: 59 lbs; Left: 16 lbs  06/27/22: Right: 59; Left: 28   TODAY'S TREATMENT:  06/27/22 -A/ROM: seated, flexion, abduction, protraction, horizontal abduction, er/IR, 10 reps each; OT providing min 1-2 finger tactile cuing/assist to push to or past 90 degrees with flexion and abduction -Scapular theraband: red band-row, extension, 10 reps each   06/25/22 -Manual therapy: myofascial release to left upper arm, anterior shoulder, trapezius, and scapular regions to decrease pain and fascial restrictions and increase joint ROM -Ball Rolls: flexion, abduction, 1x10 -Wall slides: flexion, abduction, 1x10 -Stretches: posterior capsule stretch, doorway stretch, bicep stretch, 4x15" -AA/ROM: flexion, abduction, protraction, horizontal abduction, er/IR, 1x10, standing  06/21/22 -Manual therapy: myofascial release to left upper arm, anterior shoulder, trapezius, and scapular regions to decrease pain and fascial restrictions and increase joint ROM -AA/ROM: seated, flexion, abduction, protraction, horizontal abduction, er/IR, 1x10 -P/ROM: seated, flexion, abduction, er/IR, 1x5, 5 second hold in end range -A/ROM: seated, flexion, abduction, protraction,  horizontal abduction, er/IR, 1x8 -Scapular ROM: retraction, rows, extension, standing, 1x10    PATIENT EDUCATION: Education details: A/ROM Person educated: Patient Education method: Consulting civil engineer, Media planner, and Handouts Education comprehension: verbalized understanding and returned demonstration   HOME EXERCISE PROGRAM: Eval: Table slides 9/19: AA/ROM and Isometrics 10/12: shoulder stretches 10/27: scapula ROM/strengthening (provided yellow theraband) 11/2: A/ROM, upgraded to red band for scapular strengthening  GOALS: Goals reviewed with patient? Yes  SHORT TERM GOALS: Target date:  05/31/22     Pt will be provided with and educated on HEP to improve mobility in LUE required for ADL completion.   Goal status: MET  2.  Pt will decrease pain in LUE to 3/10 or less in order to sleep for 4+ consecutive hours without waking due to pain.   Goal status: IN PROGRESS  3.  Pt will increase P/ROM in LUE to [facility] to improve ability to perform dressing tasks with minimal compensatory techniques.   Goal status: MET  4.  Pt will increase strength in LUE to 3/5 to improve ability to perform lifting tasks required for meal preparation at waist height  Goal status: IN PROGRESS  LONG TERM GOALS: Target date:  06/28/22     Pt will decrease pain in LUE to 1/10 or less in order to return to work tasks.  Goal status: IN PROGRESS  2.  Pt will decrease LUE fascial restrictions to minimal amounts or less to improve mobility required for overhead reaching to retrieve clothing items from closet.   Goal status: IN PROGRESS  3.  Pt will increase A/ROM of LUE to [facility] to improve ability to reach overhead and behind back during dressing and bathing tasks.   Goal status: IN PROGRESS  4.  Pt will increase strength in LUE to 4+/5 to improve ability to perform lifting tasks required for work.   Goal status: IN PROGRESS   ASSESSMENT:  CLINICAL IMPRESSION: Reassessment completed this session,  pt is making slow progress towards her goals, demonstrating improved ROM and strength in all planes today. Pt with significant weakness at the 90 degree point during flexion and abduction, limited ability to push arm above this point. However, once past 90 degrees she is able to hold it and control descent. Continues to reports significant difficulty sleeping and lack of ROM and strength with functional tasks. OT notes compensatory leaning today, verbal cuing for sitting straight and engaging core during exercises. Discussed HEP with pt, instructed to complete A/ROM, scapular theraband, and stretches for warm-ups. Discussed need to focus on strengthening, beginning with using just her arm as weight. Pt verbalized understanding. Recommend continued skilled therapy services for 4 additional weeks to progress functional use of LUE.   PLAN: OT FREQUENCY: 2x/week  OT DURATION: 4 weeks  PLANNED INTERVENTIONS: self care/ADL training, therapeutic exercise, therapeutic activity, manual therapy, passive range of motion, moist heat, cryotherapy, patient/family education, energy conservation, coping strategies training, and DME and/or AE instructions   CONSULTED AND AGREED WITH PLAN OF CARE: Patient  PLAN FOR NEXT SESSION: follow up on HEP, continue with skilled OT services targeting LUE strength and stability required for functional task completion     Guadelupe Sabin, OTR/L  [phone] 06/27/2022, 1:54 PM

## 2022-06-27 NOTE — Patient Instructions (Signed)

## 2022-07-01 ENCOUNTER — Encounter: Payer: Self-pay | Admitting: Internal Medicine

## 2022-07-02 ENCOUNTER — Encounter (HOSPITAL_COMMUNITY): Payer: BC Managed Care – PPO | Admitting: Occupational Therapy

## 2022-07-05 ENCOUNTER — Encounter (HOSPITAL_COMMUNITY): Payer: BC Managed Care – PPO | Admitting: Occupational Therapy

## 2022-07-08 ENCOUNTER — Inpatient Hospital Stay (HOSPITAL_COMMUNITY): Payer: BC Managed Care – PPO

## 2022-07-10 ENCOUNTER — Encounter (HOSPITAL_COMMUNITY): Payer: BC Managed Care – PPO | Admitting: Occupational Therapy

## 2022-07-12 ENCOUNTER — Encounter (HOSPITAL_COMMUNITY): Payer: BC Managed Care – PPO | Admitting: Occupational Therapy

## 2022-07-15 ENCOUNTER — Inpatient Hospital Stay: Payer: BC Managed Care – PPO

## 2022-07-15 ENCOUNTER — Inpatient Hospital Stay: Payer: BC Managed Care – PPO | Admitting: Physician Assistant

## 2022-07-15 ENCOUNTER — Encounter (HOSPITAL_COMMUNITY): Payer: BC Managed Care – PPO | Admitting: Occupational Therapy

## 2022-07-17 ENCOUNTER — Encounter (HOSPITAL_COMMUNITY): Payer: BC Managed Care – PPO | Admitting: Occupational Therapy

## 2022-07-23 ENCOUNTER — Encounter (HOSPITAL_COMMUNITY): Payer: BC Managed Care – PPO | Admitting: Occupational Therapy

## 2022-07-24 ENCOUNTER — Telehealth: Payer: Self-pay | Admitting: Orthopedic Surgery

## 2022-07-24 NOTE — Telephone Encounter (Signed)
[employer] disability forms received. Please advise work status.

## 2022-07-25 ENCOUNTER — Encounter (HOSPITAL_COMMUNITY): Payer: BC Managed Care – PPO | Admitting: Occupational Therapy

## 2022-07-30 NOTE — Telephone Encounter (Signed)
Anticipate return to work after next visit which should be early December which is 6 weeks after her last visit for her knee.  Shoulder should be good to go at that point.

## 2022-07-31 ENCOUNTER — Ambulatory Visit: Payer: BC Managed Care – PPO | Admitting: Orthopedic Surgery

## 2022-07-31 DIAGNOSIS — M25461 Effusion, right knee: Secondary | ICD-10-CM | POA: Diagnosis not present

## 2022-07-31 DIAGNOSIS — G8929 Other chronic pain: Secondary | ICD-10-CM

## 2022-07-31 DIAGNOSIS — M25561 Pain in right knee: Secondary | ICD-10-CM

## 2022-08-01 NOTE — Telephone Encounter (Signed)
Noted for [employer] ?

## 2022-08-02 ENCOUNTER — Telehealth: Payer: Self-pay | Admitting: Orthopedic Surgery

## 2022-08-02 ENCOUNTER — Encounter: Payer: Self-pay | Admitting: Orthopedic Surgery

## 2022-08-02 LAB — HEPATIC FUNCTION PANEL
ALT: 87 U/L — AB (ref 7–35)
AST: 85 — AB (ref 13–35)
Alkaline Phosphatase: 125 (ref 25–125)

## 2022-08-02 LAB — BASIC METABOLIC PANEL
BUN: 10 (ref 4–21)
Chloride: 93 — AB (ref 99–108)
Creatinine: 0.9 (ref 0.5–1.1)
Glucose: 470

## 2022-08-02 LAB — COMPREHENSIVE METABOLIC PANEL
Albumin: 5.3 — AB (ref 3.5–5.0)
Calcium: 10.3 (ref 8.7–10.7)
eGFR: 73

## 2022-08-02 NOTE — Telephone Encounter (Signed)
Patient seen 12/6. Please advise work status for completion of forms. Thank you

## 2022-08-02 NOTE — Progress Notes (Unsigned)
Office Visit Note   Patient: Suzanne Lawrence           Date of Birth: Jan 10, 1971           MRN: [id number] Visit Date: 07/31/2022 Requested by: Practice, [facility] [place],  [place] [address] PCP: Pcp, No  Subjective: Chief Complaint  Patient presents with   Left Shoulder - Follow-up   Right Knee - Pain    HPI: Suzanne Lawrence is a 51 y.o. female who presents to the office reporting left shoulder pain following distal clavicle excision and shoulder surgery 03/21/2022.  She states she has noticed a lot of improvement since last office visit as well as prior to surgery.  She finished physical therapy and does home exercise program with bands and wrote.  She also reports right knee pain since a fall in October.  Describes swelling stiffness after sitting weakness giving way as well as pain that wakes her from sleep at night.  Really hurts her to lay on or apply pressure to the lateral side of that right knee.  No prior knee surgery.  Aleve and Tylenol do not give much relief..                ROS: All systems reviewed are negative as they relate to the chief complaint within the history of present illness.  Patient denies fevers or chills.  Assessment & Plan: Visit Diagnoses:  1. Chronic pain of right knee     Plan: Impression is left shoulder is doing well.  Continue with home exercise program for strengthening and range of motion.  Still has less than 90 degrees of active forward flexion.  Decision point today on the knee was for or against MRI scan versus injection.  I think with this persistent effusion and lateral joint line tenderness MRI of the right knee is indicated.  Aspiration and injection also performed.  Follow-up after that study.  Follow-Up Instructions: No follow-ups on file.   Orders:  Orders Placed This Encounter  Procedures   MR Knee Right w/o contrast   No orders of the defined types were placed in this encounter.     Procedures: Large Joint  Inj: R knee on 07/31/2022 7:33 AM Indications: diagnostic evaluation, joint swelling and pain Details: 18 G 1.5 in needle, superolateral approach  Arthrogram: No  Medications: 5 mL lidocaine 1 %; 40 mg methylPREDNISolone acetate 40 MG/ML; 4 mL bupivacaine 0.25 % Outcome: tolerated well, no immediate complications Procedure, treatment alternatives, risks and benefits explained, specific risks discussed. Consent was given by the patient. Immediately prior to procedure a time out was called to verify the correct patient, procedure, equipment, support staff and site/side marked as required. Patient was prepped and draped in the usual sterile fashion.       Clinical Data: No additional findings.  Objective: Vital Signs: There were no vitals taken for this visit.  Physical Exam:  Constitutional: Patient appears well-developed HEENT:  Head: Normocephalic Eyes:EOM are normal Neck: Normal range of motion Cardiovascular: Normal rate Pulmonary/chest: Effort normal Neurologic: Patient is alert Skin: Skin is warm Psychiatric: Patient has normal mood and affect  Ortho Exam: Ortho exam demonstrates mild effusion in that right knee.  Collateral crucial ligaments are stable.  Lateral joint line tenderness is present.  McMurray compression testing positive on the right negative on the left for lateral compartment pathology.  Extensor mechanism intact and nontender.  No groin pain with internal/external Tatian of the leg.  Left shoulder  has improving range of motion but still some weakness with forward flexion and abduction.  Specialty Comments:  No specialty comments available.  Imaging: No results found.   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 01/25/2020   High blood pressure    Depression    Carpal tunnel syndrome    Anxiety    Past Medical History:  Diagnosis Date   Abnormal Pap smear of cervix    Anemia    issue with red blood cells being small   Anxiety    Arthritis    Carpal tunnel syndrome    right   Depression    DVT (deep venous thrombosis) (HCC) 03/2021   left arm   GERD (gastroesophageal reflux disease)    High blood pressure    History of hiatal hernia    Left breast mass    Pre-diabetes     Family History  Problem Relation Age of Onset   Lung cancer Mother    Other Father        multiple issues, doesn't know history for sure   Prostate cancer Father    Stroke Father    Aneurysm Paternal Grandmother    Breast cancer Maternal Aunt    Prostate cancer Maternal Uncle    Alcoholism Maternal Uncle    Aneurysm Cousin        paternal side   Aneurysm Cousin        paternal side    Breast cancer Cousin        maternal side    High blood pressure Other        father's side    Cerebral aneurysm Other        cerebral, on father's side    Alcoholism Other        maternal side    Colon cancer Neg Hx     Past Surgical History:  Procedure Laterality Date   BIOPSY  02/05/2022   Procedure: BIOPSY;  Surgeon: Lanelle Bal, DO;  Location: [facility];  Service: Endoscopy;;   COLONOSCOPY  2022   Megan Straughan: 7 polyps removed, has to return in one year   COLONOSCOPY  09/2021   Megan Straughan: 12 polyps removed   ESOPHAGOGASTRODUODENOSCOPY (EGD) WITH PROPOFOL N/A 02/05/2022   Procedure: ESOPHAGOGASTRODUODENOSCOPY (EGD) WITH PROPOFOL;  Surgeon: Lanelle Bal, DO;  Location: [facility];  Service: Endoscopy;  Laterality: N/A;  2:00pm   left shoulder arthroscopy  12/2020   SHOULDER ARTHROSCOPY WITH OPEN ROTATOR CUFF REPAIR AND DISTAL CLAVICLE ACROMINECTOMY Left 03/21/2022   Procedure: LEFT SHOULDER  ARTHOSCOPY, DEBRIDEMENT,  MANIPULATION, ROTATOR CUFF REPAIR, ARTHROSCOPIC DISTAL CLAVICLE EXCISION;  Surgeon: Cammy Copa, MD;  Location: [facility] OR;  Service: Orthopedics;  Laterality: Left;   TUBAL LIGATION     Social History   Occupational History   Not on file  Tobacco Use   Smoking status: Every Day    Packs/day: 0.50    Types: Cigarettes   Smokeless tobacco: Never  Vaping Use   Vaping Use: Never used  Substance and Sexual Activity   Alcohol use: Not Currently   Drug use: Not Currently  Sexual activity: Yes    Birth control/protection: None

## 2022-08-03 ENCOUNTER — Encounter: Payer: Self-pay | Admitting: Orthopedic Surgery

## 2022-08-03 MED ORDER — BUPIVACAINE HCL 0.25 % IJ SOLN
4.0000 mL | INTRAMUSCULAR | Status: AC | PRN
  Administered 2022-07-31: 4 mL via INTRA_ARTICULAR

## 2022-08-03 MED ORDER — LIDOCAINE HCL 1 % IJ SOLN
5.0000 mL | INTRAMUSCULAR | Status: AC | PRN
  Administered 2022-07-31: 5 mL

## 2022-08-03 MED ORDER — METHYLPREDNISOLONE ACETATE 40 MG/ML IJ SUSP
40.0000 mg | INTRAMUSCULAR | Status: AC | PRN
  Administered 2022-07-31: 40 mg via INTRA_ARTICULAR

## 2022-08-03 NOTE — Telephone Encounter (Signed)
She has a knee problem also.  I would say out of work pending results from the MRI

## 2022-08-05 NOTE — Telephone Encounter (Signed)
Note printed for [employer] to complete forms

## 2022-08-06 LAB — BASIC METABOLIC PANEL
BUN: 6 (ref 4–21)
Chloride: 91 — AB (ref 99–108)
Creatinine: 0.8 (ref 0.5–1.1)
Glucose: 422

## 2022-08-06 LAB — HEPATIC FUNCTION PANEL
ALT: 63 U/L — AB (ref 7–35)
AST: 67 — AB (ref 13–35)

## 2022-08-06 LAB — COMPREHENSIVE METABOLIC PANEL: eGFR: 91

## 2022-08-06 NOTE — Progress Notes (Unsigned)
Virtual Visit via Telephone Note [facility]  I connected with Suzanne Lawrence  on 08/07/22 at  3:19 PM by telephone and verified that I am speaking with the correct person using two identifiers.  Location: Patient: Home Provider: Home office   I discussed the limitations, risks, security and privacy concerns of performing an evaluation and management service by telephone and the availability of in person appointments. I also discussed with the patient that there may be a patient responsible charge related to this service. The patient expressed understanding and agreed to proceed.  REASON FOR VISIT: Iron deficiency anemia  PRIOR THERAPY: Iron tablets  CURRENT THERAPY: Intermittent IV iron (last on 12/18/2021 and 12/26/2021)  INTERVAL HISTORY: Ms. Suzanne Lawrence is contacted today for follow-up of her iron deficiency anemia.  She was last seen on 03/15/2022 by Rojelio Brenner PA-C.  At today's visit, she reports feeling fair.  She denies any recent hospitalizations, surgeries, or changes in her baseline health status.  She has not had any major menstrual bleeding since uterine ablation in March 2023, but had one episode "minor vaginal bleeding" in summer 2023.  No rectal bleeding or melena; she has dark green bowel movements from time to time.  She still has some residual fatigue with energy about 70%, which she attributes to her uncontrolled diabetes and very high glucose.   She has some chronic dyspnea on exertion from "being out of shape."  She reports dizziness/lightheadedness from vertigo, as well as some palpitations.  She denies any headaches, chest pain, or syncope.  She reports that her primary care doctor stopped her iron supplements in October 2023.  She reports low appetite at about 50%, reports that she has been losing weight over the past month; has gone from 183 pounds down to 166 pounds.   OBSERVATIONS/OBJECTIVE: Review of Systems  Constitutional:   Positive for malaise/fatigue and weight loss. Negative for chills, diaphoresis and fever.  Respiratory:  Positive for shortness of breath. Negative for cough.   Cardiovascular:  Negative for chest pain and palpitations.  Gastrointestinal:  Positive for nausea and vomiting. Negative for abdominal pain, blood in stool and melena.  Musculoskeletal:  Positive for joint pain.  Neurological:  Positive for dizziness and headaches.     PHYSICAL EXAM (per limitations of virtual telephone visit): The patient is alert and oriented x 3, exhibiting adequate mentation, good mood, and ability to speak in full sentences and execute sound judgement.   ASSESSMENT & PLAN: 1.  Iron deficiency anemia: - She has been on iron tablet daily since January of this year and is tolerating well, but with only very minimal improvement in Hgb - Labs on 11/13/2021 with hemoglobin 9.5, MCV 65.  Ferritin was 5.6. - She has menorrhagia, status post endometrial ablation in March 2023.  Since uterine ablation, she has had "mini menstrual cycles," with light bleeding about 5 days each month. - She denies bright red blood per rectum or melena. - Colonoscopy (10/03/2021): Removal of 2 sessile polyps in the descending colon, 2 sessile polyps in the sigmoid colon, 4 sessile polyps in the sigmoid colon, 3 sessile polyps in the rectosigmoid colon.  Pathology report shows all removed polyps to be hyperplastic with no dysplasia or malignancy. - EGD (02/05/2022): Small hiatal hernia, gastritis, gastric polyps x3 - She received IV Feraheme x2 (12/18/2021 and 12/26/2021).  She had mild INFUSION REACTION to first dose of Feraheme, was able to complete after receiving IV steroids.  Tolerated her second IV Feraheme  infusion well with premedications. - Most recent labs (08/07/2022): Hgb 16.0/HCT 47.8, ferritin 167, iron saturation 25% - Iron supplement was stopped in October 2023 by her PCP - PLAN: No indication for IV iron at this time.  Her iron  deficiency anemia appears to have resolved after treatment of her menorrhagia.  Will continue monitoring over the next year, but if stable would be elevated for discharge from clinic.   2.  Microcytosis - Persistent microcytosis and elevated RBC, regardless of hemoglobin or iron levels. - Hemoglobin fractionation cascade was normal (03/15/2022), although this does not exclude alpha thalassemia trait.  Copper levels were normal. - PLAN: Will check alpha thalassemia DNA.  Office visit in 1 month to discuss results.  3.  Erythrocytosis - Elevated hemoglobin since July 2023 with Hgb 15.3/HCT 47.4 (03/08/2022) and Hgb 16.0/HCT 47.8 (08/07/2022) - Patient smokes <0.5 PPD cigarettes - No known history of sleep apnea, but she has been told that she snores. - No obvious carbon monoxide exposure, but she has gas heat at home, along with CO monitor. - She may have some diuretic effect from her chlorthalidone which could be causing hemoconcentration.  Additionally, she has had severe hyperglycemia for the past several months (blood sugars in the 400s), which could also be causing some diuretic effect. - PLAN: Unclear etiology of erythrocytosis at this time, although differential diagnosis includes reactive erythrocytosis from smoking, undiagnosed sleep apnea, hemoconcentration from diuresis, or developing MPN. - We will check erythropoietin, JAK2 with reflex, and carbon monoxide levels.  Office visit in 1 months to discuss results.  4.  Weight loss - Patient reports decreased appetite and weight loss over the past month, has dropped from 183 pounds to 166 pounds - Also reports intermittent nausea and vomiting and that "food does not taste the same" - She is following closely with her PCP for management of her severe hyperglycemia, with blood sugars frequently >400 - PLAN: Recommend continued close follow-up with PCP for diabetic control.  Recommend focusing on adequate and nutritional caloric intake at  home. - We will check weight at follow-up visit in 1 month.  5.  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  She had exposure to chemicals including heptane and toluene. - She is current active smoker, 1 pack every 2 to 3 days for 25 years.  (Does not qualify for LCS/LDCT chest due to high pack-year history <20) - No family history of severe anemia or sickle cell disease.  Mother had lung cancer.  Father had prostate and colon cancer.  Maternal aunt had breast cancer.  Maternal and paternal cousins had breast cancer.  Maternal uncle had colon cancer.    PLAN SUMMARY: >> Labs within the next 1 to 2 weeks (alpha thalassemia genotype, erythropoietin, JAK2 with reflex, carbon monoxide, CBC/D) >> Office visit in 1 month    I discussed the assessment and treatment plan with the patient. The patient was provided an opportunity to ask questions and all were answered. The patient agreed with the plan and demonstrated an understanding of the instructions.   The patient was advised to call back or seek an in-person evaluation if the symptoms worsen or if the condition fails to improve as anticipated.  I provided 22 minutes of non-face-to-face time during this encounter.   Carnella Guadalajara, PA-C 08/07/2022 3:52 PM

## 2022-08-07 ENCOUNTER — Inpatient Hospital Stay: Payer: BC Managed Care – PPO | Attending: Hematology | Admitting: Physician Assistant

## 2022-08-07 ENCOUNTER — Inpatient Hospital Stay: Payer: BC Managed Care – PPO

## 2022-08-07 ENCOUNTER — Encounter: Payer: Self-pay | Admitting: Physician Assistant

## 2022-08-07 DIAGNOSIS — R718 Other abnormality of red blood cells: Secondary | ICD-10-CM

## 2022-08-07 DIAGNOSIS — F17210 Nicotine dependence, cigarettes, uncomplicated: Secondary | ICD-10-CM | POA: Diagnosis not present

## 2022-08-07 DIAGNOSIS — D500 Iron deficiency anemia secondary to blood loss (chronic): Secondary | ICD-10-CM | POA: Diagnosis not present

## 2022-08-07 DIAGNOSIS — R634 Abnormal weight loss: Secondary | ICD-10-CM

## 2022-08-07 DIAGNOSIS — D751 Secondary polycythemia: Secondary | ICD-10-CM | POA: Diagnosis not present

## 2022-08-07 DIAGNOSIS — D509 Iron deficiency anemia, unspecified: Secondary | ICD-10-CM | POA: Diagnosis present

## 2022-08-07 DIAGNOSIS — R739 Hyperglycemia, unspecified: Secondary | ICD-10-CM | POA: Diagnosis not present

## 2022-08-07 LAB — CBC WITH DIFFERENTIAL/PLATELET
Abs Immature Granulocytes: 0.03 10*3/uL (ref 0.00–0.07)
Basophils Absolute: 0.1 10*3/uL (ref 0.0–0.1)
Basophils Relative: 1 %
Eosinophils Absolute: 0.1 10*3/uL (ref 0.0–0.5)
Eosinophils Relative: 1 %
HCT: 47.8 % — ABNORMAL HIGH (ref 36.0–46.0)
Hemoglobin: 16 g/dL — ABNORMAL HIGH (ref 12.0–15.0)
Immature Granulocytes: 0 %
Lymphocytes Relative: 27 %
Lymphs Abs: 2 10*3/uL (ref 0.7–4.0)
MCH: 26.6 pg (ref 26.0–34.0)
MCHC: 33.5 g/dL (ref 30.0–36.0)
MCV: 79.5 fL — ABNORMAL LOW (ref 80.0–100.0)
Monocytes Absolute: 0.5 10*3/uL (ref 0.1–1.0)
Monocytes Relative: 7 %
Neutro Abs: 4.8 10*3/uL (ref 1.7–7.7)
Neutrophils Relative %: 64 %
Platelets: 307 10*3/uL (ref 150–400)
RBC: 6.01 MIL/uL — ABNORMAL HIGH (ref 3.87–5.11)
RDW: 12.7 % (ref 11.5–15.5)
WBC: 7.5 10*3/uL (ref 4.0–10.5)
nRBC: 0 % (ref 0.0–0.2)

## 2022-08-07 LAB — IRON AND TIBC
Iron: 103 ug/dL (ref 28–170)
Saturation Ratios: 25 % (ref 10.4–31.8)
TIBC: 417 ug/dL (ref 250–450)
UIBC: 314 ug/dL

## 2022-08-07 LAB — FERRITIN: Ferritin: 167 ng/mL (ref 11–307)

## 2022-08-08 ENCOUNTER — Other Ambulatory Visit: Payer: Self-pay

## 2022-08-08 ENCOUNTER — Telehealth: Payer: Self-pay | Admitting: Internal Medicine

## 2022-08-08 DIAGNOSIS — D500 Iron deficiency anemia secondary to blood loss (chronic): Secondary | ICD-10-CM

## 2022-08-08 DIAGNOSIS — R718 Other abnormality of red blood cells: Secondary | ICD-10-CM

## 2022-08-08 DIAGNOSIS — D751 Secondary polycythemia: Secondary | ICD-10-CM

## 2022-08-08 NOTE — Telephone Encounter (Signed)
She wasn't seen for diarrhea so she can be put on the schedule whenever there is an opening for those symptoms.

## 2022-08-08 NOTE — Telephone Encounter (Signed)
Patient called and said she saw her dr and was telling him about her appt in Feb and what was going on with her.  She said that she has diarrhea really bad and in her stool it looks like "capsules or polyps" coming out.  Not every time she goes but more often than not.  She said her dr advised her to call our office and see if she could get in before February.  I told her that I would pass this information along to you.  Please advise.

## 2022-08-09 ENCOUNTER — Emergency Department (HOSPITAL_COMMUNITY)
Admission: EM | Admit: 2022-08-09 | Discharge: 2022-08-09 | Disposition: A | Discharge status: Home / Self Care | Payer: BC Managed Care – PPO | Attending: Emergency Medicine | Admitting: Emergency Medicine

## 2022-08-09 ENCOUNTER — Encounter (HOSPITAL_COMMUNITY): Payer: Self-pay | Admitting: *Deleted

## 2022-08-09 ENCOUNTER — Other Ambulatory Visit: Payer: Self-pay

## 2022-08-09 DIAGNOSIS — R739 Hyperglycemia, unspecified: Secondary | ICD-10-CM | POA: Insufficient documentation

## 2022-08-09 DIAGNOSIS — Z794 Long term (current) use of insulin: Secondary | ICD-10-CM | POA: Insufficient documentation

## 2022-08-09 HISTORY — DX: Type 2 diabetes mellitus without complications: E11.9

## 2022-08-09 LAB — BASIC METABOLIC PANEL
Anion gap: 13 (ref 5–15)
BUN: 9 mg/dL (ref 6–20)
CO2: 24 mmol/L (ref 22–32)
Calcium: 9.3 mg/dL (ref 8.9–10.3)
Chloride: 95 mmol/L — ABNORMAL LOW (ref 98–111)
Creatinine, Ser: 0.92 mg/dL (ref 0.44–1.00)
GFR, Estimated: >60 mL/min (ref >60)
Glucose, Bld: 625 mg/dL (ref 70–99)
Potassium: 3.9 mmol/L (ref 3.5–5.1)
Sodium: 132 mmol/L — ABNORMAL LOW (ref 135–145)

## 2022-08-09 LAB — URINALYSIS, ROUTINE W REFLEX MICROSCOPIC
Bacteria, UA: NONE SEEN
Bilirubin Urine: NEGATIVE
Glucose, UA: >=500 mg/dL — AB
Hgb urine dipstick: NEGATIVE
Ketones, ur: 20 mg/dL — AB
Leukocytes,Ua: NEGATIVE
Nitrite: NEGATIVE
Protein, ur: NEGATIVE mg/dL
Specific Gravity, Urine: 1.036 — ABNORMAL HIGH (ref 1.005–1.030)
pH: 6 (ref 5.0–8.0)

## 2022-08-09 LAB — CBC
HCT: 47.7 % — ABNORMAL HIGH (ref 36.0–46.0)
Hemoglobin: 16 g/dL — ABNORMAL HIGH (ref 12.0–15.0)
MCH: 26.7 pg (ref 26.0–34.0)
MCHC: 33.5 g/dL (ref 30.0–36.0)
MCV: 79.5 fL — ABNORMAL LOW (ref 80.0–100.0)
Platelets: 297 10*3/uL (ref 150–400)
RBC: 6 MIL/uL — ABNORMAL HIGH (ref 3.87–5.11)
RDW: 12.7 % (ref 11.5–15.5)
WBC: 8.2 10*3/uL (ref 4.0–10.5)
nRBC: 0 % (ref 0.0–0.2)

## 2022-08-09 LAB — CBG MONITORING, ED
Glucose-Capillary: >600 mg/dL (ref 70–99)
Glucose-Capillary: 441 mg/dL — ABNORMAL HIGH (ref 70–99)
Glucose-Capillary: 272 mg/dL — ABNORMAL HIGH (ref 70–99)
Glucose-Capillary: 200 mg/dL — ABNORMAL HIGH (ref 70–99)

## 2022-08-09 MED ORDER — INSULIN ASPART 100 UNIT/ML IJ SOLN
12.0000 [IU] | Freq: Once | INTRAMUSCULAR | Status: AC
  Administered 2022-08-09: 12 [IU] via SUBCUTANEOUS
  Filled 2022-08-09: qty 1

## 2022-08-09 MED ORDER — SODIUM CHLORIDE 0.9 % IV BOLUS (SEPSIS)
1000.0000 mL | Freq: Once | INTRAVENOUS | Status: DC
  Administered 2022-08-09: 1000 mL via INTRAVENOUS

## 2022-08-09 MED ORDER — SODIUM CHLORIDE 0.9 % IV BOLUS
2000.0000 mL | Freq: Once | INTRAVENOUS | Status: AC
  Administered 2022-08-09: 2000 mL via INTRAVENOUS

## 2022-08-09 MED ORDER — SODIUM CHLORIDE 0.9 % IV SOLN: 1000.0000 mL | INTRAVENOUS | Status: DC

## 2022-08-09 NOTE — Discharge Instructions (Signed)
Take 10 units of insulin tonight after you eat.  Resume 20 units tomorrow

## 2022-08-09 NOTE — ED Triage Notes (Signed)
Pt with elevated blood sugars all week.  Over 600 today at home.  Read HI in triage as well. Pt c/o HA an blurred vision. + dry mouth

## 2022-08-09 NOTE — ED Provider Triage Note (Signed)
Emergency Medicine Provider Triage Evaluation Note  Suzanne Lawrence , a 51 y.o. female  was evaluated in triage.  Pt complains of elevated glucose and feeling bad.  Review of Systems  Positive: weakness Negative: Fever or chills  Physical Exam  BP (!) 155/84 (BP Location: Right Arm)   Pulse 84   Temp 98.1 F (36.7 C) (Oral)   Resp 20   Ht 5\' 1"  (1.549 m)   Wt 74.4 kg   SpO2 100%   BMI 30.99 kg/m  Gen:   Awake, no distress   Resp:  Normal effort  MSK:   Moves extremities without difficulty  Other:    Medical Decision Making  Medically screening exam initiated at 1:16 PM.  Appropriate orders placed.  Suzanne Lawrence was informed that the remainder of the evaluation will be completed by another provider, this initial triage assessment does not replace that evaluation, and the importance of remaining in the ED until their evaluation is complete.     [name], Elson Areas 08/09/22 1333

## 2022-08-10 NOTE — ED Provider Notes (Signed)
[facility] EMERGENCY DEPARTMENT Provider Note   CSN: [id number] Arrival date & time: 08/09/22  1232     History  Chief Complaint  Patient presents with   Hyperglycemia    Suzanne Lawrence is a 51 y.o. female.  Patient reports she saw her primary care physician today and had an ultrasound of her liver.  Patient reports she was told that her glucose is elevated and that she should come to the emergency department for medications to get her glucose down.  Patient reports her monitor was reading over 600.  Patient reports she was recently started on insulin.  Patient reports she has supposed to take 20 units at 5:30 PM.  Patient is not on a diabetic diet.  The history is provided by the patient. No language interpreter was used.  Hyperglycemia Blood sugar level PTA:  Over 600 Severity:  Severe Onset quality:  Gradual Chronicity:  New Diabetes status:  Controlled with insulin Time since last antidiabetic medication:  1 day Relieved by:  Nothing Ineffective treatments:  Insulin Associated symptoms: no fever and no vomiting   Risk factors: no family hx of diabetes        Home Medications Prior to Admission medications   Medication Sig Start Date End Date Taking? Authorizing Provider  acetaminophen (TYLENOL) 325 MG tablet Take 650 mg by mouth every 6 (six) hours as needed for moderate pain.    [provider]  baclofen (LIORESAL) 10 MG tablet Take 10 mg by mouth 3 (three) times daily as needed (Back pain).    [provider]  chlorthalidone (HYGROTON) 25 MG tablet Take 25 mg by mouth every morning. 10/31/19   [provider]  cholecalciferol (VITAMIN D) 25 MCG (1000 UNIT) tablet Take 1,000 Units by mouth daily.    [provider]  DULoxetine (CYMBALTA) 60 MG capsule Take 60 mg by mouth daily in the afternoon. 02/15/21   [provider]  famotidine (PEPCID) 40 MG tablet Take 40 mg by mouth daily. 03/30/21   [provider]  Ferrous  Gluconate-C-Folic Acid (IRON-C PO) Take 60 mg by mouth daily.    [provider]  gabapentin (NEURONTIN) 300 MG capsule Take 300 mg by mouth at bedtime. 03/26/21   [provider]  meclizine (ANTIVERT) 25 MG tablet Take 25 mg by mouth 3 (three) times daily as needed for dizziness.    [provider]  omeprazole (PRILOSEC) 40 MG capsule Take 40 mg by mouth daily.    [provider]  sucralfate (CARAFATE) 1 g tablet Take 1 g by mouth daily as needed (Acid reflux). 12/06/21   [provider]  traZODone (DESYREL) 50 MG tablet Take 50 mg by mouth at bedtime as needed for sleep. 12/07/18 08/07/22  [provider]      Allergies    Ferumoxytol and Hydrochlorothiazide    Review of Systems   Review of Systems  Constitutional:  Negative for fever.  HENT:  Negative for sore throat.   Eyes:  Positive for visual disturbance.  Gastrointestinal:  Negative for vomiting.  Skin:  Negative for color change and rash.  All other systems reviewed and are negative.   Physical Exam Updated Vital Signs BP (!) 166/79   Pulse 71   Temp 98.2 F (36.8 C) (Oral)   Resp 17   Ht 5\' 1"  (1.549 m)   Wt 74.4 kg   SpO2 100%   BMI 30.99 kg/m  Physical Exam Vitals and nursing note reviewed.  Constitutional:  Appearance: She is well-developed.  HENT:     Head: Normocephalic.     Mouth/Throat:     Mouth: Mucous membranes are moist.  Eyes:     Pupils: Pupils are equal, round, and reactive to light.  Cardiovascular:     Rate and Rhythm: Normal rate.  Pulmonary:     Effort: Pulmonary effort is normal.  Abdominal:     General: Abdomen is flat. There is no distension.  Musculoskeletal:        General: Normal range of motion.     Cervical back: Normal range of motion.  Skin:    General: Skin is warm.  Neurological:     General: No focal deficit present.     Mental Status: She is alert and oriented to person, place, and time.     ED Results /  Procedures / Treatments   Labs (all labs ordered are listed, but only abnormal results are displayed) Labs Reviewed  BASIC METABOLIC PANEL - Abnormal; Notable for the following components:      Result Value   Sodium 132 (*)    Chloride 95 (*)    Glucose, Bld 625 (*)    All other components within normal limits  CBC - Abnormal; Notable for the following components:   RBC 6.00 (*)    Hemoglobin 16.0 (*)    HCT 47.7 (*)    MCV 79.5 (*)    All other components within normal limits  URINALYSIS, ROUTINE W REFLEX MICROSCOPIC - Abnormal; Notable for the following components:   Specific Gravity, Urine 1.036 (*)    Glucose, UA >=500 (*)    Ketones, ur 20 (*)    All other components within normal limits  CBG MONITORING, ED - Abnormal; Notable for the following components:   Glucose-Capillary >600 (*)    All other components within normal limits  CBG MONITORING, ED - Abnormal; Notable for the following components:   Glucose-Capillary 441 (*)    All other components within normal limits  CBG MONITORING, ED - Abnormal; Notable for the following components:   Glucose-Capillary 272 (*)    All other components within normal limits  CBG MONITORING, ED - Abnormal; Notable for the following components:   Glucose-Capillary 200 (*)    All other components within normal limits    EKG None  Radiology No results found.  Procedures Procedures    Medications Ordered in ED Medications  insulin aspart (novoLOG) injection 12 Units (12 Units Subcutaneous Given 08/09/22 1432)  sodium chloride 0.9 % bolus 2,000 mL (0 mLs Intravenous Stopped 08/09/22 1838)    ED Course/ Medical Decision Making/ A&P                           Medical Decision Making Patient complains of elevated glucose.  Patient was sent here by her primary care physician to get treatment to decrease her glucose level  Amount and/or Complexity of Data Reviewed External Data Reviewed: labs.    Details: Glucose over 600 Labs:  ordered. Decision-making details documented in ED Course.    Details: Labs ordered reviewed and interpreted.  Patient has a glucose of 625.  Patient does not have an anion gap.  His hemoglobin is elevated at 16 patient is given IV fluids x 2 L she is given 12 units of insulin subcu.  Patient is observed with recheck CBGs patient's glucose decreased to 441 with IV fluids.  Patient's glucose decreased to 227 after subcu insulin.  Patient continued to be monitored patient's glucose decreased to 200.  Risk Prescription drug management. Risk Details: Patient counseled on diabetes management.  Patient is advised to avoid juices and sodas with sugar.  Patient is counseled on healthy eating.  Patient is advised that she should give herself 10 units of insulin tonight before bed she should resume 20 units of insulin at 530 tomorrow.  Patient is advised to follow-up with her primary care physician for recheck.  Patient is advised she needs to follow-up for diabetic education.  Patient feels much better patient is stable for discharge  Labs ordered reviewed and interpreted         Final Clinical Impression(s) / ED Diagnoses Final diagnoses:  Hyperglycemia    Rx / DC Orders ED Discharge Orders     None      An After Visit Summary was printed and given to the patient.    Osie Cheeks 08/10/22 2207    Bethann Berkshire, MD 08/22/22 [phone]

## 2022-08-13 ENCOUNTER — Telehealth: Payer: Self-pay | Admitting: Orthopedic Surgery

## 2022-08-13 NOTE — Telephone Encounter (Signed)
[employer] received call stating forms completed wrong as they were completed for the knee. Patient states her disability is for her shoulder. Please update oow note for shoulder if approved. Thank you. Once received, [employer] will update forms for the shoulder.

## 2022-08-13 NOTE — Telephone Encounter (Signed)
Pt called asking for a call back from Lauren F. Pt stat [employer] informed pt to call Lauren F for her disability forms was not filled out correctly. So info was wrong. Pt phone number is [phone].

## 2022-08-13 NOTE — Telephone Encounter (Signed)
[employer] advised, will contact patient and advise.

## 2022-08-15 NOTE — Telephone Encounter (Signed)
Ok for 4 weeks oow for shoulder to improve forward flexion - at 6 mos post op she will have reached mmi for shoulder

## 2022-08-15 NOTE — Telephone Encounter (Signed)
Please advise.  I called and talked with patient. She had shoulder surgery back in July and is apparently still out of work.  She recently brought in some disability forms that were filled out for her knee based off of work notes that are in the patients chart.  Patient stating this is not correct and that she is still on leave for he shoulder b/c she has not reached full ROM.  There are no work notes in the chart at all regarding patients shoulder.  I advised that 5 months is a long time to be out of work for shoulder but would need to get with you to see if you were willing to continue her disability leave due to shoulder.

## 2022-08-16 ENCOUNTER — Encounter (HOSPITAL_COMMUNITY): Payer: Self-pay | Admitting: Occupational Therapy

## 2022-08-16 NOTE — Therapy (Signed)
[place], [place], [address] Phone: [phone]   Fax:  [phone]  Patient Details  Name: Suzanne Lawrence MRN: [id number] Date of Birth: 02-21-1971 Referring Provider:  No ref. provider found  Encounter Date: 08/16/2022  OCCUPATIONAL THERAPY DISCHARGE SUMMARY  Visits from Start of Care: 14  Current functional level related to goals / functional outcomes: Unknown. Pt has not been seen since last visit on 06/27/22. Pt cancelled all remaining appointments due to financial issues. On 06/27/22: Reassessment completed, pt is making slow progress towards her goals, demonstrating improved ROM and strength in all planes, had met 2/4 STGs on this date.    Remaining deficits: Pt with significant weakness at the 90 degree point during flexion and abduction, limited ability to push arm above this point. However, once past 90 degrees she is able to hold it and control descent. Continues to reports significant difficulty sleeping and lack of ROM and strength with functional tasks. OT notes compensatory leaning today, verbal cuing for sitting straight and engaging core during exercises.    Education / Equipment: On 06/27/22: Discussed HEP with pt, instructed to complete A/ROM, scapular theraband, and stretches for warm-ups.    Patient agrees to discharge. Patient goals were partially met. Patient is being discharged due to financial reasons.Guadelupe Sabin, OTR/L  [phone] 08/16/2022, 9:58 PM  [facility] [address] [place], [place], [address] Phone: [phone]   Fax:  [phone]

## 2022-08-16 NOTE — Telephone Encounter (Signed)
sure

## 2022-08-21 ENCOUNTER — Encounter: Payer: BC Managed Care – PPO | Attending: Internal Medicine | Admitting: Nutrition

## 2022-08-21 ENCOUNTER — Inpatient Hospital Stay: Payer: BC Managed Care – PPO

## 2022-08-21 VITALS — Ht 61.0 in | Wt 173.0 lb

## 2022-08-21 DIAGNOSIS — R718 Other abnormality of red blood cells: Secondary | ICD-10-CM

## 2022-08-21 DIAGNOSIS — D751 Secondary polycythemia: Secondary | ICD-10-CM

## 2022-08-21 DIAGNOSIS — E118 Type 2 diabetes mellitus with unspecified complications: Secondary | ICD-10-CM | POA: Insufficient documentation

## 2022-08-21 DIAGNOSIS — F321 Major depressive disorder, single episode, moderate: Secondary | ICD-10-CM | POA: Insufficient documentation

## 2022-08-21 DIAGNOSIS — I10 Essential (primary) hypertension: Secondary | ICD-10-CM | POA: Diagnosis present

## 2022-08-21 DIAGNOSIS — D509 Iron deficiency anemia, unspecified: Secondary | ICD-10-CM | POA: Diagnosis not present

## 2022-08-21 DIAGNOSIS — D500 Iron deficiency anemia secondary to blood loss (chronic): Secondary | ICD-10-CM

## 2022-08-21 LAB — CBC WITH DIFFERENTIAL/PLATELET
Abs Immature Granulocytes: 0.03 10*3/uL (ref 0.00–0.07)
Basophils Absolute: 0.1 10*3/uL (ref 0.0–0.1)
Basophils Relative: 1 %
Eosinophils Absolute: 0.1 10*3/uL (ref 0.0–0.5)
Eosinophils Relative: 1 %
HCT: 45.3 % (ref 36.0–46.0)
Hemoglobin: 14.9 g/dL (ref 12.0–15.0)
Immature Granulocytes: 0 %
Lymphocytes Relative: 35 %
Lymphs Abs: 3.1 10*3/uL (ref 0.7–4.0)
MCH: 26.5 pg (ref 26.0–34.0)
MCHC: 32.9 g/dL (ref 30.0–36.0)
MCV: 80.6 fL (ref 80.0–100.0)
Monocytes Absolute: 0.6 10*3/uL (ref 0.1–1.0)
Monocytes Relative: 7 %
Neutro Abs: 5 10*3/uL (ref 1.7–7.7)
Neutrophils Relative %: 56 %
Platelets: 300 10*3/uL (ref 150–400)
RBC: 5.62 MIL/uL — ABNORMAL HIGH (ref 3.87–5.11)
RDW: 13.9 % (ref 11.5–15.5)
WBC: 8.9 10*3/uL (ref 4.0–10.5)
nRBC: 0 % (ref 0.0–0.2)

## 2022-08-21 NOTE — Patient Instructions (Addendum)
Goals  Eat three meals per day at meals times discussed -based on whole plant based foods Work on cutting down on smoking to 5 cigarrettes a day Test blood sugars before meals and not after meals Use sliding scale insulin schedule. Walking 3 times per week for 30 minutes. Contact Dr. Mayford Knife about referral for counseling. Don't skip meals.

## 2022-08-21 NOTE — Progress Notes (Signed)
Medical Nutrition Therapy  Appointment Start time:  1530  Appointment End time:  1530  Primary concerns today: Dm Type 2, Obesity  Referral diagnosis: E11.8, E66.01 Preferred learning style: No preference Learning readiness: Contemplating   NUTRITION ASSESSMENT  51 yr old bfemale here with Type 2 DM. She notes she was recently started on insulin. To see Dr. Fransico Him, Endocrinology in near future. PCP is Dr. Mitzi Hansen. 2 recent episodes in ER due to hyperglycemia.DM  Type 2 diagnosed in the last year. Was on metformin and Atoravastin  but was stopped due elevated liver enzymes. Abilify was reduced to 5 mg also. Never started on Jardiance. Going to see GI in near future. FBS:420 mg/dl the other day.  Was Lantus 60 units and is dropping down to 30 units tonight due to having lower blood sugars per Dr Mayford Knife.. Had FBS 80 this am. Is on Lispro 3 units with meals. Is in contact with Dr. Mayford Knife daily to adjust medications.  Testing 4 times per day but some readings are before meals or after meals.. BS have been in the 200-400's in the last week. Has been skipping some meals. Has been testing after meals sometimes instead of before. Is not on a sliding scale insulin schedule.   Is currently long term disability for shoulder injury- [employer] Working on cutting down on smoking. Smoking down to 8 cigarrettes a day.  Wants to lose weight and feel better. Is willing to work with [facility]. Needs mental health referral to help with her depression which is interferring with her ability to do self care and manage her chronic health issues.  Depression. Is on Cymbalta but notes it is not helping. Has a lot of loss in her life and lost her son a few years ago due to suicide. She would like to get some help with behavior health.  Current diet is insuffient to meet her needs nutritionally and medically. She is wiling to work on improving her nutrition and lifestyle choices to  improve her diabetes and chronic medical conditions.   Anthropometrics  Wt Readings from Last 3 Encounters:  08/21/22 173 lb (78.5 kg)  08/09/22 164 lb (74.4 kg)  03/21/22 180 lb (81.6 kg)   Ht Readings from Last 3 Encounters:  08/21/22 5\' 1"  (1.549 m)  08/09/22 5\' 1"  (1.549 m)  03/21/22 5\' 1"  (1.549 m)   Body mass index is 32.69 kg/m. @BMIFA @ Facility age limit for growth %iles is 20 years. Facility age limit for growth %iles is 20 years.    Clinical Medical Hx: Depression, Obesity, Type 2 DM, Abnormal liver enzymes. Hyperlipidemia, See chart Medications: Lantus 30 units , Lispro 3 units. See chart Labs: Last A1C 7.6%. Notable Signs/Symptoms: Increased thirst, frequent urination, chronic fatigue, depression  Lifestyle & Dietary Hx Lives by herself and 22 yr old nephew lives with her.   Estimated daily fluid intake: 49 oz Supplements:  Sleep: poor Stress / self-care: her health issues,  Current average weekly physical activity: ADL  Eats 2-3 meals per day. Drinks water, soda,  Estimated Energy Needs Calories: 1200 Carbohydrate: 135g Protein: 90g Fat: 33g   NUTRITION DIAGNOSIS  NB-1.1 Food and nutrition-related knowledge deficit As related to Diabetes Type 2.  As evidenced by A1C 7.6%.   NUTRITION INTERVENTION  Nutrition education (E-1) on the following topics:  Nutrition and Diabetes education provided on My Plate, CHO counting, meal planning, portion sizes, timing of meals, avoiding snacks between meals unless having a low blood sugar, target  ranges for A1C and blood sugars, signs/symptoms and treatment of hyper/hypoglycemia, monitoring blood sugars, taking medications as prescribed, benefits of exercising 30 minutes per day and prevention of complications of DM.  Lifestyle Medicine  - Whole Food, Plant Predominant Nutrition is highly recommended: Eat Plenty of vegetables, Mushrooms, fruits, Legumes, Whole Grains, Nuts, seeds in lieu of processed meats,  processed snacks/pastries red meat, poultry, eggs.    -It is better to avoid simple carbohydrates including: Cakes, Sweet Desserts, Ice Cream, Soda (diet and regular), Sweet Tea, Candies, Chips, Cookies, Store Bought Juices, Alcohol in Excess of  1-2 drinks a day, Lemonade,  Artificial Sweeteners, Doughnuts, Coffee Creamers, "Sugar-free" Products, etc, etc.  This is not a complete list.....  Exercise: If you are able: 30 -60 minutes a day ,4 days a week, or 150 minutes a week.  The longer the better.  Combine stretch, strength, and aerobic activities.  If you were told in the past that you have high risk for cardiovascular diseases, you may seek evaluation by your heart doctor prior to initiating moderate to intense exercise programs.   Handouts Provided Include  Lifestyle Medicine handouts Sliding Scale handout  Learning Style & Readiness for Change Teaching method utilized: Visual & Auditory  Demonstrated degree of understanding via: Teach Back  Barriers to learning/adherence to lifestyle change: depression  Goals Established by Pt Goals  Eat three meals per day at meals times discussed -based on whole plant based foods Work on cutting down on smoking to 5 cigarrettes a day Test blood sugars before meals and not after meals Use sliding scale insulin schedule. Walking 3 times per week for 30 minutes. Contact Dr. Mayford Knife about referral for counseling.        Don't skip meals  MONITORING & EVALUATION Dietary intake, weekly physical activity, and blood susgars  in 2 weeks.Marland Kitchen  Next Steps  Patient is to work on eating consistent meals.Marland Kitchen

## 2022-08-22 ENCOUNTER — Encounter: Payer: Self-pay | Admitting: Nutrition

## 2022-08-22 LAB — CARBON MONOXIDE, BLOOD (PERFORMED AT REF LAB): Carbon Monoxide, Blood: 11.7 % — ABNORMAL HIGH (ref 0.0–3.6)

## 2022-08-22 LAB — ERYTHROPOIETIN: Erythropoietin: 23.7 m[IU]/mL — ABNORMAL HIGH (ref 2.6–18.5)

## 2022-08-23 NOTE — Telephone Encounter (Signed)
Note done

## 2022-08-26 ENCOUNTER — Encounter (HOSPITAL_COMMUNITY): Payer: Self-pay | Admitting: Hematology

## 2022-08-27 ENCOUNTER — Telehealth: Payer: Self-pay | Admitting: Gastroenterology

## 2022-08-27 NOTE — Telephone Encounter (Signed)
Please NIC for colonoscopy 09/2024. Thanks.

## 2022-08-28 ENCOUNTER — Ambulatory Visit: Payer: BC Managed Care – PPO | Admitting: Orthopedic Surgery

## 2022-08-29 ENCOUNTER — Ambulatory Visit
Admit: 2022-08-29 | Discharge: 2022-08-29 | Disposition: A | Discharge status: Home / Self Care | Payer: BC Managed Care – PPO | Attending: Orthopedic Surgery | Admitting: Orthopedic Surgery

## 2022-08-29 DIAGNOSIS — G8929 Other chronic pain: Secondary | ICD-10-CM

## 2022-08-30 LAB — JAK2 V617F RFX CALR/MPL/E12-15

## 2022-08-30 LAB — CALR +MPL + E12-E15  (REFLEX)

## 2022-09-02 ENCOUNTER — Encounter: Payer: Self-pay | Admitting: Nutrition

## 2022-09-02 ENCOUNTER — Ambulatory Visit (INDEPENDENT_AMBULATORY_CARE_PROVIDER_SITE_OTHER): Payer: BC Managed Care – PPO | Admitting: Nurse Practitioner

## 2022-09-02 ENCOUNTER — Encounter: Payer: BC Managed Care – PPO | Attending: Internal Medicine | Admitting: Nutrition

## 2022-09-02 ENCOUNTER — Encounter: Payer: Self-pay | Admitting: Nurse Practitioner

## 2022-09-02 VITALS — Ht 61.0 in | Wt 172.0 lb

## 2022-09-02 VITALS — BP 150/70 | HR 60 | Ht 61.0 in | Wt 172.6 lb

## 2022-09-02 DIAGNOSIS — Z794 Long term (current) use of insulin: Secondary | ICD-10-CM | POA: Diagnosis not present

## 2022-09-02 DIAGNOSIS — E1165 Type 2 diabetes mellitus with hyperglycemia: Secondary | ICD-10-CM | POA: Diagnosis not present

## 2022-09-02 DIAGNOSIS — E118 Type 2 diabetes mellitus with unspecified complications: Secondary | ICD-10-CM | POA: Insufficient documentation

## 2022-09-02 DIAGNOSIS — I10 Essential (primary) hypertension: Secondary | ICD-10-CM | POA: Insufficient documentation

## 2022-09-02 DIAGNOSIS — E66812 Obesity, class 2: Secondary | ICD-10-CM

## 2022-09-02 LAB — POCT GLYCOSYLATED HEMOGLOBIN (HGB A1C): Hemoglobin A1C: 12.6 % — AB (ref 4.0–5.6)

## 2022-09-02 MED ORDER — FREESTYLE LIBRE 3 SENSOR MISC: 1 refills | Status: DC

## 2022-09-02 NOTE — Patient Instructions (Signed)

## 2022-09-02 NOTE — Progress Notes (Signed)
Endocrinology Consult Note       09/02/2022, 3:39 PM   Subjective:    Patient ID: Suzanne Lawrence, female    DOB: 06/16/1971.  Suzanne Lawrence is being seen in consultation for management of currently uncontrolled symptomatic diabetes requested by  Donetta Potts, MD.   Past Medical History:  Diagnosis Date   Abnormal Pap smear of cervix    Anemia    issue with red blood cells being small   Anxiety    Arthritis    Carpal tunnel syndrome    right   Depression    Diabetes mellitus without complication (HCC)    DVT (deep venous thrombosis) (HCC) 03/2021   left arm   GERD (gastroesophageal reflux disease)    High blood pressure    History of hiatal hernia    Left breast mass    Pre-diabetes     Past Surgical History:  Procedure Laterality Date   BIOPSY  02/05/2022   Procedure: BIOPSY;  Surgeon: Lanelle Bal, DO;  Location: [facility];  Service: Endoscopy;;   COLONOSCOPY  2022   Megan Straughan: 7 polyps removed, has to return in one year   COLONOSCOPY  09/2021   Megan Straughan: 12 polyps removed   ESOPHAGOGASTRODUODENOSCOPY (EGD) WITH PROPOFOL N/A 02/05/2022   Procedure: ESOPHAGOGASTRODUODENOSCOPY (EGD) WITH PROPOFOL;  Surgeon: Lanelle Bal, DO;  Location: [facility];  Service: Endoscopy;  Laterality: N/A;  2:00pm   left shoulder arthroscopy  12/2020   SHOULDER ARTHROSCOPY WITH OPEN ROTATOR CUFF REPAIR AND DISTAL CLAVICLE ACROMINECTOMY Left 03/21/2022   Procedure: LEFT SHOULDER ARTHOSCOPY, DEBRIDEMENT,  MANIPULATION, ROTATOR CUFF REPAIR, ARTHROSCOPIC DISTAL CLAVICLE EXCISION;  Surgeon: Cammy Copa, MD;  Location: [facility];  Service: Orthopedics;  Laterality: Left;   TUBAL LIGATION      Social History   Socioeconomic History   Marital status: Single    Spouse name: Not on file   Number of children: 3   Years of education: Not on file   Highest education level: Some college,  no degree  Occupational History   Not on file  Tobacco Use   Smoking status: Every Day    Packs/day: 0.50    Types: Cigarettes   Smokeless tobacco: Never   Tobacco comments:    Working on cutting down on smoking. Down to 8 cigarrettes a day now.  Vaping Use   Vaping Use: Never used  Substance and Sexual Activity   Alcohol use: Not Currently   Drug use: Not Currently   Sexual activity: Yes    Birth control/protection: None  Other Topics Concern   Not on file  Social History Narrative   Lives at home with 2 daughters   Right handed   Caffeine: about 2-3 per day    Social Determinants of Health   Financial Resource Strain: Not on file  Food Insecurity: Not on file  Transportation Needs: Not on file  Physical Activity: Not on file  Stress: Stress Concern Present (08/22/2022)   [employer] of Occupational Health - Occupational Stress Questionnaire    Feeling of Stress : Very much  Social Connections: Not on file    Family History  Problem Relation  Age of Onset   Lung cancer Mother    Other Father        multiple issues, doesn't know history for sure   Prostate cancer Father    Stroke Father    Aneurysm Paternal Grandmother    Breast cancer Maternal Aunt    Prostate cancer Maternal Uncle    Alcoholism Maternal Uncle    Aneurysm Cousin        paternal side   Aneurysm Cousin        paternal side    Breast cancer Cousin        maternal side    High blood pressure Other        father's side    Cerebral aneurysm Other        cerebral, on father's side    Alcoholism Other        maternal side    Colon cancer Neg Hx     Outpatient Encounter Medications as of 09/02/2022  Medication Sig   acetaminophen (TYLENOL) 325 MG tablet Take 650 mg by mouth every 6 (six) hours as needed for moderate pain.   atenolol (TENORMIN) 50 MG tablet Take 50 mg by mouth daily.   baclofen (LIORESAL) 10 MG tablet Take 10 mg by mouth 3 (three) times daily as needed (Back pain).    Continuous Blood Gluc Sensor (FREESTYLE LIBRE 3 SENSOR) MISC Place 1 sensor on the skin every 14 days. Use to check glucose continuously   DULoxetine (CYMBALTA) 60 MG capsule Take 60 mg by mouth daily in the afternoon.   famotidine (PEPCID) 40 MG tablet Take 40 mg by mouth daily.   Ferrous Gluconate-C-Folic Acid (IRON-C PO) Take 60 mg by mouth daily.   gabapentin (NEURONTIN) 300 MG capsule Take 300 mg by mouth at bedtime.   insulin glargine (LANTUS) 100 UNIT/ML injection Inject 42 Units into the skin daily.   insulin lispro (HUMALOG) 100 UNIT/ML injection Inject 6-12 Units into the skin 3 (three) times daily with meals.   lisinopril (ZESTRIL) 40 MG tablet Take 40 mg by mouth daily.   meclizine (ANTIVERT) 25 MG tablet Take 25 mg by mouth 3 (three) times daily as needed for dizziness.   omeprazole (PRILOSEC) 40 MG capsule Take 40 mg by mouth daily.   sucralfate (CARAFATE) 1 g tablet Take 1 g by mouth daily as needed (Acid reflux).   chlorthalidone (HYGROTON) 25 MG tablet Take 25 mg by mouth every morning. (Patient not taking: Reported on 09/02/2022)   traZODone (DESYREL) 50 MG tablet Take 50 mg by mouth at bedtime as needed for sleep.   [DISCONTINUED] cholecalciferol (VITAMIN D) 25 MCG (1000 UNIT) tablet Take 1,000 Units by mouth daily.   No facility-administered encounter medications on file as of 09/02/2022.    ALLERGIES: Allergies  Allergen Reactions   Ferumoxytol Other (See Comments)    Hot flash, nausea, chest pressure    Hydrochlorothiazide Other (See Comments)     "urinary frequency"; was going every 20 minutes and was causing problems at work    VACCINATION STATUS: Immunization History  Administered Date(s) Administered   [employer] Vaccination 07/06/2020    Diabetes She presents for her initial diabetic visit. She has type 2 diabetes mellitus. Onset time: diagnosed at approx age of 45 (in June) Her disease course has been worsening. Hypoglycemia symptoms include sweats  and tremors. Associated symptoms include blurred vision, fatigue, polydipsia, polyuria, visual change and weight loss. There are no hypoglycemic complications. Symptoms are stable. There are no diabetic  complications. Risk factors for coronary artery disease include diabetes mellitus, dyslipidemia, family history, obesity, hypertension, sedentary lifestyle and tobacco exposure. Current diabetic treatment includes intensive insulin program. She is compliant with treatment most of the time. Her weight is decreasing steadily. She is following a generally unhealthy diet. When asked about meal planning, she reported none. She has had a previous visit with a dietitian. She rarely participates in exercise. Her overall blood glucose range is >200 mg/dl. (She presents today for her consultation with her glucose readings showing improving but still above target glycemic profile.  Her POCT A1c today is 12.6%, increasing from last A1c in July of 7.6%.  She does note some impaired vision as a result.  She monitors glucose 4-5 times daily with traditional fingersticks (was prescribed a CGM but could not afford the copay).  She drinks water (flavored with SF flavoring) and an occasional soda, eats 3 meals per day and some snacks.  She does not engage in routine physical activity.  She is due for eye exam, has seen podiatry in the past.  Analysis of her readings show daily averages over 200.) An ACE inhibitor/angiotensin II receptor blocker is being taken. She sees a podiatrist.Eye exam is not current.     Review of systems  Constitutional: + decreasing body weight, current Body mass index is 32.61 kg/m., no fatigue, no subjective hyperthermia, no subjective hypothermia Eyes: + blurry vision, no xerophthalmia ENT: no sore throat, no nodules palpated in throat, no dysphagia/odynophagia, no hoarseness Cardiovascular: no chest pain, no shortness of breath, no palpitations, no leg swelling Respiratory: no cough, no shortness  of breath Gastrointestinal: no nausea/vomiting/diarrhea Musculoskeletal: no muscle/joint aches Skin: no rashes, no hyperemia Neurological: no tremors, no numbness, no tingling, no dizziness Psychiatric: no depression, no anxiety  Objective:     BP (!) 150/70 (BP Location: Right Arm, Patient Position: Sitting) Comment (BP Location): Lower Arm - Manuel Cuff  Pulse 60   Ht 5\' 1"  (1.549 m)   Wt 172 lb 9.6 oz (78.3 kg)   BMI 32.61 kg/m   Wt Readings from Last 3 Encounters:  09/02/22 172 lb (78 kg)  09/02/22 172 lb 9.6 oz (78.3 kg)  08/21/22 173 lb (78.5 kg)     BP Readings from Last 3 Encounters:  09/02/22 (!) 150/70  08/09/22 (!) 166/79  03/21/22 138/85     Physical Exam- Limited  Constitutional:  Body mass index is 32.61 kg/m. , not in acute distress, normal state of mind Eyes:  EOMI, no exophthalmos Neck: Supple Cardiovascular: RRR, + murmur, rubs, or gallops, no edema Respiratory: Adequate breathing efforts, no crackles, rales, rhonchi, or wheezing Musculoskeletal: no gross deformities, strength intact in all four extremities, no gross restriction of joint movements Skin:  no rashes, no hyperemia Neurological: no tremor with outstretched hands    CMP ( most recent) CMP     Component Value Date/Time   NA 132 (L) 08/09/2022 1258   K 3.9 08/09/2022 1258   CL 95 (L) 08/09/2022 1258   CO2 24 08/09/2022 1258   GLUCOSE 625 (HH) 08/09/2022 1258   BUN 9 08/09/2022 1258   BUN 6 08/06/2022 0000   CREATININE 0.92 08/09/2022 1258   CALCIUM 9.3 08/09/2022 1258   ALBUMIN 5.3 (A) 08/02/2022 0000   AST 67 (A) 08/06/2022 0000   ALT 63 (A) 08/06/2022 0000   ALKPHOS 125 08/02/2022 0000   GFRNONAA >60 08/09/2022 1258     Diabetic Labs (most recent): Lab Results  Component Value Date  HGBA1C 12.6 (A) 09/02/2022   HGBA1C 7.6 06/03/2022     Lipid Panel ( most recent) Lipid Panel  No results found for: "CHOL", "TRIG", "HDL", "CHOLHDL", "VLDL", "LDLCALC", "LDLDIRECT",  "LABVLDL"    No results found for: "TSH", "FREET4"         Assessment & Plan:   1) Type 2 diabetes mellitus with hyperglycemia, with long-term current use of insulin (HCC)  She presents today for her consultation with her glucose readings showing improving but still above target glycemic profile.  Her POCT A1c today is 12.6%, increasing from last A1c in July of 7.6%.  She does note some impaired vision as a result.  She monitors glucose 4-5 times daily with traditional fingersticks (was prescribed a CGM but could not afford the copay).  She drinks water (flavored with SF flavoring) and an occasional soda, eats 3 meals per day and some snacks.  She does not engage in routine physical activity.  She is due for eye exam, has seen podiatry in the past.  Analysis of her readings show daily averages over 200.  - Suzanne Lawrence has currently uncontrolled symptomatic type 2 DM since 52 years of age, with most recent A1c of 12.6 %.   -Recent labs reviewed.  - I had a long discussion with her about the progressive nature of diabetes and the pathology behind its complications. -her diabetes is not currently complicated but she remains at a high risk for more acute and chronic complications which include CAD, CVA, CKD, retinopathy, and neuropathy. These are all discussed in detail with her.  The following Lifestyle Medicine recommendations according to American College of Lifestyle Medicine [facility]) were discussed and offered to patient and she agrees to start the journey:  A. Whole Foods, Plant-based plate comprising of fruits and vegetables, plant-based proteins, whole-grain carbohydrates was discussed in detail with the patient.   A list for source of those nutrients were also provided to the patient.  Patient will use only water or unsweetened tea for hydration. B.  The need to stay away from risky substances including alcohol, smoking; obtaining 7 to 9 hours of restorative sleep, at least 150 minutes  of moderate intensity exercise weekly, the importance of healthy social connections,  and stress reduction techniques were discussed. C.  A full color page of  Calorie density of various food groups per pound showing examples of each food groups was provided to the patient.  - I have counseled her on diet and weight management by adopting a carbohydrate restricted/protein rich diet. Patient is encouraged to switch to unprocessed or minimally processed complex starch and increased protein intake (animal or plant source), fruits, and vegetables. -  she is advised to stick to a routine mealtimes to eat 3 meals a day and avoid unnecessary snacks (to snack only to correct hypoglycemia).   - she acknowledges that there is a room for improvement in her food and drink choices. - Suggestion is made for her to avoid simple carbohydrates from her diet including Cakes, Sweet Desserts, Ice Cream, Soda (diet and regular), Sweet Tea, Candies, Chips, Cookies, Store Bought Juices, Alcohol in Excess of 1-2 drinks a day, Artificial Sweeteners, Coffee Creamer, and "Sugar-free" Products. This will help patient to have more stable blood glucose profile and potentially avoid unintended weight gain.  - she has been seeing Norm Salt, RDN, CDE for diabetes education.  - I have approached her with the following individualized plan to manage her diabetes and patient agrees:   She is  advised to continue her Lantus 42 units SQ nightly, will adjust her Humalog to 6-12 units TID with meals if glucose is above 90 and she is eating (Specific instructions on how to titrate insulin dosage based on glucose readings given to patient in writing).  She demonstrated her ability to use the SSI chart properly with me today to dose her meal time insulin.  -she is encouraged to start monitoring glucose 4 times daily, before meals and before bed, to log their readings on the clinic sheets provided, and bring them to review at follow up  appointment in 4 weeks.  I did give her sample Libre 3 and will send in script to the pharmacy for her (she may be able to get reduced cost by calling the company).  - she is warned not to take insulin without proper monitoring per orders. - Adjustment parameters are given to her for hypo and hyperglycemia in writing. - she is encouraged to call clinic for blood glucose levels less than 70 or above 300 mg /dl.  - she will be considered for incretin therapy as appropriate next visit.  - Specific targets for  A1c; LDL, HDL, and Triglycerides were discussed with the patient.  2) Blood Pressure /Hypertension:  her blood pressure is NOT controlled to target.   she is advised to continue her current medications including Lisinopril 40 mg p.o. daily with breakfast, and Atenolol 50 mg po daily.  3) Lipids/Hyperlipidemia:    There is no recent lipid panel available to review, nor is she on any lipid lowering medications.  Will check lipid panel on subsequent visits.  4)  Weight/Diet:  her Body mass index is 32.61 kg/m.  -  clearly complicating her diabetes care.   she is a candidate for weight loss. I discussed with her the fact that loss of 5 - 10% of her  current body weight will have the most impact on her diabetes management.  Exercise, and detailed carbohydrates information provided  -  detailed on discharge instructions.  5) Chronic Care/Health Maintenance: -she is on ACEI/ARB and not on Statin medications and is encouraged to initiate and continue to follow up with Ophthalmology, Dentist, Podiatrist at least yearly or according to recommendations, and advised to QUIT SMOKING. I have recommended yearly flu vaccine and pneumonia vaccine at least every 5 years; moderate intensity exercise for up to 150 minutes weekly; and sleep for at least 7 hours a day.  - she is advised to maintain close follow up with Donetta PottsWilliams, Gordon F, MD for primary care needs, as well as her other providers for optimal and  coordinated care.   - Time spent in this patient care: 60 min, of which > 50% was spent in counseling her about her diabetes and the rest reviewing her blood glucose logs, discussing her hypoglycemia and hyperglycemia episodes, reviewing her current and previous labs/studies (including abstraction from other facilities) and medications doses and developing a long term treatment plan based on the latest standards of care/guidelines; and documenting her care.    Please refer to Patient Instructions for Blood Glucose Monitoring and Insulin/Medications Dosing Guide" in media tab for additional information. Please also refer to "Patient Self Inventory" in the Media tab for reviewed elements of pertinent patient history.  Suzanne AmberJennifer Lawrence participated in the discussions, expressed understanding, and voiced agreement with the above plans.  All questions were answered to her satisfaction. she is encouraged to contact clinic should she have any questions or concerns prior to her return  visit.     Follow up plan: - Return in about 4 weeks (around 09/30/2022) for Diabetes F/U, Bring meter and logs.    Ronny Bacon, [facility] [address] [place], [place] [address] Phone: [phone] Fax: [phone]  09/02/2022, 3:39 PM

## 2022-09-02 NOTE — Patient Instructions (Addendum)
Goals  Cut out flavor packets and choose plain water No snacks in between meals Increase more fruits, vegetables and whole grains. Don't eat after 7 pm. Get A1C down to 7% Walk 15 minutes a day.

## 2022-09-02 NOTE — Progress Notes (Signed)
Medical Nutrition Therapy  Appointment Start time:  2952  Appointment End time:  1500  Primary concerns today: Dm Type 2, Obesity  Referral diagnosis: E11.8, E66.01 Preferred learning style: No preference Learning readiness: Contemplating   NUTRITION ASSESSMENT DM Type 2 Follow up  52 yr old bfemale here with Type 2 DM.  Saw Rayetta Pigg, today. Got LIbre 3. FBS 97-255 Before lunch 79-181 mg/dl, Before dinner 93-305 Before bed 199-335 mg/dl. A1C today is 12.6%. She admits to snacking and adding flavor packets to her water. Tends to eat snacks after supper also, contributing to high  night time blood sugars. Struggles with cooking and planning healthier meals. Currently taking 42 units of  Lantus and  will increase to 6-9 units of insulin with meals per sliding scale.  She is willing to work harder on her whole foods, plant based meals and cutting out snacks.  Is currently long term disability for shoulder injury- [employer] Working on cutting down on smoking. Smoking down to 8 cigarrettes a day.  Anthropometrics  Wt Readings from Last 3 Encounters:  09/02/22 172 lb 9.6 oz (78.3 kg)  08/21/22 173 lb (78.5 kg)  08/09/22 164 lb (74.4 kg)   Ht Readings from Last 3 Encounters:  09/02/22 5\' 1"  (1.549 m)  08/21/22 5\' 1"  (1.549 m)  08/09/22 5\' 1"  (1.549 m)   There is no height or weight on file to calculate BMI. @BMIFA @ Facility age limit for growth %iles is 20 years. Facility age limit for growth %iles is 20 years.     Latest Ref Rng & Units 08/09/2022   12:58 PM 08/06/2022   12:00 AM 08/02/2022   12:00 AM  CMP  Glucose 70 - 99 mg/dL 625     BUN 6 - 20 mg/dL 9  6     10       Creatinine 0.44 - 1.00 mg/dL 0.92  0.8     0.9      Sodium 135 - 145 mmol/L 132     Potassium 3.5 - 5.1 mmol/L 3.9     Chloride 98 - 111 mmol/L 95  91     93      CO2 22 - 32 mmol/L 24     Calcium 8.9 - 10.3 mg/dL 9.3   10.3      Alkaline Phos 25 - 125   125      AST 13 - 35  67      85      ALT 7 - 35 U/L  63     87         This result is from an external source.   Lab Results  Component Value Date   HGBA1C 12.6 (A) 09/02/2022    Clinical Medical Hx: Depression, Obesity, Type 2 DM, Abnormal liver enzymes. Hyperlipidemia, See chart Medications: Lantus 30 units , Lispro 3 units. See chart Labs: Last A1C 7.6%. Notable Signs/Symptoms: Increased thirst, frequent urination, chronic fatigue, depression  Lifestyle & Dietary Hx Lives by herself and 32 yr old nephew lives with her.   Estimated daily fluid intake: 49 oz Supplements:  Sleep: poor Stress / self-care: her health issues,  Current average weekly physical activity: ADL  B) [place] cherrios  1 c with unsweet almond milk L) Fruit, 1/2 ham/cheese sandwich on [employer] bread   Water D)  Spaghetti and salad, water Handful of cheese its,   Estimated Energy Needs Calories: 1200 Carbohydrate: 135g Protein: 90g Fat: 33g   NUTRITION DIAGNOSIS  NB-1.1 Food and nutrition-related knowledge deficit As related to Diabetes Type 2.  As evidenced by A1C 7.6%.   NUTRITION INTERVENTION  Nutrition education (E-1) on the following topics:  Nutrition and Diabetes education provided on My Plate, CHO counting, meal planning, portion sizes, timing of meals, avoiding snacks between meals unless having a low blood sugar, target ranges for A1C and blood sugars, signs/symptoms and treatment of hyper/hypoglycemia, monitoring blood sugars, taking medications as prescribed, benefits of exercising 30 minutes per day and prevention of complications of DM.  Lifestyle Medicine  - Whole Food, Plant Predominant Nutrition is highly recommended: Eat Plenty of vegetables, Mushrooms, fruits, Legumes, Whole Grains, Nuts, seeds in lieu of processed meats, processed snacks/pastries red meat, poultry, eggs.    -It is better to avoid simple carbohydrates including: Cakes, Sweet Desserts, Ice Cream, Soda (diet and regular), Sweet Tea, Candies,  Chips, Cookies, Store Bought Juices, Alcohol in Excess of  1-2 drinks a day, Lemonade,  Artificial Sweeteners, Doughnuts, Coffee Creamers, "Sugar-free" Products, etc, etc.  This is not a complete list.....  Exercise: If you are able: 30 -60 minutes a day ,4 days a week, or 150 minutes a week.  The longer the better.  Combine stretch, strength, and aerobic activities.  If you were told in the past that you have high risk for cardiovascular diseases, you may seek evaluation by your heart doctor prior to initiating moderate to intense exercise programs.   Handouts Provided Include  Lifestyle Medicine handouts Sliding Scale handout  Learning Style & Readiness for Change Teaching method utilized: Visual & Auditory  Demonstrated degree of understanding via: Teach Back  Barriers to learning/adherence to lifestyle change: depression  Goals Established by Pt  Goals  Cut out flavor packets and choose plain water No snacks in between meals Increase more fruits, vegetables and whole grains. Don't eat after 7 pm. Get A1C down to 7% Walk 15 minutes a day.  MONITORING & EVALUATION Dietary intake, weekly physical activity, and blood susgars  in 1 month  Next Steps  Patient is to work on eating consistent meals.Marland Kitchen

## 2022-09-05 LAB — ALPHA-THALASSEMIA GENOTYPR

## 2022-09-06 NOTE — Progress Notes (Signed)
[facility] [address], [place] [address]   CLINIC:  Medical Oncology/Hematology  PCP:  Donetta Potts, MD [address] [place] [address] [phone]   REASON FOR VISIT: Iron deficiency anemia   PRIOR THERAPY: Iron tablets   CURRENT THERAPY: Intermittent IV iron (last on 12/18/2021 and 12/26/2021)  INTERVAL HISTORY:   Suzanne Lawrence 52 y.o. female returns for routine follow-up of  iron deficiency anemia.  She was last evaluated via telemedicine visit by Rojelio Brenner PA-C on 08/07/2022.   At today's visit, she reports feeling fair.  She has had two ED visits for severely elevated blood sugar.  She denies any other recent hospitalizations, surgeries, or changes in her baseline health status.   She has not had any major menstrual bleeding since uterine ablation in March 2023, but had one episode "minor vaginal bleeding" in summer 2023.  No rectal bleeding or melena; she has dark green bowel movements from time to time.   She still has some residual fatigue with energy about 25%, which she attributes to her uncontrolled diabetes and very high glucose.    She has some chronic dyspnea on exertion from "being out of shape." She reports dizziness/lightheadedness from vertigo, as well as some palpitations.  She denies any headaches, chest pain, or syncope. She reports that her primary care doctor stopped her iron supplements in October 2023.    She has 25% energy and 50% appetite.  Previously reported weight loss has resolved and she has started to regain some weight after being started on insulin.   ASSESSMENT & PLAN:  1.  Iron deficiency anemia: - She has been on iron tablet daily since January 2023, and is tolerating well, but with only very minimal improvement in Hgb - She has menorrhagia, status post endometrial ablation in March 2023.  Since uterine ablation, she has had "mini menstrual cycles," with light bleeding about 5 days each month. - She denies  bright red blood per rectum or melena. - Colonoscopy (10/03/2021): Removal of 2 sessile polyps in the descending colon, 2 sessile polyps in the sigmoid colon, 4 sessile polyps in the sigmoid colon, 3 sessile polyps in the rectosigmoid colon.  Pathology report shows all removed polyps to be hyperplastic with no dysplasia or malignancy. - EGD (02/05/2022): Small hiatal hernia, gastritis, gastric polyps x3 - She received IV Feraheme x2 (12/18/2021 and 12/26/2021).  She had mild INFUSION REACTION to first dose of Feraheme, was able to complete after receiving IV steroids.  Tolerated her second IV Feraheme infusion well with premedications. - Most recent labs (08/07/2022): Hgb 16.0/HCT 47.8, ferritin 167, iron saturation 25% - Iron supplement was stopped in October 2023 by her PCP - PLAN: No indication for IV iron at this time.  Her iron deficiency anemia appears to have resolved after treatment of her menorrhagia.  Will continue monitoring over the next year, but if stable would be elevated for discharge from clinic. - CBC/D and iron panel in 6 months    2.  Microcytosis - Persistent microcytosis and elevated RBC, regardless of hemoglobin or iron levels. - Hemoglobin fractionation cascade was normal (03/15/2022), although this does not exclude alpha thalassemia trait.  Copper levels were normal. - Alpha thalassemia genotype was POSITIVE for alpha thalassemia trait - PLAN: We discussed diagnosis of alpha thalassemia trait (unaffected carrier state).     3.  Erythrocytosis - Elevated hemoglobin since July 2023 with Hgb 15.3/HCT 47.4 (03/08/2022) and Hgb 16.0/HCT 47.8 (08/07/2022) - Patient  smokes <0.5 PPD cigarettes - No known history of sleep apnea, but she has been told that she snores. - No obvious carbon monoxide exposure, but she has gas heat at home, along with CO monitor.   - Hematology workup (08/21/2022): Elevated blood carbon monoxide 11.7% (usually <9.9% in smokers) Mildly elevated erythropoietin  23.7 Negative JAK2, CALR, MPL - Most recent CBC (08/21/2022) with RBC 5.62, Hgb 14.9, hematocrit 45.3 - She may have some diuretic effect from her chlorthalidone which could be causing hemoconcentration.  Additionally, she has had severe hyperglycemia for the past several months (blood sugars in the 400s), which could also be causing some diuretic effect. 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  Elevated RBC also from alpha thalassemia trait.   - PLAN: Referral for sleep study.   - Recommend smoking cessation  - CBC in 6 months    4.  Weight loss - Previously reported weight loss resolved after being started on insulin  5.  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  She had exposure to chemicals including heptane and toluene. - She is current active smoker, 1 pack every 2 to 3 days for 25 years.  (Does not qualify for LCS/LDCT chest due to high pack-year history <20) - No family history of severe anemia or sickle cell disease.  Mother had lung cancer.  Father had prostate and colon cancer.  Maternal aunt had breast cancer.  Maternal and paternal cousins had breast cancer.  Maternal uncle had colon cancer.  PLAN SUMMARY: >> Referral for sleep study (indication = secondary polycythemia, snoring) >> Labs in 6 months (CBC/D, ferritin, iron/TIBC) >> OFFICE visit 1 week after labs     REVIEW OF SYSTEMS:   Review of Systems  Constitutional:  Positive for fatigue. Negative for appetite change, chills, diaphoresis, fever and unexpected weight change.  HENT:   Negative for lump/mass and nosebleeds.   Eyes:  Negative for eye problems.  Respiratory:  Positive for shortness of breath (with exertion). Negative for cough and hemoptysis.   Cardiovascular:  Positive for palpitations. Negative for chest pain and leg  swelling.  Gastrointestinal:  Negative for abdominal pain, blood in stool, constipation, diarrhea, nausea and vomiting.  Genitourinary:  Negative for hematuria.   Skin: Negative.   Neurological:  Positive for dizziness and numbness. Negative for headaches and light-headedness.  Hematological:  Does not bruise/bleed easily.  Psychiatric/Behavioral:  Positive for depression and sleep disturbance. The patient is nervous/anxious.      PHYSICAL EXAM:  ECOG PERFORMANCE STATUS: 1 - Symptomatic but completely ambulatory  There were no vitals filed for this visit. There were no vitals filed for this visit. Physical Exam Constitutional:      Appearance: Normal appearance. She is obese.  HENT:     Head: Normocephalic and atraumatic.     Mouth/Throat:     Mouth: Mucous membranes are moist.  Eyes:     Extraocular Movements: Extraocular movements intact.     Pupils: Pupils are equal, round, and reactive to light.  Cardiovascular:     Rate and Rhythm: Normal rate and regular rhythm.     Pulses: Normal pulses.     Heart sounds: Normal heart sounds.  Pulmonary:     Effort: Pulmonary effort is normal.     Breath sounds: Normal breath sounds.  Abdominal:     General: Bowel sounds are normal.  Palpations: Abdomen is soft.     Tenderness: There is no abdominal tenderness.  Musculoskeletal:        General: No swelling.     Right lower leg: No edema.     Left lower leg: No edema.  Lymphadenopathy:     Cervical: No cervical adenopathy.  Skin:    General: Skin is warm and dry.  Neurological:     General: No focal deficit present.     Mental Status: She is alert and oriented to person, place, and time.  Psychiatric:        Mood and Affect: Mood normal.        Behavior: Behavior normal.     PAST MEDICAL/SURGICAL HISTORY:  Past Medical History:  Diagnosis Date   Abnormal Pap smear of cervix    Anemia    issue with red blood cells being small   Anxiety    Arthritis    Carpal tunnel  syndrome    right   Depression    Diabetes mellitus without complication (HCC)    DVT (deep venous thrombosis) (HCC) 03/2021   left arm   GERD (gastroesophageal reflux disease)    High blood pressure    History of hiatal hernia    Left breast mass    Pre-diabetes    Past Surgical History:  Procedure Laterality Date   BIOPSY  02/05/2022   Procedure: BIOPSY;  Surgeon: Lanelle Bal, DO;  Location: [facility];  Service: Endoscopy;;   COLONOSCOPY  2022   Megan Straughan: 7 polyps removed, has to return in one year   COLONOSCOPY  09/2021   Megan Straughan: 12 polyps removed   ESOPHAGOGASTRODUODENOSCOPY (EGD) WITH PROPOFOL N/A 02/05/2022   Procedure: ESOPHAGOGASTRODUODENOSCOPY (EGD) WITH PROPOFOL;  Surgeon: Lanelle Bal, DO;  Location: [facility];  Service: Endoscopy;  Laterality: N/A;  2:00pm   left shoulder arthroscopy  12/2020   SHOULDER ARTHROSCOPY WITH OPEN ROTATOR CUFF REPAIR AND DISTAL CLAVICLE ACROMINECTOMY Left 03/21/2022   Procedure: LEFT SHOULDER ARTHOSCOPY, DEBRIDEMENT,  MANIPULATION, ROTATOR CUFF REPAIR, ARTHROSCOPIC DISTAL CLAVICLE EXCISION;  Surgeon: Cammy Copa, MD;  Location: [facility] OR;  Service: Orthopedics;  Laterality: Left;   TUBAL LIGATION      SOCIAL HISTORY:  Social History   Socioeconomic History   Marital status: Single    Spouse name: Not on file   Number of children: 3   Years of education: Not on file   Highest education level: Some college, no degree  Occupational History   Not on file  Tobacco Use   Smoking status: Every Day    Packs/day: 0.50    Types: Cigarettes   Smokeless tobacco: Never   Tobacco comments:    Working on cutting down on smoking. Down to 8 cigarrettes a day now.  Vaping Use   Vaping Use: Never used  Substance and Sexual Activity   Alcohol use: Not Currently   Drug use: Not Currently   Sexual activity: Yes    Birth control/protection: None  Other Topics Concern   Not on file  Social History Narrative    Lives at home with 2 daughters   Right handed   Caffeine: about 2-3 per day    Social Determinants of Health   Financial Resource Strain: Not on file  Food Insecurity: Not on file  Transportation Needs: Not on file  Physical Activity: Not on file  Stress: Stress Concern Present (08/22/2022)   [employer] of Occupational Health - Occupational Stress Questionnaire  Feeling of Stress : Very much  Social Connections: Not on file  Intimate Partner Violence: Not on file    FAMILY HISTORY:  Family History  Problem Relation Age of Onset   Lung cancer Mother    Other Father        multiple issues, doesn't know history for sure   Prostate cancer Father    Stroke Father    Aneurysm Paternal Grandmother    Breast cancer Maternal Aunt    Prostate cancer Maternal Uncle    Alcoholism Maternal Uncle    Aneurysm Cousin        paternal side   Aneurysm Cousin        paternal side    Breast cancer Cousin        maternal side    High blood pressure Other        father's side    Cerebral aneurysm Other        cerebral, on father's side    Alcoholism Other        maternal side    Colon cancer Neg Hx     CURRENT MEDICATIONS:  Outpatient Encounter Medications as of 09/09/2022  Medication Sig   acetaminophen (TYLENOL) 325 MG tablet Take 650 mg by mouth every 6 (six) hours as needed for moderate pain.   atenolol (TENORMIN) 50 MG tablet Take 50 mg by mouth daily.   baclofen (LIORESAL) 10 MG tablet Take 10 mg by mouth 3 (three) times daily as needed (Back pain).   chlorthalidone (HYGROTON) 25 MG tablet Take 25 mg by mouth every morning. (Patient not taking: Reported on 09/02/2022)   Continuous Blood Gluc Sensor (FREESTYLE LIBRE 3 SENSOR) MISC Place 1 sensor on the skin every 14 days. Use to check glucose continuously   DULoxetine (CYMBALTA) 60 MG capsule Take 60 mg by mouth daily in the afternoon.   famotidine (PEPCID) 40 MG tablet Take 40 mg by mouth daily.   Ferrous Gluconate-C-Folic  Acid (IRON-C PO) Take 60 mg by mouth daily.   gabapentin (NEURONTIN) 300 MG capsule Take 300 mg by mouth at bedtime.   insulin glargine (LANTUS) 100 UNIT/ML injection Inject 42 Units into the skin daily.   insulin lispro (HUMALOG) 100 UNIT/ML injection Inject 6-12 Units into the skin 3 (three) times daily with meals.   lisinopril (ZESTRIL) 40 MG tablet Take 40 mg by mouth daily.   meclizine (ANTIVERT) 25 MG tablet Take 25 mg by mouth 3 (three) times daily as needed for dizziness.   omeprazole (PRILOSEC) 40 MG capsule Take 40 mg by mouth daily.   sucralfate (CARAFATE) 1 g tablet Take 1 g by mouth daily as needed (Acid reflux).   traZODone (DESYREL) 50 MG tablet Take 50 mg by mouth at bedtime as needed for sleep.   No facility-administered encounter medications on file as of 09/09/2022.    ALLERGIES:  Allergies  Allergen Reactions   Ferumoxytol Other (See Comments)    Hot flash, nausea, chest pressure    Hydrochlorothiazide Other (See Comments)     "urinary frequency"; was going every 20 minutes and was causing problems at work    LABORATORY DATA:  I have reviewed the labs as listed.  CBC    Component Value Date/Time   WBC 8.9 08/21/2022 1114   RBC 5.62 (H) 08/21/2022 1114   HGB 14.9 08/21/2022 1114   HCT 45.3 08/21/2022 1114   PLT 300 08/21/2022 1114   MCV 80.6 08/21/2022 1114   MCH 26.5 08/21/2022 1114  MCHC 32.9 08/21/2022 1114   RDW 13.9 08/21/2022 1114   LYMPHSABS 3.1 08/21/2022 1114   MONOABS 0.6 08/21/2022 1114   EOSABS 0.1 08/21/2022 1114   BASOSABS 0.1 08/21/2022 1114      Latest Ref Rng & Units 08/09/2022   12:58 PM 08/06/2022   12:00 AM 08/02/2022   12:00 AM  CMP  Glucose 70 - 99 mg/dL 696     BUN 6 - 20 mg/dL 9  6     10       Creatinine 0.44 - 1.00 mg/dL 2.95  0.8     0.9      Sodium 135 - 145 mmol/L 132     Potassium 3.5 - 5.1 mmol/L 3.9     Chloride 98 - 111 mmol/L 95  91     93      CO2 22 - 32 mmol/L 24     Calcium 8.9 - 10.3 mg/dL 9.3   28.4       Alkaline Phos 25 - 125   125      AST 13 - 35  67     85      ALT 7 - 35 U/L  63     87         This result is from an external source.    DIAGNOSTIC IMAGING:  I have independently reviewed the relevant imaging and discussed with the patient.   WRAP UP:  All questions were answered. The patient knows to call the clinic with any problems, questions or concerns.  Medical decision making: Moderate  Time spent on visit: I spent 20 minutes counseling the patient face to face. The total time spent in the appointment was 30 minutes and more than 50% was on counseling.  Carnella Guadalajara, PA-C  09/09/2022 11:21 AM

## 2022-09-09 ENCOUNTER — Inpatient Hospital Stay: Payer: BC Managed Care – PPO | Attending: Hematology | Admitting: Physician Assistant

## 2022-09-09 ENCOUNTER — Other Ambulatory Visit: Payer: Self-pay

## 2022-09-09 ENCOUNTER — Inpatient Hospital Stay: Payer: BC Managed Care – PPO | Admitting: Dietician

## 2022-09-09 VITALS — BP 180/92 | HR 63 | Temp 98.4°F | Resp 18 | Ht 61.0 in | Wt 172.7 lb

## 2022-09-09 DIAGNOSIS — D563 Thalassemia minor: Secondary | ICD-10-CM

## 2022-09-09 DIAGNOSIS — D500 Iron deficiency anemia secondary to blood loss (chronic): Secondary | ICD-10-CM | POA: Diagnosis not present

## 2022-09-09 DIAGNOSIS — R634 Abnormal weight loss: Secondary | ICD-10-CM | POA: Insufficient documentation

## 2022-09-09 DIAGNOSIS — D751 Secondary polycythemia: Secondary | ICD-10-CM

## 2022-09-09 DIAGNOSIS — E119 Type 2 diabetes mellitus without complications: Secondary | ICD-10-CM | POA: Insufficient documentation

## 2022-09-09 DIAGNOSIS — Z8042 Family history of malignant neoplasm of prostate: Secondary | ICD-10-CM | POA: Insufficient documentation

## 2022-09-09 DIAGNOSIS — D509 Iron deficiency anemia, unspecified: Secondary | ICD-10-CM | POA: Insufficient documentation

## 2022-09-09 DIAGNOSIS — R718 Other abnormality of red blood cells: Secondary | ICD-10-CM

## 2022-09-09 DIAGNOSIS — F17210 Nicotine dependence, cigarettes, uncomplicated: Secondary | ICD-10-CM | POA: Diagnosis not present

## 2022-09-09 DIAGNOSIS — Z803 Family history of malignant neoplasm of breast: Secondary | ICD-10-CM | POA: Diagnosis not present

## 2022-09-09 DIAGNOSIS — Z801 Family history of malignant neoplasm of trachea, bronchus and lung: Secondary | ICD-10-CM | POA: Diagnosis not present

## 2022-09-09 DIAGNOSIS — D560 Alpha thalassemia: Secondary | ICD-10-CM | POA: Insufficient documentation

## 2022-09-09 DIAGNOSIS — Z800 Family history of malignant neoplasm of digestive organs: Secondary | ICD-10-CM | POA: Diagnosis not present

## 2022-09-09 DIAGNOSIS — Z794 Long term (current) use of insulin: Secondary | ICD-10-CM | POA: Insufficient documentation

## 2022-09-09 NOTE — Progress Notes (Signed)
GI Office Note    Referring Provider: Donetta Potts, MD Primary Care Physician:  Donetta Potts, MD Primary Gastroenterologist: Hennie Duos. Marletta Lor, DO  Date:  09/10/2022  ID:  Suzanne Lawrence, DOB 10/15/1970, MRN [id number]   Chief Complaint   Chief Complaint  Patient presents with   Elevated Hepatic Enzymes   History of Present Illness  [name] is a 52 y.o. female with a history of  anxiety, diabetes, GERD, DVT, HTN, arthritis, depression, GERD presenting today for follow up and to discuss abdominal imaging findings.   Colonoscopy January 2022 (Dr. Arlyn Dunning) Impression:   - One 13 mm polyp in the distal transverse colon,                         removed with a hot snare. Resected and retrieved.                         - One 4 mm polyp in the descending colon, removed with                         a cold biopsy forceps. Resected and retrieved.                         - One 6 mm polyp at 50 cm proximal to the anus,                         removed with a cold biopsy forceps. Resected and                         retrieved.                         - One 9 mm polyp at 40 cm proximal to the anus,                         removed with a hot snare. Resected and retrieved.                         - One 7 mm polyp at 25 cm proximal to the anus,                         removed with a hot snare. Resected and retrieved.                         - Two 5 mm polyps at 24 cm proximal to the anus,                         removed with a cold biopsy forceps. Resected and                         retrieved.  Recommendation:                                 - Resume previous diet today.                         -  Await pathology results.                         - Repeat colonoscopy in 1 year for surveillance based                         on pathology results.                                                                   Colonoscopy February 2023 (Dr. Rozell Searing):                         - Two 3 to 4 mm polyps in the sigmoid colon and at 20                         cm proximal to the anus, removed with a cold biopsy                         forceps. Resected and retrieved.                         - Two 3 to 4 mm polyps in the descending colon,                         removed with a cold biopsy forceps. Resected and                         retrieved.                         - One 4 mm polyp in the descending colon, removed with                         a cold biopsy forceps. Resected and retrieved.                         - Four 3 to 4 mm polyps in the sigmoid colon, removed                         with a cold biopsy forceps. Resected and retrieved.                         - Three 2 to 4 mm polyps at the recto-sigmoid colon,                         removed with a cold biopsy forceps. Resected and                         retrieved.  Recommendation:                         - Resume previous diet today.                         -  Await pathology results.                         - Return to my office in 2 weeks.   Patient placed on recall for TCS in February 2026.   Last office visit 01/23/22. Noted to have history of recent DVT in September 2022 and had ongoing reflux which improved on Dexilant and Pepcid nightly.  Upper GI series negative.  Had recently developed IDA in the setting of heavy menses, following with heme-onc.  Reported recent colonoscopy 2022 as stated above.  Noted to have labs in February 2023 with iron deficiency and hemoglobin 7.9.  LFTs normal at that time.  At the time of her visit she denied heartburn but did report gurgling noise in her upper abdomen and chest at night and some regurgitation.  Denied dysphagia, nausea, or vomiting.  Also with gas pains.  States she gained 20 pounds in the prior 9 months was having loose stools every day after meals.  Thought secondary to metformin given it started a few weeks prior.  Due to have shoulder surgery in  June 2023.  She is advised to stop Carafate continue Dexilant once daily before breakfast and famotidine nightly.  Scheduled for EGD with Dr. Marletta Lor.  Repeat labs in March 2023 with ferritin 5.6, iron 20, hemoglobin 8.4.  Underwent endometrial ablation in March 2023.  Received 2 iron infusions after following with [facility].  EGD 02/05/22: -normal esophagus -small hiatal hernia -gastritis s/po biopsy -3 gastric polyps removed s/p biopsy.  -Continue dexilant 60 mg daily.   Patient called with c/o diarrhea and "capsules or polyps" seen within her stool..   Labs 08/11/2022: Normal LFTs, bilirubin, alk phos. Glucose 682, hemoglobin 15.4, MCV 78.6  Labs 08/21/22: Hgb 14.9, carbon monoxide levels 11.7 (H)  RUQ [place] 08/09/22:  -The echogenicity of the liver is increased. This is a nonspecific  finding but is most commonly seen with fatty infiltration of the  liver.  -There are a few non-mass-like areas of decreased attenuation  including adjacent to the gallbladder fossa, which are favored to  represent areas of focal fatty sparing. No discrete hepatic mass  lesion identified.  -Hepatomegaly  Today: GERD: Out of famotidine. Taking omeprazole daily. Has rare breakthrough symptoms. Takes sucralfate as needed for breakthrough.   IDA: Was feeling better after ablation but developed vision troubles which is possibly related to her diabetes and hypertension.   Diarrhea: Couple months ago she had diarrhea and states she though she had stools shaped like polyps or capsules. Was having about 6-7 BM per day, seemed constant. No longer having problems with diarrhea. Happen rarely now. Has a BM everyday, sometimes soft and sometimes solid. No straining. No incontinence.   Has appointment with endocrinology 09/30/22. Taking lispro pre meals TID and lantus before bed. No oral medications. Doing high fiber diet and high protein. Has continuous monitor.   Takes lisinopril, atenolol, chlorthalidone.   Has taken her blood pressure medicine this morning.  Has been having gas pains behind her chest.  Denies any overt chest pain.  Having slight headache right now. Having double vision and blurry vision - glasses not helping this. Just seen PCP last week and goes back in 6 weeks. Always runs a little high around 150s. Not usually 190s. No peripheral edema. When she wakes up in the mornings her face, lips, eyes tell her with tightness.    Current Outpatient Medications  Medication Sig Dispense  Refill   ACCU-CHEK GUIDE test strip 3 (three) times daily.     Accu-Chek Softclix Lancets lancets 3 (three) times daily.     acetaminophen (TYLENOL) 325 MG tablet Take 650 mg by mouth every 6 (six) hours as needed for moderate pain.     ARIPiprazole (ABILIFY) 5 MG tablet Take 5 mg by mouth daily.     atenolol (TENORMIN) 50 MG tablet Take 50 mg by mouth daily.     baclofen (LIORESAL) 10 MG tablet Take 10 mg by mouth 3 (three) times daily as needed (Back pain).     Blood Glucose Monitoring Suppl (ACCU-CHEK GUIDE ME) w/Device KIT See admin instructions.     chlorthalidone (HYGROTON) 25 MG tablet Take 25 mg by mouth every morning.     Continuous Blood Gluc Sensor (FREESTYLE LIBRE 3 SENSOR) MISC Place 1 sensor on the skin every 14 days. Use to check glucose continuously 6 each 1   DULoxetine (CYMBALTA) 60 MG capsule Take 60 mg by mouth daily in the afternoon.     gabapentin (NEURONTIN) 300 MG capsule Take 300 mg by mouth at bedtime.     insulin glargine (LANTUS) 100 UNIT/ML injection Inject 42 Units into the skin daily.     insulin lispro (HUMALOG) 100 UNIT/ML injection Inject 6-12 Units into the skin 3 (three) times daily with meals.     insulin lispro (HUMALOG) 100 UNIT/ML KwikPen SMARTSIG:3 Unit(s) SUB-Q 3 Times Daily     lisinopril (ZESTRIL) 40 MG tablet Take 40 mg by mouth daily.     sucralfate (CARAFATE) 1 g tablet Take 1 g by mouth daily as needed (Acid reflux).     famotidine (PEPCID) 40 MG tablet Take 1  tablet (40 mg total) by mouth daily. 90 tablet 1   meclizine (ANTIVERT) 25 MG tablet Take 25 mg by mouth 3 (three) times daily as needed for dizziness. (Patient not taking: Reported on 09/10/2022)     omeprazole (PRILOSEC) 40 MG capsule Take 1 capsule (40 mg total) by mouth daily. 30 capsule 11   No current facility-administered medications for this visit.    Past Medical History:  Diagnosis Date   Abnormal Pap smear of cervix    Anemia    issue with red blood cells being small   Anxiety    Arthritis    Carpal tunnel syndrome    right   Depression    Diabetes mellitus without complication (HCC)    DVT (deep venous thrombosis) (HCC) 03/2021   left arm   GERD (gastroesophageal reflux disease)    High blood pressure    History of hiatal hernia    Left breast mass    Pre-diabetes     Past Surgical History:  Procedure Laterality Date   BIOPSY  02/05/2022   Procedure: BIOPSY;  Surgeon: Lanelle Bal, DO;  Location: [facility];  Service: Endoscopy;;   COLONOSCOPY  2022   Megan Straughan: 7 polyps removed, has to return in one year   COLONOSCOPY  09/2021   Megan Straughan: 12 polyps removed   ESOPHAGOGASTRODUODENOSCOPY (EGD) WITH PROPOFOL N/A 02/05/2022   Procedure: ESOPHAGOGASTRODUODENOSCOPY (EGD) WITH PROPOFOL;  Surgeon: Lanelle Bal, DO;  Location: [facility];  Service: Endoscopy;  Laterality: N/A;  2:00pm   left shoulder arthroscopy  12/2020   SHOULDER ARTHROSCOPY WITH OPEN ROTATOR CUFF REPAIR AND DISTAL CLAVICLE ACROMINECTOMY Left 03/21/2022   Procedure: LEFT SHOULDER ARTHOSCOPY, DEBRIDEMENT,  MANIPULATION, ROTATOR CUFF REPAIR, ARTHROSCOPIC DISTAL CLAVICLE EXCISION;  Surgeon: Cammy Copa,  MD;  Location: [facility] OR;  Service: Orthopedics;  Laterality: Left;   TUBAL LIGATION      Family History  Problem Relation Age of Onset   Lung cancer Mother    Other Father        multiple issues, doesn't know history for sure   Prostate cancer Father    Stroke Father     Aneurysm Paternal Grandmother    Breast cancer Maternal Aunt    Prostate cancer Maternal Uncle    Alcoholism Maternal Uncle    Aneurysm Cousin        paternal side   Aneurysm Cousin        paternal side    Breast cancer Cousin        maternal side    High blood pressure Other        father's side    Cerebral aneurysm Other        cerebral, on father's side    Alcoholism Other        maternal side    Colon cancer Neg Hx     Allergies as of 09/10/2022 - Review Complete 09/10/2022  Allergen Reaction Noted   Ferumoxytol Other (See Comments) 12/18/2021   Hydrochlorothiazide Other (See Comments) 04/02/2017    Social History   Socioeconomic History   Marital status: Single    Spouse name: Not on file   Number of children: 3   Years of education: Not on file   Highest education level: Some college, no degree  Occupational History   Not on file  Tobacco Use   Smoking status: Every Day    Packs/day: 0.50    Types: Cigarettes   Smokeless tobacco: Never   Tobacco comments:    Working on cutting down on smoking. Down to 8 cigarrettes a day now.  Vaping Use   Vaping Use: Never used  Substance and Sexual Activity   Alcohol use: Not Currently   Drug use: Not Currently   Sexual activity: Yes    Birth control/protection: None  Other Topics Concern   Not on file  Social History Narrative   Lives at home with 2 daughters   Right handed   Caffeine: about 2-3 per day    Social Determinants of Health   Financial Resource Strain: Not on file  Food Insecurity: Not on file  Transportation Needs: Not on file  Physical Activity: Not on file  Stress: Stress Concern Present (08/22/2022)   [employer] of Occupational Health - Occupational Stress Questionnaire    Feeling of Stress : Very much  Social Connections: Not on file     Review of Systems   Gen: Denies fever, chills, anorexia. Denies fatigue, weakness, weight loss.  CV: Denies chest pain, palpitations, syncope,  peripheral edema, and claudication. Resp: Denies dyspnea at rest, cough, wheezing, coughing up blood, and pleurisy. GI: See HPI Derm: Denies rash, itching, dry skin Psych: Denies depression, anxiety, memory loss, confusion. No homicidal or suicidal ideation.  Heme: Denies bruising, bleeding, and enlarged lymph nodes.   Physical Exam   BP (!) 196/94 (BP Location: Right Arm, Patient Position: Sitting, Cuff Size: Large) Comment: BP recheck  Pulse (!) 58   Temp 97.6 F (36.4 C) (Temporal)   Ht 5\' 1"  (1.549 m)   Wt 175 lb 6.4 oz (79.6 kg)   SpO2 97%   BMI 33.14 kg/m   General:   Alert and oriented. No distress noted. Pleasant and cooperative.  Head:  Normocephalic and atraumatic. Eyes:  Conjuctiva clear without scleral icterus. Mouth:  Oral mucosa pink and moist. Good dentition. No lesions. Lungs:  Clear to auscultation bilaterally. No wheezes, rales, or rhonchi. No distress.  Heart:  S1, S2 present without murmurs appreciated.  Abdomen:  +BS, soft, non-tender and non-distended. No rebound or guarding. No HSM or masses noted. Rectal: deferred Msk:  Symmetrical without gross deformities. Normal posture. Extremities:  Without edema. Neurologic:  Alert and  oriented x4 Psych:  Alert and cooperative. Normal mood and affect.   Assessment  Suzanne Lawrence is a 52 y.o. female with a history of anxiety, diabetes, GERD, DVT, HTN, arthritis, depression, GERD presenting today follow up and to discuss abdominal imaging findings.   GERD: On omeprazole 40 mg daily and famotidine 40 mg nightly.  Has preparatory symptoms and will use Carafate as needed for breakthrough.  Famotidine and omeprazole refilled today.  Anemia: Had been feeling well since her endometrial ablation in March 2023 up until recently.  Most recent labs at the end of December with hemoglobin 14.9.  Hemoglobin 08/11/2022 elevated at 15.4 likely secondary to hemoconcentration as her glucose was 682 at that time.  Follows with [facility] who she seen yesterday 09/09/2022.  Plan to recheck labs in 6 months and continue to follow for 1 year and if stable would discharge from heme-onc.  Normal iron and ferritin 08/07/2022.  EGD and colonoscopy up-to-date.  Diarrhea: Reported diarrhea couple months prior with her stools being shaped like polyps or capsules and was having 6-7 BMs per day however this has resolved.  During this time period she had right upper quadrant ultrasound revealing hepatomegaly and hepatic steatosis without discrete hepatic mass.  No evidence of gallbladder disease.  Suspect diarrhea secondary to uncontrolled blood sugars or possible viral illness.  No prior stool studies performed.  Will continue to monitor.  Hypertension: BP 192/93 on 2 separate checks..  A third recheck of her blood pressure revealed 196/94.  She has been having slight headaches as well as double vision and blurry vision not relieved by her prescription glasses.  I discussed with her the risk of persistently elevated BP including stroke and heart attack. I personally advised to the patient to follow up closely with PCP for hypertension control and if she was not able to be in touch with PCP regarding recommendations today that she should proceed to urgent care or ED for further treatment.  She states she is due for follow-up with PCP in 6 weeks, no recent medication changes at her visit last week.   PLAN   Follow-up with PCP regarding hypertension and elevated blood sugars Famotidine 40 mg nightly, refilled today Omeprazole 40 mg daily, refilled today GERD diet/lifestyle modifications Continue Carafate 1g as needed.  Follow up with PCP regarding BP management, call today. Would recommend urgent care or ED if you continue to have double vision and headache as BP is very elevated.  Follow up in 6 months.    Brooke Bonito, MSN, FNP-BC, AGACNP-BC [facility]

## 2022-09-09 NOTE — Patient Instructions (Signed)
[facility] [place] **   You were seen today by Tarri Abernethy PA-C for your follow-up visit.    IRON DEFICIENCY ANEMIA: Your iron levels look great!  You do not need any extra iron right now.  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 However, you also have a normal copy of that same piece of DNA, so your body is able to compensate with plenty of healthy hemoglobin protein to make up for the abnormal protein. This condition will NOT affect your own health, but could have implications for your children and grandchildren, especially regarding reproductive concerns, as a child who inherited 2 broken copies of this DNA could have severe anemia at birth, or lead to pregnancy complications.  ELEVATED HEMOGLOBIN Your elevated hemoglobin is likely due to your tobacco use.  Continue to work on smoking cessation! We will also send you for sleep study, because sleep apnea can also cause elevated hemoglobin.  FOLLOW-UP APPOINTMENT: Labs in 6 months with office visit after  ** Thank you for trusting me with your healthcare!  I strive to provide all of my patients with quality care at each visit.  If you receive a survey for this visit, I would be so grateful to you for taking the time to provide feedback.  Thank you in advance!  ~ Meira Wahba                   Dr. Derek Jack   &   Tarri Abernethy, PA-C   - - - - - - - - - - - - - - - - - -    Thank you for choosing [facility] at [facility] to provide your oncology and hematology care.  To afford each patient quality time with our provider, please arrive at least 15 minutes before your scheduled appointment time.   If you have a lab appointment with the [facility] please come in thru  the Main Entrance and check in at the main information desk.  You need to re-schedule your appointment should you arrive 10 or more minutes late.  We strive to give you quality time with our providers, and arriving late affects you and other patients whose appointments are after yours.  Also, if you no show three or more times for appointments you may be dismissed from the clinic at the providers discretion.     Again, thank you for choosing [facility].  Our hope is that these requests will decrease the amount of time that you wait before being seen by our physicians.       _____________________________________________________________  Should you have questions after your visit to [facility], please contact our office at [phone] and follow the prompts.  Our office hours are 8:00 a.m. and 4:30 p.m. Monday - Friday.  Please note that voicemails left after 4:00 p.m. may not be returned until the following business day.  We are closed weekends and major holidays.  You do have access to a nurse 24-7, just call the main number to the clinic [phone] and do not press any options, hold on the line and a nurse will answer the phone.    For prescription refill requests, have your pharmacy contact our office and allow 72 hours.

## 2022-09-09 NOTE — Progress Notes (Signed)
Nutrition Assessment   Reason for Assessment: +MST   ASSESSMENT: 52 year old female with IDA due to chronic blood loss. She is receiving intermittent iron infusion with faraheme as needed. She is under the care of Dr. Delton Coombes  Met with patient after PA-C office visit. Patient reports visit went well and pleased to learn she will not need infusion. Patient reports recent unintentional weight loss with poor appetite and increased fatigue ongoing the last few months. Patient endorses poorly controlled DM2. Noted she was seen in ED for hyperglycemia on 3 occasions in December. Patient reports she is working to improve this. She reports working currently with RD at [facility] feels she is making better food choices. Patient reports recently meeting with endo provider who recommended intermittent fasting diet (6AM-8AM, 12PM-2PM, 5PM-7PM). Patient reports following schedule. She feels more tired and is constantly hungry. She recalls first meal is normally bowl of cheerios with unsweet almond milk. Salad and baked potato for lunch. Patient having pintos, cabbage, mac/cheese for dinner. She does not eat pork, chicken, fish. Patient is not snacking in between meals. She is concerned about being able to maintain schedule when she returns to work on 12 hour shifts. Patient is working to increase activity, but this is hard with low energy. Patient endorses some type of body movement ~15 minutes daily  Nutrition Focused Physical Exam: deferred   Medications: zestril, lantus, humalog, gabapentin, pepcid, cymbalta, tenormin, abilify   Labs: 1/8 A1c - 12.6   Anthropometrics:   Height: 5'1" Weight: 172 lb 11.2 oz UBW:  BMI: 32.63   NUTRITION DIAGNOSIS: Food and nutrition related knowledge deficit related to uncontrolled DM2 as evidenced by A1c 12.6  INTERVENTION:  Educated about A1c and effects of constantly high blood sugars within body Discussed symptoms of high blood sugars  - including fatigue Do not feel oral intake is sufficient - encouraged increased fiber to promote satiety as well as protein source with all meals/snacks. Suggested pt include balanced snacks/ONS in between meals - ideas + glucerna high protein samples given  Pt followed by outpt RD - encouraged pt to discuss concerns and desires in making sustainable lifestyle changes  Contact information provided   MONITORING, EVALUATION, GOAL: Patient will manage DM2 and lower A1c with sustainable lifestyle changes   Next Visit: To be scheduled as needed - pt has contact information

## 2022-09-10 ENCOUNTER — Ambulatory Visit (INDEPENDENT_AMBULATORY_CARE_PROVIDER_SITE_OTHER): Payer: BC Managed Care – PPO | Admitting: Gastroenterology

## 2022-09-10 ENCOUNTER — Encounter: Payer: Self-pay | Admitting: Gastroenterology

## 2022-09-10 VITALS — BP 196/94 | HR 58 | Temp 97.6°F | Ht 61.0 in | Wt 175.4 lb

## 2022-09-10 DIAGNOSIS — D500 Iron deficiency anemia secondary to blood loss (chronic): Secondary | ICD-10-CM

## 2022-09-10 DIAGNOSIS — K219 Gastro-esophageal reflux disease without esophagitis: Secondary | ICD-10-CM | POA: Diagnosis not present

## 2022-09-10 DIAGNOSIS — I10 Essential (primary) hypertension: Secondary | ICD-10-CM

## 2022-09-10 MED ORDER — FAMOTIDINE 40 MG PO TABS: 40.0000 mg | ORAL_TABLET | Freq: Every day | ORAL | 1 refills | Status: DC

## 2022-09-10 MED ORDER — OMEPRAZOLE 40 MG PO CPDR: 40.0000 mg | DELAYED_RELEASE_CAPSULE | Freq: Every day | ORAL | 11 refills | Status: DC

## 2022-09-10 NOTE — Patient Instructions (Addendum)
For your reflux: -Continue omeprazole 40 mg daily, 30 minutes prior to breakfast.  Refill sent to your pharmacy today -Continue famotidine 40 mg nightly, refill sent to your pharmacy today. -Continue Carafate 1 g as needed for breakthrough symptoms. -Follow a GERD diet:  Avoid fried, fatty, greasy, spicy, citrus foods. Avoid caffeine and carbonated beverages. Avoid chocolate. Try eating 4-6 small meals a day rather than 3 large meals. Do not eat within 3 hours of laying down. Prop head of bed up on wood or bricks to create a 6 inch incline.  Continue to monitor for any black/tarry stools or bright red blood in her stools.  Please also notify if you become significantly lightheaded, dizzy, or worsening shortness of breath with activity.  Your most recent hemoglobin was stable.  Regarding your blood pressure and elevated blood sugars I recommend you continue to follow with your primary care as well as endocrinology. Your blood pressure is quite elevated today and I suspect that is why you are having headaches and changes in your vision.  I would recommend you contact your PCP today regarding any recommendation for medication change and if unable to see your primary care or get recommendations that you follow-up with urgent care or the ED regarding your symptoms as you are at high risk for stroke or heart attack with the level your blood pressure is at.  Please ensure you are staying hydrated.  We will plan to see you for your reflux and anemia in 6 months, or sooner if needed.  It was a pleasure to see you today. I want to create trusting relationships with patients. If you receive a survey regarding your visit,  I greatly appreciate you taking time to fill this out on paper or through your MyChart. I value your feedback.  Venetia Night, MSN, FNP-BC, AGACNP-BC [facility]

## 2022-09-12 ENCOUNTER — Ambulatory Visit: Payer: BC Managed Care – PPO | Admitting: Orthopedic Surgery

## 2022-09-18 ENCOUNTER — Ambulatory Visit: Payer: BC Managed Care – PPO | Admitting: Orthopedic Surgery

## 2022-09-18 DIAGNOSIS — M25461 Effusion, right knee: Secondary | ICD-10-CM | POA: Diagnosis not present

## 2022-09-19 ENCOUNTER — Encounter: Payer: Self-pay | Admitting: Orthopedic Surgery

## 2022-09-19 NOTE — Progress Notes (Signed)
Office Visit Note   Patient: Suzanne Lawrence           Date of Birth: 07-18-1971           MRN: [id number] Visit Date: 09/18/2022 Requested by: Greeley Nation, MD [place],  [place] [id number] PCP: [place]ont Nation, MD  Subjective: Chief Complaint  Patient presents with   Right Knee - Follow-up    HPI: Suzanne Lawrence is a 52 y.o. female who presents to the office reporting left shoulder pain and right knee pain.  She states that she is doing reasonly well following her left shoulder arthroscopy and distal clavicle excision 03/21/2022.  Damp and cold weather does increase the pain to a slight degree.  She also returns for follow-up of right knee MRI scan.  That scan shows complex degenerative tearing of the anterior body and horn of the lateral meniscus.  Patellofemoral cartilage and medial sided cartilage intact.  Mild chondrosis on the lateral side.  Moderate joint effusion is present.  She did have an aspiration and injection in December.  Aleve is not helpful.  She does not want to repeat another injection.  She does describe mechanical symptoms in that knee..                ROS: All systems reviewed are negative as they relate to the chief complaint within the history of present illness.  Patient denies fevers or chills.  Assessment & Plan: Visit Diagnoses:  1. Effusion, right knee     Plan: Impression is lateral meniscal tear right knee.  Failure of conservative management.  Plan is right knee arthroscopy and debridement.  The risk and benefits of the procedure discussed with the patient include not limited to infection nerve vessel damage knee stiffness as well as incomplete pain relief and possible acceleration of arthritis in that compartment.  Patient understands the risk and benefits.  Depending on how much of that meniscal rim remains we will really determine how quickly she develops arthritis in the knee.  All questions answered.  Follow-Up Instructions: No  follow-ups on file.   Orders:  No orders of the defined types were placed in this encounter.  No orders of the defined types were placed in this encounter.     Procedures: No procedures performed   Clinical Data: No additional findings.  Objective: Vital Signs: There were no vitals taken for this visit.  Physical Exam:  Constitutional: Patient appears well-developed HEENT:  Head: Normocephalic Eyes:EOM are normal Neck: Normal range of motion Cardiovascular: Normal rate Pulmonary/chest: Effort normal Neurologic: Patient is alert Skin: Skin is warm Psychiatric: Patient has normal mood and affect  Ortho Exam: Ortho exam demonstrates mild effusion in that right knee.  Lateral greater than medial joint line tenderness.  No groin pain with internal/external Tatian of the leg.  No masses lymphadenopathy or skin changes noted in the right knee region.  Range of motion is full.  Specialty Comments:  No specialty comments available.  Imaging: No results found.   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 01/25/2020   High blood pressure    Depression  Carpal tunnel syndrome    Anxiety    Past Medical History:  Diagnosis Date   Abnormal Pap smear of cervix    Anemia    issue with red blood cells being small   Anxiety    Arthritis    Carpal tunnel syndrome    right   Depression    Diabetes mellitus without complication (HCC)    DVT (deep venous thrombosis) ([place]) 03/2021   left arm   GERD (gastroesophageal reflux disease)    High blood pressure    History of hiatal hernia    Left breast mass    Pre-diabetes     Family  History  Problem Relation Age of Onset   Lung cancer Mother    Other Father        multiple issues, doesn't know history for sure   Prostate cancer Father    Stroke Father    Aneurysm Paternal Grandmother    Breast cancer Maternal Aunt    Prostate cancer Maternal Uncle    Alcoholism Maternal Uncle    Aneurysm Cousin        paternal side   Aneurysm Cousin        paternal side    Breast cancer Cousin        maternal side    High blood pressure Other        father's side    Cerebral aneurysm Other        cerebral, on father's side    Alcoholism Other        maternal side    Colon cancer Neg Hx     Past Surgical History:  Procedure Laterality Date   BIOPSY  02/05/2022   Procedure: BIOPSY;  Surgeon: Eloise Harman, DO;  [facility];  Service: Endoscopy;;   COLONOSCOPY  2022   Megan Straughan: 7 polyps removed, has to return in one year   COLONOSCOPY  09/2021   Megan Straughan: 12 polyps removed   ESOPHAGOGASTRODUODENOSCOPY (EGD) WITH PROPOFOL N/A 02/05/2022   Procedure: ESOPHAGOGASTRODUODENOSCOPY (EGD) WITH PROPOFOL;  Surgeon: Eloise Harman, DO;  [facility];  Service: Endoscopy;  Laterality: N/A;  2:00pm   left shoulder arthroscopy  12/2020   SHOULDER ARTHROSCOPY WITH OPEN ROTATOR CUFF REPAIR AND DISTAL CLAVICLE ACROMINECTOMY Left 03/21/2022   Procedure: LEFT SHOULDER ARTHOSCOPY, DEBRIDEMENT,  MANIPULATION, ROTATOR CUFF REPAIR, ARTHROSCOPIC DISTAL CLAVICLE EXCISION;  Surgeon: Meredith Pel, MD;  Location: [facility];  Service: Orthopedics;  Laterality: Left;   TUBAL LIGATION     Social History   Occupational History   Not on file  Tobacco Use   Smoking status: Every Day    Packs/day: 0.50    Types: Cigarettes   Smokeless tobacco: Never   Tobacco comments:    Working on cutting down on smoking. Down to 8 cigarrettes a day now.  Vaping Use   Vaping Use: Never used  Substance and Sexual Activity   Alcohol use: Not Currently   Drug use: Not  Currently   Sexual activity: Yes    Birth control/protection: None

## 2022-09-24 ENCOUNTER — Telehealth: Payer: Self-pay | Admitting: Orthopedic Surgery

## 2022-09-24 NOTE — Telephone Encounter (Signed)
Pt returned call to [employer]. Please call pt at [phone]

## 2022-09-24 NOTE — Telephone Encounter (Signed)
Holding for you.  

## 2022-09-24 NOTE — Telephone Encounter (Signed)
Tried calling patient to discuss. No answer. Received greeting stating subscriber trying to reach was not available.  Will try to reach patient again at a later time prior to appointment.

## 2022-09-24 NOTE — Telephone Encounter (Signed)
Patient wants to know if she can drive to her appointment after her surgery on 10/07/2022  with Dr Marlou Sa. her post op appointment  is on 10/14/2022. [phone]

## 2022-09-26 NOTE — Telephone Encounter (Signed)
Tried calling back-unable to reach

## 2022-09-26 NOTE — Patient Instructions (Signed)

## 2022-09-30 ENCOUNTER — Ambulatory Visit: Payer: BC Managed Care – PPO | Admitting: Nutrition

## 2022-09-30 ENCOUNTER — Encounter: Payer: Self-pay | Admitting: Nurse Practitioner

## 2022-09-30 ENCOUNTER — Ambulatory Visit (INDEPENDENT_AMBULATORY_CARE_PROVIDER_SITE_OTHER): Payer: BC Managed Care – PPO | Admitting: Nurse Practitioner

## 2022-09-30 VITALS — BP 134/77 | HR 64 | Ht 61.0 in | Wt 172.4 lb

## 2022-09-30 DIAGNOSIS — E1165 Type 2 diabetes mellitus with hyperglycemia: Secondary | ICD-10-CM

## 2022-09-30 DIAGNOSIS — Z794 Long term (current) use of insulin: Secondary | ICD-10-CM | POA: Diagnosis not present

## 2022-09-30 LAB — POCT UA - MICROALBUMIN
Albumin/Creatinine Ratio, Urine, POC: >300
Creatinine, POC: 10 mg/dL
Microalbumin Ur, POC: 10 mg/L

## 2022-09-30 MED ORDER — FREESTYLE LIBRE 3 SENSOR MISC: 1 refills | Status: DC

## 2022-09-30 NOTE — Addendum Note (Signed)
Addended by: Brita Romp on: 09/30/2022 12:45 PM   Modules accepted: Orders

## 2022-09-30 NOTE — Progress Notes (Signed)
Endocrinology Follow Up Note       09/30/2022, 11:12 AM   Subjective:    Patient ID: Suzanne Lawrence, female    DOB: 1971/04/23.  Suzanne Lawrence is being seen in follow up after being seen in consultation for management of currently uncontrolled symptomatic diabetes requested by  Donetta Potts, MD.   Past Medical History:  Diagnosis Date   Abnormal Pap smear of cervix    Anemia    issue with red blood cells being small   Anxiety    Arthritis    Carpal tunnel syndrome    right   Depression    Diabetes mellitus without complication (HCC)    DVT (deep venous thrombosis) (HCC) 03/2021   left arm   GERD (gastroesophageal reflux disease)    High blood pressure    History of hiatal hernia    Left breast mass    Pre-diabetes     Past Surgical History:  Procedure Laterality Date   BIOPSY  02/05/2022   Procedure: BIOPSY;  Surgeon: Lanelle Bal, DO;  [facility];  Service: Endoscopy;;   COLONOSCOPY  2022   Megan Straughan: 7 polyps removed, has to return in one year   COLONOSCOPY  09/2021   Megan Straughan: 12 polyps removed   ESOPHAGOGASTRODUODENOSCOPY (EGD) WITH PROPOFOL N/A 02/05/2022   Procedure: ESOPHAGOGASTRODUODENOSCOPY (EGD) WITH PROPOFOL;  Surgeon: Lanelle Bal, DO;  [facility];  Service: Endoscopy;  Laterality: N/A;  2:00pm   left shoulder arthroscopy  12/2020   SHOULDER ARTHROSCOPY WITH OPEN ROTATOR CUFF REPAIR AND DISTAL CLAVICLE ACROMINECTOMY Left 03/21/2022   Procedure: LEFT SHOULDER ARTHOSCOPY, DEBRIDEMENT,  MANIPULATION, ROTATOR CUFF REPAIR, ARTHROSCOPIC DISTAL CLAVICLE EXCISION;  Surgeon: Cammy Copa, MD;  Location: [facility];  Service: Orthopedics;  Laterality: Left;   TUBAL LIGATION      Social History   Socioeconomic History   Marital status: Single    Spouse name: Not on file   Number of children: 3   Years of education: Not on file   Highest  education level: Some college, no degree  Occupational History   Not on file  Tobacco Use   Smoking status: Every Day    Packs/day: 0.50    Types: Cigarettes   Smokeless tobacco: Never   Tobacco comments:    Working on cutting down on smoking. Down to 8 cigarrettes a day now.  Vaping Use   Vaping Use: Never used  Substance and Sexual Activity   Alcohol use: Not Currently   Drug use: Not Currently   Sexual activity: Yes    Birth control/protection: None  Other Topics Concern   Not on file  Social History Narrative   Lives at home with 2 daughters   Right handed   Caffeine: about 2-3 per day    Social Determinants of Health   Financial Resource Strain: Not on file  Food Insecurity: Not on file  Transportation Needs: Not on file  Physical Activity: Not on file  Stress: Stress Concern Present (08/22/2022)   [employer] of Occupational Health - Occupational Stress Questionnaire    Feeling of Stress : Very much  Social Connections: Not on file  Family History  Problem Relation Age of Onset   Lung cancer Mother    Other Father        multiple issues, doesn't know history for sure   Prostate cancer Father    Stroke Father    Aneurysm Paternal Grandmother    Breast cancer Maternal Aunt    Prostate cancer Maternal Uncle    Alcoholism Maternal Uncle    Aneurysm Cousin        paternal side   Aneurysm Cousin        paternal side    Breast cancer Cousin        maternal side    High blood pressure Other        father's side    Cerebral aneurysm Other        cerebral, on father's side    Alcoholism Other        maternal side    Colon cancer Neg Hx     Outpatient Encounter Medications as of 09/30/2022  Medication Sig   ACCU-CHEK GUIDE test strip 3 (three) times daily.   Accu-Chek Softclix Lancets lancets 3 (three) times daily.   acetaminophen (TYLENOL) 325 MG tablet Take 650 mg by mouth every 6 (six) hours as needed for moderate pain.   ARIPiprazole (ABILIFY) 5  MG tablet Take 5 mg by mouth daily.   atenolol (TENORMIN) 50 MG tablet Take 50 mg by mouth daily.   baclofen (LIORESAL) 10 MG tablet Take 10 mg by mouth 3 (three) times daily as needed (Back pain).   Blood Glucose Monitoring Suppl (ACCU-CHEK GUIDE ME) w/Device KIT See admin instructions.   chlorthalidone (HYGROTON) 25 MG tablet Take 25 mg by mouth every morning.   Continuous Blood Gluc Sensor (FREESTYLE LIBRE 3 SENSOR) MISC Place 1 sensor on the skin every 14 days. Use to check glucose continuously   DULoxetine (CYMBALTA) 60 MG capsule Take 60 mg by mouth daily in the afternoon.   famotidine (PEPCID) 40 MG tablet Take 1 tablet (40 mg total) by mouth daily.   gabapentin (NEURONTIN) 300 MG capsule Take 300 mg by mouth at bedtime.   insulin glargine (LANTUS) 100 UNIT/ML injection Inject 35 Units into the skin daily.   insulin lispro (HUMALOG) 100 UNIT/ML injection Inject 6-12 Units into the skin 3 (three) times daily with meals.   lisinopril (ZESTRIL) 40 MG tablet Take 40 mg by mouth daily.   meclizine (ANTIVERT) 25 MG tablet Take 25 mg by mouth 3 (three) times daily as needed for dizziness.   omeprazole (PRILOSEC) 40 MG capsule Take 1 capsule (40 mg total) by mouth daily.   sucralfate (CARAFATE) 1 g tablet Take 1 g by mouth daily as needed (Acid reflux).   [DISCONTINUED] insulin lispro (HUMALOG) 100 UNIT/ML KwikPen SMARTSIG:3 Unit(s) SUB-Q 3 Times Daily   No facility-administered encounter medications on file as of 09/30/2022.    ALLERGIES: Allergies  Allergen Reactions   Ferumoxytol Other (See Comments)    Hot flash, nausea, chest pressure    Hydrochlorothiazide Other (See Comments)     "urinary frequency"; was going every 20 minutes and was causing problems at work    VACCINATION STATUS: Immunization History  Administered Date(s) Administered   [employer] Vaccination 07/06/2020    Diabetes She presents for her follow-up diabetic visit. She has type 2 diabetes mellitus.  Onset time: diagnosed at approx age of 36 (in June) Her disease course has been improving. There are no hypoglycemic associated symptoms. Associated symptoms include blurred vision, fatigue,  polydipsia, polyuria, visual change and weight loss. There are no hypoglycemic complications. Symptoms are resolved. There are no diabetic complications. Risk factors for coronary artery disease include diabetes mellitus, dyslipidemia, family history, obesity, hypertension, sedentary lifestyle and tobacco exposure. Current diabetic treatment includes intensive insulin program. She is compliant with treatment most of the time. Her weight is decreasing steadily. She is following a generally healthy diet. When asked about meal planning, she reported none. She has had a previous visit with a dietitian. She rarely participates in exercise. Her home blood glucose trend is decreasing steadily. Her overall blood glucose range is 140-180 mg/dl. (She presents today with her CGM showing at target glycemic profile overall.  She was not due for another A1c today.  Analysis of her CGM shows TIR 73%, TAR 27%, TBR 0% with a GMI of 7%.  She denies any significant hypoglycemia.  She reports her Elenor Legato 3 was too expensive for her thus she will not be able to continue.  She says the patient assistance program from [employer] was only for the [place] 2.  Also, [employer] was helping patient with patient assistance for her insulin and she has not yet heard anything back from them.) An ACE inhibitor/angiotensin II receptor blocker is being taken. She sees a podiatrist.Eye exam is not current.     Review of systems  Constitutional: + decreasing body weight, current Body mass index is 32.57 kg/m., no fatigue, no subjective hyperthermia, no subjective hypothermia Eyes: + blurry vision-improving, no xerophthalmia ENT: no sore throat, no nodules palpated in throat, no dysphagia/odynophagia, no hoarseness Cardiovascular: no chest pain, no shortness of  breath, no palpitations, no leg swelling Respiratory: no cough, no shortness of breath Gastrointestinal: no nausea/vomiting/diarrhea Musculoskeletal: no muscle/joint aches Skin: no rashes, no hyperemia Neurological: no tremors, no numbness, no tingling, no dizziness Psychiatric: no depression, no anxiety  Objective:     BP 134/77 (BP Location: Right Arm, Patient Position: Sitting, Cuff Size: Large)   Pulse 64   Ht 5\' 1"  (1.549 m)   Wt 172 lb 6.4 oz (78.2 kg)   BMI 32.57 kg/m   Wt Readings from Last 3 Encounters:  09/30/22 172 lb 6.4 oz (78.2 kg)  09/10/22 175 lb 6.4 oz (79.6 kg)  09/09/22 172 lb 11.2 oz (78.3 kg)     BP Readings from Last 3 Encounters:  09/30/22 134/77  09/10/22 (!) 196/94  09/09/22 (!) 180/92     Physical Exam- Limited  Constitutional:  Body mass index is 32.57 kg/m. , not in acute distress, normal state of mind Eyes:  EOMI, no exophthalmos Musculoskeletal: no gross deformities, strength intact in all four extremities, no gross restriction of joint movements Skin:  no rashes, no hyperemia Neurological: no tremor with outstretched hands    CMP ( most recent) CMP     Component Value Date/Time   NA 132 (L) 08/09/2022 1258   K 3.9 08/09/2022 1258   CL 95 (L) 08/09/2022 1258   CO2 24 08/09/2022 1258   GLUCOSE 625 (HH) 08/09/2022 1258   BUN 9 08/09/2022 1258   BUN 6 08/06/2022 0000   CREATININE 0.92 08/09/2022 1258   CALCIUM 9.3 08/09/2022 1258   ALBUMIN 5.3 (A) 08/02/2022 0000   AST 67 (A) 08/06/2022 0000   ALT 63 (A) 08/06/2022 0000   ALKPHOS 125 08/02/2022 0000   GFRNONAA >60 08/09/2022 1258     Diabetic Labs (most recent): Lab Results  Component Value Date   HGBA1C 12.6 (A) 09/02/2022   HGBA1C 7.6 06/03/2022  MICROALBUR 10 mg/L 09/30/2022     Lipid Panel ( most recent) Lipid Panel  No results found for: "CHOL", "TRIG", "HDL", "CHOLHDL", "VLDL", "LDLCALC", "LDLDIRECT", "LABVLDL"    No results found for: "TSH", "FREET4"        Assessment & Plan:   1) Type 2 diabetes mellitus with hyperglycemia, with long-term current use of insulin ([place])  She presents today with her CGM showing at target glycemic profile overall.  She was not due for another A1c today.  Analysis of her CGM shows TIR 73%, TAR 27%, TBR 0% with a GMI of 7%.  She denies any significant hypoglycemia.  She reports her Elenor Legato 3 was too expensive for her thus she will not be able to continue.  She says the patient assistance program from [employer] was only for the [place] 2.  Also, [employer] was helping patient with patient assistance for her insulin and she has not yet heard anything back from them.  - Pebble Botkin has currently uncontrolled symptomatic type 2 DM since 52 years of age.   -Recent labs reviewed.  - I had a long discussion with her about the progressive nature of diabetes and the pathology behind its complications. -her diabetes is not currently complicated but she remains at a high risk for more acute and chronic complications which include CAD, CVA, CKD, retinopathy, and neuropathy. These are all discussed in detail with her.  The following Lifestyle Medicine recommendations according to [facility]) were discussed and offered to patient and she agrees to start the journey:  A. Whole Foods, Plant-based plate comprising of fruits and vegetables, plant-based proteins, whole-grain carbohydrates was discussed in detail with the patient.   A list for source of those nutrients were also provided to the patient.  Patient will use only water or unsweetened tea for hydration. B.  The need to stay away from risky substances including alcohol, smoking; obtaining 7 to 9 hours of restorative sleep, at least 150 minutes of moderate intensity exercise weekly, the importance of healthy social connections,  and stress reduction techniques were discussed. C.  A full color page of  Calorie density of various food groups per pound  showing examples of each food groups was provided to the patient.  - Nutritional counseling repeated at each appointment due to patients tendency to fall back in to old habits.  - The patient admits there is a room for improvement in their diet and drink choices. -  Suggestion is made for the patient to avoid simple carbohydrates from their diet including Cakes, Sweet Desserts / Pastries, Ice Cream, Soda (diet and regular), Sweet Tea, Candies, Chips, Cookies, Sweet Pastries, Store Bought Juices, Alcohol in Excess of 1-2 drinks a day, Artificial Sweeteners, Coffee Creamer, and "Sugar-free" Products. This will help patient to have stable blood glucose profile and potentially avoid unintended weight gain.   - I encouraged the patient to switch to unprocessed or minimally processed complex starch and increased protein intake (animal or plant source), fruits, and vegetables.   - Patient is advised to stick to a routine mealtimes to eat 3 meals a day and avoid unnecessary snacks (to snack only to correct hypoglycemia).  - she has been seeing Jearld Fenton, RDN, CDE for diabetes education.  - I have approached her with the following individualized plan to manage her diabetes and patient agrees:   She is advised to lower her Lantus to 35 units SQ nightly and continue Humalog 6-12 units TID with meals if  glucose is above 90 and she is eating (Specific instructions on how to titrate insulin dosage based on glucose readings given to patient in writing).    -she is encouraged to continue using CGM to monitor glucose 4 times daily, before meals and before bed, and to call the clinic if she has readings less than 70 or above 300 for 3 tests in a row.  I did give her sample Libre 3 and will reach out to rep regarding cost concern.  - she is warned not to take insulin without proper monitoring per orders. - Adjustment parameters are given to her for hypo and hyperglycemia in writing.  - she will be considered  for incretin therapy as appropriate next visit.  - Specific targets for  A1c; LDL, HDL, and Triglycerides were discussed with the patient.  2) Blood Pressure /Hypertension:  her blood pressure is controlled to target.   she is advised to continue her current medications including Lisinopril 40 mg p.o. daily with breakfast, and Atenolol 50 mg po daily.  3) Lipids/Hyperlipidemia:    There is no recent lipid panel available to review, nor is she on any lipid lowering medications.  Will check lipid panel on subsequent visits.  4)  Weight/Diet:  her Body mass index is 32.57 kg/m.  -  clearly complicating her diabetes care.   she is a candidate for weight loss. I discussed with her the fact that loss of 5 - 10% of her  current body weight will have the most impact on her diabetes management.  Exercise, and detailed carbohydrates information provided  -  detailed on discharge instructions.  5) Chronic Care/Health Maintenance: -she is on ACEI/ARB and not on Statin medications and is encouraged to initiate and continue to follow up with Ophthalmology, Dentist, Podiatrist at least yearly or according to recommendations, and advised to QUIT SMOKING. I have recommended yearly flu vaccine and pneumonia vaccine at least every 5 years; moderate intensity exercise for up to 150 minutes weekly; and sleep for at least 7 hours a day.  - she is advised to maintain close follow up with Donetta Potts, MD for primary care needs, as well as her other providers for optimal and coordinated care.     I spent  37  minutes in the care of the patient today including review of labs from CMP, Lipids, Thyroid Function, Hematology (current and previous including abstractions from other facilities); face-to-face time discussing  her blood glucose readings/logs, discussing hypoglycemia and hyperglycemia episodes and symptoms, medications doses, her options of short and long term treatment based on the latest standards of  care / guidelines;  discussion about incorporating lifestyle medicine;  and documenting the encounter. Risk reduction counseling performed per USPSTF guidelines to reduce obesity and cardiovascular risk factors.     Please refer to Patient Instructions for Blood Glucose Monitoring and Insulin/Medications Dosing Guide"  in media tab for additional information. Please  also refer to " Patient Self Inventory" in the Media  tab for reviewed elements of pertinent patient history.  [name] participated in the discussions, expressed understanding, and voiced agreement with the above plans.  All questions were answered to her satisfaction. she is encouraged to contact clinic should she have any questions or concerns prior to her return visit.     Follow up plan: - Return in about 3 months (around 12/29/2022) for Diabetes F/U with A1c in office, No previsit labs, Bring meter and logs.   Ronny Bacon, FNP-BC [facility]  [address] [place], [place] [address] Phone: [phone] Fax: [phone]  09/30/2022, 11:12 AM

## 2022-10-02 ENCOUNTER — Telehealth: Payer: Self-pay | Admitting: Orthopedic Surgery

## 2022-10-02 NOTE — Telephone Encounter (Signed)
Patient cancelled right knee scope scheduled at [facility] on 10-07-22.  Stated she is unable to cough up $2000 on her surgery date. She states she cannot afford to go to private facility.  Will look for a surgery date at [facility] or [facility].

## 2022-10-03 ENCOUNTER — Ambulatory Visit: Payer: BC Managed Care – PPO | Admitting: Internal Medicine

## 2022-10-03 ENCOUNTER — Encounter (HOSPITAL_COMMUNITY): Payer: Self-pay | Admitting: Hematology

## 2022-10-03 ENCOUNTER — Telehealth: Payer: Self-pay | Admitting: Nurse Practitioner

## 2022-10-03 NOTE — Telephone Encounter (Signed)
Patient was called and made aware. She will reach back out to [place] if needed to after talking to [employer] again.

## 2022-10-03 NOTE — Telephone Encounter (Signed)
Ok thx.

## 2022-10-14 ENCOUNTER — Encounter: Payer: BC Managed Care – PPO | Admitting: Orthopedic Surgery

## 2022-10-18 ENCOUNTER — Encounter (HOSPITAL_BASED_OUTPATIENT_CLINIC_OR_DEPARTMENT_OTHER): Payer: Self-pay | Admitting: Orthopedic Surgery

## 2022-10-22 ENCOUNTER — Encounter (HOSPITAL_BASED_OUTPATIENT_CLINIC_OR_DEPARTMENT_OTHER)
Admission: RE | Admit: 2022-10-22 | Discharge: 2022-10-22 | Disposition: A | Discharge status: Home / Self Care | Payer: BC Managed Care – PPO | Source: Ambulatory Visit | Attending: Orthopedic Surgery | Admitting: Orthopedic Surgery

## 2022-10-22 ENCOUNTER — Other Ambulatory Visit: Payer: Self-pay

## 2022-10-22 DIAGNOSIS — Z01818 Encounter for other preprocedural examination: Secondary | ICD-10-CM | POA: Diagnosis present

## 2022-10-22 LAB — CBC
HCT: 46.7 % — ABNORMAL HIGH (ref 36.0–46.0)
Hemoglobin: 15.5 g/dL — ABNORMAL HIGH (ref 12.0–15.0)
MCH: 26.8 pg (ref 26.0–34.0)
MCHC: 33.2 g/dL (ref 30.0–36.0)
MCV: 80.8 fL (ref 80.0–100.0)
Platelets: 309 10*3/uL (ref 150–400)
RBC: 5.78 MIL/uL — ABNORMAL HIGH (ref 3.87–5.11)
RDW: 13.5 % (ref 11.5–15.5)
WBC: 8.7 10*3/uL (ref 4.0–10.5)
nRBC: 0 % (ref 0.0–0.2)

## 2022-10-22 LAB — BASIC METABOLIC PANEL
Anion gap: 10 (ref 5–15)
BUN: 9 mg/dL (ref 6–20)
CO2: 29 mmol/L (ref 22–32)
Calcium: 9.2 mg/dL (ref 8.9–10.3)
Chloride: 97 mmol/L — ABNORMAL LOW (ref 98–111)
Creatinine, Ser: 0.79 mg/dL (ref 0.44–1.00)
GFR, Estimated: >60 mL/min (ref >60)
Glucose, Bld: 99 mg/dL (ref 70–99)
Potassium: 3.8 mmol/L (ref 3.5–5.1)
Sodium: 136 mmol/L (ref 135–145)

## 2022-10-22 NOTE — Progress Notes (Signed)

## 2022-10-24 ENCOUNTER — Institutional Professional Consult (permissible substitution): Payer: BC Managed Care – PPO | Admitting: Neurology

## 2022-10-24 ENCOUNTER — Encounter: Payer: Self-pay | Admitting: Radiology

## 2022-10-27 NOTE — Anesthesia Preprocedure Evaluation (Signed)
Anesthesia Evaluation  Patient identified by MRN, date of birth, ID band Patient awake    Reviewed: Allergy & Precautions, H&P , NPO status , Patient's Chart, lab work & pertinent test results  Airway Mallampati: III  TM Distance: >3 FB Neck ROM: Full    Dental no notable dental hx. (+) Teeth Intact, Dental Advisory Given   Pulmonary Current Smoker and Patient abstained from smoking.   Pulmonary exam normal breath sounds clear to auscultation       Cardiovascular hypertension, Pt. on medications and Pt. on home beta blockers + DVT  Normal cardiovascular exam Rhythm:Regular Rate:Normal     Neuro/Psych  PSYCHIATRIC DISORDERS Anxiety Depression     Neuromuscular disease    GI/Hepatic Neg liver ROS, hiatal hernia,GERD  Medicated and Controlled,,  Endo/Other  diabetes, Well Controlled    Renal/GU negative Renal ROS  negative genitourinary   Musculoskeletal  (+) Arthritis ,    Abdominal  (+) + obese  Peds negative pediatric ROS (+)  Hematology negative hematology ROS (+) Blood dyscrasia, anemia   Anesthesia Other Findings   Reproductive/Obstetrics negative OB ROS hcg negative                             Anesthesia Physical Anesthesia Plan  ASA: 3  Anesthesia Plan: General   Post-op Pain Management: Tylenol PO (pre-op)* and Celebrex PO (pre-op)*   Induction: Intravenous  PONV Risk Score and Plan: 2 and Ondansetron, Dexamethasone and Treatment may vary due to age or medical condition  Airway Management Planned: Oral ETT and LMA  Additional Equipment: None  Intra-op Plan:   Post-operative Plan: Extubation in OR  Informed Consent: I have reviewed the patients History and Physical, chart, labs and discussed the procedure including the risks, benefits and alternatives for the proposed anesthesia with the patient or authorized representative who has indicated his/her understanding and  acceptance.     Dental advisory given  Plan Discussed with: CRNA and Anesthesiologist  Anesthesia Plan Comments:         Anesthesia Quick Evaluation

## 2022-10-28 ENCOUNTER — Ambulatory Visit (HOSPITAL_BASED_OUTPATIENT_CLINIC_OR_DEPARTMENT_OTHER)
Admission: RE | Admit: 2022-10-28 | Discharge: 2022-10-28 | Disposition: A | Discharge status: Home / Self Care | Payer: BC Managed Care – PPO | Attending: Orthopedic Surgery | Admitting: Orthopedic Surgery

## 2022-10-28 ENCOUNTER — Ambulatory Visit (HOSPITAL_BASED_OUTPATIENT_CLINIC_OR_DEPARTMENT_OTHER): Payer: BC Managed Care – PPO | Admitting: Anesthesiology

## 2022-10-28 ENCOUNTER — Encounter (HOSPITAL_BASED_OUTPATIENT_CLINIC_OR_DEPARTMENT_OTHER): Payer: Self-pay | Admitting: Orthopedic Surgery

## 2022-10-28 ENCOUNTER — Other Ambulatory Visit: Payer: Self-pay

## 2022-10-28 ENCOUNTER — Encounter (HOSPITAL_BASED_OUTPATIENT_CLINIC_OR_DEPARTMENT_OTHER)
Admission: RE | Disposition: A | Discharge status: Home / Self Care | Payer: Self-pay | Source: Home / Self Care | Attending: Orthopedic Surgery

## 2022-10-28 DIAGNOSIS — Z86718 Personal history of other venous thrombosis and embolism: Secondary | ICD-10-CM | POA: Insufficient documentation

## 2022-10-28 DIAGNOSIS — S83281D Other tear of lateral meniscus, current injury, right knee, subsequent encounter: Secondary | ICD-10-CM

## 2022-10-28 DIAGNOSIS — K219 Gastro-esophageal reflux disease without esophagitis: Secondary | ICD-10-CM | POA: Diagnosis not present

## 2022-10-28 DIAGNOSIS — M2540 Effusion, unspecified joint: Secondary | ICD-10-CM | POA: Insufficient documentation

## 2022-10-28 DIAGNOSIS — F17210 Nicotine dependence, cigarettes, uncomplicated: Secondary | ICD-10-CM | POA: Insufficient documentation

## 2022-10-28 DIAGNOSIS — S83281A Other tear of lateral meniscus, current injury, right knee, initial encounter: Secondary | ICD-10-CM | POA: Insufficient documentation

## 2022-10-28 DIAGNOSIS — Z01818 Encounter for other preprocedural examination: Secondary | ICD-10-CM

## 2022-10-28 DIAGNOSIS — E669 Obesity, unspecified: Secondary | ICD-10-CM | POA: Insufficient documentation

## 2022-10-28 DIAGNOSIS — E119 Type 2 diabetes mellitus without complications: Secondary | ICD-10-CM | POA: Diagnosis not present

## 2022-10-28 DIAGNOSIS — Z6833 Body mass index (BMI) 33.0-33.9, adult: Secondary | ICD-10-CM | POA: Diagnosis not present

## 2022-10-28 DIAGNOSIS — I10 Essential (primary) hypertension: Secondary | ICD-10-CM | POA: Insufficient documentation

## 2022-10-28 DIAGNOSIS — M94261 Chondromalacia, right knee: Secondary | ICD-10-CM | POA: Insufficient documentation

## 2022-10-28 DIAGNOSIS — Z794 Long term (current) use of insulin: Secondary | ICD-10-CM | POA: Diagnosis not present

## 2022-10-28 DIAGNOSIS — X58XXXA Exposure to other specified factors, initial encounter: Secondary | ICD-10-CM | POA: Insufficient documentation

## 2022-10-28 HISTORY — PX: KNEE ARTHROSCOPY: SHX127

## 2022-10-28 LAB — GLUCOSE, CAPILLARY
Glucose-Capillary: 177 mg/dL — ABNORMAL HIGH (ref 70–99)
Glucose-Capillary: 184 mg/dL — ABNORMAL HIGH (ref 70–99)

## 2022-10-28 SURGERY — ARTHROSCOPY, KNEE
Anesthesia: General | Site: Knee | Laterality: Right

## 2022-10-28 MED ORDER — CLONIDINE HCL (ANALGESIA) 100 MCG/ML EP SOLN
EPIDURAL | Status: AC
  Filled 2022-10-28: qty 10

## 2022-10-28 MED ORDER — ACETAMINOPHEN 500 MG PO TABS
ORAL_TABLET | ORAL | Status: AC
  Filled 2022-10-28: qty 2

## 2022-10-28 MED ORDER — MORPHINE SULFATE (PF) 4 MG/ML IV SOLN
INTRAVENOUS | Status: DC | PRN
  Administered 2022-10-28: 8 mg via INTRAVENOUS

## 2022-10-28 MED ORDER — OXYCODONE HCL 5 MG PO TABS: 5.0000 mg | ORAL_TABLET | ORAL | 0 refills | Status: DC | PRN

## 2022-10-28 MED ORDER — OXYCODONE HCL 5 MG PO TABS
5.0000 mg | ORAL_TABLET | Freq: Once | ORAL | Status: AC | PRN
  Administered 2022-10-28: 5 mg via ORAL

## 2022-10-28 MED ORDER — FENTANYL CITRATE (PF) 100 MCG/2ML IJ SOLN
INTRAMUSCULAR | Status: AC
  Filled 2022-10-28: qty 2

## 2022-10-28 MED ORDER — LIDOCAINE HCL (CARDIAC) PF 100 MG/5ML IV SOSY
PREFILLED_SYRINGE | INTRAVENOUS | Status: DC | PRN
  Administered 2022-10-28: 100 mg via INTRAVENOUS

## 2022-10-28 MED ORDER — MIDAZOLAM HCL 5 MG/5ML IJ SOLN
INTRAMUSCULAR | Status: DC | PRN
  Administered 2022-10-28: 2 mg via INTRAVENOUS

## 2022-10-28 MED ORDER — MORPHINE SULFATE (PF) 4 MG/ML IV SOLN
INTRAVENOUS | Status: AC
  Filled 2022-10-28: qty 2

## 2022-10-28 MED ORDER — ACETAMINOPHEN 325 MG PO TABS: 325.0000 mg | ORAL_TABLET | ORAL | Status: DC | PRN

## 2022-10-28 MED ORDER — LIDOCAINE 2% (20 MG/ML) 5 ML SYRINGE
INTRAMUSCULAR | Status: AC
  Filled 2022-10-28: qty 5

## 2022-10-28 MED ORDER — FENTANYL CITRATE (PF) 100 MCG/2ML IJ SOLN
INTRAMUSCULAR | Status: DC | PRN
  Administered 2022-10-28: 100 ug via INTRAVENOUS
  Administered 2022-10-28: 50 ug via INTRAVENOUS

## 2022-10-28 MED ORDER — POVIDONE-IODINE 10 % EX SWAB
2.0000 | Freq: Once | CUTANEOUS | Status: AC
  Administered 2022-10-28: 2 via TOPICAL

## 2022-10-28 MED ORDER — BUPIVACAINE-EPINEPHRINE (PF) 0.25% -1:200000 IJ SOLN
INTRAMUSCULAR | Status: AC
  Filled 2022-10-28: qty 60

## 2022-10-28 MED ORDER — CEFAZOLIN SODIUM-DEXTROSE 2-4 GM/100ML-% IV SOLN
INTRAVENOUS | Status: AC
  Filled 2022-10-28: qty 100

## 2022-10-28 MED ORDER — CLONIDINE HCL (ANALGESIA) 100 MCG/ML EP SOLN
EPIDURAL | Status: DC | PRN
  Administered 2022-10-28: .5 mL via INTRA_ARTICULAR

## 2022-10-28 MED ORDER — ASPIRIN 81 MG PO CHEW: 81.0000 mg | CHEWABLE_TABLET | Freq: Two times a day (BID) | ORAL | 0 refills | Status: AC

## 2022-10-28 MED ORDER — DEXAMETHASONE SODIUM PHOSPHATE 4 MG/ML IJ SOLN
INTRAMUSCULAR | Status: DC | PRN
  Administered 2022-10-28: 8 mg via INTRAVENOUS

## 2022-10-28 MED ORDER — HYDRALAZINE HCL 20 MG/ML IJ SOLN
10.0000 mg | Freq: Once | INTRAMUSCULAR | Status: AC
  Administered 2022-10-28: 10 mg via INTRAVENOUS

## 2022-10-28 MED ORDER — OXYCODONE HCL 5 MG/5ML PO SOLN: 5.0000 mg | Freq: Once | ORAL | Status: AC | PRN

## 2022-10-28 MED ORDER — SODIUM CHLORIDE 0.9 % IR SOLN: Status: DC | PRN

## 2022-10-28 MED ORDER — HYDRALAZINE HCL 20 MG/ML IJ SOLN
INTRAMUSCULAR | Status: AC
  Filled 2022-10-28: qty 1

## 2022-10-28 MED ORDER — PROPOFOL 500 MG/50ML IV EMUL
INTRAVENOUS | Status: AC
  Filled 2022-10-28: qty 50

## 2022-10-28 MED ORDER — ONDANSETRON HCL 4 MG/2ML IJ SOLN
INTRAMUSCULAR | Status: DC | PRN
  Administered 2022-10-28: 4 mg via INTRAVENOUS

## 2022-10-28 MED ORDER — MIDAZOLAM HCL 2 MG/2ML IJ SOLN
INTRAMUSCULAR | Status: AC
  Filled 2022-10-28: qty 2

## 2022-10-28 MED ORDER — FENTANYL CITRATE (PF) 100 MCG/2ML IJ SOLN
25.0000 ug | INTRAMUSCULAR | Status: DC | PRN
  Administered 2022-10-28: 50 ug via INTRAVENOUS

## 2022-10-28 MED ORDER — ACETAMINOPHEN 500 MG PO TABS
1000.0000 mg | ORAL_TABLET | Freq: Once | ORAL | Status: AC
  Administered 2022-10-28: 1000 mg via ORAL

## 2022-10-28 MED ORDER — MEPERIDINE HCL 25 MG/ML IJ SOLN: 6.2500 mg | INTRAMUSCULAR | Status: DC | PRN

## 2022-10-28 MED ORDER — OXYCODONE HCL 5 MG PO TABS
ORAL_TABLET | ORAL | Status: AC
  Filled 2022-10-28: qty 1

## 2022-10-28 MED ORDER — ONDANSETRON HCL 4 MG/2ML IJ SOLN: 4.0000 mg | Freq: Once | INTRAMUSCULAR | Status: DC | PRN

## 2022-10-28 MED ORDER — ONDANSETRON HCL 4 MG/2ML IJ SOLN
INTRAMUSCULAR | Status: AC
  Filled 2022-10-28: qty 2

## 2022-10-28 MED ORDER — CEFAZOLIN SODIUM-DEXTROSE 2-4 GM/100ML-% IV SOLN
2.0000 g | INTRAVENOUS | Status: AC
  Administered 2022-10-28: 2 g via INTRAVENOUS

## 2022-10-28 MED ORDER — POVIDONE-IODINE 7.5 % EX SOLN
Freq: Once | CUTANEOUS | Status: AC
  Filled 2022-10-28: qty 118

## 2022-10-28 MED ORDER — PROPOFOL 10 MG/ML IV BOLUS
INTRAVENOUS | Status: DC | PRN
  Administered 2022-10-28: 150 mg via INTRAVENOUS

## 2022-10-28 MED ORDER — LACTATED RINGERS IV SOLN
INTRAVENOUS | Status: DC | PRN
  Administered 2022-10-28: 1000 mL via INTRAVENOUS

## 2022-10-28 MED ORDER — DEXAMETHASONE SODIUM PHOSPHATE 10 MG/ML IJ SOLN
INTRAMUSCULAR | Status: AC
  Filled 2022-10-28: qty 1

## 2022-10-28 MED ORDER — EPINEPHRINE PF 1 MG/ML IJ SOLN
INTRAMUSCULAR | Status: AC
  Filled 2022-10-28: qty 8

## 2022-10-28 MED ORDER — ACETAMINOPHEN 160 MG/5ML PO SOLN: 325.0000 mg | ORAL | Status: DC | PRN

## 2022-10-28 MED ORDER — BUPIVACAINE-EPINEPHRINE 0.25% -1:200000 IJ SOLN
INTRAMUSCULAR | Status: DC | PRN
  Administered 2022-10-28: 30 mL

## 2022-10-28 SURGICAL SUPPLY — 53 items
BANDAGE ESMARK 6X9 LF (GAUZE/BANDAGES/DRESSINGS) IMPLANT
BLADE EXCALIBUR 4.0X13 (MISCELLANEOUS) IMPLANT
BLADE SHAVER TORPEDO 4X13 (MISCELLANEOUS) IMPLANT
BLADE SURG 15 STRL LF DISP TIS (BLADE) IMPLANT
BLADE SURG 15 STRL SS (BLADE)
BNDG CMPR 5X62 HK CLSR LF (GAUZE/BANDAGES/DRESSINGS) ×1
BNDG CMPR 6"X 5 YARDS HK CLSR (GAUZE/BANDAGES/DRESSINGS) ×1
BNDG CMPR 9X6 STRL LF SNTH (GAUZE/BANDAGES/DRESSINGS)
BNDG ELASTIC 4X5.8 VLCR STR LF (GAUZE/BANDAGES/DRESSINGS) ×1 IMPLANT
BNDG ELASTIC 6INX 5YD STR LF (GAUZE/BANDAGES/DRESSINGS) ×1 IMPLANT
BNDG ESMARK 6X9 LF (GAUZE/BANDAGES/DRESSINGS)
CUFF TOURN SGL QUICK 34 (TOURNIQUET CUFF)
CUFF TRNQT CYL 34X4.125X (TOURNIQUET CUFF) IMPLANT
DISSECTOR  3.8MM X 13CM (MISCELLANEOUS) ×1
DISSECTOR 3.8MM X 13CM (MISCELLANEOUS) ×1 IMPLANT
DRAPE ARTHROSCOPY W/POUCH 90 (DRAPES) ×1 IMPLANT
DRAPE INCISE IOBAN 66X45 STRL (DRAPES) IMPLANT
DRAPE U-SHAPE 47X51 STRL (DRAPES) ×1 IMPLANT
DRSG TEGADERM 4X4.75 (GAUZE/BANDAGES/DRESSINGS) ×2 IMPLANT
DURAPREP 26ML APPLICATOR (WOUND CARE) ×1 IMPLANT
DW OUTFLOW CASSETTE/TUBE SET (MISCELLANEOUS) ×1 IMPLANT
ELECT REM PT RETURN 9FT ADLT (ELECTROSURGICAL) ×1
ELECTRODE REM PT RTRN 9FT ADLT (ELECTROSURGICAL) IMPLANT
EXCALIBUR 3.8MM X 13CM (MISCELLANEOUS) IMPLANT
GAUZE 4X4 16PLY ~~LOC~~+RFID DBL (SPONGE) IMPLANT
GAUZE SPONGE 4X4 12PLY STRL (GAUZE/BANDAGES/DRESSINGS) ×1 IMPLANT
GAUZE XEROFORM 1X8 LF (GAUZE/BANDAGES/DRESSINGS) ×1 IMPLANT
GLOVE BIO SURGEON STRL SZ7 (GLOVE) ×1 IMPLANT
GLOVE BIO SURGEON STRL SZ8 (GLOVE) ×1 IMPLANT
GLOVE BIOGEL PI IND STRL 7.0 (GLOVE) ×1 IMPLANT
GLOVE BIOGEL PI IND STRL 8 (GLOVE) ×1 IMPLANT
GOWN STRL REUS W/ TWL LRG LVL3 (GOWN DISPOSABLE) ×2 IMPLANT
GOWN STRL REUS W/ TWL XL LVL3 (GOWN DISPOSABLE) ×1 IMPLANT
GOWN STRL REUS W/TWL LRG LVL3 (GOWN DISPOSABLE) ×2
GOWN STRL REUS W/TWL XL LVL3 (GOWN DISPOSABLE) ×1
MANIFOLD NEPTUNE II (INSTRUMENTS) ×1 IMPLANT
NDL HYPO 18GX1.5 BLUNT FILL (NEEDLE) ×1 IMPLANT
NDL SAFETY ECLIP 18X1.5 (MISCELLANEOUS) ×2 IMPLANT
NEEDLE HYPO 18GX1.5 BLUNT FILL (NEEDLE) ×1 IMPLANT
PACK ARTHROSCOPY DSU (CUSTOM PROCEDURE TRAY) ×1 IMPLANT
PACK BASIN DAY SURGERY FS (CUSTOM PROCEDURE TRAY) ×1 IMPLANT
PADDING CAST COTTON 6X4 STRL (CAST SUPPLIES) ×1 IMPLANT
PENCIL SMOKE EVACUATOR (MISCELLANEOUS) IMPLANT
PORT APPOLLO RF 90DEGREE MULTI (SURGICAL WAND) IMPLANT
SUT ETHILON 3 0 PS 1 (SUTURE) ×1 IMPLANT
SUT VIC AB 2-0 SH 27 (SUTURE)
SUT VIC AB 2-0 SH 27XBRD (SUTURE) IMPLANT
SYR 5ML LL (SYRINGE) ×1 IMPLANT
SYR TB 1ML LL NO SAFETY (SYRINGE) ×1 IMPLANT
TOWEL GREEN STERILE FF (TOWEL DISPOSABLE) ×1 IMPLANT
TRAY DSU PREP LF (CUSTOM PROCEDURE TRAY) ×1 IMPLANT
TUBING ARTHROSCOPY IRRIG 16FT (MISCELLANEOUS) ×1 IMPLANT
WRAP KNEE MAXI GEL POST OP (GAUZE/BANDAGES/DRESSINGS) ×1 IMPLANT

## 2022-10-28 NOTE — Discharge Instructions (Signed)
  Post Anesthesia Home Care Instructions  Activity: Get plenty of rest for the remainder of the day. A responsible individual must stay with you for 24 hours following the procedure.  For the next 24 hours, DO NOT: -Drive a car -Paediatric nurse -Drink alcoholic beverages -Take any medication unless instructed by your physician -Make any legal decisions or sign important papers.  Meals: Start with liquid foods such as gelatin or soup. Progress to regular foods as tolerated. Avoid greasy, spicy, heavy foods. If nausea and/or vomiting occur, drink only clear liquids until the nausea and/or vomiting subsides. Call your physician if vomiting continues.  Special Instructions/Symptoms: Your throat may feel dry or sore from the anesthesia or the breathing tube placed in your throat during surgery. If this causes discomfort, gargle with warm salt water. The discomfort should disappear within 24 hours.  If you had a scopolamine patch placed behind your ear for the management of post- operative nausea and/or vomiting:  1. The medication in the patch is effective for 72 hours, after which it should be removed.  Wrap patch in a tissue and discard in the trash. Wash hands thoroughly with soap and water. 2. You may remove the patch earlier than 72 hours if you experience unpleasant side effects which may include dry mouth, dizziness or visual disturbances. 3. Avoid touching the patch. Wash your hands with soap and water after contact with the patch.  No tylenol until after 1pm if needed.

## 2022-10-28 NOTE — Anesthesia Postprocedure Evaluation (Signed)
Anesthesia Post Note  Patient: Suzanne Lawrence  Procedure(s) Performed: RIGHT KNEE ARTHROSCOPY, DEBRIDEMENT (Right: Knee)     Patient location during evaluation: PACU Anesthesia Type: General Level of consciousness: awake and alert Pain management: pain level controlled Vital Signs Assessment: post-procedure vital signs reviewed and stable Respiratory status: spontaneous breathing, nonlabored ventilation, respiratory function stable and patient connected to nasal cannula oxygen Cardiovascular status: blood pressure returned to baseline and stable Postop Assessment: no apparent nausea or vomiting Anesthetic complications: no  No notable events documented.  Last Vitals:  Vitals:   10/28/22 0925 10/28/22 0955  BP: (!) 187/79 (!) 156/83  Pulse: 64   Resp: 20   Temp: (!) 36.2 C   SpO2: 94%     Last Pain:  Vitals:   10/28/22 0925  TempSrc:   PainSc: 4                  Kylan Liberati

## 2022-10-28 NOTE — Op Note (Unsigned)
[name], [name] MEDICAL RECORD NO: [id number] ACCOUNT NO: [id number] DATE OF BIRTH: [birth year] FACILITY: [facility] LOCATION: [facility] PHYSICIAN: Yetta Barre. Marlou Sa, MD  Operative Report   DATE OF PROCEDURE: 10/28/2022  PREOPERATIVE DIAGNOSES:  Right knee lateral meniscal tear.  POSTOPERATIVE DIAGNOSES:  Right knee lateral meniscal tear.  PROCEDURES:  Right knee arthroscopy with anterior horn lateral meniscal tear debridement.  SURGEON:  Yetta Barre. Marlou Sa, MD  ASSISTANT:  Annie Main, PA  INDICATIONS:  Suzanne Lawrence is a 52 year old patient with right knee pain and mechanical symptoms with anterior horn lateral meniscal tear who presents for operative management after explanation of risks and benefits.  DESCRIPTION OF PROCEDURE:  The patient was brought to the operating room where general anesthetic was induced.  Preoperative antibiotics administered.  Timeout was called.  Right leg examined under anesthesia and found to have full range of motion with  stable collateral cruciate ligaments.  Right knee was prescrubbed with alcohol and Betadine, allowed to air dry, prepped with DuraPrep solution and draped in sterile manner.  Charlie Pitter was not utilized for this case.  After calling timeout, an anterior  inferolateral and anterior inferomedial portals were established.  Diagnostic arthroscopy was performed.  Medial compartment intact with intact articular cartilage and meniscus.  ACL and PCL intact.  Patellofemoral compartment intact with intact  articular cartilage.  No loose bodies in medial or lateral gutter.  On the lateral side, there was some early grade I-II chondromalacia over about 20% of the weightbearing surface area of the lateral femoral condyle.  There was grade 2 chondromalacia  over 75% of the tibial plateau.  No loose chondral fragments were present.  Significant anterior horn lateral meniscal tear was present involving about 80-90% of the width of the meniscus.  Degenerative torn flaps  were present, which were unstable and  going into and out of the joint.  The meniscus was debrided, maintaining as much of the healthy rim as possible.  Following meniscal debridement, knee joint was thoroughly irrigated.  Instruments were removed, and portals were using 3-0 nylon.  Solution  of Marcaine, morphine, clonidine injected into the knee for postoperative pain relief.  Impervious dressings followed by Ace wrap applied.  The patient tolerated the procedure well without immediate complications.  Luke's assistance was required for  opening, closing, limb positioning during the case.  His assistance was a medical necessity.   MUK D: 10/28/2022 8:24:35 am T: 10/28/2022 8:32:00 am  JOB: [id number]

## 2022-10-28 NOTE — H&P (Signed)
Suzanne Lawrence is an 52 y.o. female.   Chief Complaint: right knee pain HPI: Suzanne Lawrence is a 52 y.o. female who presents with  right knee pain.  She states that she is doing reasonly well following her left shoulder arthroscopy and distal clavicle excision 03/21/2022.  Damp and cold weather does increase the pain to a slight degree.  She also returns for follow-up of right knee MRI scan.  That scan shows complex degenerative tearing of the anterior body and horn of the lateral meniscus.  Patellofemoral cartilage and medial sided cartilage intact.  Mild chondrosis on the lateral side.  Moderate joint effusion is present.  She did have an aspiration and injection in December.  Aleve is not helpful.  She does not want to repeat another injection.  She does describe mechanical symptoms in that knee.   Past Medical History:  Diagnosis Date   Abnormal Pap smear of cervix    Anemia    issue with red blood cells being small   Anxiety    Arthritis    Carpal tunnel syndrome    right   Depression    Diabetes mellitus without complication (HCC)    DVT (deep venous thrombosis) ([place]) 03/2021   left arm   GERD (gastroesophageal reflux disease)    High blood pressure    History of hiatal hernia    Left breast mass    Pre-diabetes     Past Surgical History:  Procedure Laterality Date   BIOPSY  02/05/2022   Procedure: BIOPSY;  Surgeon: Eloise Harman, DO;  [facility];  Service: Endoscopy;;   COLONOSCOPY  2022   Megan Straughan: 7 polyps removed, has to return in one year   COLONOSCOPY  09/2021   Megan Straughan: 12 polyps removed   ESOPHAGOGASTRODUODENOSCOPY (EGD) WITH PROPOFOL N/A 02/05/2022   Procedure: ESOPHAGOGASTRODUODENOSCOPY (EGD) WITH PROPOFOL;  Surgeon: Eloise Harman, DO;  [facility];  Service: Endoscopy;  Laterality: N/A;  2:00pm   left shoulder arthroscopy  12/2020   SHOULDER ARTHROSCOPY WITH OPEN ROTATOR CUFF REPAIR AND DISTAL CLAVICLE ACROMINECTOMY  Left 03/21/2022   Procedure: LEFT SHOULDER ARTHOSCOPY, DEBRIDEMENT,  MANIPULATION, ROTATOR CUFF REPAIR, ARTHROSCOPIC DISTAL CLAVICLE EXCISION;  Surgeon: Meredith Pel, MD;  Location: [place];  Service: Orthopedics;  Laterality: Left;   TUBAL LIGATION      Family History  Problem Relation Age of Onset   Lung cancer Mother    Other Father        multiple issues, doesn't know history for sure   Prostate cancer Father    Stroke Father    Aneurysm Paternal Grandmother    Breast cancer Maternal Aunt    Prostate cancer Maternal Uncle    Alcoholism Maternal Uncle    Aneurysm Cousin        paternal side   Aneurysm Cousin        paternal side    Breast cancer Cousin        maternal side    High blood pressure Other        father's side    Cerebral aneurysm Other        cerebral, on father's side    Alcoholism Other        maternal side    Colon cancer Neg Hx    Social History:  reports that she has been smoking cigarettes. She has been smoking an average of .5 packs per day. She has never used smokeless tobacco. She reports  that she does not currently use alcohol. She reports that she does not currently use drugs.  Allergies:  Allergies  Allergen Reactions   Ferumoxytol Other (See Comments)    Hot flash, nausea, chest pressure    Hydrochlorothiazide Other (See Comments)     "urinary frequency"; was going every 20 minutes and was causing problems at work    Medications Prior to Admission  Medication Sig Dispense Refill   ARIPiprazole (ABILIFY) 5 MG tablet Take 5 mg by mouth daily.     atenolol (TENORMIN) 50 MG tablet Take 50 mg by mouth daily.     baclofen (LIORESAL) 10 MG tablet Take 10 mg by mouth 3 (three) times daily as needed (Back pain).     chlorthalidone (HYGROTON) 25 MG tablet Take 25 mg by mouth every morning.     DULoxetine (CYMBALTA) 60 MG capsule Take 60 mg by mouth daily in the afternoon.     famotidine (PEPCID) 40 MG tablet Take 1 tablet (40 mg total) by mouth  daily. 90 tablet 1   gabapentin (NEURONTIN) 300 MG capsule Take 300 mg by mouth at bedtime.     insulin glargine (LANTUS) 100 UNIT/ML injection Inject 35 Units into the skin daily.     insulin lispro (HUMALOG) 100 UNIT/ML injection Inject 6-12 Units into the skin 3 (three) times daily with meals.     lisinopril (ZESTRIL) 40 MG tablet Take 40 mg by mouth daily.     meclizine (ANTIVERT) 25 MG tablet Take 25 mg by mouth 3 (three) times daily as needed for dizziness.     omeprazole (PRILOSEC) 40 MG capsule Take 1 capsule (40 mg total) by mouth daily. 30 capsule 11   sucralfate (CARAFATE) 1 g tablet Take 1 g by mouth daily as needed (Acid reflux).     ACCU-CHEK GUIDE test strip 3 (three) times daily.     Accu-Chek Softclix Lancets lancets 3 (three) times daily.     acetaminophen (TYLENOL) 325 MG tablet Take 650 mg by mouth every 6 (six) hours as needed for moderate pain.     Blood Glucose Monitoring Suppl (ACCU-CHEK GUIDE ME) w/Device KIT See admin instructions.     Continuous Blood Gluc Sensor (FREESTYLE LIBRE 3 SENSOR) MISC Place 1 sensor on the skin every 14 days. Use to check glucose continuously 6 each 1    No results found for this or any previous visit (from the past 48 hour(s)). No results found.  Review of Systems  Musculoskeletal:  Positive for arthralgias.  All other systems reviewed and are negative.   Height '5\' 1"'$  (1.549 m), weight 78.5 kg, last menstrual period 05/01/2021. Physical Exam Vitals reviewed.  HENT:     Head: Normocephalic.     Nose: Nose normal.     Mouth/Throat:     Mouth: Mucous membranes are moist.  Cardiovascular:     Rate and Rhythm: Normal rate.     Pulses: Normal pulses.  Pulmonary:     Effort: Pulmonary effort is normal.  Abdominal:     General: Abdomen is flat.  Musculoskeletal:     Cervical back: Normal range of motion.  Skin:    General: Skin is warm.     Capillary Refill: Capillary refill takes less than 2 seconds.  Neurological:      General: No focal deficit present.     Mental Status: She is alert.  Psychiatric:        Mood and Affect: Mood normal.    Ortho exam demonstrates mild  effusion in that right knee. Lateral greater than medial joint line tenderness. No groin pain with internal/external Tatian of the leg. No masses lymphadenopathy or skin changes noted in the right knee region. Range of motion is full.   Assessment/Plan Impression is lateral meniscal tear right knee. Failure of conservative management. Plan is right knee arthroscopy and debridement. The risk and benefits of the procedure discussed with the patient include not limited to infection nerve vessel damage knee stiffness as well as incomplete pain relief and possible acceleration of arthritis in that compartment. Patient understands the risk and benefits. Depending on how much of that meniscal rim remains we will really determine how quickly she develops arthritis in the knee. All questions answered.   Anderson Malta, MD 10/28/2022, 6:45 AM

## 2022-10-28 NOTE — Brief Op Note (Signed)
   10/28/2022  8:21 AM  PATIENT:  Suzanne Lawrence  52 y.o. female  PRE-OPERATIVE DIAGNOSIS:  right knee lateral meniscal tear  POST-OPERATIVE DIAGNOSIS:  right knee lateral meniscal tear  PROCEDURE:  Procedure(s): RIGHT KNEE ARTHROSCOPY, DEBRIDEMENT  SURGEON:  Surgeon(s): Meredith Pel, MD  ASSISTANT: magnant pa  ANESTHESIA:   general  EBL: 5 ml    Total I/O In: 600 [I.V.:500; IV Piggyback:100] Out: 10 [Blood:10]  BLOOD ADMINISTERED: none  DRAINS: none   LOCAL MEDICATIONS USED:  marcaine mso4 clonidine  SPECIMEN:  No Specimen  COUNTS:  YES  TOURNIQUET:  * No tourniquets in log *  DICTATION: .Other Dictation: Dictation Number [id number]  PLAN OF CARE: Discharge to home after PACU  PATIENT DISPOSITION:  PACU - hemodynamically stable

## 2022-10-28 NOTE — Transfer of Care (Signed)
Immediate Anesthesia Transfer of Care Note  Patient: Naava Strubhar  Procedure(s) Performed: RIGHT KNEE ARTHROSCOPY, DEBRIDEMENT (Right: Knee)  Patient Location: PACU  Anesthesia Type:General  Level of Consciousness: sedated  Airway & Oxygen Therapy: Patient Spontanous Breathing and Patient connected to face mask oxygen  Post-op Assessment: Report given to RN and Post -op Vital signs reviewed and stable  Post vital signs: Reviewed and stable  Last Vitals:  Vitals Value Taken Time  BP    Temp    Pulse 70 10/28/22 0820  Resp 15 10/28/22 0820  SpO2 99 % 10/28/22 0820  Vitals shown include unvalidated device data.  Last Pain:  Vitals:   10/28/22 0654  TempSrc: Oral  PainSc: 8       Patients Stated Pain Goal: 5 ([id number] [id number])  Complications: No notable events documented.

## 2022-10-28 NOTE — Anesthesia Procedure Notes (Signed)
Procedure Name: LMA Insertion Date/Time: 10/28/2022 7:53 AM  Performed by: Tawni Millers, CRNAPre-anesthesia Checklist: Patient identified, Emergency Drugs available, Suction available and Patient being monitored Patient Re-evaluated:Patient Re-evaluated prior to induction Oxygen Delivery Method: Circle system utilized Preoxygenation: Pre-oxygenation with 100% oxygen Induction Type: IV induction Ventilation: Mask ventilation without difficulty LMA: LMA inserted LMA Size: 4.0 Number of attempts: 1 Airway Equipment and Method: Bite block Placement Confirmation: positive ETCO2 Tube secured with: Tape Dental Injury: Teeth and Oropharynx as per pre-operative assessment

## 2022-10-29 ENCOUNTER — Telehealth: Payer: Self-pay | Admitting: Orthopedic Surgery

## 2022-10-29 ENCOUNTER — Encounter (HOSPITAL_BASED_OUTPATIENT_CLINIC_OR_DEPARTMENT_OTHER): Payer: Self-pay | Admitting: Orthopedic Surgery

## 2022-10-29 NOTE — Telephone Encounter (Signed)
Called and advised this was ok and to keep incision dry and clean. Pt stated understanding

## 2022-10-29 NOTE — Telephone Encounter (Signed)
Patient wants to know if she should change the galls on her knee because they are covered with blood

## 2022-11-06 ENCOUNTER — Ambulatory Visit (INDEPENDENT_AMBULATORY_CARE_PROVIDER_SITE_OTHER): Payer: BC Managed Care – PPO | Admitting: Orthopedic Surgery

## 2022-11-06 ENCOUNTER — Encounter: Payer: Self-pay | Admitting: Orthopedic Surgery

## 2022-11-06 DIAGNOSIS — M25461 Effusion, right knee: Secondary | ICD-10-CM

## 2022-11-06 NOTE — Progress Notes (Signed)
Post-Op Visit Note   Patient: Suzanne Lawrence           Date of Birth: 28-Dec-1970           MRN: [id number] Visit Date: 11/06/2022 PCP: Silver Firs Nation, MD   Assessment & Plan:  Chief Complaint:  Chief Complaint  Patient presents with   Right Knee - Routine Post Op    10/28/22 (9d)Right Knee Arthroscopy, Debridement      Visit Diagnoses:  1. Effusion, right knee     Plan: Suzanne Lawrence is a patient who is now about a week out right knee arthroscopy and significant anterolateral meniscal debridement.  She has been full weightbearing with a single crutch.  Pain still wakes her from sleep at night.  On examination she has no calf tenderness negative Homans.  She is able to flex to 90.  Mild effusion is present.  I want her to work on quad strengthening exercises and a nonload bearing weight.  Stationary bike would be ideal.  Follow-up in 4 weeks for clinical recheck.  Follow-Up Instructions: No follow-ups on file.   Orders:  No orders of the defined types were placed in this encounter.  No orders of the defined types were placed in this encounter.   Imaging: No results found.  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 01/25/2020   High blood pressure    Depression    Carpal tunnel syndrome    Anxiety    Past Medical History:  Diagnosis Date   Abnormal Pap smear of cervix    Anemia    issue with red blood cells being small   Anxiety    Arthritis    Carpal tunnel syndrome    right   Depression    Diabetes mellitus without complication (HCC)    DVT (deep venous thrombosis) ([place]) 03/2021   left arm   GERD (gastroesophageal  reflux disease)    High blood pressure    History of hiatal hernia    Left breast mass    Pre-diabetes     Family History  Problem Relation Age of Onset   Lung cancer Mother    Other Father        multiple issues, doesn't know history for sure   Prostate cancer Father    Stroke Father    Aneurysm Paternal Grandmother    Breast cancer Maternal Aunt    Prostate cancer Maternal Uncle    Alcoholism Maternal Uncle    Aneurysm Cousin        paternal side   Aneurysm Cousin        paternal side    Breast cancer Cousin        maternal side    High blood pressure Other        father's side    Cerebral aneurysm Other        cerebral, on father's side    Alcoholism Other        maternal side    Colon cancer Neg Hx     Past Surgical History:  Procedure Laterality Date   BIOPSY  02/05/2022   Procedure: BIOPSY;  Surgeon:  Eloise Harman, DO;  [facility];  Service: Endoscopy;;   COLONOSCOPY  2022   Suzanne Lawrence: 7 polyps removed, has to return in one year   COLONOSCOPY  09/2021   Suzanne Lawrence: 12 polyps removed   ESOPHAGOGASTRODUODENOSCOPY (EGD) WITH PROPOFOL N/A 02/05/2022   Procedure: ESOPHAGOGASTRODUODENOSCOPY (EGD) WITH PROPOFOL;  Surgeon: Eloise Harman, DO;  [facility];  Service: Endoscopy;  Laterality: N/A;  2:00pm   KNEE ARTHROSCOPY Right 10/28/2022   Procedure: RIGHT KNEE ARTHROSCOPY, DEBRIDEMENT;  Surgeon: Meredith Pel, MD;  Location: [place];  Service: Orthopedics;  Laterality: Right;   left shoulder arthroscopy  12/2020   SHOULDER ARTHROSCOPY WITH OPEN ROTATOR CUFF REPAIR AND DISTAL CLAVICLE ACROMINECTOMY Left 03/21/2022   Procedure: LEFT SHOULDER ARTHOSCOPY, DEBRIDEMENT,  MANIPULATION, ROTATOR CUFF REPAIR, ARTHROSCOPIC DISTAL CLAVICLE EXCISION;  Surgeon: Meredith Pel, MD;  Location: [place];  Service: Orthopedics;  Laterality: Left;   TUBAL LIGATION     Social History   Occupational History   Not on file   Tobacco Use   Smoking status: Every Day    Packs/day: .5    Types: Cigarettes   Smokeless tobacco: Never   Tobacco comments:    Working on cutting down on smoking. Down to 8 cigarrettes a day now.  Vaping Use   Vaping Use: Never used  Substance and Sexual Activity   Alcohol use: Not Currently   Drug use: Not Currently   Sexual activity: Yes    Birth control/protection: None

## 2022-11-24 DIAGNOSIS — S83281A Other tear of lateral meniscus, current injury, right knee, initial encounter: Secondary | ICD-10-CM

## 2022-11-27 ENCOUNTER — Encounter (HOSPITAL_COMMUNITY): Payer: Self-pay | Admitting: Hematology

## 2022-12-03 ENCOUNTER — Encounter (HOSPITAL_COMMUNITY): Payer: Self-pay | Admitting: Hematology

## 2022-12-04 ENCOUNTER — Other Ambulatory Visit (INDEPENDENT_AMBULATORY_CARE_PROVIDER_SITE_OTHER): Payer: BC Managed Care – PPO

## 2022-12-04 ENCOUNTER — Ambulatory Visit (INDEPENDENT_AMBULATORY_CARE_PROVIDER_SITE_OTHER): Payer: BC Managed Care – PPO | Admitting: Orthopedic Surgery

## 2022-12-04 ENCOUNTER — Other Ambulatory Visit: Payer: Self-pay

## 2022-12-04 DIAGNOSIS — Z9889 Other specified postprocedural states: Secondary | ICD-10-CM

## 2022-12-04 DIAGNOSIS — M25512 Pain in left shoulder: Secondary | ICD-10-CM

## 2022-12-08 ENCOUNTER — Encounter: Payer: Self-pay | Admitting: Orthopedic Surgery

## 2022-12-08 MED ORDER — LIDOCAINE HCL 1 % IJ SOLN
5.0000 mL | INTRAMUSCULAR | Status: AC | PRN
  Administered 2022-12-04: 5 mL

## 2022-12-08 MED ORDER — BUPIVACAINE HCL 0.5 % IJ SOLN
9.0000 mL | INTRAMUSCULAR | Status: AC | PRN
  Administered 2022-12-04: 9 mL via INTRA_ARTICULAR

## 2022-12-08 MED ORDER — METHYLPREDNISOLONE ACETATE 40 MG/ML IJ SUSP
40.0000 mg | INTRAMUSCULAR | Status: AC | PRN
  Administered 2022-12-04: 40 mg via INTRA_ARTICULAR

## 2022-12-08 NOTE — Progress Notes (Signed)
Office Visit Note   Patient: Suzanne Lawrence           Date of Birth: 04-07-1971           MRN: [id number] Visit Date: 12/04/2022 Requested by: Donetta Potts, MD [address] [place],  [place] [address] PCP: Donetta Potts, MD  Subjective: Chief Complaint  Patient presents with   Left Shoulder - Pain     left shoulder rotator cuff tear repair and distal clavicle excision on 03/21/2022   Right Knee - Routine Post Op    HPI: Suzanne Lawrence is a 52 y.o. female who presents to the office reporting right knee pain.  She underwent right knee arthroscopy and debridement 10/28/2022.  Reports some lateral catching.  That knee is aspirated today.  She also reports left shoulder pain.  Acute onset of pain and popping.  She wonders if that is from the crutch.  Has a history of surgery in 2023 which was manipulation under anesthesia with 1 and half by 1/2 cm rotator cuff tear repair..                ROS: All systems reviewed are negative as they relate to the chief complaint within the history of present illness.  Patient denies fevers or chills.  Assessment & Plan: Visit Diagnoses:  1. Status post rotator cuff repair     Plan: Impression is doing reasonably well from right knee arthroscopy 1 month out.  No calf tenderness negative Homans today.  Continue with nonweightbearing quad strengthening exercises and follow-up in 6 weeks for clinical recheck of the knee for final check.  Regarding the left shoulder we did inject it today in she will come back in 6 weeks for recheck on the shoulder and the knee.  Follow-Up Instructions: No follow-ups on file.   Orders:  Orders Placed This Encounter  Procedures   XR Shoulder Left   US Guided Needle Placement - No Linked Charges   No orders of the defined types were placed in this encounter.     Procedures: Large Joint Inj: L glenohumeral on 12/04/2022 10:29 AM Indications: diagnostic evaluation and pain Details: 18 G 1.5 in needle,  posterior approach  Arthrogram: No  Medications: 9 mL bupivacaine 0.5 %; 40 mg methylPREDNISolone acetate 40 MG/ML; 5 mL lidocaine 1 % Outcome: tolerated well, no immediate complications Procedure, treatment alternatives, risks and benefits explained, specific risks discussed. Consent was given by the patient. Immediately prior to procedure a time out was called to verify the correct patient, procedure, equipment, support staff and site/side marked as required. Patient was prepped and draped in the usual sterile fashion.       Clinical Data: No additional findings.  Objective: Vital Signs: LMP 05/01/2021 (Approximate)   Physical Exam:  Constitutional: Patient appears well-developed HEENT:  Head: Normocephalic Eyes:EOM are normal Neck: Normal range of motion Cardiovascular: Normal rate Pulmonary/chest: Effort normal Neurologic: Patient is alert Skin: Skin is warm Psychiatric: Patient has normal mood and affect  Ortho Exam: Ortho exam demonstrates moderate right knee effusion with good range of motion no calf tenderness negative Homans.  Left shoulder demonstrates tenderness in the deltoid region.  No AC joint tenderness.  Range of motion has forward flexion and abduction both above 90 degrees.  Rotator cuff strength is intact infraspinatus supraspinatus and subscap muscle testing.  Specialty Comments:  No specialty comments available.  Imaging: No results found.   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 01/25/2020   High blood pressure    Depression    Carpal tunnel syndrome     Anxiety    Past Medical History:  Diagnosis Date   Abnormal Pap smear of cervix    Anemia    issue with red blood cells being small   Anxiety    Arthritis    Carpal tunnel syndrome    right   Depression    Diabetes mellitus without complication (HCC)    DVT (deep venous thrombosis) (HCC) 03/2021   left arm   GERD (gastroesophageal reflux disease)    High blood pressure    History of hiatal hernia    Left breast mass    Pre-diabetes     Family History  Problem Relation Age of Onset   Lung cancer Mother    Other Father        multiple issues, doesn't know history for sure   Prostate cancer Father    Stroke Father    Aneurysm Paternal Grandmother    Breast cancer Maternal Aunt    Prostate cancer Maternal Uncle    Alcoholism Maternal Uncle    Aneurysm Cousin        paternal side   Aneurysm Cousin        paternal side    Breast cancer Cousin        maternal side    High blood pressure Other        father's side    Cerebral aneurysm Other        cerebral, on father's side    Alcoholism Other        maternal side    Colon cancer Neg Hx     Past Surgical History:  Procedure Laterality Date   BIOPSY  02/05/2022   Procedure: BIOPSY;  Surgeon: Lanelle Bal, DO;  [facility];  Service: Endoscopy;;   COLONOSCOPY  2022   Megan Straughan: 7 polyps removed, has to return in one year   COLONOSCOPY  09/2021   Megan Straughan: 12 polyps removed   ESOPHAGOGASTRODUODENOSCOPY (EGD) WITH PROPOFOL N/A 02/05/2022   Procedure: ESOPHAGOGASTRODUODENOSCOPY (EGD) WITH PROPOFOL;  Surgeon: Lanelle Bal, DO;  [facility];  Service: Endoscopy;  Laterality: N/A;  2:00pm   KNEE ARTHROSCOPY Right 10/28/2022   Procedure: RIGHT KNEE ARTHROSCOPY, DEBRIDEMENT;  Surgeon: Cammy Copa, MD;  Location: [facility];  Service: Orthopedics;  Laterality: Right;   left shoulder arthroscopy  12/2020   SHOULDER ARTHROSCOPY WITH OPEN ROTATOR CUFF REPAIR AND  DISTAL CLAVICLE ACROMINECTOMY Left 03/21/2022   Procedure: LEFT SHOULDER ARTHOSCOPY, DEBRIDEMENT,  MANIPULATION, ROTATOR CUFF REPAIR, ARTHROSCOPIC DISTAL CLAVICLE EXCISION;  Surgeon: Cammy Copa, MD;  Location: [facility];  Service: Orthopedics;  Laterality: Left;   TUBAL LIGATION     Social History   Occupational History   Not on file  Tobacco Use   Smoking status: Every Day    Packs/day: .5    Types: Cigarettes   Smokeless tobacco: Never   Tobacco comments:    Working on cutting down on smoking. Down to 8 cigarrettes a day now.  Vaping Use   Vaping Use: Never used  Substance and Sexual Activity   Alcohol use: Not Currently   Drug use: Not Currently   Sexual activity: Yes    Birth control/protection: None

## 2022-12-09 ENCOUNTER — Telehealth: Payer: Self-pay

## 2022-12-09 NOTE — Telephone Encounter (Signed)
-----   Message from Cammy Copa, MD sent at 12/08/2022 10:49 AM EDT ----- Cresenciano Lick can you have Victorino Dike follow-up with Franky Macho in 6 weeks.  Thanks

## 2022-12-09 NOTE — Telephone Encounter (Signed)
Please make sure appt is scheduled per Dr Dean. Thanks.  

## 2022-12-23 ENCOUNTER — Encounter (HOSPITAL_COMMUNITY): Payer: Self-pay | Admitting: Hematology

## 2022-12-25 ENCOUNTER — Ambulatory Visit (INDEPENDENT_AMBULATORY_CARE_PROVIDER_SITE_OTHER): Payer: BC Managed Care – PPO | Admitting: Nurse Practitioner

## 2022-12-25 ENCOUNTER — Encounter: Payer: Self-pay | Admitting: Nurse Practitioner

## 2022-12-25 VITALS — BP 130/80 | HR 62 | Ht 61.0 in | Wt 182.0 lb

## 2022-12-25 DIAGNOSIS — Z794 Long term (current) use of insulin: Secondary | ICD-10-CM | POA: Diagnosis not present

## 2022-12-25 DIAGNOSIS — E1165 Type 2 diabetes mellitus with hyperglycemia: Secondary | ICD-10-CM

## 2022-12-25 LAB — POCT GLYCOSYLATED HEMOGLOBIN (HGB A1C): Hemoglobin A1C: 7.4 % — AB (ref 4.0–5.6)

## 2022-12-25 MED ORDER — FREESTYLE LIBRE 3 SENSOR MISC: 1 refills | Status: DC

## 2022-12-25 NOTE — Progress Notes (Signed)
Endocrinology Follow Up Note       12/25/2022, 12:03 PM   Subjective:    Patient ID: Suzanne Lawrence, female    DOB: 1970-12-06.  Suzanne Lawrence is being seen in follow up after being seen in consultation for management of currently uncontrolled symptomatic diabetes requested by  Donetta Potts, MD.   Past Medical History:  Diagnosis Date   Abnormal Pap smear of cervix    Anemia    issue with red blood cells being small   Anxiety    Arthritis    Carpal tunnel syndrome    right   Depression    Diabetes mellitus without complication (HCC)    DVT (deep venous thrombosis) (HCC) 03/2021   left arm   GERD (gastroesophageal reflux disease)    High blood pressure    History of hiatal hernia    Left breast mass    Pre-diabetes     Past Surgical History:  Procedure Laterality Date   BIOPSY  02/05/2022   Procedure: BIOPSY;  Surgeon: Lanelle Bal, DO;  [facility];  Service: Endoscopy;;   COLONOSCOPY  2022   Megan Straughan: 7 polyps removed, has to return in one year   COLONOSCOPY  09/2021   Megan Straughan: 12 polyps removed   ESOPHAGOGASTRODUODENOSCOPY (EGD) WITH PROPOFOL N/A 02/05/2022   Procedure: ESOPHAGOGASTRODUODENOSCOPY (EGD) WITH PROPOFOL;  Surgeon: Lanelle Bal, DO;  [facility];  Service: Endoscopy;  Laterality: N/A;  2:00pm   KNEE ARTHROSCOPY Right 10/28/2022   Procedure: RIGHT KNEE ARTHROSCOPY, DEBRIDEMENT;  Surgeon: Cammy Copa, MD;  Location: [facility];  Service: Orthopedics;  Laterality: Right;   left shoulder arthroscopy  12/2020   SHOULDER ARTHROSCOPY WITH OPEN ROTATOR CUFF REPAIR AND DISTAL CLAVICLE ACROMINECTOMY Left 03/21/2022   Procedure: LEFT SHOULDER ARTHOSCOPY, DEBRIDEMENT,  MANIPULATION, ROTATOR CUFF REPAIR, ARTHROSCOPIC DISTAL CLAVICLE EXCISION;  Surgeon: Cammy Copa, MD;  Location: [facility];  Service: Orthopedics;   Laterality: Left;   TUBAL LIGATION      Social History   Socioeconomic History   Marital status: Single    Spouse name: Not on file   Number of children: 3   Years of education: Not on file   Highest education level: Some college, no degree  Occupational History   Not on file  Tobacco Use   Smoking status: Every Day    Packs/day: .5    Types: Cigarettes   Smokeless tobacco: Never   Tobacco comments:    Working on cutting down on smoking. Down to 8 cigarrettes a day now.  Vaping Use   Vaping Use: Never used  Substance and Sexual Activity   Alcohol use: Not Currently   Drug use: Not Currently   Sexual activity: Yes    Birth control/protection: None  Other Topics Concern   Not on file  Social History Narrative   Lives at home with 2 daughters   Right handed   Caffeine: about 2-3 per day    Social Determinants of Health   Financial Resource Strain: Not on file  Food Insecurity: Not on file  Transportation Needs: Not on file  Physical Activity: Not on file  Stress:  Stress Concern Present (08/22/2022)   [employer] of Occupational Health - Occupational Stress Questionnaire    Feeling of Stress : Very much  Social Connections: Not on file    Family History  Problem Relation Age of Onset   Lung cancer Mother    Other Father        multiple issues, doesn't know history for sure   Prostate cancer Father    Stroke Father    Aneurysm Paternal Grandmother    Breast cancer Maternal Aunt    Prostate cancer Maternal Uncle    Alcoholism Maternal Uncle    Aneurysm Cousin        paternal side   Aneurysm Cousin        paternal side    Breast cancer Cousin        maternal side    High blood pressure Other        father's side    Cerebral aneurysm Other        cerebral, on father's side    Alcoholism Other        maternal side    Colon cancer Neg Hx     Outpatient Encounter Medications as of 12/25/2022  Medication Sig   ACCU-CHEK GUIDE test strip 3 (three)  times daily.   Accu-Chek Softclix Lancets lancets 3 (three) times daily.   acetaminophen (TYLENOL) 325 MG tablet Take 650 mg by mouth every 6 (six) hours as needed for moderate pain.   ARIPiprazole (ABILIFY) 5 MG tablet Take 5 mg by mouth daily.   atenolol (TENORMIN) 50 MG tablet Take 50 mg by mouth daily.   baclofen (LIORESAL) 10 MG tablet Take 10 mg by mouth 3 (three) times daily as needed (Back pain).   Blood Glucose Monitoring Suppl (ACCU-CHEK GUIDE ME) w/Device KIT See admin instructions.   chlorthalidone (HYGROTON) 25 MG tablet Take 25 mg by mouth every morning.   DULoxetine (CYMBALTA) 60 MG capsule Take 60 mg by mouth daily in the afternoon.   famotidine (PEPCID) 40 MG tablet Take 1 tablet (40 mg total) by mouth daily.   gabapentin (NEURONTIN) 300 MG capsule Take 300 mg by mouth at bedtime.   insulin glargine (LANTUS) 100 UNIT/ML injection Inject 35 Units into the skin daily.   insulin lispro (HUMALOG) 100 UNIT/ML injection Inject 6-12 Units into the skin 3 (three) times daily with meals.   lisinopril (ZESTRIL) 40 MG tablet Take 40 mg by mouth daily.   meclizine (ANTIVERT) 25 MG tablet Take 25 mg by mouth 3 (three) times daily as needed for dizziness.   omeprazole (PRILOSEC) 40 MG capsule Take 1 capsule (40 mg total) by mouth daily.   oxyCODONE (ROXICODONE) 5 MG immediate release tablet Take 1 tablet (5 mg total) by mouth every 4 (four) hours as needed for severe pain.   sucralfate (CARAFATE) 1 g tablet Take 1 g by mouth daily as needed (Acid reflux).   Continuous Glucose Sensor (FREESTYLE LIBRE 3 SENSOR) MISC Place 1 sensor on the skin every 14 days. Use to check glucose continuously   [DISCONTINUED] Continuous Blood Gluc Sensor (FREESTYLE LIBRE 3 SENSOR) MISC Place 1 sensor on the skin every 14 days. Use to check glucose continuously (Patient not taking: Reported on 12/25/2022)   No facility-administered encounter medications on file as of 12/25/2022.    ALLERGIES: Allergies  Allergen  Reactions   Ferumoxytol Other (See Comments)    Hot flash, nausea, chest pressure    Hydrochlorothiazide Other (See Comments)     "  urinary frequency"; was going every 20 minutes and was causing problems at work    VACCINATION STATUS: Immunization History  Administered Date(s) Administered   [employer] Vaccination 07/06/2020    Diabetes She presents for her follow-up diabetic visit. She has type 2 diabetes mellitus. Onset time: diagnosed at approx age of 89 (in June) Her disease course has been improving. There are no hypoglycemic associated symptoms. Associated symptoms include fatigue. Pertinent negatives for diabetes include no blurred vision, no polydipsia, no polyuria, no visual change and no weight loss. There are no hypoglycemic complications. Symptoms are stable. There are no diabetic complications. Risk factors for coronary artery disease include diabetes mellitus, dyslipidemia, family history, obesity, hypertension, sedentary lifestyle and tobacco exposure. Current diabetic treatment includes intensive insulin program. She is compliant with treatment most of the time. Her weight is fluctuating minimally. She is following a generally healthy diet. When asked about meal planning, she reported none. She has had a previous visit with a dietitian. She rarely participates in exercise. Her home blood glucose trend is decreasing steadily. Her breakfast blood glucose range is generally 140-180 mg/dl. Her lunch blood glucose range is generally 140-180 mg/dl. Her dinner blood glucose range is generally 140-180 mg/dl. Her bedtime blood glucose range is generally 140-180 mg/dl. Her overall blood glucose range is 140-180 mg/dl. (She presents today with her logs, no meter, showing improved glycemic profile overall.  Her POCT A1c today is 7.4%, improving drastically from last visit of 12.6%.  She could not afford the copay for CGM.  She brought in patient assistance paperwork to help cover cost of  insulins.  She denies any hypoglycemia.) An ACE inhibitor/angiotensin II receptor blocker is being taken. She sees a podiatrist.Eye exam is not current.     Review of systems  Constitutional: + steadily increasing body weight, current Body mass index is 34.39 kg/m., no fatigue, no subjective hyperthermia, no subjective hypothermia Eyes: + blurry vision-improving, no xerophthalmia ENT: no sore throat, no nodules palpated in throat, no dysphagia/odynophagia, no hoarseness Cardiovascular: no chest pain, no shortness of breath, no palpitations, no leg swelling Respiratory: no cough, no shortness of breath Gastrointestinal: no nausea/vomiting/diarrhea Musculoskeletal: no muscle/joint aches Skin: no rashes, no hyperemia Neurological: no tremors, no numbness, no tingling, no dizziness Psychiatric: no depression, no anxiety  Objective:     BP 130/80 (BP Location: Left Arm, Patient Position: Sitting, Cuff Size: Large)   Pulse 62   Ht 5\' 1"  (1.549 m)   Wt 182 lb (82.6 kg)   LMP 05/01/2021 (Approximate)   BMI 34.39 kg/m   Wt Readings from Last 3 Encounters:  12/25/22 182 lb (82.6 kg)  10/28/22 175 lb 14.8 oz (79.8 kg)  09/30/22 172 lb 6.4 oz (78.2 kg)     BP Readings from Last 3 Encounters:  12/25/22 130/80  10/28/22 (!) 156/83  09/30/22 134/77     Physical Exam- Limited  Constitutional:  Body mass index is 34.39 kg/m. , not in acute distress, normal state of mind Eyes:  EOMI, no exophthalmos Musculoskeletal: no gross deformities, strength intact in all four extremities, no gross restriction of joint movements Skin:  no rashes, no hyperemia Neurological: no tremor with outstretched hands    CMP ( most recent) CMP     Component Value Date/Time   NA 136 10/22/2022 1233   K 3.8 10/22/2022 1233   CL 97 (L) 10/22/2022 1233   CO2 29 10/22/2022 1233   GLUCOSE 99 10/22/2022 1233   BUN 9 10/22/2022 1233  BUN 6 08/06/2022 0000   CREATININE 0.79 10/22/2022 1233   CALCIUM 9.2  10/22/2022 1233   ALBUMIN 5.3 (A) 08/02/2022 0000   AST 67 (A) 08/06/2022 0000   ALT 63 (A) 08/06/2022 0000   ALKPHOS 125 08/02/2022 0000   GFRNONAA >60 10/22/2022 1233     Diabetic Labs (most recent): Lab Results  Component Value Date   HGBA1C 7.4 (A) 12/25/2022   HGBA1C 12.6 (A) 09/02/2022   HGBA1C 7.6 06/03/2022   MICROALBUR 10 mg/L 09/30/2022     Lipid Panel ( most recent) Lipid Panel  No results found for: "CHOL", "TRIG", "HDL", "CHOLHDL", "VLDL", "LDLCALC", "LDLDIRECT", "LABVLDL"    No results found for: "TSH", "FREET4"       Assessment & Plan:   1) Type 2 diabetes mellitus with hyperglycemia, with long-term current use of insulin (HCC)  She presents today with her logs, no meter, showing improved glycemic profile overall.  Her POCT A1c today is 7.4%, improving drastically from last visit of 12.6%.  She could not afford the copay for CGM.  She brought in patient assistance paperwork to help cover cost of insulins.  She denies any hypoglycemia.  - Suzanne Lawrence has currently uncontrolled symptomatic type 2 DM since 52 years of age.   -Recent labs reviewed.  - I had a long discussion with her about the progressive nature of diabetes and the pathology behind its complications. -her diabetes is not currently complicated but she remains at a high risk for more acute and chronic complications which include CAD, CVA, CKD, retinopathy, and neuropathy. These are all discussed in detail with her.  The following Lifestyle Medicine recommendations according to American College of Lifestyle Medicine [facility]) were discussed and offered to patient and she agrees to start the journey:  A. Whole Foods, Plant-based plate comprising of fruits and vegetables, plant-based proteins, whole-grain carbohydrates was discussed in detail with the patient.   A list for source of those nutrients were also provided to the patient.  Patient will use only water or unsweetened tea for hydration. B.   The need to stay away from risky substances including alcohol, smoking; obtaining 7 to 9 hours of restorative sleep, at least 150 minutes of moderate intensity exercise weekly, the importance of healthy social connections,  and stress reduction techniques were discussed. C.  A full color page of  Calorie density of various food groups per pound showing examples of each food groups was provided to the patient.  - Nutritional counseling repeated at each appointment due to patients tendency to fall back in to old habits.  - The patient admits there is a room for improvement in their diet and drink choices. -  Suggestion is made for the patient to avoid simple carbohydrates from their diet including Cakes, Sweet Desserts / Pastries, Ice Cream, Soda (diet and regular), Sweet Tea, Candies, Chips, Cookies, Sweet Pastries, Store Bought Juices, Alcohol in Excess of 1-2 drinks a day, Artificial Sweeteners, Coffee Creamer, and "Sugar-free" Products. This will help patient to have stable blood glucose profile and potentially avoid unintended weight gain.   - I encouraged the patient to switch to unprocessed or minimally processed complex starch and increased protein intake (animal or plant source), fruits, and vegetables.   - Patient is advised to stick to a routine mealtimes to eat 3 meals a day and avoid unnecessary snacks (to snack only to correct hypoglycemia).  - she has been seeing Norm Salt, RDN, CDE for diabetes education.  - I have approached  her with the following individualized plan to manage her diabetes and patient agrees:   She is advised to increase her Lantus to 40 units SQ nightly and continue Humalog 6-12 units TID with meals if glucose is above 90 and she is eating (Specific instructions on how to titrate insulin dosage based on glucose readings given to patient in writing).    -she is encouraged to continue using CGM to monitor glucose 4 times daily, before meals and before bed, and to  call the clinic if she has readings less than 70 or above 300 for 3 tests in a row.  I did give her sample Libre 3 and will reach out to rep regarding cost concern.  - she is warned not to take insulin without proper monitoring per orders. - Adjustment parameters are given to her for hypo and hyperglycemia in writing.  - she will be considered for incretin therapy as appropriate next visit.  - Specific targets for  A1c; LDL, HDL, and Triglycerides were discussed with the patient.  2) Blood Pressure /Hypertension:  her blood pressure is controlled to target.   she is advised to continue her current medications including Lisinopril 40 mg p.o. daily with breakfast, and Atenolol 50 mg po daily.  3) Lipids/Hyperlipidemia:    There is no recent lipid panel available to review, nor is she on any lipid lowering medications.  Will check lipid panel on subsequent visits.  4)  Weight/Diet:  her Body mass index is 34.39 kg/m.  -  clearly complicating her diabetes care.   she is a candidate for weight loss. I discussed with her the fact that loss of 5 - 10% of her  current body weight will have the most impact on her diabetes management.  Exercise, and detailed carbohydrates information provided  -  detailed on discharge instructions.  5) Chronic Care/Health Maintenance: -she is on ACEI/ARB and not on Statin medications and is encouraged to initiate and continue to follow up with Ophthalmology, Dentist, Podiatrist at least yearly or according to recommendations, and advised to QUIT SMOKING. I have recommended yearly flu vaccine and pneumonia vaccine at least every 5 years; moderate intensity exercise for up to 150 minutes weekly; and sleep for at least 7 hours a day.  - she is advised to maintain close follow up with Donetta Potts, MD for primary care needs, as well as her other providers for optimal and coordinated care.      I spent  40  minutes in the care of the patient today including review  of labs from CMP, Lipids, Thyroid Function, Hematology (current and previous including abstractions from other facilities); face-to-face time discussing  her blood glucose readings/logs, discussing hypoglycemia and hyperglycemia episodes and symptoms, medications doses, her options of short and long term treatment based on the latest standards of care / guidelines;  discussion about incorporating lifestyle medicine;  and documenting the encounter. Risk reduction counseling performed per USPSTF guidelines to reduce obesity and cardiovascular risk factors.     Please refer to Patient Instructions for Blood Glucose Monitoring and Insulin/Medications Dosing Guide"  in media tab for additional information. Please  also refer to " Patient Self Inventory" in the Media  tab for reviewed elements of pertinent patient history.  [name] participated in the discussions, expressed understanding, and voiced agreement with the above plans.  All questions were answered to her satisfaction. she is encouraged to contact clinic should she have any questions or concerns prior to her return visit.  Follow up plan: - Return in about 3 months (around 03/27/2023) for Diabetes F/U with A1c in office, No previsit labs, Bring meter and logs.   Ronny Bacon, [facility] [address] [place], [place] [address] Phone: [phone] Fax: [phone]  12/25/2022, 12:03 PM

## 2022-12-30 ENCOUNTER — Ambulatory Visit: Payer: BC Managed Care – PPO | Admitting: Nurse Practitioner

## 2023-01-04 ENCOUNTER — Other Ambulatory Visit: Payer: Self-pay | Admitting: Nurse Practitioner

## 2023-01-04 DIAGNOSIS — E1165 Type 2 diabetes mellitus with hyperglycemia: Secondary | ICD-10-CM

## 2023-01-07 MED ORDER — DEXCOM G7 SENSOR MISC: 2 refills | Status: DC

## 2023-01-07 NOTE — Telephone Encounter (Signed)
Spoke with pt, she agreed to switch from [facility] to [facility]. Rx for Dexcom G7 sensors sent to pharmacy.

## 2023-01-07 NOTE — Telephone Encounter (Signed)
Can you check with patient and see if she minds changing to the Dexcom?

## 2023-01-10 ENCOUNTER — Other Ambulatory Visit: Payer: Self-pay | Admitting: *Deleted

## 2023-01-10 ENCOUNTER — Telehealth: Payer: Self-pay | Admitting: Nurse Practitioner

## 2023-01-10 DIAGNOSIS — E1165 Type 2 diabetes mellitus with hyperglycemia: Secondary | ICD-10-CM

## 2023-01-10 MED ORDER — FREESTYLE LIBRE 3 READER DEVI: 1.0000 | Status: DC

## 2023-01-10 NOTE — Telephone Encounter (Signed)
Patient said that she needs the [place] Reader sent to [facility].

## 2023-01-10 NOTE — Telephone Encounter (Signed)
A prescription was sent.

## 2023-01-15 ENCOUNTER — Ambulatory Visit (INDEPENDENT_AMBULATORY_CARE_PROVIDER_SITE_OTHER): Payer: BC Managed Care – PPO | Admitting: Orthopedic Surgery

## 2023-01-15 ENCOUNTER — Encounter: Payer: Self-pay | Admitting: Orthopedic Surgery

## 2023-01-15 DIAGNOSIS — Z9889 Other specified postprocedural states: Secondary | ICD-10-CM

## 2023-01-15 NOTE — Progress Notes (Unsigned)
Post-Op Visit Note   Patient: Suzanne Lawrence           Date of Birth: May 25, 1971           MRN: [id number] Visit Date: 01/15/2023 PCP: Donetta Potts, MD   Assessment & Plan:  Chief Complaint:  Chief Complaint  Patient presents with   Right Knee - Routine Post Op   Visit Diagnoses: No diagnosis found.  Plan: Suzanne Lawrence is a patient underwent right knee arthroscopy and debridement 10/28/2022.  She also had left glenohumeral injection for 1024 which helped a lot on her left shoulder as well as right knee aspiration at the same time.  Right knee still has some popping.  On examination she has trace effusion in the right knee but good range of motion.  Negative Homans no calf tenderness.  Shoulder range of motion has improved.  Plan at this time is to continue nonweightbearing quad strengthening exercises for the right knee.  She may want to come back in the fall for another left glenohumeral shoulder joint injection.  Follow-up with [place] as needed.  Overall she states she has pain at times in that right knee but nothing like it was.  Follow-Up Instructions: No follow-ups on file.   Orders:  No orders of the defined types were placed in this encounter.  No orders of the defined types were placed in this encounter.   Imaging: No results found.  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 01/25/2020   High blood pressure    Depression    Carpal tunnel syndrome    Anxiety    Past Medical History:  Diagnosis Date   Abnormal Pap smear of cervix    Anemia    issue with red blood cells being  small   Anxiety    Arthritis    Carpal tunnel syndrome    right   Depression    Diabetes mellitus without complication (HCC)    DVT (deep venous thrombosis) (HCC) 03/2021   left arm   GERD (gastroesophageal reflux disease)    High blood pressure    History of hiatal hernia    Left breast mass    Pre-diabetes     Family History  Problem Relation Age of Onset   Lung cancer Mother    Other Father        multiple issues, doesn't know history for sure   Prostate cancer Father    Stroke Father    Aneurysm Paternal Grandmother    Breast cancer Maternal Aunt    Prostate cancer Maternal Uncle    Alcoholism Maternal Uncle    Aneurysm Cousin        paternal side   Aneurysm Cousin        paternal side    Breast cancer Cousin        maternal side    High blood pressure Other        father's side    Cerebral aneurysm Other        cerebral, on father's side    Alcoholism Other  maternal side    Colon cancer Neg Hx     Past Surgical History:  Procedure Laterality Date   BIOPSY  02/05/2022   Procedure: BIOPSY;  Surgeon: Lanelle Bal, DO;  [facility];  Service: Endoscopy;;   COLONOSCOPY  2022   Suzanne Lawrence: 7 polyps removed, has to return in one year   COLONOSCOPY  09/2021   Suzanne Lawrence: 12 polyps removed   ESOPHAGOGASTRODUODENOSCOPY (EGD) WITH PROPOFOL N/A 02/05/2022   Procedure: ESOPHAGOGASTRODUODENOSCOPY (EGD) WITH PROPOFOL;  Surgeon: Lanelle Bal, DO;  [facility]: [facility];  Service: Endoscopy;  Laterality: N/A;  2:00pm   KNEE ARTHROSCOPY Right 10/28/2022   Procedure: RIGHT KNEE ARTHROSCOPY, DEBRIDEMENT;  Surgeon: Cammy Copa, MD;  Location:  [facility]UR[facility]ERY [facility];  Service: Orthopedics;  Laterality: Right;   left shoulder arthroscopy  12/2020   SHOULDER ARTHROSCOPY WITH OPEN ROTATOR CUFF REPAIR AND DISTAL CLAVICLE ACROMINECTOMY Left 03/21/2022   Procedure: LEFT SHOULDER ARTHOSCOPY, DEBRIDEMENT,  MANIPULATION, ROTATOR CUFF REPAIR,  ARTHROSCOPIC DISTAL CLAVICLE EXCISION;  Surgeon: Cammy Copa, MD;  Location: [facility];  Service: Orthopedics;  Laterality: Left;   TUBAL LIGATION     Social History   Occupational History   Not on file  Tobacco Use   Smoking status: Every Day    Packs/day: .5    Types: Cigarettes   Smokeless tobacco: Never   Tobacco comments:    Working on cutting down on smoking. Down to 8 cigarrettes a day now.  Vaping Use   Vaping Use: Never used  Substance and Sexual Activity   Alcohol use: Not Currently   Drug use: Not Currently   Sexual activity: Yes    Birth control/protection: None

## 2023-01-18 ENCOUNTER — Encounter (HOSPITAL_COMMUNITY): Payer: Self-pay | Admitting: Hematology

## 2023-01-18 ENCOUNTER — Other Ambulatory Visit (HOSPITAL_COMMUNITY): Payer: Self-pay

## 2023-02-04 ENCOUNTER — Encounter: Payer: Self-pay | Admitting: Gastroenterology

## 2023-03-01 ENCOUNTER — Other Ambulatory Visit: Payer: Self-pay | Admitting: Gastroenterology

## 2023-03-01 DIAGNOSIS — K219 Gastro-esophageal reflux disease without esophagitis: Secondary | ICD-10-CM

## 2023-03-06 ENCOUNTER — Encounter: Payer: Self-pay | Admitting: Orthopedic Surgery

## 2023-03-06 NOTE — Telephone Encounter (Signed)
Too early

## 2023-03-07 NOTE — Telephone Encounter (Signed)
She can come in for Toradol injection if she wants to try that.  Please call thanks

## 2023-03-10 ENCOUNTER — Ambulatory Visit (INDEPENDENT_AMBULATORY_CARE_PROVIDER_SITE_OTHER): Payer: BC Managed Care – PPO | Admitting: Surgical

## 2023-03-10 ENCOUNTER — Inpatient Hospital Stay: Payer: BC Managed Care – PPO | Attending: Hematology

## 2023-03-10 ENCOUNTER — Other Ambulatory Visit: Payer: Self-pay

## 2023-03-10 DIAGNOSIS — F17210 Nicotine dependence, cigarettes, uncomplicated: Secondary | ICD-10-CM | POA: Insufficient documentation

## 2023-03-10 DIAGNOSIS — Z800 Family history of malignant neoplasm of digestive organs: Secondary | ICD-10-CM | POA: Insufficient documentation

## 2023-03-10 DIAGNOSIS — D563 Thalassemia minor: Secondary | ICD-10-CM | POA: Insufficient documentation

## 2023-03-10 DIAGNOSIS — Z801 Family history of malignant neoplasm of trachea, bronchus and lung: Secondary | ICD-10-CM | POA: Insufficient documentation

## 2023-03-10 DIAGNOSIS — Z9889 Other specified postprocedural states: Secondary | ICD-10-CM

## 2023-03-10 DIAGNOSIS — D751 Secondary polycythemia: Secondary | ICD-10-CM | POA: Insufficient documentation

## 2023-03-10 DIAGNOSIS — Z803 Family history of malignant neoplasm of breast: Secondary | ICD-10-CM | POA: Insufficient documentation

## 2023-03-10 DIAGNOSIS — Z8042 Family history of malignant neoplasm of prostate: Secondary | ICD-10-CM | POA: Insufficient documentation

## 2023-03-10 DIAGNOSIS — D509 Iron deficiency anemia, unspecified: Secondary | ICD-10-CM | POA: Insufficient documentation

## 2023-03-11 ENCOUNTER — Inpatient Hospital Stay: Payer: BC Managed Care – PPO

## 2023-03-11 DIAGNOSIS — D509 Iron deficiency anemia, unspecified: Secondary | ICD-10-CM | POA: Diagnosis present

## 2023-03-11 DIAGNOSIS — D500 Iron deficiency anemia secondary to blood loss (chronic): Secondary | ICD-10-CM

## 2023-03-11 DIAGNOSIS — D751 Secondary polycythemia: Secondary | ICD-10-CM

## 2023-03-11 DIAGNOSIS — D563 Thalassemia minor: Secondary | ICD-10-CM | POA: Diagnosis not present

## 2023-03-11 DIAGNOSIS — F17210 Nicotine dependence, cigarettes, uncomplicated: Secondary | ICD-10-CM | POA: Diagnosis not present

## 2023-03-11 DIAGNOSIS — Z800 Family history of malignant neoplasm of digestive organs: Secondary | ICD-10-CM | POA: Diagnosis not present

## 2023-03-11 DIAGNOSIS — R718 Other abnormality of red blood cells: Secondary | ICD-10-CM

## 2023-03-11 DIAGNOSIS — Z801 Family history of malignant neoplasm of trachea, bronchus and lung: Secondary | ICD-10-CM | POA: Diagnosis not present

## 2023-03-11 DIAGNOSIS — Z8042 Family history of malignant neoplasm of prostate: Secondary | ICD-10-CM | POA: Diagnosis not present

## 2023-03-11 DIAGNOSIS — Z803 Family history of malignant neoplasm of breast: Secondary | ICD-10-CM | POA: Diagnosis not present

## 2023-03-11 LAB — CBC WITH DIFFERENTIAL/PLATELET
Abs Immature Granulocytes: 0.04 10*3/uL (ref 0.00–0.07)
Basophils Absolute: 0.1 10*3/uL (ref 0.0–0.1)
Basophils Relative: 1 %
Eosinophils Absolute: 0.2 10*3/uL (ref 0.0–0.5)
Eosinophils Relative: 2 %
HCT: 46 % (ref 36.0–46.0)
Hemoglobin: 15.2 g/dL — ABNORMAL HIGH (ref 12.0–15.0)
Immature Granulocytes: 0 %
Lymphocytes Relative: 36 %
Lymphs Abs: 3.3 10*3/uL (ref 0.7–4.0)
MCH: 27.2 pg (ref 26.0–34.0)
MCHC: 33 g/dL (ref 30.0–36.0)
MCV: 82.3 fL (ref 80.0–100.0)
Monocytes Absolute: 0.7 10*3/uL (ref 0.1–1.0)
Monocytes Relative: 7 %
Neutro Abs: 4.8 10*3/uL (ref 1.7–7.7)
Neutrophils Relative %: 54 %
Platelets: 307 10*3/uL (ref 150–400)
RBC: 5.59 MIL/uL — ABNORMAL HIGH (ref 3.87–5.11)
RDW: 13.5 % (ref 11.5–15.5)
WBC: 9.2 10*3/uL (ref 4.0–10.5)
nRBC: 0 % (ref 0.0–0.2)

## 2023-03-11 LAB — IRON AND TIBC
Iron: 147 ug/dL (ref 28–170)
Saturation Ratios: 36 % — ABNORMAL HIGH (ref 10.4–31.8)
TIBC: 412 ug/dL (ref 250–450)
UIBC: 265 ug/dL

## 2023-03-11 LAB — FERRITIN: Ferritin: 64 ng/mL (ref 11–307)

## 2023-03-12 ENCOUNTER — Other Ambulatory Visit: Payer: BC Managed Care – PPO

## 2023-03-14 NOTE — Progress Notes (Unsigned)
GI Office Note    Referring Provider: Donetta Potts, MD Primary Care Physician:  Donetta Potts, MD Primary Gastroenterologist: Hennie Duos. Marletta Lor, DO   Date:  03/17/2023  ID:  Suzanne Lawrence, DOB 05-15-1971, MRN [id number]   Chief Complaint   Chief Complaint  Patient presents with   Follow-up    Having some diarrhea   History of Present Illness  Suzanne Lawrence is a 53 y.o. female with a history of anxiety, diabetes, GERD, DVT, HTN, arthritis, depression, GERD presenting today for follow-up of diarrhea.  Colonoscopy January 2022 (Dr. Arlyn Dunning) - One 13 mm polyp in the distal transverse colon - One 4 mm polyp in the descending colon - One 6 mm polyp at 50 cm proximal to the anus - One 9 mm polyp at 40 cm proximal to the anus - One 7 mm polyp at 25 cm proximal to the anus - Two 5 mm polyps at 24 cm proximal to the anus - Repeat colonoscopy in 1 year for surveillance based on pathology results.                                                                   Colonoscopy February 2023 (Dr. Rozell Searing): - Two 3 to 4 mm polyps in the sigmoid colon and at 20  cm proximal to the anus - Two 3 to 4 mm polyps in the descending colon,  - One 4 mm polyp in the descending colon - Four 3 to 4 mm polyps in the sigmoid colon, - Three 2 to 4 mm polyps at the recto-sigmoid colon -Path: hyperplastic -repeat in 3 years.     Patient placed on recall for TCS in February 2026.    Office visit 01/23/22. Noted to have history of recent DVT in September 2022 and had ongoing reflux which improved on Dexilant and Pepcid nightly.  Upper GI series negative.  Had recently developed IDA in the setting of heavy menses, following with heme-onc.  Reported recent colonoscopy 2022 as stated above.  Noted to have labs in February 2023 with iron deficiency and hemoglobin 7.9.  LFTs normal at that time.  At the time of her visit she denied heartburn but did report gurgling noise in her upper  abdomen and chest at night and some regurgitation.  Denied dysphagia, nausea, or vomiting.  Also with gas pains.  States she gained 20 pounds in the prior 9 months was having loose stools every day after meals.  Thought secondary to metformin given it started a few weeks prior.  Due to have shoulder surgery in June 2023.  She is advised to stop Carafate continue Dexilant once daily before breakfast and famotidine nightly.  Scheduled for EGD with Dr. Marletta Lor.   Repeat labs in March 2023 with ferritin 5.6, iron 20, hemoglobin 8.4.  Underwent endometrial ablation in March 2023.  Received 2 iron infusions after following with [facility].   EGD 02/05/22: -normal esophagus -small hiatal hernia -gastritis s/po biopsy -3 gastric polyps removed s/p biopsy.  -Continue dexilant 60 mg daily.    Patient called with c/o diarrhea and "capsules or polyps" seen within her stool..    Labs 08/11/2022: Normal LFTs, bilirubin, alk phos. Glucose 682, hemoglobin 15.4, MCV 78.6   Labs  08/21/22: Hgb 14.9, carbon monoxide levels 11.7 (H)   RUQ [place] 08/09/22:  -The echogenicity of the liver is increased. This is a nonspecific finding but is most commonly seen with fatty infiltration of the liver.  -There are a few non-mass-like areas of decreased attenuation  including adjacent to the gallbladder fossa, which are favored to  represent areas of focal fatty sparing. No discrete hepatic mass  lesion identified.  -Hepatomegaly  Last office visit 09/10/22.  Patient was taking omeprazole once daily for GERD, reported rare breakthrough symptoms and takes super fate as needed.   denied any weight loss or lack of appetite.  Did note some feelings with fatigue after ablation but also was having vision troubles which suspected to be related to her diabetes as well as her hypertension.  She had reported some diarrhea couple months prior with stool stately polyp/capsules and was having 6-7/day but was resolved at time of office  visit.  Having bowel movement daily, sometimes soft and sometimes solid.  Follow regularly with endocrinology.  BP high that day, 190s over 90s.  A1c in May 7.4.  Labs 03/11/2023: Hemoglobin 15.2, MCV 82.3, platelets 307, iron 147, saturation 36%, ferritin 64  Today: GERD: Doing okay. Takes the Carafate about 3 times per day and omeprazole once per day.   Diarrhea: Started a couple months ago. Going at least 5 times a day and correlates with meals. Mild cramping at times with this. Unable to say that certain foods trigger it. Can eat salad and then be in the bathroom. Usually within 30 minutes of a meal. Is afraid to go out and eat because of this. No blood in stool or black stool. Stools are mostly brown, stools look sandy. Never super watery. Has not tried anything over the counter.   No lack of appetite, weight loss. Has gained some weight.  Denies any abdominal pain.  Has had some shortness of breath and constantly feels lazy.    Current Outpatient Medications  Medication Sig Dispense Refill   ACCU-CHEK GUIDE test strip 3 (three) times daily.     Accu-Chek Softclix Lancets lancets 3 (three) times daily.     ARIPiprazole (ABILIFY) 5 MG tablet Take 5 mg by mouth daily.     atenolol (TENORMIN) 50 MG tablet Take 50 mg by mouth daily.     baclofen (LIORESAL) 10 MG tablet Take 10 mg by mouth 3 (three) times daily as needed (Back pain).     Blood Glucose Monitoring Suppl (ACCU-CHEK GUIDE ME) w/Device KIT See admin instructions.     chlorthalidone (HYGROTON) 25 MG tablet Take 25 mg by mouth every morning.     DULoxetine (CYMBALTA) 60 MG capsule Take 60 mg by mouth daily in the afternoon.     famotidine (PEPCID) 40 MG tablet Take 1 tablet by mouth once daily 90 tablet 0   gabapentin (NEURONTIN) 300 MG capsule Take 300 mg by mouth at bedtime.     insulin lispro (HUMALOG) 100 UNIT/ML injection Inject 6-12 Units into the skin 3 (three) times daily with meals.     meclizine (ANTIVERT) 25 MG tablet  Take 25 mg by mouth 3 (three) times daily as needed for dizziness.     omeprazole (PRILOSEC) 40 MG capsule Take 1 capsule (40 mg total) by mouth daily. 30 capsule 11   SEMGLEE, YFGN, 100 UNIT/ML Pen SMARTSIG:40 Unit(s) SUB-Q Daily     spironolactone (ALDACTONE) 25 MG tablet Take 12.5 mg by mouth daily.     sucralfate (CARAFATE)  1 g tablet Take 1 g by mouth daily as needed (Acid reflux).     traZODone (DESYREL) 50 MG tablet Take 50-100 mg by mouth at bedtime.     acetaminophen (TYLENOL) 325 MG tablet Take 650 mg by mouth every 6 (six) hours as needed for moderate pain.     Continuous Glucose Receiver (FREESTYLE LIBRE 3 READER) DEVI 1 each by Does not apply route as directed. (Patient not taking: Reported on 03/17/2023) 1 each EACH   Continuous Glucose Sensor (DEXCOM G7 SENSOR) MISC Use to continuously monitor glucose. Change sensor every 10 days. (Patient not taking: Reported on 03/17/2023) 3 each 2   Continuous Glucose Sensor (FREESTYLE LIBRE 3 SENSOR) MISC Place 1 sensor on the skin every 14 days. Use to check glucose continuously 6 each 1   insulin glargine (LANTUS) 100 UNIT/ML injection Inject 35 Units into the skin daily.     lisinopril (ZESTRIL) 40 MG tablet Take 40 mg by mouth daily. (Patient not taking: Reported on 03/17/2023)     oxyCODONE (ROXICODONE) 5 MG immediate release tablet Take 1 tablet (5 mg total) by mouth every 4 (four) hours as needed for severe pain. (Patient not taking: Reported on 03/17/2023) 30 tablet 0   No current facility-administered medications for this visit.    Past Medical History:  Diagnosis Date   Abnormal Pap smear of cervix    Anemia    issue with red blood cells being small   Anxiety    Arthritis    Carpal tunnel syndrome    right   Depression    Diabetes mellitus without complication (HCC)    DVT (deep venous thrombosis) (HCC) 03/2021   left arm   GERD (gastroesophageal reflux disease)    High blood pressure    History of hiatal hernia    Left  breast mass    Pre-diabetes     Past Surgical History:  Procedure Laterality Date   BIOPSY  02/05/2022   Procedure: BIOPSY;  Surgeon: Lanelle Bal, DO;  [facility];  Service: Endoscopy;;   COLONOSCOPY  2022   Megan Straughan: 7 polyps removed, has to return in one year   COLONOSCOPY  09/2021   Megan Straughan: 12 polyps removed   ESOPHAGOGASTRODUODENOSCOPY (EGD) WITH PROPOFOL N/A 02/05/2022   Procedure: ESOPHAGOGASTRODUODENOSCOPY (EGD) WITH PROPOFOL;  Surgeon: Lanelle Bal, DO;  [facility];  Service: Endoscopy;  Laterality: N/A;  2:00pm   KNEE ARTHROSCOPY Right 10/28/2022   Procedure: RIGHT KNEE ARTHROSCOPY, DEBRIDEMENT;  Surgeon: Cammy Copa, MD;  Location: [facility];  Service: Orthopedics;  Laterality: Right;   left shoulder arthroscopy  12/2020   SHOULDER ARTHROSCOPY WITH OPEN ROTATOR CUFF REPAIR AND DISTAL CLAVICLE ACROMINECTOMY Left 03/21/2022   Procedure: LEFT SHOULDER ARTHOSCOPY, DEBRIDEMENT,  MANIPULATION, ROTATOR CUFF REPAIR, ARTHROSCOPIC DISTAL CLAVICLE EXCISION;  Surgeon: Cammy Copa, MD;  Location: [facility];  Service: Orthopedics;  Laterality: Left;   TUBAL LIGATION      Family History  Problem Relation Age of Onset   Lung cancer Mother    Other Father        multiple issues, doesn't know history for sure   Prostate cancer Father    Stroke Father    Aneurysm Paternal Grandmother    Breast cancer Maternal Aunt    Prostate cancer Maternal Uncle    Alcoholism Maternal Uncle    Aneurysm Cousin        paternal side   Aneurysm Cousin  paternal side    Breast cancer Cousin        maternal side    High blood pressure Other        father's side    Cerebral aneurysm Other        cerebral, on father's side    Alcoholism Other        maternal side    Colon cancer Neg Hx     Allergies as of 03/17/2023 - Review Complete 03/17/2023  Allergen Reaction Noted   Ferumoxytol Other (See Comments) 12/18/2021    Hydrochlorothiazide Other (See Comments) 04/02/2017    Social History   Socioeconomic History   Marital status: Single    Spouse name: Not on file   Number of children: 3   Years of education: Not on file   Highest education level: Some college, no degree  Occupational History   Not on file  Tobacco Use   Smoking status: Every Day    Current packs/day: 0.50    Types: Cigarettes   Smokeless tobacco: Never   Tobacco comments:    Working on cutting down on smoking. Down to 8 cigarrettes a day now.  Vaping Use   Vaping status: Never Used  Substance and Sexual Activity   Alcohol use: Not Currently   Drug use: Not Currently   Sexual activity: Yes    Birth control/protection: None  Other Topics Concern   Not on file  Social History Narrative   Lives at home with 2 daughters   Right handed   Caffeine: about 2-3 per day    Social Determinants of Health   Financial Resource Strain: Low Risk  (10/25/2021)   Received from [facility], [facility]   Overall Financial Resource Strain (CARDIA)    Difficulty of Paying Living Expenses: Not hard at all  Food Insecurity: No Food Insecurity (10/25/2021)   Received from [facility], [facility]   Hunger Vital Sign    Worried About Running Out of Food in the Last Year: Never true    Ran Out of Food in the Last Year: Never true  Transportation Needs: No Transportation Needs (10/25/2021)   Received from [facility], [facility]   [facility] - Transportation    Lack of Transportation (Medical): No    Lack of Transportation (Non-Medical): No  Physical Activity: Inactive (02/05/2023)   Received from [facility], [facility]   Exercise Vital Sign    Days of Exercise per Week: 0 days    Minutes of Exercise per Session: 0 min  Stress: Stress Concern Present (02/05/2023)   Received from [facility], [facility] - Occupational Stress Questionnaire    Feeling of Stress :  To some extent  Social Connections: Moderately Isolated (10/25/2021)   Received from [facility], [facility]   Social Connection and Isolation Panel [NHANES]    Frequency of Communication with Friends and Family: More than three times a week    Frequency of Social Gatherings with Friends and Family: Never    Attends Religious Services: More than 4 times per year    Active Member of [employer] or Organizations: No    Attends Banker Meetings: Never    Marital Status: Never married   Review of Systems   Gen: + weight gain. Denies fever, chills, anorexia. Denies fatigue, weakness, weight loss.  CV: Denies chest pain, palpitations, syncope, peripheral edema, and  claudication. Resp: + intermittent dyspnea. Denies dyspnea at rest, cough, wheezing, coughing up blood, and pleurisy. GI: See HPI Derm: Denies rash, itching, dry skin Psych: + depression. Denies anxiety, memory loss, confusion. No homicidal or suicidal ideation.  Heme: Denies bruising, bleeding, and enlarged lymph nodes.  Physical Exam   BP 137/88 (BP Location: Right Arm, Patient Position: Sitting, Cuff Size: Large)   Pulse 71   Temp 97.6 F (36.4 C) (Temporal)   Ht 5\' 1"  (1.549 m)   Wt 191 lb 6.4 oz (86.8 kg)   LMP 05/01/2021 (Approximate)   SpO2 97%   BMI 36.16 kg/m   General:   Alert and oriented. No distress noted. Pleasant and cooperative.  Head:  Normocephalic and atraumatic. Eyes:  Conjuctiva clear without scleral icterus. Mouth:  Oral mucosa pink and moist. Good dentition. No lesions. Abdomen:  +BS, soft, non-tender and non-distended. No rebound or guarding. No HSM or masses noted. Rectal: deferred Msk:  Symmetrical without gross deformities. Normal posture. Extremities:  Without edema. Neurologic:  Alert and  oriented x4 Psych:  Alert and cooperative. Normal mood and affect.   Assessment  Suzanne Lawrence is a 52 y.o. female with a history of anxiety, diabetes, GERD, DVT, HTN, arthritis,  depression, GERD presenting today for follow-up of diarrhea.   GERD: Fairly controlled with once daily PPI and Carafate 3 times daily however she has been on Carafate long-term therefore we discussed reducing Carafate use to as needed and increasing PPI to twice daily.  Given some diarrhea we will change from omeprazole to pantoprazole which she will take twice daily.   Diarrhea: Patient has some episodes of diarrhea prior to her office visit in January that had transiently resolved and then resumed again a couple months ago.  Symptoms occur postprandially without any identifiable specific food triggers other than salad, but occurs with anything she eats.  Stools are [place] 5-6 in nature and normal brown color.  She denies any abdominal pain other than some mild cramping that occurs with defecation.  Symptoms are occurring fairly urgently.  Given her diabetes there is concern for pancreatic insufficiency therefore we will assess with fecal elastase and for complete workup we will also check TSH and celiac serologies.  After she provide stool sample advised her that she can use Imodium 1 mg 1-2 times daily for diarrhea unless pancreatic enzymes are needed.  Anemia: History of anemia.  Recent iron studies within normal limits and hemoglobin slightly elevated.  EGD and colonoscopy up-to-date.  Denies any melena, BRBPR, hematemesis.  PLAN   Pantoprazole 40 mg twice daily, stop omeprazole Reduce carafate to as needed instead of TID Fecal elastase, TSH, celiac serologies Imodium 1 mg 1-2 times daily.  Low fat diet Follow up in 2 months    Brooke Bonito, MSN, FNP-BC, AGACNP-BC [facility]

## 2023-03-14 NOTE — Progress Notes (Unsigned)
[facility] [address], [place] [address]   CLINIC:  Medical Oncology/Hematology  PCP:  Donetta Potts, MD [address] [place] [address] [phone]   REASON FOR VISIT: Iron deficiency anemia, alpha thalassemia trait, and erythrocytosis.   PRIOR THERAPY: Iron tablets   CURRENT THERAPY: Intermittent IV iron (last on 12/18/2021 and 12/26/2021)  INTERVAL HISTORY:   Ms. Suzanne Lawrence 52 y.o. female returns for routine follow-up of  iron deficiency anemia.  She was last seen by Rojelio Brenner PA-C on 09/09/2022.  At today's visit, she reports feeling fair.  Since her last visit, she had right knee arthroscopy for meniscal tear, but denies any other surgeries, hospitalizations, or major changes in her health status.  She has not had any major menstrual bleeding since uterine ablation in March 2023, but had one episode "minor vaginal bleeding" in summer 2023.  No rectal bleeding or melena; she has dark green bowel movements from time to time.   She still has some residual fatigue with energy about 25%, feels "sluggish and drowsy."  She has some chronic dyspnea on exertion from "being out of shape."  She reports dizziness/lightheadedness from vertigo, as well as some palpitations.  She denies any headaches, chest pain, or syncope.  She is not currently taking any iron supplement.  She has 25% energy and 75% appetite.  Previously reported weight loss has resolved and she has started to regain some weight after being started on insulin.   ASSESSMENT & PLAN:  1.  Iron deficiency anemia: - She previously had menorrhagia, s/p endometrial ablation in March 2023.  Since uterine ablation, she has only had occasional light spotty - She denies bright red blood per rectum or melena. - Colonoscopy (10/03/2021): Removal of 2 sessile polyps in the descending colon, 2 sessile polyps in the sigmoid colon, 4 sessile polyps in the sigmoid colon, 3 sessile polyps in the rectosigmoid  colon.  Pathology report shows all removed polyps to be hyperplastic with no dysplasia or malignancy. - EGD (02/05/2022): Small hiatal hernia, gastritis, gastric polyps x3 - Failed to improve on oral iron.  Iron supplement was stopped in October 2023 by her PCP - She received IV Feraheme x2 (12/18/2021 and 12/26/2021).  She had mild INFUSION REACTION to first dose of Feraheme, was able to complete after receiving IV steroids.  Tolerated her second IV Feraheme infusion well with premedications. - Most recent labs (03/11/2023): Hgb 15.2/HCT 46.0, ferritin 64, iron saturation 36% - PLAN: Recommend restarting iron with ferrous sulfate 325 mg daily.  Would consider IV iron if no improvement on oral iron.   - CBC/D and iron panel in 6 months    2.  Alpha thalassemia trait - Persistent microcytosis and elevated RBC, regardless of hemoglobin or iron levels. - Hemoglobin fractionation cascade was normal (03/15/2022), although this does not exclude alpha thalassemia trait.  Copper levels were normal. - Alpha thalassemia genotype was POSITIVE for alpha thalassemia trait - PLAN: We discussed diagnosis of alpha thalassemia trait (unaffected carrier state).     3.  Erythrocytosis - Elevated hemoglobin since July 2023 with Hgb 15.3/HCT 47.4 (03/08/2022) and Hgb 16.0/HCT 47.8 (08/07/2022) - Patient smokes 0.5 PPD cigarettes - No known history of sleep apnea, but she has been told that she snores. - No obvious carbon monoxide exposure, but she has gas heat at home, along with CO monitor.   - Hematology workup (08/21/2022): Elevated blood carbon monoxide 11.7% (usually <9.9% in smokers) Mildly elevated erythropoietin 23.7  Negative JAK2, CALR, MPL - Most recent CBC (03/11/2023) with RBC 5.59, Hgb 15.2, hematocrit 46.0 - She may have some diuretic effect from her chlorthalidone which could be causing hemoconcentration.  Additionally, she has had severe hyperglycemia for the past several months (blood sugars in the  400s), which could also be causing some diuretic effect. 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  Elevated RBC also from alpha thalassemia trait.   - PLAN: Previously referred for sleep study, but their office was unable to get in touch with patient.  Patient encouraged to call back to sleep center to schedule consultation. - Recommend smoking cessation  - CBC in 6 months    4.  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  She had exposure to chemicals including heptane and toluene. - She is current active smoker, 1 pack every 2 to 3 days for 25 years.  (Does not qualify for LCS/LDCT chest due to high pack-year history <20) - No family history of severe anemia or sickle cell disease.  Mother had lung cancer.  Father had prostate and colon cancer.  Maternal aunt had breast cancer.  Maternal and paternal cousins had breast cancer.  Maternal uncle had colon cancer.  PLAN SUMMARY: >> Labs in 6 months (CBC/D, ferritin, iron/TIBC) >> OFFICE visit 1 week after labs     REVIEW OF SYSTEMS:   Review of Systems  Constitutional:  Positive for fatigue. Negative for appetite change, chills, diaphoresis, fever and unexpected weight change.  HENT:   Negative for lump/mass and nosebleeds.   Eyes:  Negative for eye problems.  Respiratory:  Positive for shortness of breath (with exertion). Negative for cough and hemoptysis.   Cardiovascular:  Positive for palpitations. Negative for chest pain and leg swelling.  Gastrointestinal:  Positive for diarrhea and nausea. Negative for abdominal pain, blood in stool, constipation and vomiting.  Genitourinary:  Negative for hematuria.   Musculoskeletal:  Positive for back pain.  Skin: Negative.   Neurological:  Positive for numbness. Negative for dizziness, headaches and  light-headedness.  Hematological:  Does not bruise/bleed easily.  Psychiatric/Behavioral:  Positive for sleep disturbance. Negative for depression. The patient is not nervous/anxious.      PHYSICAL EXAM:  ECOG PERFORMANCE STATUS: 1 - Symptomatic but completely ambulatory  Vitals:   03/17/23 1434  BP: (!) 147/81  Pulse: 71  Resp: 17  Temp: 98.6 F (37 C)  SpO2: 100%   Filed Weights   03/17/23 1434  Weight: 190 lb 14.7 oz (86.6 kg)   Physical Exam Constitutional:      Appearance: Normal appearance. She is obese.  Cardiovascular:     Heart sounds: Normal heart sounds.  Pulmonary:     Breath sounds: Normal breath sounds.  Neurological:     General: No focal deficit present.     Mental Status: Mental status is at baseline.  Psychiatric:        Behavior: Behavior normal. Behavior is cooperative.     PAST MEDICAL/SURGICAL HISTORY:  Past Medical History:  Diagnosis Date   Abnormal Pap smear of cervix    Anemia    issue with red blood cells being small   Anxiety    Arthritis    Carpal tunnel syndrome    right   Depression    Diabetes mellitus without complication (HCC)    DVT (deep venous thrombosis) (HCC) 03/2021  left arm   GERD (gastroesophageal reflux disease)    High blood pressure    History of hiatal hernia    Left breast mass    Pre-diabetes    Past Surgical History:  Procedure Laterality Date   BIOPSY  02/05/2022   Procedure: BIOPSY;  Surgeon: Lanelle Bal, DO;  [facility];  Service: Endoscopy;;   COLONOSCOPY  2022   Megan Straughan: 7 polyps removed, has to return in one year   COLONOSCOPY  09/2021   Megan Straughan: 12 polyps removed   ESOPHAGOGASTRODUODENOSCOPY (EGD) WITH PROPOFOL N/A 02/05/2022   Procedure: ESOPHAGOGASTRODUODENOSCOPY (EGD) WITH PROPOFOL;  Surgeon: Lanelle Bal, DO;  [facility];  Service: Endoscopy;  Laterality: N/A;  2:00pm   KNEE ARTHROSCOPY Right 10/28/2022   Procedure: RIGHT KNEE ARTHROSCOPY,  DEBRIDEMENT;  Surgeon: Cammy Copa, MD;  Location: [facility];  Service: Orthopedics;  Laterality: Right;   left shoulder arthroscopy  12/2020   SHOULDER ARTHROSCOPY WITH OPEN ROTATOR CUFF REPAIR AND DISTAL CLAVICLE ACROMINECTOMY Left 03/21/2022   Procedure: LEFT SHOULDER ARTHOSCOPY, DEBRIDEMENT,  MANIPULATION, ROTATOR CUFF REPAIR, ARTHROSCOPIC DISTAL CLAVICLE EXCISION;  Surgeon: Cammy Copa, MD;  Location: [facility];  Service: Orthopedics;  Laterality: Left;   TUBAL LIGATION      SOCIAL HISTORY:  Social History   Socioeconomic History   Marital status: Single    Spouse name: Not on file   Number of children: 3   Years of education: Not on file   Highest education level: Some college, no degree  Occupational History   Not on file  Tobacco Use   Smoking status: Every Day    Current packs/day: 0.50    Types: Cigarettes   Smokeless tobacco: Never   Tobacco comments:    Working on cutting down on smoking. Down to 8 cigarrettes a day now.  Vaping Use   Vaping status: Never Used  Substance and Sexual Activity   Alcohol use: Not Currently   Drug use: Not Currently   Sexual activity: Yes    Birth control/protection: None  Other Topics Concern   Not on file  Social History Narrative   Lives at home with 2 daughters   Right handed   Caffeine: about 2-3 per day    Social Determinants of Health   Financial Resource Strain: Low Risk  (10/25/2021)   Received from [facility], [facility]   Overall Financial Resource Strain (CARDIA)    Difficulty of Paying Living Expenses: Not hard at all  Food Insecurity: No Food Insecurity (10/25/2021)   Received from [facility], [facility]   Hunger Vital Sign    Worried About Running Out of Food in the Last Year: Never true    Ran Out of Food in the Last Year: Never true  Transportation Needs: No Transportation Needs (10/25/2021)   Received from [facility], [facility]   [facility] - Transportation     Lack of Transportation (Medical): No    Lack of Transportation (Non-Medical): No  Physical Activity: Inactive (02/05/2023)   Received from [employer], [facility]   Exercise Vital Sign    Days of Exercise per Week: 0 days    Minutes of Exercise per Session: 0 min  Stress: Stress Concern Present (02/05/2023)   Received from [facility], [facility] - Occupational Stress Questionnaire    Feeling of Stress : To some  extent  Social Connections: Moderately Isolated (10/25/2021)   Received from [facility], [facility]   Social Connection and Isolation Panel [NHANES]    Frequency of Communication with Friends and Family: More than three times a week    Frequency of Social Gatherings with Friends and Family: Never    Attends Religious Services: More than 4 times per year    Active Member of [employer] or Organizations: No    Attends Banker Meetings: Never    Marital Status: Never married  Intimate Partner Violence: Not At Risk (02/05/2023)   Received from [facility], [facility]   Humiliation, Afraid, Rape, and Kick questionnaire    Fear of Current or Ex-Partner: No    Emotionally Abused: No    Physically Abused: No    Sexually Abused: No    FAMILY HISTORY:  Family History  Problem Relation Age of Onset   Lung cancer Mother    Other Father        multiple issues, doesn't know history for sure   Prostate cancer Father    Stroke Father    Aneurysm Paternal Grandmother    Breast cancer Maternal Aunt    Prostate cancer Maternal Uncle    Alcoholism Maternal Uncle    Aneurysm Cousin        paternal side   Aneurysm Cousin        paternal side    Breast cancer Cousin        maternal side    High blood pressure Other        father's side    Cerebral aneurysm Other        cerebral, on father's side    Alcoholism Other        maternal side    Colon cancer Neg Hx     CURRENT MEDICATIONS:   Outpatient Encounter Medications as of 03/17/2023  Medication Sig Note   ACCU-CHEK GUIDE test strip 3 (three) times daily.    Accu-Chek Softclix Lancets lancets 3 (three) times daily.    acetaminophen (TYLENOL) 325 MG tablet Take 650 mg by mouth every 6 (six) hours as needed for moderate pain.    ARIPiprazole (ABILIFY) 5 MG tablet Take 5 mg by mouth daily.    atenolol (TENORMIN) 50 MG tablet Take 50 mg by mouth daily.    baclofen (LIORESAL) 10 MG tablet Take 10 mg by mouth 3 (three) times daily as needed (Back pain).    Blood Glucose Monitoring Suppl (ACCU-CHEK GUIDE ME) w/Device KIT See admin instructions.    chlorthalidone (HYGROTON) 25 MG tablet Take 25 mg by mouth every morning.    Continuous Glucose Receiver (FREESTYLE LIBRE 3 READER) DEVI 1 each by Does not apply route as directed.    Continuous Glucose Sensor (DEXCOM G7 SENSOR) MISC Use to continuously monitor glucose. Change sensor every 10 days.    DULoxetine (CYMBALTA) 60 MG capsule Take 60 mg by mouth daily in the afternoon.    famotidine (PEPCID) 40 MG tablet Take 1 tablet by mouth once daily    gabapentin (NEURONTIN) 300 MG capsule Take 300 mg by mouth at bedtime.    insulin glargine (LANTUS) 100 UNIT/ML injection Inject 35 Units into the skin daily. 12/25/2022: Patient is using the [facility]   insulin lispro (HUMALOG) 100 UNIT/ML injection Inject 6-12 Units into the skin 3 (three) times daily with meals.    lisinopril (ZESTRIL) 40 MG tablet Take 40 mg by mouth daily.  meclizine (ANTIVERT) 25 MG tablet Take 25 mg by mouth 3 (three) times daily as needed for dizziness.    oxyCODONE (ROXICODONE) 5 MG immediate release tablet Take 1 tablet (5 mg total) by mouth every 4 (four) hours as needed for severe pain.    pantoprazole (PROTONIX) 40 MG tablet Take 1 tablet (40 mg total) by mouth 2 (two) times daily before a meal.    SEMGLEE, YFGN, 100 UNIT/ML Pen SMARTSIG:40 Unit(s) SUB-Q Daily    spironolactone (ALDACTONE) 25 MG tablet Take 12.5  mg by mouth daily.    sucralfate (CARAFATE) 1 g tablet Take 1 g by mouth daily as needed (Acid reflux).    traZODone (DESYREL) 50 MG tablet Take 50-100 mg by mouth at bedtime.    [DISCONTINUED] Continuous Glucose Sensor (FREESTYLE LIBRE 3 SENSOR) MISC Place 1 sensor on the skin every 14 days. Use to check glucose continuously    [DISCONTINUED] omeprazole (PRILOSEC) 40 MG capsule Take 1 capsule (40 mg total) by mouth daily.    No facility-administered encounter medications on file as of 03/17/2023.    ALLERGIES:  Allergies  Allergen Reactions   Ferumoxytol Other (See Comments)    Hot flash, nausea, chest pressure    Hydrochlorothiazide Other (See Comments)     "urinary frequency"; was going every 20 minutes and was causing problems at work    LABORATORY DATA:  I have reviewed the labs as listed.  CBC    Component Value Date/Time   WBC 9.2 03/11/2023 1333   RBC 5.59 (H) 03/11/2023 1333   HGB 15.2 (H) 03/11/2023 1333   HCT 46.0 03/11/2023 1333   PLT 307 03/11/2023 1333   MCV 82.3 03/11/2023 1333   MCH 27.2 03/11/2023 1333   MCHC 33.0 03/11/2023 1333   RDW 13.5 03/11/2023 1333   LYMPHSABS 3.3 03/11/2023 1333   MONOABS 0.7 03/11/2023 1333   EOSABS 0.2 03/11/2023 1333   BASOSABS 0.1 03/11/2023 1333      Latest Ref Rng & Units 10/22/2022   12:33 PM 08/09/2022   12:58 PM 08/06/2022   12:00 AM  CMP  Glucose 70 - 99 mg/dL 99  678    BUN 6 - 20 mg/dL 9  9  6       Creatinine 0.44 - 1.00 mg/dL 9.38  1.01  0.8      Sodium 135 - 145 mmol/L 136  132    Potassium 3.5 - 5.1 mmol/L 3.8  3.9    Chloride 98 - 111 mmol/L 97  95  91      CO2 22 - 32 mmol/L 29  24    Calcium 8.9 - 10.3 mg/dL 9.2  9.3    AST 13 - 35   67      ALT 7 - 35 U/L   63         This result is from an external source.    DIAGNOSTIC IMAGING:  I have independently reviewed the relevant imaging and discussed with the patient.   WRAP UP:  All questions were answered. The patient knows to call the clinic with  any problems, questions or concerns.  Medical decision making: Moderate  Time spent on visit: I spent 20 minutes counseling the patient face to face. The total time spent in the appointment was 30 minutes and more than 50% was on counseling.  Carnella Guadalajara, PA-C  03/17/23 3:43 PM

## 2023-03-15 ENCOUNTER — Encounter: Payer: Self-pay | Admitting: Surgical

## 2023-03-15 MED ORDER — BUPIVACAINE HCL 0.25 % IJ SOLN
9.0000 mL | INTRAMUSCULAR | Status: AC | PRN
  Administered 2023-03-10: 9 mL via INTRA_ARTICULAR

## 2023-03-15 MED ORDER — LIDOCAINE HCL 1 % IJ SOLN
5.0000 mL | INTRAMUSCULAR | Status: AC | PRN
  Administered 2023-03-10: 5 mL

## 2023-03-15 NOTE — Progress Notes (Signed)
Office Visit Note   Patient: Suzanne Lawrence           Date of Birth: 1971/07/27           MRN: [id number] Visit Date: 03/10/2023 Requested by: Donetta Potts, MD [address] [place],  [place] [address] PCP: Donetta Potts, MD  Subjective: Chief Complaint  Patient presents with   Right Knee - Pain    HPI: Suzanne Lawrence is a 52 y.o. female who presents to the office reporting left shoulder pain.  Patient has history of left rotator cuff repair and arthroscopic distal clavicle excision by Dr. August Saucer in July 2023.  Initially did well from the surgery but she states that she has had increased pain in the shoulder that initially responded great to glenohumeral injection that was administered in April 2024 with 2 months of 80% relief.  He now reports that injection has worn off but it is too soon to repeat another cortisone injection.  She has pain localizing to the anterior and lateral aspects of the shoulder.  Pain will travel down into the bicep region.  Difficulty laying on that side at night.  She also feels weak in the arm at times has difficulty lifting her arm over her head..                ROS: All systems reviewed are negative as they relate to the chief complaint within the history of present illness.  Patient denies fevers or chills.  Assessment & Plan: Visit Diagnoses:  1. Status post rotator cuff repair     Plan: Patient is a 52 year old female who presents for evaluation of left shoulder pain.  Here today for Toradol injection but on exam, she does have some rotator cuff weakness that is concerning for rotator cuff repair.  She has Toradol injection administered today and patient overall tolerated this well without complication.  With her external rotation weakness and positive external rotation lag sign, if this pain does not improve, may need to consider further imaging of the shoulder to evaluate for retear of the rotator cuff.  Follow-up in 4 weeks for clinical recheck  with Dr. August Saucer.  Follow-Up Instructions: No follow-ups on file.   Orders:  Orders Placed This Encounter  Procedures   US Guided Needle Placement - No Linked Charges   No orders of the defined types were placed in this encounter.     Procedures: Large Joint Inj: L glenohumeral on 03/10/2023 12:29 PM Details: 22 G 3.5 in needle, posterior approach Medications: 5 mL lidocaine 1 %; 9 mL bupivacaine 0.25 % Outcome: tolerated well, no immediate complications  9 cc of bupivacaine mixed with 1 cc Toradol Procedure, treatment alternatives, risks and benefits explained, specific risks discussed. Consent was given by the patient. Patient was prepped and draped in the usual sterile fashion.       Clinical Data: No additional findings.  Objective: Vital Signs: LMP 05/01/2021 (Approximate)   Physical Exam:  Constitutional: Patient appears well-developed HEENT:  Head: Normocephalic Eyes:EOM are normal Neck: Normal range of motion Cardiovascular: Normal rate Pulmonary/chest: Effort normal Neurologic: Patient is alert Skin: Skin is warm Psychiatric: Patient has normal mood and affect  Ortho Exam: Ortho exam demonstrates left shoulder with 40 degrees X rotation, 90 degrees abduction, 140 degrees forward elevation passively.  She has positive external rotation lag sign.  She has 4/5 external rotation strength of the left shoulder relative to 5/5 on the right.  Negative Hornblower sign.  Incisions  are well-healed.  There is some crepitus noted with passive motion of the shoulder.  Axillary nerve intact with deltoid firing.  Intact subscap strength rated 5/5.  Specialty Comments:  No specialty comments available.  Imaging: No results found.   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 01/25/2020   High blood pressure    Depression    Carpal tunnel syndrome    Anxiety    Past Medical History:  Diagnosis Date   Abnormal Pap smear of cervix    Anemia    issue with red blood cells being small   Anxiety    Arthritis    Carpal tunnel syndrome    right   Depression    Diabetes mellitus without complication (HCC)    DVT (deep venous thrombosis) (HCC) 03/2021   left arm   GERD (gastroesophageal reflux disease)    High blood pressure    History of hiatal hernia    Left breast mass    Pre-diabetes     Family History  Problem Relation Age of Onset   Lung cancer Mother    Other Father        multiple issues, doesn't know history for sure   Prostate cancer Father    Stroke Father    Aneurysm Paternal Grandmother    Breast cancer Maternal Aunt    Prostate cancer Maternal Uncle    Alcoholism Maternal Uncle    Aneurysm Cousin        paternal side   Aneurysm Cousin        paternal side    Breast cancer Cousin        maternal side    High blood pressure Other        father's side    Cerebral aneurysm Other        cerebral, on father's side    Alcoholism Other        maternal side    Colon cancer Neg Hx     Past Surgical History:  Procedure Laterality Date   BIOPSY  02/05/2022   Procedure: BIOPSY;  Surgeon: Lanelle Bal, DO;  [facility];  Service: Endoscopy;;   COLONOSCOPY  2022   Megan Straughan: 7 polyps removed, has to return in one year   COLONOSCOPY  09/2021   Megan Straughan: 12 polyps removed   ESOPHAGOGASTRODUODENOSCOPY (EGD) WITH PROPOFOL N/A 02/05/2022   Procedure: ESOPHAGOGASTRODUODENOSCOPY (EGD) WITH PROPOFOL;  Surgeon: Lanelle Bal, DO;  [facility];  Service: Endoscopy;  Laterality: N/A;  2:00pm   KNEE ARTHROSCOPY  Right 10/28/2022   Procedure: RIGHT KNEE ARTHROSCOPY, DEBRIDEMENT;  Surgeon: Cammy Copa, MD;  Location: [facility];  Service: Orthopedics;  Laterality: Right;   left shoulder arthroscopy  12/2020   SHOULDER ARTHROSCOPY WITH OPEN ROTATOR CUFF REPAIR AND DISTAL CLAVICLE ACROMINECTOMY Left 03/21/2022   Procedure: LEFT SHOULDER ARTHOSCOPY, DEBRIDEMENT,  MANIPULATION, ROTATOR CUFF REPAIR, ARTHROSCOPIC DISTAL CLAVICLE EXCISION;  Surgeon: Cammy Copa, MD;  Location: [facility];  Service: Orthopedics;  Laterality: Left;   TUBAL LIGATION  Social History   Occupational History   Not on file  Tobacco Use   Smoking status: Every Day    Current packs/day: 0.50    Types: Cigarettes   Smokeless tobacco: Never   Tobacco comments:    Working on cutting down on smoking. Down to 8 cigarrettes a day now.  Vaping Use   Vaping status: Never Used  Substance and Sexual Activity   Alcohol use: Not Currently   Drug use: Not Currently   Sexual activity: Yes    Birth control/protection: None

## 2023-03-17 ENCOUNTER — Inpatient Hospital Stay: Payer: BC Managed Care – PPO | Admitting: Physician Assistant

## 2023-03-17 ENCOUNTER — Encounter: Payer: Self-pay | Admitting: Gastroenterology

## 2023-03-17 ENCOUNTER — Other Ambulatory Visit: Payer: Self-pay | Admitting: Gastroenterology

## 2023-03-17 ENCOUNTER — Ambulatory Visit (INDEPENDENT_AMBULATORY_CARE_PROVIDER_SITE_OTHER): Payer: BC Managed Care – PPO | Admitting: Gastroenterology

## 2023-03-17 ENCOUNTER — Ambulatory Visit: Payer: BC Managed Care – PPO | Admitting: Physician Assistant

## 2023-03-17 VITALS — BP 147/81 | HR 71 | Temp 98.6°F | Resp 17 | Wt 190.9 lb

## 2023-03-17 VITALS — BP 137/88 | HR 71 | Temp 97.6°F | Ht 61.0 in | Wt 191.4 lb

## 2023-03-17 DIAGNOSIS — R197 Diarrhea, unspecified: Secondary | ICD-10-CM | POA: Diagnosis not present

## 2023-03-17 DIAGNOSIS — D751 Secondary polycythemia: Secondary | ICD-10-CM

## 2023-03-17 DIAGNOSIS — D509 Iron deficiency anemia, unspecified: Secondary | ICD-10-CM | POA: Diagnosis not present

## 2023-03-17 DIAGNOSIS — D500 Iron deficiency anemia secondary to blood loss (chronic): Secondary | ICD-10-CM | POA: Diagnosis not present

## 2023-03-17 DIAGNOSIS — K219 Gastro-esophageal reflux disease without esophagitis: Secondary | ICD-10-CM

## 2023-03-17 MED ORDER — PANTOPRAZOLE SODIUM 40 MG PO TBEC: 40.0000 mg | DELAYED_RELEASE_TABLET | Freq: Two times a day (BID) | ORAL | 2 refills | Status: DC

## 2023-03-17 NOTE — Patient Instructions (Signed)
[facility] at [facility] **VISIT SUMMARY & IMPORTANT INSTRUCTIONS **   You were seen today by Rojelio Brenner PA-C for your follow-up visit.    IRON DEFICIENCY ANEMIA: Your iron levels are sightly low.  You should restart your iron pill (ferrous sulfate 325 mg) daily.  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  However, you also have a normal copy of that same piece of DNA, so your body is able to compensate with plenty of healthy hemoglobin protein to make up for the abnormal protein.  ELEVATED HEMOGLOBIN Your elevated hemoglobin is likely due to your tobacco use.  Continue to work on smoking cessation! We will also send you for sleep study, because sleep apnea can also cause elevated hemoglobin.  Please call [facility] [phone] to schedule your first appointment.  FOLLOW-UP APPOINTMENT: Labs in 6 months with office visit after  ** Thank you for trusting me with your healthcare!  I strive to provide all of my patients with quality care at each visit.  If you receive a survey for this visit, I would be so grateful to you for taking the time to provide feedback.  Thank you in advance!  ~ Lorilynn Lehr                   Dr. Doreatha Massed   &   Rojelio Brenner, PA-C   - - - - - - - - - - - - - - - - - -    Thank you for choosing [facility] at [facility] to provide your oncology and hematology care.  To afford each patient quality time with our provider, please arrive at least 15 minutes before your scheduled appointment time.   If you have a lab appointment with the Cancer Center please come in thru the Main Entrance and check in at the main information desk.  You need to re-schedule your appointment should you arrive 10 or more minutes late.  We strive  to give you quality time with our providers, and arriving late affects you and other patients whose appointments are after yours.  Also, if you no show three or more times for appointments you may be dismissed from the clinic at the providers discretion.     Again, thank you for choosing [facility].  Our hope is that these requests will decrease the amount of time that you wait before being seen by our physicians.       _____________________________________________________________  Should you have questions after your visit to [facility], please contact our office at [phone] and follow the prompts.  Our office hours are 8:00 a.m. and 4:30 p.m. Monday - Friday.  Please note that voicemails left after 4:00 p.m. may not be returned until the following business day.  We are closed weekends and major holidays.  You do have access to a nurse 24-7, just call the main number to the clinic [phone] and do not press any options, hold on the line and a nurse will answer the phone.    For prescription refill requests, have your pharmacy contact our office and allow 72 hours.

## 2023-03-17 NOTE — Patient Instructions (Addendum)
Please have blood work completed at [facility].  We will call you with results once they have been received. Please allow 3-5 business days for review. 2 locations for [facility] in [place]:              1. [address], [place]              2. [address] [place], [place]   Please stop taking omeprazole and start taking pantoprazole.  You will take 1 tablet twice daily, 30 minutes prior to breakfast and dinner.  Follow a GERD diet:  Avoid fried, fatty, greasy, spicy, citrus foods. Avoid caffeine and carbonated beverages. Avoid chocolate. Try eating 4-6 small meals a day rather than 3 large meals. Do not eat within 3 hours of laying down. Prop head of bed up on wood or bricks to create a 6 inch incline.   In regards to your diarrhea I want you to go have this blood work completed as well as get the supplies to provide a stool sample.  I want you to try eating a low-fat diet.  After you give your stool sample you may take half a tablet of Imodium (1 mg) 1-2 times daily as needed for your diarrhea.  If no bowel movement in 24 hours please do not take a dose of imodium. (You can get this over the counter)  We will plan to follow-up in about 2 months, please reach out via MyChart or give the office a call if you are having any issues.  It was a pleasure to see you today. I want to create trusting relationships with patients. If you receive a survey regarding your visit,  I greatly appreciate you taking time to fill this out on paper or through your MyChart. I value your feedback.  Brooke Bonito, MSN, FNP-BC, AGACNP-BC [facility]

## 2023-03-18 LAB — GLIA (IGA/G) + TTG IGA

## 2023-03-19 LAB — TSH+FREE T4
Free T4: 0.93 ng/dL (ref 0.82–1.77)
TSH: 3.6 u[IU]/mL (ref 0.450–4.500)

## 2023-03-19 LAB — GLIA (IGA/G) + TTG IGA
Antigliadin Abs, IgA: 6 units (ref 0–19)
Gliadin IgG: 2 units (ref 0–19)

## 2023-03-19 LAB — IGA: IgA/Immunoglobulin A, Serum: 138 mg/dL (ref 87–352)

## 2023-03-20 LAB — PANCREATIC ELASTASE, FECAL: Pancreatic Elastase, Fecal: >800 ug Elast./g (ref >200)

## 2023-03-24 ENCOUNTER — Encounter: Payer: Self-pay | Admitting: *Deleted

## 2023-03-25 ENCOUNTER — Other Ambulatory Visit: Payer: Self-pay | Admitting: Internal Medicine

## 2023-03-25 ENCOUNTER — Telehealth: Payer: Self-pay | Admitting: Internal Medicine

## 2023-03-25 ENCOUNTER — Ambulatory Visit: Payer: BC Managed Care – PPO

## 2023-03-25 ENCOUNTER — Encounter: Payer: Self-pay | Admitting: Internal Medicine

## 2023-03-25 ENCOUNTER — Ambulatory Visit: Payer: BC Managed Care – PPO | Attending: Internal Medicine | Admitting: Internal Medicine

## 2023-03-25 VITALS — BP 132/82 | HR 80 | Ht 61.0 in | Wt 191.0 lb

## 2023-03-25 DIAGNOSIS — Z720 Tobacco use: Secondary | ICD-10-CM | POA: Diagnosis not present

## 2023-03-25 DIAGNOSIS — R0609 Other forms of dyspnea: Secondary | ICD-10-CM

## 2023-03-25 DIAGNOSIS — R002 Palpitations: Secondary | ICD-10-CM | POA: Diagnosis not present

## 2023-03-25 DIAGNOSIS — R0789 Other chest pain: Secondary | ICD-10-CM | POA: Diagnosis not present

## 2023-03-25 NOTE — Telephone Encounter (Signed)
Zio XT 7 days Dx: Palpitations

## 2023-03-25 NOTE — Progress Notes (Signed)
Cardiology Office Note  Date: 03/25/2023   ID: Sophonie Aring, DOB 09-26-1970, MRN [id number]  PCP:  Donetta Potts, MD  Cardiologist:  Marjo Bicker, MD Electrophysiologist:  None   Reason for Office Visit: DOE and chest pain eval   History of Present Illness: Suzanne Lawrence is a 52 y.o. female known to have HTN, DM 2 was referred to cardiology clinic for evaluation of DOE and chest pain.  Patient has ongoing DOE for 1 year especially with household chores and walking long distances. She also noticed weight gain recently and was diagnosed with diabetes mellitus type 2 in 12/23 after which she was started on insulin.  Has sharp chest pains lasting between 1 and 2 seconds, occurs few times per week and no relation with rest or exercise.  Ongoing palpitations almost daily, last between seconds and minutes.  Drinks 1 cup of coffee for many years.  Longstanding smoking history, has been smoking for over 20 years, smokes 30 cigarettes in 2 days.  Past Medical History:  Diagnosis Date   Abnormal Pap smear of cervix    Anemia    issue with red blood cells being small   Anxiety    Arthritis    Carpal tunnel syndrome    right   Depression    Diabetes mellitus without complication (HCC)    DVT (deep venous thrombosis) (HCC) 03/2021   left arm   GERD (gastroesophageal reflux disease)    High blood pressure    History of hiatal hernia    Left breast mass    Pre-diabetes     Past Surgical History:  Procedure Laterality Date   BIOPSY  02/05/2022   Procedure: BIOPSY;  Surgeon: Lanelle Bal, DO;  [facility];  Service: Endoscopy;;   COLONOSCOPY  2022   Megan Straughan: 7 polyps removed, has to return in one year   COLONOSCOPY  09/2021   Megan Straughan: 12 polyps removed   ESOPHAGOGASTRODUODENOSCOPY (EGD) WITH PROPOFOL N/A 02/05/2022   Procedure: ESOPHAGOGASTRODUODENOSCOPY (EGD) WITH PROPOFOL;  Surgeon: Lanelle Bal, DO;  [facility]: [facility];   Service: Endoscopy;  Laterality: N/A;  2:00pm   KNEE ARTHROSCOPY Right 10/28/2022   Procedure: RIGHT KNEE ARTHROSCOPY, DEBRIDEMENT;  Surgeon: Cammy Copa, MD;  Location: [facility];  Service: Orthopedics;  Laterality: Right;   left shoulder arthroscopy  12/2020   SHOULDER ARTHROSCOPY WITH OPEN ROTATOR CUFF REPAIR AND DISTAL CLAVICLE ACROMINECTOMY Left 03/21/2022   Procedure: LEFT SHOULDER ARTHOSCOPY, DEBRIDEMENT,  MANIPULATION, ROTATOR CUFF REPAIR, ARTHROSCOPIC DISTAL CLAVICLE EXCISION;  Surgeon: Cammy Copa, MD;  Location: [facility];  Service: Orthopedics;  Laterality: Left;   TUBAL LIGATION      Current Outpatient Medications  Medication Sig Dispense Refill   ACCU-CHEK GUIDE test strip 3 (three) times daily.     Accu-Chek Softclix Lancets lancets 3 (three) times daily.     acetaminophen (TYLENOL) 325 MG tablet Take 650 mg by mouth every 6 (six) hours as needed for moderate pain.     ARIPiprazole (ABILIFY) 5 MG tablet Take 5 mg by mouth daily.     atenolol (TENORMIN) 50 MG tablet Take 50 mg by mouth daily.     baclofen (LIORESAL) 10 MG tablet Take 10 mg by mouth 3 (three) times daily as needed (Back pain).     Blood Glucose Monitoring Suppl (ACCU-CHEK GUIDE ME) w/Device KIT See admin instructions.     chlorthalidone (HYGROTON) 25 MG tablet Take 25 mg by mouth  every morning.     Continuous Glucose Receiver (FREESTYLE LIBRE 3 READER) DEVI 1 each by Does not apply route as directed. 1 each EACH   Continuous Glucose Sensor (DEXCOM G7 SENSOR) MISC Use to continuously monitor glucose. Change sensor every 10 days. 3 each 2   DULoxetine (CYMBALTA) 60 MG capsule Take 60 mg by mouth daily in the afternoon.     famotidine (PEPCID) 40 MG tablet Take 1 tablet by mouth once daily 90 tablet 0   gabapentin (NEURONTIN) 300 MG capsule Take 300 mg by mouth at bedtime.     insulin glargine (LANTUS) 100 UNIT/ML injection Inject 35 Units into the skin daily.     insulin lispro (HUMALOG)  100 UNIT/ML injection Inject 6-12 Units into the skin 3 (three) times daily with meals.     lisinopril (ZESTRIL) 40 MG tablet Take 40 mg by mouth daily.     meclizine (ANTIVERT) 25 MG tablet Take 25 mg by mouth 3 (three) times daily as needed for dizziness.     oxyCODONE (ROXICODONE) 5 MG immediate release tablet Take 1 tablet (5 mg total) by mouth every 4 (four) hours as needed for severe pain. 30 tablet 0   pantoprazole (PROTONIX) 40 MG tablet Take 1 tablet (40 mg total) by mouth 2 (two) times daily before a meal. 60 tablet 2   SEMGLEE, YFGN, 100 UNIT/ML Pen SMARTSIG:40 Unit(s) SUB-Q Daily     spironolactone (ALDACTONE) 25 MG tablet Take 12.5 mg by mouth daily.     sucralfate (CARAFATE) 1 g tablet Take 1 g by mouth daily as needed (Acid reflux).     traZODone (DESYREL) 50 MG tablet Take 50-100 mg by mouth at bedtime.     No current facility-administered medications for this visit.   Allergies:  Ferumoxytol and Hydrochlorothiazide   Social History: The patient  reports that she has been smoking cigarettes. She has never used smokeless tobacco. She reports that she does not currently use alcohol. She reports that she does not currently use drugs.   Family History: The patient's family history includes Alcoholism in her maternal uncle and another family member; Aneurysm in her cousin, cousin, and paternal grandmother; Breast cancer in her cousin and maternal aunt; Cerebral aneurysm in an other family member; High blood pressure in an other family member; Lung cancer in her mother; Other in her father; Prostate cancer in her father and maternal uncle; Stroke in her father.   ROS:  Please see the history of present illness. Otherwise, complete review of systems is positive for none  All other systems are reviewed and negative.   Physical Exam: VS:  BP 132/82   Pulse 80   Ht 5\' 1"  (1.549 m)   Wt 191 lb (86.6 kg)   LMP 05/01/2021 (Approximate)   SpO2 98%   BMI 36.09 kg/m , BMI Body mass index  is 36.09 kg/m.  Wt Readings from Last 3 Encounters:  03/25/23 191 lb (86.6 kg)  03/17/23 190 lb 14.7 oz (86.6 kg)  03/17/23 191 lb 6.4 oz (86.8 kg)    General: Patient appears comfortable at rest. HEENT: Conjunctiva and lids normal, oropharynx clear with moist mucosa. Neck: Supple, no elevated JVP or carotid bruits, no thyromegaly. Lungs: Clear to auscultation, nonlabored breathing at rest. Cardiac: Regular rate and rhythm, no S3 or significant systolic murmur, no pericardial rub. Abdomen: Soft, nontender, no hepatomegaly, bowel sounds present, no guarding or rebound. Extremities: No pitting edema, distal pulses 2+. Skin: Warm and dry. Musculoskeletal: No kyphosis.  Neuropsychiatric: Alert and oriented x3, affect grossly appropriate.  Recent Labwork: 08/06/2022: ALT 63; AST 67 10/22/2022: BUN 9; Creatinine, Ser 0.79; Potassium 3.8; Sodium 136 03/11/2023: Hemoglobin 15.2; Platelets 307 03/17/2023: TSH 3.600  No results found for: "CHOL", "TRIG", "HDL", "CHOLHDL", "VLDL", "LDLCALC", "LDLDIRECT"   Assessment and Plan:  # DOE -Ongoing DOE x 1 year. Noticed DOE with household chores and walking long distance.  She was recently diagnosed with diabetes mellitus type 2 with HbA1c 12.6 in 12/23 with most recent HbA1c 7.4. She also noticed weight gain recently.  DOE being anginal equivalent and due to cardiac risk factors of DM 2, chronic smoking history, patient will benefit from [place].  Obtain 2D echocardiogram.  If Lexiscan shows no ischemia, patient is instructed to follow-up with PCP to initiate weight loss medication that will help improve DOE.  # Noncardiac chest pain -Sharp chest pains lasting for 1 to 2 seconds and no relation with rest or exercise.  # Palpitations -Ongoing palpitations, almost daily and last for a seconds to minute.  Obtain 1 week event monitor to rule out arrhythmias.  # Nicotine abuse -Smokes 30 cigarettes for 2 days.  Has been smoking for the last 20 years.   Smoking cessation instruction/counseling given:  counseled patient on the dangers of tobacco use, advised patient to stop smoking, and reviewed strategies to maximize success    I have spent a total of 45 minutes with patient reviewing chart, EKGs, labs and examining patient as well as establishing an assessment and plan that was discussed with the patient.  > 50% of time was spent in direct patient care.    Medication Adjustments/Labs and Tests Ordered: Current medicines are reviewed at length with the patient today.  Concerns regarding medicines are outlined above.   Tests Ordered: Orders Placed This Encounter  Procedures   NM Myocar Multi W/Spect W/Wall Motion / EF   ECHOCARDIOGRAM COMPLETE    Medication Changes: No orders of the defined types were placed in this encounter.   Disposition:  Follow up  pending results  Signed Amol Domanski Verne Spurr, MD, 03/25/2023 11:13 AM    [facility] at [facility] [address] [place], [place], [place] [address]

## 2023-03-25 NOTE — Telephone Encounter (Signed)
Checking percert on the following patient for testing scheduled at [facility].   LEXISCAN   04/02/2023

## 2023-03-25 NOTE — Patient Instructions (Signed)
Medication Instructions:  Your physician recommends that you continue on your current medications as directed. Please refer to the Current Medication list given to you today.   Labwork: None  Testing/Procedures: Your physician has requested that you have an echocardiogram. Echocardiography is a painless test that uses sound waves to create images of your heart. It provides your doctor with information about the size and shape of your heart and how well your heart's chambers and valves are working. This procedure takes approximately one hour. There are no restrictions for this procedure. Please do NOT wear cologne, perfume, aftershave, or lotions (deodorant is allowed). Please arrive 15 minutes prior to your appointment time.  Your physician has requested that you have a lexiscan myoview. For further information please visit [email or web address] Please follow instruction sheet, as given.  Your physician has recommended that you wear a Zio monitor.   This monitor is a medical device that records the heart's electrical activity. Doctors most often use these monitors to diagnose arrhythmias. Arrhythmias are problems with the speed or rhythm of the heartbeat. The monitor is a small device applied to your chest. You can wear one while you do your normal daily activities. While wearing this monitor if you have any symptoms to push the button and record what you felt. Once you have worn this monitor for the period of time provider prescribed (for 7 days), you will return the monitor device in the postage paid box. Once it is returned they will download the data collected and provide [place] with a report which the provider will then review and we will call you with those results. Important tips:  Avoid showering during the first 48 hours of wearing the monitor. Avoid excessive sweating to help maximize wear time. Do not submerge the device, no hot tubs, and no swimming pools. Keep any lotions or oils away from  the patch. After 48 hours you may shower with the patch on. Take brief showers with your back facing the shower head.  Do not remove patch once it has been placed because that will interrupt data and decrease adhesive wear time. Push the button when you have any symptoms and write down what you were feeling. Once you have completed wearing your monitor, remove and place into box which has postage paid and place in your outgoing mailbox.  If for some reason you have misplaced your box then call our office and we can provide another box and/or mail it off for you.   Follow-Up: Your physician recommends that you schedule a follow-up appointment in: Pending results  Any Other Special Instructions Will Be Listed Below (If Applicable).  If you need a refill on your cardiac medications before your next appointment, please call your pharmacy.

## 2023-03-27 ENCOUNTER — Ambulatory Visit (INDEPENDENT_AMBULATORY_CARE_PROVIDER_SITE_OTHER): Payer: BC Managed Care – PPO | Admitting: Nurse Practitioner

## 2023-03-27 ENCOUNTER — Encounter: Payer: Self-pay | Admitting: Nurse Practitioner

## 2023-03-27 VITALS — BP 117/74 | HR 76 | Ht 61.0 in | Wt 191.8 lb

## 2023-03-27 DIAGNOSIS — E1165 Type 2 diabetes mellitus with hyperglycemia: Secondary | ICD-10-CM | POA: Diagnosis not present

## 2023-03-27 DIAGNOSIS — Z794 Long term (current) use of insulin: Secondary | ICD-10-CM | POA: Diagnosis not present

## 2023-03-27 MED ORDER — DEXCOM G7 RECEIVER DEVI: 1.0000 | Freq: Once | 0 refills | Status: AC

## 2023-03-27 NOTE — Progress Notes (Signed)
Endocrinology Follow Up Note       03/27/2023, 11:44 AM   Subjective:    Patient ID: Suzanne Lawrence, female    DOB: 1970-12-05.  Suzanne Lawrence is being seen in follow up after being seen in consultation for management of currently uncontrolled symptomatic diabetes requested by  Donetta Potts, MD.   Past Medical History:  Diagnosis Date   Abnormal Pap smear of cervix    Anemia    issue with red blood cells being small   Anxiety    Arthritis    Carpal tunnel syndrome    right   Depression    Diabetes mellitus without complication (HCC)    DVT (deep venous thrombosis) (HCC) 03/2021   left arm   GERD (gastroesophageal reflux disease)    High blood pressure    History of hiatal hernia    Left breast mass    Pre-diabetes     Past Surgical History:  Procedure Laterality Date   BIOPSY  02/05/2022   Procedure: BIOPSY;  Surgeon: Lanelle Bal, DO;  [facility]: [facility];  Service: Endoscopy;;   COLONOSCOPY  2022   Megan Straughan: 7 polyps removed, has to return in one year   COLONOSCOPY  09/2021   Megan Straughan: 12 polyps removed   ESOPHAGOGASTRODUODENOSCOPY (EGD) WITH PROPOFOL N/A 02/05/2022   Procedure: ESOPHAGOGASTRODUODENOSCOPY (EGD) WITH PROPOFOL;  Surgeon: Lanelle Bal, DO;  Location: [facility];  Service: Endoscopy;  Laterality: N/A;  2:00pm   KNEE ARTHROSCOPY Right 10/28/2022   Procedure: RIGHT KNEE ARTHROSCOPY, DEBRIDEMENT;  Surgeon: Cammy Copa, MD;  Location: [facility];  Service: Orthopedics;  Laterality: Right;   left shoulder arthroscopy  12/2020   SHOULDER ARTHROSCOPY WITH OPEN ROTATOR CUFF REPAIR AND DISTAL CLAVICLE ACROMINECTOMY Left 03/21/2022   Procedure: LEFT SHOULDER ARTHOSCOPY, DEBRIDEMENT,  MANIPULATION, ROTATOR CUFF REPAIR, ARTHROSCOPIC DISTAL CLAVICLE EXCISION;  Surgeon: Cammy Copa, MD;  Location: [facility];  Service: Orthopedics;   Laterality: Left;   TUBAL LIGATION      Social History   Socioeconomic History   Marital status: Single    Spouse name: Not on file   Number of children: 3   Years of education: Not on file   Highest education level: Some college, no degree  Occupational History   Not on file  Tobacco Use   Smoking status: Every Day    Current packs/day: 0.50    Types: Cigarettes   Smokeless tobacco: Never   Tobacco comments:    Working on cutting down on smoking. Down to 8 cigarrettes a day now.  Vaping Use   Vaping status: Never Used  Substance and Sexual Activity   Alcohol use: Not Currently   Drug use: Not Currently   Sexual activity: Yes    Birth control/protection: None  Other Topics Concern   Not on file  Social History Narrative   Lives at home with 2 daughters   Right handed   Caffeine: about 2-3 per day    Social Determinants of Health   Financial Resource Strain: Low Risk  (10/25/2021)   Received from [facility], [facility]   Overall Financial Resource Strain (CARDIA)  Difficulty of Paying Living Expenses: Not hard at all  Food Insecurity: No Food Insecurity (10/25/2021)   Received from [facility], [facility]   Hunger Vital Sign    Worried About Running Out of Food in the Last Year: Never true    Ran Out of Food in the Last Year: Never true  Transportation Needs: No Transportation Needs (10/25/2021)   Received from [facility], [facility]   [facility] - Transportation    Lack of Transportation (Medical): No    Lack of Transportation (Non-Medical): No  Physical Activity: Inactive (02/05/2023)   Received from [facility], [facility]   Exercise Vital Sign    Days of Exercise per Week: 0 days    Minutes of Exercise per Session: 0 min  Stress: Stress Concern Present (02/05/2023)   Received from [facility], [facility] - Occupational Stress Questionnaire    Feeling of Stress : To some  extent  Social Connections: Moderately Isolated (10/25/2021)   Received from [facility], [facility]   Social Connection and Isolation Panel [NHANES]    Frequency of Communication with Friends and Family: More than three times a week    Frequency of Social Gatherings with Friends and Family: Never    Attends Religious Services: More than 4 times per year    Active Member of [employer] or Organizations: No    Attends Engineer, structural: Never    Marital Status: Never married    Family History  Problem Relation Age of Onset   Lung cancer Mother    Other Father        multiple issues, doesn't know history for sure   Prostate cancer Father    Stroke Father    Aneurysm Paternal Grandmother    Breast cancer Maternal Aunt    Prostate cancer Maternal Uncle    Alcoholism Maternal Uncle    Aneurysm Cousin        paternal side   Aneurysm Cousin        paternal side    Breast cancer Cousin        maternal side    High blood pressure Other        father's side    Cerebral aneurysm Other        cerebral, on father's side    Alcoholism Other        maternal side    Colon cancer Neg Hx     Outpatient Encounter Medications as of 03/27/2023  Medication Sig   ACCU-CHEK GUIDE test strip 3 (three) times daily.   Accu-Chek Softclix Lancets lancets 3 (three) times daily.   acetaminophen (TYLENOL) 325 MG tablet Take 650 mg by mouth every 6 (six) hours as needed for moderate pain.   ARIPiprazole (ABILIFY) 5 MG tablet Take 5 mg by mouth daily.   atenolol (TENORMIN) 50 MG tablet Take 50 mg by mouth daily.   baclofen (LIORESAL) 10 MG tablet Take 10 mg by mouth 3 (three) times daily as needed (Back pain).   Blood Glucose Monitoring Suppl (ACCU-CHEK GUIDE ME) w/Device KIT See admin instructions.   chlorthalidone (HYGROTON) 25 MG tablet Take 25 mg by mouth every morning.   Continuous Glucose Receiver (DEXCOM G7 RECEIVER) DEVI 1 Device by Does not apply route once for 1 dose.   DULoxetine  (CYMBALTA) 60 MG capsule Take 60 mg by mouth daily in the afternoon.   famotidine (  PEPCID) 40 MG tablet Take 1 tablet by mouth once daily   gabapentin (NEURONTIN) 300 MG capsule Take 300 mg by mouth at bedtime.   insulin lispro (HUMALOG) 100 UNIT/ML injection Inject 6-12 Units into the skin 3 (three) times daily with meals.   lisinopril (ZESTRIL) 40 MG tablet Take 40 mg by mouth daily.   meclizine (ANTIVERT) 25 MG tablet Take 25 mg by mouth 3 (three) times daily as needed for dizziness.   pantoprazole (PROTONIX) 40 MG tablet Take 1 tablet (40 mg total) by mouth 2 (two) times daily before a meal.   SEMGLEE, YFGN, 100 UNIT/ML Pen SMARTSIG:40 Unit(s) SUB-Q Daily   spironolactone (ALDACTONE) 25 MG tablet Take 12.5 mg by mouth daily.   sucralfate (CARAFATE) 1 g tablet Take 1 g by mouth daily as needed (Acid reflux).   traZODone (DESYREL) 50 MG tablet Take 50-100 mg by mouth at bedtime.   Continuous Glucose Sensor (DEXCOM G7 SENSOR) MISC Use to continuously monitor glucose. Change sensor every 10 days. (Patient not taking: Reported on 03/27/2023)   oxyCODONE (ROXICODONE) 5 MG immediate release tablet Take 1 tablet (5 mg total) by mouth every 4 (four) hours as needed for severe pain. (Patient not taking: Reported on 03/27/2023)   [DISCONTINUED] Continuous Glucose Receiver (FREESTYLE LIBRE 3 READER) DEVI 1 each by Does not apply route as directed. (Patient not taking: Reported on 03/27/2023)   [DISCONTINUED] insulin glargine (LANTUS) 100 UNIT/ML injection Inject 35 Units into the skin daily. (Patient not taking: Reported on 03/27/2023)   No facility-administered encounter medications on file as of 03/27/2023.    ALLERGIES: Allergies  Allergen Reactions   Ferumoxytol Other (See Comments)    Hot flash, nausea, chest pressure    Hydrochlorothiazide Other (See Comments)     "urinary frequency"; was going every 20 minutes and was causing problems at work    VACCINATION STATUS: Immunization History   Administered Date(s) Administered   [employer] Vaccination 07/06/2020    Diabetes She presents for her follow-up diabetic visit. She has type 2 diabetes mellitus. Onset time: diagnosed at approx age of 60 (in June) Her disease course has been stable. There are no hypoglycemic associated symptoms. Associated symptoms include fatigue. Pertinent negatives for diabetes include no blurred vision, no polydipsia, no polyuria, no visual change and no weight loss. There are no hypoglycemic complications. Symptoms are stable. There are no diabetic complications. Risk factors for coronary artery disease include diabetes mellitus, dyslipidemia, family history, obesity, hypertension, sedentary lifestyle and tobacco exposure. Current diabetic treatment includes intensive insulin program. She is compliant with treatment most of the time. Her weight is fluctuating minimally. She is following a generally healthy diet. When asked about meal planning, she reported none. She has had a previous visit with a dietitian. She rarely participates in exercise. Her home blood glucose trend is fluctuating minimally. Her breakfast blood glucose range is generally 180-200 mg/dl. Her lunch blood glucose range is generally 180-200 mg/dl. Her dinner blood glucose range is generally 180-200 mg/dl. Her bedtime blood glucose range is generally 180-200 mg/dl. (She presents today with her logs, no meter, showing improved glycemic profile overall.  Her most recent A1c on 02/28/23 was 8.8%, increasing from last visit of 7.4%.  PAP denied her request for financial assistance with her insulin but her Medicaid finally kicked in to help. She denies any hypoglycemia.) An ACE inhibitor/angiotensin II receptor blocker is being taken. She sees a podiatrist.Eye exam is not current.     Review of systems  Constitutional: +  stable body weight, current Body mass index is 36.24 kg/m., no fatigue, no subjective hyperthermia, no subjective  hypothermia Eyes: + blurry vision-improving, no xerophthalmia ENT: no sore throat, no nodules palpated in throat, no dysphagia/odynophagia, no hoarseness Cardiovascular: no chest pain, no shortness of breath, no palpitations, no leg swelling Respiratory: no cough, no shortness of breath Gastrointestinal: no nausea/vomiting/diarrhea Musculoskeletal: no muscle/joint aches Skin: no rashes, no hyperemia Neurological: no tremors, no numbness, no tingling, no dizziness Psychiatric: no depression, no anxiety  Objective:     BP 117/74 (BP Location: Right Arm, Patient Position: Sitting, Cuff Size: Large)   Pulse 76   Ht 5\' 1"  (1.549 m)   Wt 191 lb 12.8 oz (87 kg)   LMP 05/01/2021 (Approximate)   BMI 36.24 kg/m   Wt Readings from Last 3 Encounters:  03/27/23 191 lb 12.8 oz (87 kg)  03/25/23 191 lb (86.6 kg)  03/17/23 190 lb 14.7 oz (86.6 kg)     BP Readings from Last 3 Encounters:  03/27/23 117/74  03/25/23 132/82  03/17/23 (!) 147/81     Physical Exam- Limited  Constitutional:  Body mass index is 36.24 kg/m. , not in acute distress, normal state of mind Eyes:  EOMI, no exophthalmos Musculoskeletal: no gross deformities, strength intact in all four extremities, no gross restriction of joint movements Skin:  no rashes, no hyperemia Neurological: no tremor with outstretched hands    CMP ( most recent) CMP     Component Value Date/Time   NA 136 10/22/2022 1233   K 3.8 10/22/2022 1233   CL 97 (L) 10/22/2022 1233   CO2 29 10/22/2022 1233   GLUCOSE 99 10/22/2022 1233   BUN 9 10/22/2022 1233   BUN 6 08/06/2022 0000   CREATININE 0.79 10/22/2022 1233   CALCIUM 9.2 10/22/2022 1233   ALBUMIN 5.3 (A) 08/02/2022 0000   AST 67 (A) 08/06/2022 0000   ALT 63 (A) 08/06/2022 0000   ALKPHOS 125 08/02/2022 0000   GFRNONAA >60 10/22/2022 1233     Diabetic Labs (most recent): Lab Results  Component Value Date   HGBA1C 7.4 (A) 12/25/2022   HGBA1C 12.6 (A) 09/02/2022   HGBA1C 7.6  06/03/2022   MICROALBUR 10 mg/L 09/30/2022     Lipid Panel ( most recent) Lipid Panel  No results found for: "CHOL", "TRIG", "HDL", "CHOLHDL", "VLDL", "LDLCALC", "LDLDIRECT", "LABVLDL"    Lab Results  Component Value Date   TSH 3.600 03/17/2023   FREET4 0.93 03/17/2023         Assessment & Plan:   1) Type 2 diabetes mellitus with hyperglycemia, with long-term current use of insulin (HCC)  She presents today with her logs, no meter, showing improved glycemic profile overall.  Her most recent A1c on 02/28/23 was 8.8%, increasing from last visit of 7.4%.  PAP denied her request for financial assistance with her insulin but her Medicaid finally kicked in to help. She denies any hypoglycemia.  - Dorthea Alana has currently uncontrolled symptomatic type 2 DM since 52 years of age.   -Recent labs reviewed.  - I had a long discussion with her about the progressive nature of diabetes and the pathology behind its complications. -her diabetes is not currently complicated but she remains at a high risk for more acute and chronic complications which include CAD, CVA, CKD, retinopathy, and neuropathy. These are all discussed in detail with her.  The following Lifestyle Medicine recommendations according to American College of Lifestyle Medicine [facility]) were discussed and offered to patient  and she agrees to start the journey:  A. Whole Foods, Plant-based plate comprising of fruits and vegetables, plant-based proteins, whole-grain carbohydrates was discussed in detail with the patient.   A list for source of those nutrients were also provided to the patient.  Patient will use only water or unsweetened tea for hydration. B.  The need to stay away from risky substances including alcohol, smoking; obtaining 7 to 9 hours of restorative sleep, at least 150 minutes of moderate intensity exercise weekly, the importance of healthy social connections,  and stress reduction techniques were discussed. C.  A  full color page of  Calorie density of various food groups per pound showing examples of each food groups was provided to the patient.  - Nutritional counseling repeated at each appointment due to patients tendency to fall back in to old habits.  - The patient admits there is a room for improvement in their diet and drink choices. -  Suggestion is made for the patient to avoid simple carbohydrates from their diet including Cakes, Sweet Desserts / Pastries, Ice Cream, Soda (diet and regular), Sweet Tea, Candies, Chips, Cookies, Sweet Pastries, Store Bought Juices, Alcohol in Excess of 1-2 drinks a day, Artificial Sweeteners, Coffee Creamer, and "Sugar-free" Products. This will help patient to have stable blood glucose profile and potentially avoid unintended weight gain.   - I encouraged the patient to switch to unprocessed or minimally processed complex starch and increased protein intake (animal or plant source), fruits, and vegetables.   - Patient is advised to stick to a routine mealtimes to eat 3 meals a day and avoid unnecessary snacks (to snack only to correct hypoglycemia).  - she has been seeing Norm Salt, RDN, CDE for diabetes education.  - I have approached her with the following individualized plan to manage her diabetes and patient agrees:   She is advised to continue her Lantus 40 units SQ nightly and increase Humalog to 10-16 units TID with meals if glucose is above 90 and she is eating (Specific instructions on how to titrate insulin dosage based on glucose readings given to patient in writing).    -she is encouraged to continue using CGM to monitor glucose 4 times daily, before meals and before bed, and to call the clinic if she has readings less than 70 or above 300 for 3 tests in a row.  Insurance would not cover [place], they do cover Dexcom but she needs receiver as her phone is not compatible.  I sent this in for her today.  - she is warned not to take insulin without  proper monitoring per orders. - Adjustment parameters are given to her for hypo and hyperglycemia in writing.  - she will be considered for incretin therapy as appropriate next visit.  - Specific targets for  A1c; LDL, HDL, and Triglycerides were discussed with the patient.  2) Blood Pressure /Hypertension:  her blood pressure is controlled to target.   she is advised to continue her current medications including Lisinopril 40 mg p.o. daily with breakfast, and Atenolol 50 mg po daily.  3) Lipids/Hyperlipidemia:    There is no recent lipid panel available to review, nor is she on any lipid lowering medications.  Will check lipid panel on subsequent visits.  4)  Weight/Diet:  her Body mass index is 36.24 kg/m.  -  clearly complicating her diabetes care.   she is a candidate for weight loss. I discussed with her the fact that loss of 5 - 10%  of her  current body weight will have the most impact on her diabetes management.  Exercise, and detailed carbohydrates information provided  -  detailed on discharge instructions.  5) Chronic Care/Health Maintenance: -she is on ACEI/ARB and not on Statin medications and is encouraged to initiate and continue to follow up with Ophthalmology, Dentist, Podiatrist at least yearly or according to recommendations, and advised to QUIT SMOKING. I have recommended yearly flu vaccine and pneumonia vaccine at least every 5 years; moderate intensity exercise for up to 150 minutes weekly; and sleep for at least 7 hours a day.  - she is advised to maintain close follow up with Donetta Potts, MD for primary care needs, as well as her other providers for optimal and coordinated care.      I spent  40  minutes in the care of the patient today including review of labs from CMP, Lipids, Thyroid Function, Hematology (current and previous including abstractions from other facilities); face-to-face time discussing  her blood glucose readings/logs, discussing hypoglycemia  and hyperglycemia episodes and symptoms, medications doses, her options of short and long term treatment based on the latest standards of care / guidelines;  discussion about incorporating lifestyle medicine;  and documenting the encounter. Risk reduction counseling performed per USPSTF guidelines to reduce obesity and cardiovascular risk factors.     Please refer to Patient Instructions for Blood Glucose Monitoring and Insulin/Medications Dosing Guide"  in media tab for additional information. Please  also refer to " Patient Self Inventory" in the Media  tab for reviewed elements of pertinent patient history.  [name] participated in the discussions, expressed understanding, and voiced agreement with the above plans.  All questions were answered to her satisfaction. she is encouraged to contact clinic should she have any questions or concerns prior to her return visit.     Follow up plan: - Return in about 3 months (around 06/27/2023) for Diabetes F/U with A1c in office, No previsit labs, Bring meter and logs.   Ronny Bacon, [facility] [address] [place], [place] [address] Phone: [phone] Fax: [phone]  03/27/2023, 11:44 AM

## 2023-04-02 ENCOUNTER — Ambulatory Visit (HOSPITAL_COMMUNITY)
Admission: RE | Admit: 2023-04-02 | Discharge: 2023-04-02 | Disposition: A | Discharge status: Home / Self Care | Payer: BC Managed Care – PPO | Source: Ambulatory Visit | Attending: Internal Medicine | Admitting: Internal Medicine

## 2023-04-02 ENCOUNTER — Encounter (HOSPITAL_COMMUNITY)
Admission: RE | Admit: 2023-04-02 | Discharge: 2023-04-02 | Disposition: A | Discharge status: Home / Self Care | Payer: BC Managed Care – PPO | Source: Ambulatory Visit | Attending: Internal Medicine | Admitting: Internal Medicine

## 2023-04-02 DIAGNOSIS — R0609 Other forms of dyspnea: Secondary | ICD-10-CM | POA: Diagnosis present

## 2023-04-02 LAB — NM MYOCAR MULTI W/SPECT W/WALL MOTION / EF
LV dias vol: 82 mL (ref 46–106)
LV sys vol: 41 mL
Nuc Stress EF: 51 %
Peak HR: 111 {beats}/min
RATE: 0.6
Rest HR: 63 {beats}/min
Rest Nuclear Isotope Dose: 10 mCi
SDS: 0
SRS: 0
SSS: 0
ST Depression (mm): 0 mm
Stress Nuclear Isotope Dose: 29.5 mCi
TID: 1.07

## 2023-04-02 MED ORDER — SODIUM CHLORIDE FLUSH 0.9 % IV SOLN
INTRAVENOUS | Status: AC
  Administered 2023-04-02: 10 mL via INTRAVENOUS
  Filled 2023-04-02: qty 10

## 2023-04-02 MED ORDER — TECHNETIUM TC 99M TETROFOSMIN IV KIT
30.0000 | PACK | Freq: Once | INTRAVENOUS | Status: AC | PRN
  Administered 2023-04-02: 29.5 via INTRAVENOUS

## 2023-04-02 MED ORDER — REGADENOSON 0.4 MG/5ML IV SOLN
INTRAVENOUS | Status: AC
  Administered 2023-04-02: 0.4 mg via INTRAVENOUS
  Filled 2023-04-02: qty 5

## 2023-04-02 MED ORDER — TECHNETIUM TC 99M TETROFOSMIN IV KIT
10.0000 | PACK | Freq: Once | INTRAVENOUS | Status: AC | PRN
  Administered 2023-04-02: 10 via INTRAVENOUS

## 2023-04-03 ENCOUNTER — Other Ambulatory Visit: Payer: BC Managed Care – PPO

## 2023-04-07 ENCOUNTER — Ambulatory Visit (INDEPENDENT_AMBULATORY_CARE_PROVIDER_SITE_OTHER): Payer: BC Managed Care – PPO | Admitting: Orthopedic Surgery

## 2023-04-07 ENCOUNTER — Encounter: Payer: Self-pay | Admitting: Orthopedic Surgery

## 2023-04-07 DIAGNOSIS — Z9889 Other specified postprocedural states: Secondary | ICD-10-CM | POA: Diagnosis not present

## 2023-04-07 NOTE — Progress Notes (Unsigned)
Office Visit Note   Patient: Suzanne Lawrence           Date of Birth: 1970-09-01           MRN: [id number] Visit Date: 04/07/2023 Requested by: Donetta Potts, MD [address] [place],  [place] [address] PCP: Donetta Potts, MD  Subjective: Chief Complaint  Patient presents with   Left Shoulder - Follow-up    HPI: Suzanne Lawrence is a 52 y.o. female who presents to the office reporting continued left shoulder pain and stiffness.  Underwent shoulder arthroscopy with rotator cuff repair and distal clavicle excision about a year ago.  Had glenohumeral joint injection 03/10/2023 which gave her relief for a few days but now the pain is returned.  She does have diabetes..                ROS: All systems reviewed are negative as they relate to the chief complaint within the history of present illness.  Patient denies fevers or chills.  Assessment & Plan: Visit Diagnoses:  1. Status post rotator cuff repair     Plan: Impression is left shoulder pain with pretty reasonable rotator cuff strength and no coarseness or grinding.  I think her persistent pain may be residual adhesive capsulitis.  Does have a little bit of limitation of passive external rotation.  Plan at this time is MRI arthrogram of the left shoulder to evaluate the shoulder following rotator cuff tear repair.  She may have some persistent adhesive capsulitis which she may have to get over with continued stretching exercises.  Follow-up after that study.  Follow-Up Instructions: No follow-ups on file.   Orders:  Orders Placed This Encounter  Procedures   MR Shoulder Left w/ contrast   Arthrogram   No orders of the defined types were placed in this encounter.     Procedures: No procedures performed   Clinical Data: No additional findings.  Objective: Vital Signs: LMP 05/01/2021 (Approximate)   Physical Exam:  Constitutional: Patient appears well-developed HEENT:  Head: Normocephalic Eyes:EOM are  normal Neck: Normal range of motion Cardiovascular: Normal rate Pulmonary/chest: Effort normal Neurologic: Patient is alert Skin: Skin is warm Psychiatric: Patient has normal mood and affect  Ortho Exam: Ortho exam demonstrates range of motion on the right of 70/110/175 passive range of motion on the left is 50/95/165.  No coarseness with internal or external rotation of that left shoulder at 90 degrees of abduction or 15 degrees of abduction.  No discrete AC joint tenderness over the [facility] joint.  Cervical spine range of motion intact.  Deltoid is functional.  Specialty Comments:  No specialty comments available.  Imaging: No results found.   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 01/25/2020   High blood pressure    Depression    Carpal tunnel syndrome    Anxiety    Past Medical History:  Diagnosis Date   Abnormal Pap smear of cervix  Anemia    issue with red blood cells being small   Anxiety    Arthritis    Carpal tunnel syndrome    right   Depression    Diabetes mellitus without complication (HCC)    DVT (deep venous thrombosis) (HCC) 03/2021   left arm   GERD (gastroesophageal reflux disease)    High blood pressure    History of hiatal hernia    Left breast mass    Pre-diabetes     Family History  Problem Relation Age of Onset   Lung cancer Mother    Other Father        multiple issues, doesn't know history for sure   Prostate cancer  Father    Stroke Father    Aneurysm Paternal Grandmother    Breast cancer Maternal Aunt    Prostate cancer Maternal Uncle    Alcoholism Maternal Uncle    Aneurysm Cousin        paternal side   Aneurysm Cousin        paternal side    Breast cancer Cousin        maternal side    High blood pressure Other        father's side    Cerebral aneurysm Other        cerebral, on father's side    Alcoholism Other        maternal side    Colon cancer Neg Hx     Past Surgical History:  Procedure Laterality Date   BIOPSY  02/05/2022   Procedure: BIOPSY;  Surgeon: Lanelle Bal, DO;  [facility];  Service: Endoscopy;;   COLONOSCOPY  2022   Megan Straughan: 7 polyps removed, has to return in one year   COLONOSCOPY  09/2021   Megan Straughan: 12 polyps removed   ESOPHAGOGASTRODUODENOSCOPY (EGD) WITH PROPOFOL N/A 02/05/2022   Procedure: ESOPHAGOGASTRODUODENOSCOPY (EGD) WITH PROPOFOL;  Surgeon: Lanelle Bal, DO;  [facility];  Service: Endoscopy;  Laterality: N/A;  2:00pm   KNEE ARTHROSCOPY Right 10/28/2022   Procedure: RIGHT KNEE ARTHROSCOPY, DEBRIDEMENT;  Surgeon: Cammy Copa, MD;  Location: [facility];  Service: Orthopedics;  Laterality: Right;   left shoulder arthroscopy  12/2020   SHOULDER ARTHROSCOPY WITH OPEN ROTATOR CUFF REPAIR AND DISTAL CLAVICLE ACROMINECTOMY Left 03/21/2022   Procedure: LEFT SHOULDER ARTHOSCOPY, DEBRIDEMENT,  MANIPULATION, ROTATOR CUFF REPAIR, ARTHROSCOPIC DISTAL CLAVICLE EXCISION;  Surgeon: Cammy Copa, MD;  Location: [facility];  Service: Orthopedics;  Laterality: Left;   TUBAL LIGATION     Social History   Occupational History   Not on file  Tobacco Use   Smoking status: Every Day    Current packs/day: 0.50    Types: Cigarettes   Smokeless tobacco: Never   Tobacco comments:    Working on cutting down on smoking. Down to 8 cigarrettes a day now.  Vaping Use   Vaping status: Never Used  Substance and  Sexual Activity   Alcohol use: Not Currently   Drug use: Not Currently   Sexual activity: Yes    Birth control/protection: None

## 2023-04-09 ENCOUNTER — Telehealth: Payer: Self-pay

## 2023-04-09 NOTE — Telephone Encounter (Signed)
-----   Message from Burnard Bunting sent at 04/08/2023  4:12 PM EDT ----- Cresenciano Lick can you have Victorino Dike follow-up with [facility] after MRI scan.  Thanks

## 2023-04-09 NOTE — Telephone Encounter (Signed)
Holding as reminder to make sure patient gets appt to review MRI

## 2023-04-15 ENCOUNTER — Encounter (HOSPITAL_COMMUNITY): Payer: Self-pay | Admitting: Hematology

## 2023-04-22 ENCOUNTER — Ambulatory Visit: Payer: Medicaid Other | Attending: Internal Medicine

## 2023-04-22 DIAGNOSIS — R0609 Other forms of dyspnea: Secondary | ICD-10-CM | POA: Insufficient documentation

## 2023-04-22 LAB — ECHOCARDIOGRAM COMPLETE
AR max vel: 2.74 cm2
AV Area VTI: 2.59 cm2
AV Area mean vel: 2.63 cm2
AV Mean grad: 5 mmHg
AV Peak grad: 10.1 mmHg
Ao pk vel: 1.59 m/s
Area-P 1/2: 3.17 cm2
Calc EF: 62.8 %
MV VTI: 2.48 cm2
P 1/2 time: 638 msec
S' Lateral: 2.5 cm
Single Plane A2C EF: 63.7 %
Single Plane A4C EF: 65.8 %

## 2023-04-24 ENCOUNTER — Telehealth: Payer: Self-pay

## 2023-04-24 MED ORDER — FUROSEMIDE 20 MG PO TABS: 20.0000 mg | ORAL_TABLET | Freq: Every day | ORAL | 2 refills | Status: AC | PRN

## 2023-04-24 NOTE — Telephone Encounter (Signed)
 Patient informed and verbalized understanding. Sent copy to PCP

## 2023-04-24 NOTE — Telephone Encounter (Signed)
-----   Message from Vishnu P Mallipeddi sent at 04/23/2023 12:21 PM EDT ----- Normal pumping function of the heart, G1 DD (mild stiff heart) and mild aortic regurgitation (leakiness across aortic valve).  Start p.o. Lasix 20 mg as needed for SOB.  Schedule follow-up in 1 year.

## 2023-05-02 ENCOUNTER — Other Ambulatory Visit: Payer: Self-pay | Admitting: Medical Genetics

## 2023-05-02 DIAGNOSIS — Z006 Encounter for examination for normal comparison and control in clinical research program: Secondary | ICD-10-CM

## 2023-05-05 ENCOUNTER — Other Ambulatory Visit: Payer: Self-pay | Admitting: *Deleted

## 2023-05-05 DIAGNOSIS — E1165 Type 2 diabetes mellitus with hyperglycemia: Secondary | ICD-10-CM

## 2023-05-05 MED ORDER — DEXCOM G7 SENSOR MISC: 2 refills | Status: DC

## 2023-05-05 NOTE — Telephone Encounter (Signed)
Patient left a voicemail stating that she had never got her Dexcom G7. She thought that it had been done last week. I called to be sure that the patient only needed the sensors, she shared that she had a Dexcom and only needed the sensors. A prescription was sent to the [facility] in [place]. Patient is aware.

## 2023-05-13 ENCOUNTER — Telehealth: Payer: Self-pay | Admitting: Orthopedic Surgery

## 2023-05-13 NOTE — Telephone Encounter (Signed)
Patient has submitted STD forms and states she hasn't worked for 2 years. She is requesting oow from when seen 03/10/23 through next appointment on 10/14. Please advise, provide note if okay. Thank you!

## 2023-05-14 ENCOUNTER — Encounter (HOSPITAL_BASED_OUTPATIENT_CLINIC_OR_DEPARTMENT_OTHER): Payer: Self-pay

## 2023-05-14 NOTE — Telephone Encounter (Signed)
Noted for [employer].

## 2023-05-14 NOTE — Telephone Encounter (Signed)
Ok thx.

## 2023-05-14 NOTE — Telephone Encounter (Signed)
Note completed 

## 2023-05-19 ENCOUNTER — Encounter: Payer: Self-pay | Admitting: Gastroenterology

## 2023-05-19 ENCOUNTER — Ambulatory Visit (INDEPENDENT_AMBULATORY_CARE_PROVIDER_SITE_OTHER): Payer: Medicaid Other | Admitting: Gastroenterology

## 2023-05-19 VITALS — BP 126/76 | HR 69 | Temp 97.6°F | Ht 61.0 in | Wt 187.6 lb

## 2023-05-19 DIAGNOSIS — R197 Diarrhea, unspecified: Secondary | ICD-10-CM

## 2023-05-19 DIAGNOSIS — K219 Gastro-esophageal reflux disease without esophagitis: Secondary | ICD-10-CM

## 2023-05-19 MED ORDER — PANTOPRAZOLE SODIUM 40 MG PO TBEC: 40.0000 mg | DELAYED_RELEASE_TABLET | Freq: Two times a day (BID) | ORAL | 2 refills | Status: DC

## 2023-05-19 NOTE — Patient Instructions (Signed)
Pantoprazole 40 mg twice daily.  You can try weaning your famotidine if you would like.  Reduce to taking it every other day for 2 weeks and then stop medication altogether and monitor for worsening reflux symptoms, nausea, or upper abdominal pain.  If he began to experience any worsening of the symptoms you can resume your famotidine 40 mg nightly  Follow a GERD diet:  Avoid fried, fatty, greasy, spicy, citrus foods. Avoid caffeine and carbonated beverages. Avoid chocolate. Try eating 4-6 small meals a day rather than 3 large meals. Do not eat within 3 hours of laying down. Prop head of bed up on wood or bricks to create a 6 inch incline.  Will plan to follow-up in 6 months, sooner if needed.  It was great to see you again today!  I want to create trusting relationships with patients. If you receive a survey regarding your visit,  I greatly appreciate you taking time to fill this out on paper or through your MyChart. I value your feedback.  Brooke Bonito, MSN, FNP-BC, AGACNP-BC [facility]

## 2023-05-19 NOTE — Progress Notes (Signed)
GI Office Note    Referring Provider: Donetta Potts, MD Primary Care Physician:  Donetta Potts, MD Primary Gastroenterologist: Hennie Duos. Marletta Lor, DO  Date:  05/19/2023  ID:  Suzanne Lawrence, DOB May 05, 1971, MRN [id number]  Chief Complaint   Chief Complaint  Patient presents with   Follow-up    Follow up. No problems    History of Present Illness  Suzanne Lawrence is a 52 y.o. female with a history of diabetes, GERD, DVT, HTN, anxiety, arthritis, depression, and GERD presenting today for follow-up of diarrhea.  Colonoscopy January 2022 (Dr. Arlyn Dunning) - One 13 mm polyp in the distal transverse colon - One 4 mm polyp in the descending colon - One 6 mm polyp at 50 cm proximal to the anus - One 9 mm polyp at 40 cm proximal to the anus - One 7 mm polyp at 25 cm proximal to the anus - Two 5 mm polyps at 24 cm proximal to the anus - Repeat colonoscopy in 1 year for surveillance based on pathology results.                                                                   Colonoscopy February 2023 (Dr. Rozell Searing): - Two 3 to 4 mm polyps in the sigmoid colon and at 20  cm proximal to the anus - Two 3 to 4 mm polyps in the descending colon,  - One 4 mm polyp in the descending colon - Four 3 to 4 mm polyps in the sigmoid colon, - Three 2 to 4 mm polyps at the recto-sigmoid colon -Path: hyperplastic polyps -repeat in 3 years.   Patient placed on recall for TCS in February 2026.    Repeat labs in March 2023 with ferritin 5.6, iron 20, hemoglobin 8.4.  Underwent endometrial ablation in March 2023.  Received 2 iron infusions after following with [facility].   EGD 02/05/22: -normal esophagus -small hiatal hernia -gastritis s/po biopsy -3 gastric polyps removed s/p biopsy.  -Continue dexilant 60 mg daily.    RUQ [place] 08/09/22:  -The echogenicity of the liver is increased. This is a nonspecific finding but is most commonly seen with fatty infiltration of the  liver.  -There are a few non-mass-like areas of decreased attenuation  including adjacent to the gallbladder fossa, which are favored to  represent areas of focal fatty sparing. No discrete hepatic mass  lesion identified.  -Hepatomegaly   Office visit 09/10/22.  Patient was taking omeprazole once daily for GERD, reported rare breakthrough symptoms and takes super fate as needed.   denied any weight loss or lack of appetite.  Did note some feelings with fatigue after ablation but also was having vision troubles which suspected to be related to her diabetes as well as her hypertension.  She had reported some diarrhea couple months prior with stool stately polyp/capsules and was having 6-7/day but was resolved at time of office visit.  Having bowel movement daily, sometimes soft and sometimes solid.  Follow regularly with endocrinology.  BP high that day, 190s over 90s.   Labs 03/11/2023: Hemoglobin 15.2, MCV 82.3, platelets 307, iron 147, saturation 36%, ferritin 64  Last office visit 03/17/23. Carafate 3 times per day with omeprazole once daily.  Recently started having diarrhea with about 5 pellets per day and occurring postprandially with mild abdominal cramping.  Unable to identify certain food triggers except for salad.  Denies any melena or BRBPR.  Denies any lack of appetite or weight loss.  No abdominal pain.Start pantoprazole 40 mg BID and stop omeprazole.  Reduce Carafate to as needed.  Check fecal elastase, TSH, and celiac serologies.  Recommended Imodium 1 mg 1-2 times daily.  Low-fat diet.   Labs 03/17/2023: Pancreatic elastase normal.  TSH normal.  Celiac serologies negative.  Today: GERD - No longer taking Carafate. Last dose was last month and doing well. Taking pantoprazole twice daily. No N/V, abdominal pain. Appetite is okay. Never been a breakfast person. Does not eat anything until about 11 am. No dysphagia. Taking pepcid at night before bed.   Diarrhea - Diarrhea has went away. If she  eats something greasy she has diarrhea but otherwise well controlled. Denies melena or brbpr.   Goes next week to recheck her A1c. Still taking her vitamin D  Current Outpatient Medications  Medication Sig Dispense Refill   ACCU-CHEK GUIDE test strip 3 (three) times daily.     Accu-Chek Softclix Lancets lancets 3 (three) times daily.     acetaminophen (TYLENOL) 325 MG tablet Take 650 mg by mouth every 6 (six) hours as needed for moderate pain.     ARIPiprazole (ABILIFY) 5 MG tablet Take 5 mg by mouth daily.     baclofen (LIORESAL) 10 MG tablet Take 10 mg by mouth 3 (three) times daily as needed (Back pain).     Blood Glucose Monitoring Suppl (ACCU-CHEK GUIDE ME) w/Device KIT See admin instructions.     chlorthalidone (HYGROTON) 25 MG tablet Take 25 mg by mouth every morning.     Continuous Glucose Sensor (DEXCOM G7 SENSOR) MISC Use to continuously monitor glucose. Change sensor every 10 days. 3 each 2   DULoxetine (CYMBALTA) 60 MG capsule Take 60 mg by mouth daily in the afternoon.     famotidine (PEPCID) 40 MG tablet Take 1 tablet by mouth once daily 90 tablet 0   furosemide (LASIX) 20 MG tablet Take 1 tablet (20 mg total) by mouth daily as needed. 30 tablet 2   gabapentin (NEURONTIN) 300 MG capsule Take 300 mg by mouth at bedtime.     insulin lispro (HUMALOG) 100 UNIT/ML injection Inject 6-12 Units into the skin 3 (three) times daily with meals.     lisinopril (ZESTRIL) 40 MG tablet Take 40 mg by mouth daily.     meclizine (ANTIVERT) 25 MG tablet Take 25 mg by mouth 3 (three) times daily as needed for dizziness.     OZEMPIC, 0.25 OR 0.5 MG/DOSE, 2 MG/3ML SOPN SMARTSIG:0.25 Milligram(s) SUB-Q Once a Week     pantoprazole (PROTONIX) 40 MG tablet Take 1 tablet (40 mg total) by mouth 2 (two) times daily before a meal. 60 tablet 2   SEMGLEE, YFGN, 100 UNIT/ML Pen SMARTSIG:40 Unit(s) SUB-Q Daily     spironolactone (ALDACTONE) 25 MG tablet Take 12.5 mg by mouth daily.     sucralfate (CARAFATE) 1  g tablet Take 1 g by mouth daily as needed (Acid reflux).     traZODone (DESYREL) 50 MG tablet Take 50-100 mg by mouth at bedtime.     atenolol (TENORMIN) 50 MG tablet Take 50 mg by mouth daily.     oxyCODONE (ROXICODONE) 5 MG immediate release tablet Take 1 tablet (5 mg total) by mouth every 4 (four) hours  as needed for severe pain. (Patient not taking: Reported on 03/27/2023) 30 tablet 0   No current facility-administered medications for this visit.    Past Medical History:  Diagnosis Date   Abnormal Pap smear of cervix    Anemia    issue with red blood cells being small   Anxiety    Arthritis    Carpal tunnel syndrome    right   Depression    Diabetes mellitus without complication (HCC)    DVT (deep venous thrombosis) (HCC) 03/2021   left arm   GERD (gastroesophageal reflux disease)    High blood pressure    History of hiatal hernia    Left breast mass    Pre-diabetes     Past Surgical History:  Procedure Laterality Date   BIOPSY  02/05/2022   Procedure: BIOPSY;  Surgeon: Lanelle Bal, DO;  [facility];  Service: Endoscopy;;   COLONOSCOPY  2022   Megan Straughan: 7 polyps removed, has to return in one year   COLONOSCOPY  09/2021   Megan Straughan: 12 polyps removed   ESOPHAGOGASTRODUODENOSCOPY (EGD) WITH PROPOFOL N/A 02/05/2022   Procedure: ESOPHAGOGASTRODUODENOSCOPY (EGD) WITH PROPOFOL;  Surgeon: Lanelle Bal, DO;  [facility];  Service: Endoscopy;  Laterality: N/A;  2:00pm   KNEE ARTHROSCOPY Right 10/28/2022   Procedure: RIGHT KNEE ARTHROSCOPY, DEBRIDEMENT;  Surgeon: Cammy Copa, MD;  Location: [facility];  Service: Orthopedics;  Laterality: Right;   left shoulder arthroscopy  12/2020   SHOULDER ARTHROSCOPY WITH OPEN ROTATOR CUFF REPAIR AND DISTAL CLAVICLE ACROMINECTOMY Left 03/21/2022   Procedure: LEFT SHOULDER ARTHOSCOPY, DEBRIDEMENT,  MANIPULATION, ROTATOR CUFF REPAIR, ARTHROSCOPIC DISTAL CLAVICLE EXCISION;  Surgeon:  Cammy Copa, MD;  Location: [facility];  Service: Orthopedics;  Laterality: Left;   TUBAL LIGATION      Family History  Problem Relation Age of Onset   Lung cancer Mother    Other Father        multiple issues, doesn't know history for sure   Prostate cancer Father    Stroke Father    Aneurysm Paternal Grandmother    Breast cancer Maternal Aunt    Prostate cancer Maternal Uncle    Alcoholism Maternal Uncle    Aneurysm Cousin        paternal side   Aneurysm Cousin        paternal side    Breast cancer Cousin        maternal side    High blood pressure Other        father's side    Cerebral aneurysm Other        cerebral, on father's side    Alcoholism Other        maternal side    Colon cancer Neg Hx     Allergies as of 05/19/2023 - Review Complete 05/19/2023  Allergen Reaction Noted   Ferumoxytol Other (See Comments) 12/18/2021   Hydrochlorothiazide Other (See Comments) 04/02/2017    Social History   Socioeconomic History   Marital status: Single    Spouse name: Not on file   Number of children: 3   Years of education: Not on file   Highest education level: Some college, no degree  Occupational History   Not on file  Tobacco Use   Smoking status: Every Day    Current packs/day: 0.50    Types: Cigarettes   Smokeless tobacco: Never   Tobacco comments:    Working on cutting down on  smoking. Down to 8 cigarrettes a day now.  Vaping Use   Vaping status: Never Used  Substance and Sexual Activity   Alcohol use: Not Currently   Drug use: Not Currently   Sexual activity: Yes    Birth control/protection: None  Other Topics Concern   Not on file  Social History Narrative   Lives at home with 2 daughters   Right handed   Caffeine: about 2-3 per day    Social Determinants of Health   Financial Resource Strain: Low Risk  (10/25/2021)   Received from [facility], [facility]   Overall Financial Resource Strain (CARDIA)    Difficulty of Paying Living  Expenses: Not hard at all  Food Insecurity: No Food Insecurity (10/25/2021)   Received from [facility], [facility]   Hunger Vital Sign    Worried About Running Out of Food in the Last Year: Never true    Ran Out of Food in the Last Year: Never true  Transportation Needs: No Transportation Needs (10/25/2021)   Received from [facility], [facility]   [facility] - Transportation    Lack of Transportation (Medical): No    Lack of Transportation (Non-Medical): No  Physical Activity: Inactive (02/05/2023)   Received from [facility], [facility]   Exercise Vital Sign    Days of Exercise per Week: 0 days    Minutes of Exercise per Session: 0 min  Stress: Stress Concern Present (02/05/2023)   Received from [facility], [facility] of [facility] - Occupational Stress Questionnaire    Feeling of Stress : To some extent  Social Connections: Moderately Isolated (10/25/2021)   Received from [facility], [facility]   Social Connection and Isolation Panel [NHANES]    Frequency of Communication with Friends and Family: More than three times a week    Frequency of Social Gatherings with Friends and Family: Never    Attends Religious Services: More than 4 times per year    Active Member of [employer] or Organizations: No    Attends Banker Meetings: Never    Marital Status: Never married   Review of Systems   Gen: Denies fever, chills, anorexia. Denies fatigue, weakness, weight loss.  CV: Denies chest pain, palpitations, syncope, peripheral edema, and claudication. Resp: Denies dyspnea at rest, cough, wheezing, coughing up blood, and pleurisy. GI: See HPI Derm: Denies rash, itching, dry skin Psych: Denies depression, anxiety, memory loss, confusion. No homicidal or suicidal ideation.  Heme: Denies bruising, bleeding, and enlarged lymph nodes.  Physical Exam   BP 126/76 (BP Location: Right Arm, Patient Position: Sitting,  Cuff Size: Normal)   Pulse 69   Temp 97.6 F (36.4 C) (Temporal)   Ht 5\' 1"  (1.549 m)   Wt 187 lb 9.6 oz (85.1 kg)   LMP 05/01/2021 (Approximate)   BMI 35.45 kg/m   General:   Alert and oriented. No distress noted. Pleasant and cooperative.  Head:  Normocephalic and atraumatic. Eyes:  Conjuctiva clear without scleral icterus. Mouth:  Oral mucosa pink and moist. Good dentition. No lesions. Abdomen:  +BS, soft, non-tender and non-distended. No rebound or guarding. No HSM or masses noted. Rectal: deferred Msk:  Symmetrical without gross deformities. Normal posture. Extremities:  Without edema. Neurologic:  Alert and  oriented x4 Psych:  Alert and cooperative. Normal mood and affect.   Assessment  Suzanne Lawrence is a 53 y.o. female with  a history of diabetes, GERD, DVT, HTN, anxiety, arthritis, depression, and GERD presenting today for follow-up of diarrhea.  GERD, epigastric pain: Symptoms have been well-controlled with pantoprazole BID.  She has 6 really weaned off of Carafate and has not had to use any in about a month.  She continues to take famotidine 40 mg nightly as well.  Given her symptoms have been well-controlled we will trial weaning off of her famotidine given it appears that the pantoprazole has been the difference maker and her symptoms.  Will plan to wean famotidine and monitor for recurrent symptoms.  Advised her that if her symptoms did recur that she could resume her famotidine.  GERD diet again reinforced.  Diarrhea that she was previously experiencing has subsided and this only occurs intermittently with greasy foods.  Prior workup with negative celiac serologies, fecal elastase, and normal TSH.  PLAN   Continue pantoprazole 40 mg BID. Refilled today.  GERD diet Trial of weaning off famotidine 40 mg nightly - reduce to every other day for 2 weeks then stop and monitor Follow up in 6 months.     Brooke Bonito, MSN, FNP-BC, AGACNP-BC [facility]

## 2023-05-27 ENCOUNTER — Encounter (HOSPITAL_COMMUNITY): Payer: Self-pay | Admitting: Hematology

## 2023-05-29 ENCOUNTER — Ambulatory Visit
Admission: RE | Admit: 2023-05-29 | Discharge: 2023-05-29 | Disposition: A | Discharge status: Home / Self Care | Payer: Medicaid Other | Source: Ambulatory Visit | Attending: Orthopedic Surgery | Admitting: Orthopedic Surgery

## 2023-05-29 DIAGNOSIS — Z9889 Other specified postprocedural states: Secondary | ICD-10-CM

## 2023-05-29 MED ORDER — IOPAMIDOL (ISOVUE-M 200) INJECTION 41%
10.0000 mL | Freq: Once | INTRAMUSCULAR | Status: AC
  Administered 2023-05-29: 10 mL via INTRA_ARTICULAR

## 2023-06-09 ENCOUNTER — Encounter: Payer: Self-pay | Admitting: Orthopedic Surgery

## 2023-06-09 ENCOUNTER — Ambulatory Visit: Payer: Medicaid Other | Admitting: Orthopedic Surgery

## 2023-06-09 DIAGNOSIS — M25461 Effusion, right knee: Secondary | ICD-10-CM | POA: Diagnosis not present

## 2023-06-09 NOTE — Progress Notes (Unsigned)
Office Visit Note   Patient: Suzanne Lawrence           Date of Birth: November 16, 1970           MRN: [id number] Visit Date: 06/09/2023 Requested by: Donetta Potts, MD [address] [place],  [place] [address] PCP: Donetta Potts, MD  Subjective: Chief Complaint  Patient presents with   Left Shoulder - Follow-up    Review MRI    HPI: Suzanne Lawrence is a 52 y.o. female who presents to the office reporting left shoulder pain.  Had rotator cuff tear surgery earlier this year.  MRI scan shows intact rotator cuff tear repair.  Right knee is also hurting.  Went to a pumpkin patch 2 days prior to the clinic visit and she had swelling.  Knee is actually hurting her more now than the shoulder.  Had right knee arthroscopy earlier this year as well.  She is on disability..                ROS: All systems reviewed are negative as they relate to the chief complaint within the history of present illness.  Patient denies fevers or chills.  Assessment & Plan: Visit Diagnoses:  1. Effusion, right knee     Plan: Impression is left shoulder rotator cuff intact with no real evidence of adhesive capsulitis.  Right knee has no effusion.  Hold off on injection for now.  Overall no actionable findings today to improve her knee and shoulder situation and she will follow-up with [place] as needed.  Follow-Up Instructions: No follow-ups on file.   Orders:  No orders of the defined types were placed in this encounter.  No orders of the defined types were placed in this encounter.     Procedures: No procedures performed   Clinical Data: No additional findings.  Objective: Vital Signs: LMP 05/01/2021 (Approximate)   Physical Exam:  Constitutional: Patient appears well-developed HEENT:  Head: Normocephalic Eyes:EOM are normal Neck: Normal range of motion Cardiovascular: Normal rate Pulmonary/chest: Effort normal Neurologic: Patient is alert Skin: Skin is warm Psychiatric: Patient has normal  mood and affect  Ortho Exam: Ortho exam demonstrates full range of motion of that right knee.  No effusion.  Collateral crucial he was are stable.  No patellofemoral crepitus.  Gait normal.  Left shoulder demonstrates pretty good rotator cuff strength with no coarseness with passive range of motion with internal/external rotation at 90 degrees of abduction.  Specialty Comments:  No specialty comments available.  Imaging: No results found.   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 01/25/2020   High blood pressure    Depression    Carpal tunnel syndrome    Anxiety    Past Medical History:  Diagnosis Date   Abnormal Pap smear of cervix    Anemia    issue with red blood cells being small   Anxiety    Arthritis    Carpal tunnel syndrome  right   Depression    Diabetes mellitus without complication (HCC)    DVT (deep venous thrombosis) (HCC) 03/2021   left arm   GERD (gastroesophageal reflux disease)    High blood pressure    History of hiatal hernia    Left breast mass    Pre-diabetes     Family History  Problem Relation Age of Onset   Lung cancer Mother    Other Father        multiple issues, doesn't know history for sure   Prostate cancer Father    Stroke Father    Aneurysm Paternal Grandmother    Breast cancer Maternal Aunt    Prostate cancer Maternal Uncle    Alcoholism Maternal Uncle    Aneurysm Cousin        paternal side   Aneurysm  Cousin        paternal side    Breast cancer Cousin        maternal side    High blood pressure Other        father's side    Cerebral aneurysm Other        cerebral, on father's side    Alcoholism Other        maternal side    Colon cancer Neg Hx     Past Surgical History:  Procedure Laterality Date   BIOPSY  02/05/2022   Procedure: BIOPSY;  Surgeon: Lanelle Bal, DO;  Location: [facility];  Service: Endoscopy;;   COLONOSCOPY  2022   Megan Straughan: 7 polyps removed, has to return in one year   COLONOSCOPY  09/2021   Megan Straughan: 12 polyps removed   ESOPHAGOGASTRODUODENOSCOPY (EGD) WITH PROPOFOL N/A 02/05/2022   Procedure: ESOPHAGOGASTRODUODENOSCOPY (EGD) WITH PROPOFOL;  Surgeon: Lanelle Bal, DO;  Location: [facility];  Service: Endoscopy;  Laterality: N/A;  2:00pm   KNEE ARTHROSCOPY Right 10/28/2022   Procedure: RIGHT KNEE ARTHROSCOPY, DEBRIDEMENT;  Surgeon: Cammy Copa, MD;  Location: [facility];  Service: Orthopedics;  Laterality: Right;   left shoulder arthroscopy  12/2020   SHOULDER ARTHROSCOPY WITH OPEN ROTATOR CUFF REPAIR AND DISTAL CLAVICLE ACROMINECTOMY Left 03/21/2022   Procedure: LEFT SHOULDER ARTHOSCOPY, DEBRIDEMENT,  MANIPULATION, ROTATOR CUFF REPAIR, ARTHROSCOPIC DISTAL CLAVICLE EXCISION;  Surgeon: Cammy Copa, MD;  Location: [facility];  Service: Orthopedics;  Laterality: Left;   TUBAL LIGATION     Social History   Occupational History   Not on file  Tobacco Use   Smoking status: Every Day    Current packs/day: 0.50    Types: Cigarettes   Smokeless tobacco: Never   Tobacco comments:    Working on cutting down on smoking. Down to 8 cigarrettes a day now.  Vaping Use   Vaping status: Never Used  Substance and Sexual Activity   Alcohol use: Not Currently   Drug use: Not Currently   Sexual activity: Yes    Birth control/protection: None

## 2023-06-10 ENCOUNTER — Other Ambulatory Visit (HOSPITAL_COMMUNITY): Payer: Medicaid Other | Attending: Medical Genetics

## 2023-06-11 ENCOUNTER — Ambulatory Visit: Payer: Medicaid Other | Admitting: Orthopedic Surgery

## 2023-06-18 ENCOUNTER — Telehealth: Payer: Self-pay | Admitting: Orthopedic Surgery

## 2023-06-18 NOTE — Telephone Encounter (Signed)
Patient last seen 06/09/23, has submitted forms. Please advise of work status. Thank you.

## 2023-06-23 NOTE — Telephone Encounter (Signed)
She would be oow for 3 days from knee max - shoulder ok with no recurrent rct - has she been oow and for how long?

## 2023-06-25 ENCOUNTER — Other Ambulatory Visit: Payer: Self-pay | Admitting: *Deleted

## 2023-06-25 ENCOUNTER — Telehealth: Payer: Self-pay | Admitting: *Deleted

## 2023-06-25 DIAGNOSIS — Z794 Long term (current) use of insulin: Secondary | ICD-10-CM

## 2023-06-25 DIAGNOSIS — E1165 Type 2 diabetes mellitus with hyperglycemia: Secondary | ICD-10-CM

## 2023-06-25 MED ORDER — ACCU-CHEK GUIDE VI STRP: ORAL_STRIP | 1 refills | Status: AC

## 2023-06-25 MED ORDER — INSULIN GLARGINE 100 UNITS/ML SOLOSTAR PEN
50.0000 [IU] | PEN_INJECTOR | Freq: Every day | SUBCUTANEOUS | 0 refills | Status: DC
Start: 2023-06-25 — End: 2023-09-23

## 2023-06-25 MED ORDER — ACCU-CHEK SOFTCLIX LANCETS MISC: 1.0000 | Freq: Four times a day (QID) | 1 refills | Status: AC

## 2023-06-25 MED ORDER — HUMALOG JUNIOR KWIKPEN 100 UNIT/ML ~~LOC~~ SOPN: 12.0000 [IU] | PEN_INJECTOR | Freq: Three times a day (TID) | SUBCUTANEOUS | 0 refills | Status: DC

## 2023-06-25 NOTE — Telephone Encounter (Signed)
Contacted the patient and shared Whitney's recommendations. Patient states that she will have her daughter to take her to the ED for further evaluation. Updated prescriptions updated.

## 2023-06-25 NOTE — Telephone Encounter (Signed)
Patient states that the pharmacy told her that she needed to get her Lispro adjusted as she is running out to quickly. Pt called with high BG readings.   Date Before breakfast Before lunch Before supper Bedtime  06/22/23 404 16 units Lispro 573 16 units Lispro 538 16 units Lispro  Lantus 38 units at bedtime  06/23/23 419  515 HIGH Lantus 38 units at bedtime  06/24/23 565 583 HIGH Lantus 38 units at bedtime  06/25/23 576       Pt taking: Patient states that she is injecting 38 units at bedtime. The Lispro she is injecting 11-16 units, she has been without this since 06/22/23. She was on Ozempic and this was helping but insurance said something about Metformin, patient has tried this before, she states. Patient will also need her test strips ([facility], [place]), [place]) Her insulins are from the [facility]) Patient has appointment 06/30/23. She feel sluggish, tired, extremely thirsty, ect.

## 2023-06-25 NOTE — Telephone Encounter (Signed)
I didn't prescribe the Ozempic, that was a different provider (maybe her PCP?).  We should be able to at least regain some control with insulin alone, we can certainly talk about GLP1 in our upcoming appt though.  I do think she needs to go to the ED to make sure she doesn't have an underlying infection or something.  At the very least she may need some IV fluids as glucose that high can lead to dehydration.  She most certainly will need more insulin though.  Have her increase her Lantus to 50 units nightly and increase her Lispro to 12-18 units TID right before meals to help with those spikes.  We can certainly send in a refill for her now with the new instructions so she can get a refill.

## 2023-06-25 NOTE — Telephone Encounter (Signed)
I tried to call the patient and it went straight to voicemail. Will try again.

## 2023-06-26 ENCOUNTER — Encounter (HOSPITAL_BASED_OUTPATIENT_CLINIC_OR_DEPARTMENT_OTHER): Payer: Self-pay

## 2023-06-26 ENCOUNTER — Telehealth: Payer: Self-pay | Admitting: Orthopedic Surgery

## 2023-06-26 NOTE — Telephone Encounter (Signed)
Sounds good to me

## 2023-06-26 NOTE — Telephone Encounter (Signed)
Note in chart 

## 2023-06-26 NOTE — Telephone Encounter (Signed)
Patient last seen 06/09/23. (Last work note was for 03/10/23-06/09/23). Please advise of current work status. (New forms received) Thank you!

## 2023-06-26 NOTE — Telephone Encounter (Signed)
Okay to return to work at this point from our standpoint based on Dr Diamantina Providence last note

## 2023-06-26 NOTE — Telephone Encounter (Signed)
Noted for [employer]. 

## 2023-06-26 NOTE — Telephone Encounter (Signed)
Should I say return to work on Monday?

## 2023-06-29 NOTE — Telephone Encounter (Signed)
Cannot really keep her out of work any longer.  Rotator cuff repair intact on MRI scan from October and she had knee injection and that should be improved by now as well.  So terms of keeping her out of work more there is nothing really that we can refer to to keep her out of work at this time

## 2023-06-30 ENCOUNTER — Encounter (HOSPITAL_COMMUNITY): Payer: Self-pay | Admitting: Hematology

## 2023-06-30 ENCOUNTER — Encounter: Payer: Self-pay | Admitting: Nurse Practitioner

## 2023-06-30 ENCOUNTER — Ambulatory Visit (INDEPENDENT_AMBULATORY_CARE_PROVIDER_SITE_OTHER): Payer: 59 | Admitting: Nurse Practitioner

## 2023-06-30 VITALS — BP 114/79 | HR 66 | Ht 61.0 in | Wt 186.0 lb

## 2023-06-30 DIAGNOSIS — E1165 Type 2 diabetes mellitus with hyperglycemia: Secondary | ICD-10-CM

## 2023-06-30 DIAGNOSIS — I10 Essential (primary) hypertension: Secondary | ICD-10-CM

## 2023-06-30 DIAGNOSIS — Z794 Long term (current) use of insulin: Secondary | ICD-10-CM | POA: Diagnosis not present

## 2023-06-30 NOTE — Telephone Encounter (Signed)
FYI note done on 06/26/23

## 2023-06-30 NOTE — Telephone Encounter (Signed)
Noted for [employer]. 

## 2023-06-30 NOTE — Progress Notes (Signed)
Endocrinology Follow Up Note       06/30/2023, 11:36 AM   Subjective:    Patient ID: Suzanne Lawrence, female    DOB: [birth year].  [name] is being seen in follow up after being seen in consultation for management of currently uncontrolled symptomatic diabetes requested by  Donetta Potts, MD.   Past Medical History:  Diagnosis Date   Abnormal Pap smear of cervix    Anemia    issue with red blood cells being small   Anxiety    Arthritis    Carpal tunnel syndrome    right   Depression    Diabetes mellitus without complication (HCC)    DVT (deep venous thrombosis) (HCC) 03/2021   left arm   GERD (gastroesophageal reflux disease)    High blood pressure    History of hiatal hernia    Left breast mass    Pre-diabetes     Past Surgical History:  Procedure Laterality Date   BIOPSY  02/05/2022   Procedure: BIOPSY;  Surgeon: Lanelle Bal, DO;  Location: [facility];  Service: Endoscopy;;   COLONOSCOPY  2022   Megan Straughan: 7 polyps removed, has to return in one year   COLONOSCOPY  09/2021   Megan Straughan: 12 polyps removed   ESOPHAGOGASTRODUODENOSCOPY (EGD) WITH PROPOFOL N/A 02/05/2022   Procedure: ESOPHAGOGASTRODUODENOSCOPY (EGD) WITH PROPOFOL;  Surgeon: Lanelle Bal, DO;  Location: [facility];  Service: Endoscopy;  Laterality: N/A;  2:00pm   KNEE ARTHROSCOPY Right 10/28/2022   Procedure: RIGHT KNEE ARTHROSCOPY, DEBRIDEMENT;  Surgeon: Cammy Copa, MD;  Location: [facility];  Service: Orthopedics;  Laterality: Right;   left shoulder arthroscopy  12/2020   SHOULDER ARTHROSCOPY WITH OPEN ROTATOR CUFF REPAIR AND DISTAL CLAVICLE ACROMINECTOMY Left 03/21/2022   Procedure: LEFT SHOULDER ARTHOSCOPY, DEBRIDEMENT,  MANIPULATION, ROTATOR CUFF REPAIR, ARTHROSCOPIC DISTAL CLAVICLE EXCISION;  Surgeon: Cammy Copa, MD;  Location: [facility];  Service: Orthopedics;   Laterality: Left;   TUBAL LIGATION      Social History   Socioeconomic History   Marital status: Single    Spouse name: Not on file   Number of children: 3   Years of education: Not on file   Highest education level: Some college, no degree  Occupational History   Not on file  Tobacco Use   Smoking status: Every Day    Current packs/day: 0.50    Types: Cigarettes   Smokeless tobacco: Never   Tobacco comments:    Working on cutting down on smoking. Down to 8 cigarrettes a day now.  Vaping Use   Vaping status: Never Used  Substance and Sexual Activity   Alcohol use: Not Currently   Drug use: Not Currently   Sexual activity: Yes    Birth control/protection: None  Other Topics Concern   Not on file  Social History Narrative   Lives at home with 2 daughters   Right handed   Caffeine: about 2-3 per day    Social Determinants of Health   Financial Resource Strain: Low Risk  (10/25/2021)   Received from [facility], [facility]   Overall Financial Resource Strain (CARDIA)  Difficulty of Paying Living Expenses: Not hard at all  Food Insecurity: No Food Insecurity (06/26/2023)   Received from [facility]   Hunger Vital Sign    Worried About Running Out of Food in the Last Year: Never true    Ran Out of Food in the Last Year: Never true  Transportation Needs: No Transportation Needs (10/25/2021)   Received from [facility], [facility]   [facility] - Transportation    Lack of Transportation (Medical): No    Lack of Transportation (Non-Medical): No  Physical Activity: Inactive (02/05/2023)   Received from [facility], [facility]   Exercise Vital Sign    Days of Exercise per Week: 0 days    Minutes of Exercise per Session: 0 min  Stress: Stress Concern Present (02/05/2023)   Received from [facility], [facility] - Occupational Stress Questionnaire    Feeling of Stress : To some extent  Social  Connections: Moderately Isolated (10/25/2021)   Received from [facility], [facility]   Social Connection and Isolation Panel [NHANES]    Frequency of Communication with Friends and Family: More than three times a week    Frequency of Social Gatherings with Friends and Family: Never    Attends Religious Services: More than 4 times per year    Active Member of [employer] or Organizations: No    Attends Engineer, structural: Never    Marital Status: Never married    Family History  Problem Relation Age of Onset   Lung cancer Mother    Other Father        multiple issues, doesn't know history for sure   Prostate cancer Father    Stroke Father    Aneurysm Paternal Grandmother    Breast cancer Maternal Aunt    Prostate cancer Maternal Uncle    Alcoholism Maternal Uncle    Aneurysm Cousin        paternal side   Aneurysm Cousin        paternal side    Breast cancer Cousin        maternal side    High blood pressure Other        father's side    Cerebral aneurysm Other        cerebral, on father's side    Alcoholism Other        maternal side    Colon cancer Neg Hx     Outpatient Encounter Medications as of 06/30/2023  Medication Sig   Semaglutide,0.25 or 0.5MG /DOS, (OZEMPIC, 0.25 OR 0.5 MG/DOSE,) 2 MG/1.5ML SOPN Inject 0.25 mg into the skin once a week.   ACCU-CHEK GUIDE test strip Use 1 each to check blood glucose four times daily   Accu-Chek Softclix Lancets lancets 1 each by Other route 4 (four) times daily.   acetaminophen (TYLENOL) 325 MG tablet Take 650 mg by mouth every 6 (six) hours as needed for moderate pain.   ARIPiprazole (ABILIFY) 5 MG tablet Take 5 mg by mouth daily.   atenolol (TENORMIN) 50 MG tablet Take 50 mg by mouth daily.   baclofen (LIORESAL) 10 MG tablet Take 10 mg by mouth 3 (three) times daily as needed (Back pain).   Blood Glucose Monitoring Suppl (ACCU-CHEK GUIDE ME) w/Device KIT See admin instructions.   chlorthalidone (HYGROTON) 25 MG  tablet Take 25 mg by mouth every morning.   Continuous Glucose Sensor (DEXCOM G7 SENSOR) MISC  Use to continuously monitor glucose. Change sensor every 10 days.   DULoxetine (CYMBALTA) 60 MG capsule Take 60 mg by mouth daily in the afternoon.   famotidine (PEPCID) 40 MG tablet Take 1 tablet by mouth once daily   furosemide (LASIX) 20 MG tablet Take 1 tablet (20 mg total) by mouth daily as needed.   gabapentin (NEURONTIN) 300 MG capsule Take 300 mg by mouth at bedtime.   insulin glargine (LANTUS) 100 unit/mL SOPN Inject 50 Units into the skin at bedtime.   Insulin lispro (HUMALOG JUNIOR KWIKPEN) 100 UNIT/ML Inject 12-18 Units into the skin 3 (three) times daily.   lisinopril (ZESTRIL) 40 MG tablet Take 40 mg by mouth daily.   meclizine (ANTIVERT) 25 MG tablet Take 25 mg by mouth 3 (three) times daily as needed for dizziness.   pantoprazole (PROTONIX) 40 MG tablet Take 1 tablet (40 mg total) by mouth 2 (two) times daily before a meal.   spironolactone (ALDACTONE) 25 MG tablet Take 12.5 mg by mouth daily.   sucralfate (CARAFATE) 1 g tablet Take 1 g by mouth daily as needed (Acid reflux).   traZODone (DESYREL) 50 MG tablet Take 50-100 mg by mouth at bedtime.   [DISCONTINUED] insulin lispro (HUMALOG) 100 UNIT/ML injection Inject 6-12 Units into the skin 3 (three) times daily with meals.   [DISCONTINUED] OZEMPIC, 0.25 OR 0.5 MG/DOSE, 2 MG/3ML SOPN SMARTSIG:0.25 Milligram(s) SUB-Q Once a Week   [DISCONTINUED] SEMGLEE, YFGN, 100 UNIT/ML Pen SMARTSIG:40 Unit(s) SUB-Q Daily   No facility-administered encounter medications on file as of 06/30/2023.    ALLERGIES: Allergies  Allergen Reactions   Ferumoxytol Other (See Comments)    Hot flash, nausea, chest pressure    Hydrochlorothiazide Other (See Comments)     "urinary frequency"; was going every 20 minutes and was causing problems at work    VACCINATION STATUS: Immunization History  Administered Date(s) Administered   [employer]  Vaccination 07/06/2020    Diabetes She presents for her follow-up diabetic visit. She has type 2 diabetes mellitus. Onset time: diagnosed at approx age of 79 (in June) Her disease course has been worsening. There are no hypoglycemic associated symptoms. Associated symptoms include fatigue. Pertinent negatives for diabetes include no blurred vision, no polydipsia, no polyuria, no visual change and no weight loss. There are no hypoglycemic complications. Symptoms are stable. There are no diabetic complications. Risk factors for coronary artery disease include diabetes mellitus, dyslipidemia, family history, obesity, hypertension, sedentary lifestyle and tobacco exposure. Current diabetic treatment includes intensive insulin program (and Ozempic). She is compliant with treatment some of the time (ran out of meds for a bit of time). Her weight is decreasing steadily. She is following a generally healthy diet. When asked about meal planning, she reported none. She has had a previous visit with a dietitian. She rarely participates in exercise. Her home blood glucose trend is increasing steadily. Her breakfast blood glucose range is generally >200 mg/dl. Her lunch blood glucose range is generally >200 mg/dl. Her dinner blood glucose range is generally >200 mg/dl. Her bedtime blood glucose range is generally >200 mg/dl. Her overall blood glucose range is >200 mg/dl. (She presents today with her logs showing gross hyperglycemia overall in the high 300-400 range.  She notes she ran out of insulin for a period of time, just restarted last Friday.  She notes she has A1c done at her PCP office on 10/16 which was 9.1% (will try to get results faxed to the office).  She also notes she  was able to get her Ozempic and has plans to restart that this week at 0.25 mg.) An ACE inhibitor/angiotensin II receptor blocker is being taken. She sees a podiatrist.Eye exam is not current.     Review of systems  Constitutional: +  decreasing body weight, current Body mass index is 35.14 kg/m., no fatigue, no subjective hyperthermia, no subjective hypothermia Eyes: + blurry vision-improving, no xerophthalmia ENT: no sore throat, no nodules palpated in throat, no dysphagia/odynophagia, no hoarseness Cardiovascular: no chest pain, no shortness of breath, no palpitations, no leg swelling Respiratory: no cough, no shortness of breath Gastrointestinal: no nausea/vomiting/diarrhea Musculoskeletal: no muscle/joint aches Skin: no rashes, no hyperemia Neurological: no tremors, no numbness, no tingling, no dizziness Psychiatric: no depression, no anxiety  Objective:     BP 114/79   Pulse 66   Ht 5\' 1"  (1.549 m)   Wt 186 lb (84.4 kg)   LMP 05/01/2021 (Approximate)   BMI 35.14 kg/m   Wt Readings from Last 3 Encounters:  06/30/23 186 lb (84.4 kg)  05/19/23 187 lb 9.6 oz (85.1 kg)  03/27/23 191 lb 12.8 oz (87 kg)     BP Readings from Last 3 Encounters:  06/30/23 114/79  05/19/23 126/76  03/27/23 117/74     Physical Exam- Limited  Constitutional:  Body mass index is 35.14 kg/m. , not in acute distress, normal state of mind Eyes:  EOMI, no exophthalmos Musculoskeletal: no gross deformities, strength intact in all four extremities, no gross restriction of joint movements Skin:  no rashes, no hyperemia Neurological: no tremor with outstretched hands    CMP ( most recent) CMP     Component Value Date/Time   NA 136 10/22/2022 1233   K 3.8 10/22/2022 1233   CL 97 (L) 10/22/2022 1233   CO2 29 10/22/2022 1233   GLUCOSE 99 10/22/2022 1233   BUN 9 10/22/2022 1233   BUN 6 08/06/2022 0000   CREATININE 0.79 10/22/2022 1233   CALCIUM 9.2 10/22/2022 1233   ALBUMIN 5.3 (A) 08/02/2022 0000   AST 67 (A) 08/06/2022 0000   ALT 63 (A) 08/06/2022 0000   ALKPHOS 125 08/02/2022 0000   GFRNONAA >60 10/22/2022 1233     Diabetic Labs (most recent): Lab Results  Component Value Date   HGBA1C 7.4 (A) 12/25/2022    HGBA1C 12.6 (A) 09/02/2022   HGBA1C 7.6 06/03/2022   MICROALBUR 10 mg/L 09/30/2022     Lipid Panel ( most recent) Lipid Panel  No results found for: "CHOL", "TRIG", "HDL", "CHOLHDL", "VLDL", "LDLCALC", "LDLDIRECT", "LABVLDL"    Lab Results  Component Value Date   TSH 3.600 03/17/2023   FREET4 0.93 03/17/2023         Assessment & Plan:   1) Type 2 diabetes mellitus with hyperglycemia, with long-term current use of insulin (HCC)  She presents today with her logs showing gross hyperglycemia overall in the high 300-400 range.  She notes she ran out of insulin for a period of time, just restarted last Friday.  She notes she has A1c done at her PCP office on 10/16 which was 9.1% (will try to get results faxed to the office).  She also notes she was able to get her Ozempic and has plans to restart that this week at 0.25 mg.  - Marquelle Balow has currently uncontrolled symptomatic type 2 DM since 52 years of age.   -Recent labs reviewed.  - I had a long discussion with her about the progressive nature of diabetes and the  pathology behind its complications. -her diabetes is not currently complicated but she remains at a high risk for more acute and chronic complications which include CAD, CVA, CKD, retinopathy, and neuropathy. These are all discussed in detail with her.  The following Lifestyle Medicine recommendations according to American College of Lifestyle Medicine [facility]) were discussed and offered to patient and she agrees to start the journey:  A. Whole Foods, Plant-based plate comprising of fruits and vegetables, plant-based proteins, whole-grain carbohydrates was discussed in detail with the patient.   A list for source of those nutrients were also provided to the patient.  Patient will use only water or unsweetened tea for hydration. B.  The need to stay away from risky substances including alcohol, smoking; obtaining 7 to 9 hours of restorative sleep, at least 150 minutes of  moderate intensity exercise weekly, the importance of healthy social connections,  and stress reduction techniques were discussed. C.  A full color page of  Calorie density of various food groups per pound showing examples of each food groups was provided to the patient.  - Nutritional counseling repeated at each appointment due to patients tendency to fall back in to old habits.  - The patient admits there is a room for improvement in their diet and drink choices. -  Suggestion is made for the patient to avoid simple carbohydrates from their diet including Cakes, Sweet Desserts / Pastries, Ice Cream, Soda (diet and regular), Sweet Tea, Candies, Chips, Cookies, Sweet Pastries, Store Bought Juices, Alcohol in Excess of 1-2 drinks a day, Artificial Sweeteners, Coffee Creamer, and "Sugar-free" Products. This will help patient to have stable blood glucose profile and potentially avoid unintended weight gain.   - I encouraged the patient to switch to unprocessed or minimally processed complex starch and increased protein intake (animal or plant source), fruits, and vegetables.   - Patient is advised to stick to a routine mealtimes to eat 3 meals a day and avoid unnecessary snacks (to snack only to correct hypoglycemia).  - she has been seeing Norm Salt, RDN, CDE for diabetes education.  - I have approached her with the following individualized plan to manage her diabetes and patient agrees:   She is advised to increase her Lantus to 60 units SQ nightly and increase Humalog to 14-20 units TID with meals if glucose is above 90 and she is eating (Specific instructions on how to titrate insulin dosage based on glucose readings given to patient in writing).  She can restart Ozempic 0.25 mg SQ weekly x 2 doses then increase to 0.5 mg SQ weekly.  She does smoke which puts her at risk for pancreatitis, so we did go over s/s to watch for.  -she is encouraged to continue using CGM to monitor glucose 4 times  daily, before meals and before bed, and to call the clinic if she has readings less than 70 or above 300 for 3 tests in a row.    - she is warned not to take insulin without proper monitoring per orders. - Adjustment parameters are given to her for hypo and hyperglycemia in writing.  - Specific targets for  A1c; LDL, HDL, and Triglycerides were discussed with the patient.  2) Blood Pressure /Hypertension:  her blood pressure is controlled to target.   she is advised to continue her current medications including Lisinopril 40 mg p.o. daily with breakfast, and Atenolol 50 mg po daily.  3) Lipids/Hyperlipidemia:    Her recent lipid panel from 02/28/23 shows controlled  LDL of 99 and elevated triglycerides of 323.  She is not currently on any lipid lowering medications.  4)  Weight/Diet:  her Body mass index is 35.14 kg/m.  -  clearly complicating her diabetes care.   she is a candidate for weight loss. I discussed with her the fact that loss of 5 - 10% of her  current body weight will have the most impact on her diabetes management.  Exercise, and detailed carbohydrates information provided  -  detailed on discharge instructions.  5) Chronic Care/Health Maintenance: -she is on ACEI/ARB and not on Statin medications and is encouraged to initiate and continue to follow up with Ophthalmology, Dentist, Podiatrist at least yearly or according to recommendations, and advised to QUIT SMOKING. I have recommended yearly flu vaccine and pneumonia vaccine at least every 5 years; moderate intensity exercise for up to 150 minutes weekly; and sleep for at least 7 hours a day.  - she is advised to maintain close follow up with Donetta Potts, MD for primary care needs, as well as her other providers for optimal and coordinated care.      I spent  45  minutes in the care of the patient today including review of labs from CMP, Lipids, Thyroid Function, Hematology (current and previous including abstractions  from other facilities); face-to-face time discussing  her blood glucose readings/logs, discussing hypoglycemia and hyperglycemia episodes and symptoms, medications doses, her options of short and long term treatment based on the latest standards of care / guidelines;  discussion about incorporating lifestyle medicine;  and documenting the encounter. Risk reduction counseling performed per USPSTF guidelines to reduce obesity and cardiovascular risk factors.     Please refer to Patient Instructions for Blood Glucose Monitoring and Insulin/Medications Dosing Guide"  in media tab for additional information. Please  also refer to " Patient Self Inventory" in the Media  tab for reviewed elements of pertinent patient history.  [name] participated in the discussions, expressed understanding, and voiced agreement with the above plans.  All questions were answered to her satisfaction. she is encouraged to contact clinic should she have any questions or concerns prior to her return visit.     Follow up plan: - Return in about 3 months (around 09/30/2023) for Diabetes F/U with A1c in office, No previsit labs, Bring meter and logs.   Ronny Bacon, [facility] [address] [place], [place] [address] Phone: [phone] Fax: [phone]  06/30/2023, 11:36 AM

## 2023-07-09 ENCOUNTER — Telehealth: Payer: Self-pay | Admitting: Radiology

## 2023-07-09 ENCOUNTER — Encounter: Payer: Self-pay | Admitting: Surgical

## 2023-07-09 ENCOUNTER — Encounter (HOSPITAL_COMMUNITY): Payer: Self-pay | Admitting: Hematology

## 2023-07-09 ENCOUNTER — Ambulatory Visit (INDEPENDENT_AMBULATORY_CARE_PROVIDER_SITE_OTHER): Payer: 59 | Admitting: Surgical

## 2023-07-09 DIAGNOSIS — M25561 Pain in right knee: Secondary | ICD-10-CM | POA: Diagnosis not present

## 2023-07-09 DIAGNOSIS — G8929 Other chronic pain: Secondary | ICD-10-CM

## 2023-07-09 DIAGNOSIS — M1711 Unilateral primary osteoarthritis, right knee: Secondary | ICD-10-CM | POA: Diagnosis not present

## 2023-07-09 MED ORDER — MELOXICAM 15 MG PO TABS: 15.0000 mg | ORAL_TABLET | Freq: Every day | ORAL | 0 refills | Status: DC

## 2023-07-09 NOTE — Telephone Encounter (Signed)
Right knee gel injection  

## 2023-07-09 NOTE — Telephone Encounter (Signed)
VOB submitted for Durolane, right knee.

## 2023-07-09 NOTE — Progress Notes (Signed)
Office Visit Note   Patient: Suzanne Lawrence           Date of Birth: Feb 19, 1971           MRN: [id number] Visit Date: 07/09/2023 Requested by: Donetta Potts, MD [address] [place],  [place] [address] PCP: Donetta Potts, MD  Subjective: Chief Complaint  Patient presents with   Right Knee - Pain    HPI: Suzanne Lawrence is a 52 y.o. female who presents to the office reporting right knee pain.  Patient describes the pain localizing to the lateral aspect of the knee with some radiation to the anterior medial aspect of the knee.  This her pain is in the lateral side.  Has some weakness on occasion with subjective instability of the knee and buckling.  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  She has not had any recent injection.  She has previous MRI demonstrating mild arthritis of the right knee and had prior right knee arthroscopy with meniscal debridement that was done in March 2024.  No new injury..                ROS: All systems reviewed are negative as they relate to the chief complaint within the history of present illness.  Patient denies fevers or chills.  Assessment & Plan: Visit Diagnoses:  1. Chronic pain of right knee   2. Arthritis of right knee     Plan: Patient is a 52 year old female who presents for evaluation of right knee pain.  Patient states that she has right knee pain that has been giving her symptoms for several months.  Takes Aleve to help with her symptoms but it is causing her enough discomfort that she has not been able to return to work.  Has prior knee arthroscopy with meniscal debridement of lateral meniscus.  After discussion of options, patient would like to try meloxicam to take instead of the current over-the-counter medications that she is taking.  She will start physical therapy in [place] to focus on range of motion and strengthening for a person with history of arthritis.  Plan to preapproved patient for gel injection of  the right knee.  She wants to avoid cortisone injection after having prior cortisone injection in another joint with increased pain from that.  Follow-up after physical therapy to see if she has had a significant improvement and if not, we will try gel injection.  This patient is diagnosed with osteoarthritis of the knee(s).    Radiographs show evidence of joint space narrowing, osteophytes, subchondral sclerosis and/or subchondral cysts.  This patient has knee pain which interferes with functional and activities of daily living.    This patient has experienced inadequate response, adverse effects and/or intolerance with conservative treatments such as acetaminophen, NSAIDS, topical creams, physical therapy or regular exercise, knee bracing and/or weight loss.   This patient has experienced inadequate response or has a contraindication to intra articular steroid injections for at least 3 months.   This patient is not scheduled to have a total knee replacement within 6 months of starting treatment with viscosupplementation.   Follow-Up Instructions: No follow-ups on file.   Orders:  Orders Placed This Encounter  Procedures   Ambulatory referral to Physical Therapy   Meds ordered this encounter  Medications   DISCONTD: meloxicam (MOBIC) 15 MG tablet    Sig: Take 1 tablet (15 mg total) by mouth daily.    Dispense:  60 tablet    Refill:  0   meloxicam (MOBIC) 15 MG tablet    Sig: Take 1 tablet (15 mg total) by mouth daily.    Dispense:  60 tablet    Refill:  0      Procedures: No procedures performed   Clinical Data: No additional findings.  Objective: Vital Signs: LMP 05/01/2021 (Approximate)   Physical Exam:  Constitutional: Patient appears well-developed HEENT:  Head: Normocephalic Eyes:EOM are normal Neck: Normal range of motion Cardiovascular: Normal rate Pulmonary/chest: Effort normal Neurologic: Patient is alert Skin: Skin is warm Psychiatric: Patient has  normal mood and affect  Ortho Exam: Ortho exam demonstrates right knee with tenderness over the lateral joint line moderately and medial joint line mildly.  No instability to varus or valgus stress at 0 or 30 degrees.  Able to perform straight leg raise.  Stable to anterior and posterior drawer sign.  Stable Lachman exam.  Ambulates without significant antalgia.  No significant effusion noted.  She has range of motion from 3 degrees extension to about 110 degrees of knee flexion.  No calf tenderness.  Negative Homan sign.  Specialty Comments:  No specialty comments available.  Imaging: No results found.   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 01/25/2020   High blood pressure    Depression    Carpal tunnel syndrome    Anxiety    Past Medical History:  Diagnosis Date   Abnormal Pap smear of cervix    Anemia    issue with red blood cells being small   Anxiety    Arthritis    Carpal tunnel syndrome    right   Depression    Diabetes mellitus without complication (HCC)    DVT (deep venous thrombosis) (HCC) 03/2021   left arm   GERD (gastroesophageal reflux disease)    High blood pressure    History of hiatal hernia    Left breast mass    Pre-diabetes     Family History  Problem Relation Age of Onset   Lung cancer Mother    Other Father        multiple issues, doesn't know history for sure   Prostate cancer Father    Stroke Father    Aneurysm Paternal  Grandmother    Breast cancer Maternal Aunt    Prostate cancer Maternal Uncle    Alcoholism Maternal Uncle    Aneurysm Cousin        paternal side   Aneurysm Cousin        paternal side    Breast cancer Cousin        maternal side    High blood pressure Other        father's side    Cerebral aneurysm Other        cerebral, on father's side    Alcoholism Other        maternal side    Colon cancer Neg Hx  Past Surgical History:  Procedure Laterality Date   BIOPSY  02/05/2022   Procedure: BIOPSY;  Surgeon: Lanelle Bal, DO;  [facility]: [facility];  Service: Endoscopy;;   COLONOSCOPY  2022   Megan Straughan: 7 polyps removed, has to return in one year   COLONOSCOPY  09/2021   Arlyn Dunning: 12 polyps removed   ESOPHAGOGASTRODUODENOSCOPY (EGD) WITH PROPOFOL N/A 02/05/2022   Procedure: ESOPHAGOGASTRODUODENOSCOPY (EGD) WITH PROPOFOL;  Surgeon: Lanelle Bal, DO;  Location: [facility];  Service: Endoscopy;  Laterality: N/A;  2:00pm   KNEE ARTHROSCOPY Right 10/28/2022   Procedure: RIGHT KNEE ARTHROSCOPY, DEBRIDEMENT;  Surgeon: Cammy Copa, MD;  Location: [facility];  Service: Orthopedics;  Laterality: Right;   left shoulder arthroscopy  12/2020   SHOULDER ARTHROSCOPY WITH OPEN ROTATOR CUFF REPAIR AND DISTAL CLAVICLE ACROMINECTOMY Left 03/21/2022   Procedure: LEFT SHOULDER ARTHOSCOPY, DEBRIDEMENT,  MANIPULATION, ROTATOR CUFF REPAIR, ARTHROSCOPIC DISTAL CLAVICLE EXCISION;  Surgeon: Cammy Copa, MD;  Location: [facility];  Service: Orthopedics;  Laterality: Left;   TUBAL LIGATION     Social History   Occupational History   Not on file  Tobacco Use   Smoking status: Every Day    Current packs/day: 0.50    Types: Cigarettes   Smokeless tobacco: Never   Tobacco comments:    Working on cutting down on smoking. Down to 8 cigarrettes a day now.  Vaping Use   Vaping status: Never Used  Substance and Sexual Activity   Alcohol use: Not Currently    Drug use: Not Currently   Sexual activity: Yes    Birth control/protection: None

## 2023-07-15 ENCOUNTER — Telehealth: Payer: Self-pay | Admitting: Surgical

## 2023-07-15 NOTE — Telephone Encounter (Signed)
Noted for [employer]. 

## 2023-07-15 NOTE — Telephone Encounter (Signed)
Please see note from [facility]

## 2023-07-15 NOTE — Telephone Encounter (Signed)
Last seen 11/13, note states she has been oow. Please advise how long will be oow. Thank you!

## 2023-07-15 NOTE — Telephone Encounter (Signed)
From my standpoint she should be good to return to work now that the injection has likely taken effect

## 2023-07-16 ENCOUNTER — Ambulatory Visit (HOSPITAL_COMMUNITY): Payer: 59 | Attending: Surgical

## 2023-07-16 ENCOUNTER — Other Ambulatory Visit: Payer: Self-pay

## 2023-07-16 DIAGNOSIS — R262 Difficulty in walking, not elsewhere classified: Secondary | ICD-10-CM | POA: Insufficient documentation

## 2023-07-16 DIAGNOSIS — M25561 Pain in right knee: Secondary | ICD-10-CM | POA: Diagnosis present

## 2023-07-16 DIAGNOSIS — G8929 Other chronic pain: Secondary | ICD-10-CM | POA: Insufficient documentation

## 2023-07-16 DIAGNOSIS — M6281 Muscle weakness (generalized): Secondary | ICD-10-CM | POA: Diagnosis present

## 2023-07-16 NOTE — Therapy (Signed)
[facility] OUTPATIENT PHYSICAL THERAPY EVALUATION (LOWER EXTREMITY)   Patient Name: Suzanne Lawrence MRN: [id number] DOB:04-12-1971, 52 y.o., female Today's Date: 07/16/2023  END OF SESSION:   PT End of Session - 07/16/23 1436     Visit Number 1    Number of Visits 12    Authorization Type [employer] Dual    Authorization Time Period no auth    Progress Note Due on Visit 10    PT Start Time 0235    PT Stop Time 0315    PT Time Calculation (min) 40 min    Activity Tolerance Patient tolerated treatment well    Behavior During Therapy WFL for tasks assessed/performed              Past Medical History:  Diagnosis Date   Abnormal Pap smear of cervix    Anemia    issue with red blood cells being small   Anxiety    Arthritis    Carpal tunnel syndrome    right   Depression    Diabetes mellitus without complication (HCC)    DVT (deep venous thrombosis) (HCC) 03/2021   left arm   GERD (gastroesophageal reflux disease)    High blood pressure    History of hiatal hernia    Left breast mass    Pre-diabetes    Past Surgical History:  Procedure Laterality Date   BIOPSY  02/05/2022   Procedure: BIOPSY;  Surgeon: Lanelle Bal, DO;  [facility];  Service: Endoscopy;;   COLONOSCOPY  2022   Megan Straughan: 7 polyps removed, has to return in one year   COLONOSCOPY  09/2021   Megan Straughan: 12 polyps removed   ESOPHAGOGASTRODUODENOSCOPY (EGD) WITH PROPOFOL N/A 02/05/2022   Procedure: ESOPHAGOGASTRODUODENOSCOPY (EGD) WITH PROPOFOL;  Surgeon: Lanelle Bal, DO;  [facility]: [facility];  Service: Endoscopy;  Laterality: N/A;  2:00pm   KNEE ARTHROSCOPY Right 10/28/2022   Procedure: RIGHT KNEE ARTHROSCOPY, DEBRIDEMENT;  Surgeon: Cammy Copa, MD;  Location: [facility];  Service: Orthopedics;  Laterality: Right;   left shoulder arthroscopy  12/2020   SHOULDER ARTHROSCOPY WITH OPEN ROTATOR CUFF REPAIR AND DISTAL CLAVICLE ACROMINECTOMY Left 03/21/2022    Procedure: LEFT SHOULDER ARTHOSCOPY, DEBRIDEMENT,  MANIPULATION, ROTATOR CUFF REPAIR, ARTHROSCOPIC DISTAL CLAVICLE EXCISION;  Surgeon: Cammy Copa, MD;  Location: [facility];  Service: Orthopedics;  Laterality: Left;   TUBAL LIGATION     Patient Active Problem List   Diagnosis Date Noted   Palpitations 03/25/2023   Nicotine abuse 03/25/2023   Non-cardiac chest pain 03/25/2023   DOE (dyspnea on exertion) 03/25/2023   Acute lateral meniscus tear of right knee 11/24/2022   Arthritis of left acromioclavicular joint    Complete tear of left rotator cuff    Abdominal pain, epigastric 01/23/2022   Iron deficiency anemia 12/13/2021   Iron deficiency anemia due to chronic blood loss 12/13/2021   GERD (gastroesophageal reflux disease)    Cubital tunnel syndrome of both upper extremities 01/25/2020   High blood pressure    Depression    Carpal tunnel syndrome    Anxiety     PCP: Donetta Potts, MDPCP - General   REFERRING PROVIDER: Julieanne Cotton, PA-C   REFERRING DIAG: [phone] (ICD-10-CM) - Chronic pain of right knee   Rationale for Evaluation and Treatment: Rehabilitation  THERAPY DIAG:  Difficulty in walking, not elsewhere classified  Right knee pain, unspecified chronicity  Muscle weakness (generalized)  ONSET DATE: October 2023 --------------------------------------------------------------------------------------------- SUBJECTIVE:  SUBJECTIVE STATEMENT: Patient had a fall on the right knee October 2023. XR showed no fracture. Patient had MRI Jan 2024 to show right knee with "Complex degenerative tearing of the anterior horn and body of the lateral meniscus with substance loss of the anterior horn." Patient underwent right knee arthroscopy March 2024. Patient chose not to do  Rehab post-op. Since then, patient states the right knee feels like popping, stiff, weakness, and buckling. Patient saw MD in November 2024; Md referred to OPPT.  PERTINENT HISTORY:  Left RTC Repair 2024 Jan 2023 Acute tear of lateral meniscus- right    PAIN:  Are you having pain? Yes: NPRS scale: 3-4/10 Pain location: diffuse around right knee  Pain description: achy, sharp Aggravating factors: turning on the knee, walking on uneven surface Relieving factors: rest  PRECAUTIONS: None  RED FLAGS: None   WEIGHT BEARING RESTRICTIONS: No  FALLS:  Has patient fallen in last 6 months? No  LIVING ENVIRONMENT: Lives with: lives with their family Lives in: House/apartment Stairs: Yes: External: patient does not need to use stairs to get in  steps; none Has following equipment at home: Single point cane How many minutes per week do you regularly exercise? ; patient just walks around home/stores  OCCUPATION: On disability; is submitting request to work part-time on disability   PLOF: Independent  PATIENT GOALS: To walk pain free and attempt working part time   NEXT MD VISIT: TBD --------------------------------------------------------------------------------------------- OBJECTIVE:   DIAGNOSTIC FINDINGS:  08/30/2022 IMPRESSION: Complex degenerative tearing of the anterior horn and body of the lateral meniscus with substance loss of the anterior horn.  PATIENT SURVEYS:  Lower Extremity Functional Score: 27 / 80 = 33.8 %    SCREENING FOR RED FLAGS: Bowel or bladder incontinence: No Spinal tumors: No Cauda equina syndrome: No Compression fracture: No Abdominal aneurysm: No  COGNITION: Overall cognitive status: Within functional limits for tasks assessed  POSTURE: No Significant postural limitations      FUNCTIONAL TESTS:  5 times sit to stand: 25s with moderate UE push from the chair  20-29 years: 6.0  1.4 seconds 30-39 years: 6.1  1.4 seconds 40-49 years:  7.6  1.8 seconds 50-59 years: 7.7  2.6 seconds 60-69 years: 8.4  0.0 seconds (female), 12.7  1.8 seconds (female) 70-79 years: 11.6  3.4 seconds (female), 13.0  4.8 seconds (female) 80-89 years: 16.7  4.5 seconds (female), 17.2  5.5 seconds (female) 90+ years: 19.5  2.3 seconds (female), 22.9  9.6 seconds (female)  The Minimal Clinically Important Difference (MCID) is 2.3 seconds.  A score of 16 seconds or less indicates that the participant is not likely to fall.  A score of more than 16 seconds indicates a higher risk of falls  As per American Physical Therapy Association (APTA) website   GAIT ANALYSIS: Distance walked: 49feet  Assistive device utilized: None Level of assistance: Complete Independence Comments: moderate antalgia on right knee; right knee maintained in flexion   SENSATION: WFL    LOWER EXTREMITY MMT:    MMT Right eval Left eval  Hip flexion 3+/5 4-/5  Hip extension    Hip abduction    Hip adduction    Hip internal rotation    Hip external rotation    Knee flexion 3-/5 4-/5  Knee extension 3-/5 4-/5  Ankle dorsiflexion 3+/5 4-/5  Ankle plantarflexion    Ankle inversion    Ankle eversion     (Blank rows = not tested)  LOWER EXTREMITY ROM:  Passive  Right eval  Hip flexion   Hip extension   Hip abduction   Hip adduction   Hip internal rotation   Hip external rotation   Knee flexion 0-85 pain  Knee extension (6)  Ankle dorsiflexion   Ankle plantarflexion   Ankle inversion   Ankle eversion    (Blank rows = not tested)  LOWER EXTREMITY SPECIAL TESTS   Patellafemoral grind test: positive right    PALPATION: Moderate tenderness to palpation right patella, inferior patella  --------------------------------------------------------------------------------------------- TODAY'S TREATMENT:                                                                                                                              DATE:   07/16/23 PT  Initial Eval Supine  PROM of right knee  Quad sets 10x5"H   PATIENT EDUCATION:  Education details: activity modification, pain management  Person educated: Patient Education method: Explanation Education comprehension: verbalized understanding  HOME EXERCISE PROGRAM: TBD --------------------------------------------------------------------------------------------- ASSESSMENT:  CLINICAL IMPRESSION: Patient is a 52 y.o. y.o. female who was seen today for physical therapy evaluation and treatment for difficulty walking, muscle weakness due to right knee pain . Patient presents to PT with the following objective impairments: Abnormal gait, decreased activity tolerance, decreased endurance, difficulty walking, decreased ROM, decreased strength, improper body mechanics, and pain. These impairments limit the patient in activities such as carrying, lifting, bending, standing, squatting, stairs, locomotion level, and caring for others. These impairments also limit the patient in participation such as meal prep, cleaning, laundry, interpersonal relationship, driving, shopping, community activity, and yard work. The patient will benefit from PT to address the limitations/impairments listed below to return to their prior level of function in the domains of activity and participation.    PERSONAL FACTORS: Time since onset of injury/illness/exacerbation are also affecting patient's functional outcome.   REHAB POTENTIAL: Good  CLINICAL DECISION MAKING: Stable/uncomplicated  EVALUATION COMPLEXITY: Low  --------------------------------------------------------------------------------------------- GOALS: Goals reviewed with patient? No  SHORT TERM GOALS: Target date: 08/06/2023     1.  Patient will complete the 5 times sit to stand:    within 22s to demonstrate an improvement in ADL completion, stair negotiation, household/community ambulation, and self-care Baseline: 25 Goal status:  INITIAL  2. Patient will be independent with a basic stretching/strengthening HEP  Baseline:  Goal status: INITIAL   LONG TERM GOALS: Target date: 08/27/2023    Patient will score a >/= 40/80 on the LEFS  to demonstrate a Minimally Clinically Importance Difference (MCID) in ADL completion, home/community ambulation, and lifting/bending/squatting.  Baseline:  Goal status: INITIAL  2. Patient will complete the 5 times sit to stand:  within 19s to demonstrate an improvement in ADL completion, stair negotiation, household/community ambulation, and self-care Baseline: 25 Goal status: INITIAL  3.  Patient will be independent with a comprehensive strengthening HEP  Baseline:  Goal status: INITIAL  4. Patient will be able to demonstrate right knee extension active range of motion to  WFL degrees to facilitate ADL completion, lifting, squatting. Baseline: (6) Goal status: INITIAL  --------------------------------------------------------------------------------------------- PLAN:  PT FREQUENCY: 1-2x/week  PT DURATION: 6 weeks  PLANNED INTERVENTIONS: 97110-Therapeutic exercises, 97530- Therapeutic activity, 97112- Neuromuscular re-education, 97535- Self Care, [id number]- Manual therapy, [phone]- Gait training, Patient/Family education, Balance training, Stair training, Taping, Dry Needling, Joint mobilization, Joint manipulation, Spinal manipulation, Spinal mobilization, DME instructions, Cryotherapy, and Moist heat.  PLAN FOR NEXT SESSION: HEP; continue to progress lower extremity strength    Seymour Bars, PT 07/16/2023, 3:22 PM

## 2023-07-21 ENCOUNTER — Ambulatory Visit (HOSPITAL_COMMUNITY): Payer: 59 | Admitting: Physical Therapy

## 2023-07-21 DIAGNOSIS — M25561 Pain in right knee: Secondary | ICD-10-CM

## 2023-07-21 DIAGNOSIS — R262 Difficulty in walking, not elsewhere classified: Secondary | ICD-10-CM | POA: Diagnosis not present

## 2023-07-21 DIAGNOSIS — M6281 Muscle weakness (generalized): Secondary | ICD-10-CM

## 2023-07-21 NOTE — Therapy (Signed)
[facility] OUT[facility]IENT PHYS[facility]ERAPY TREATMENT   Patient Name: Suzanne Lawrence MRN: [id number] DOB:[birth year], 52 y.o., female Today's Date: 07/21/2023  END OF SESSION:   PT End of Session - 07/21/23 1635     Visit Number 2    Number of Visits 12    Authorization Type U[employer] Dual    Authorization Time Period no auth    Progress Note Due on Visit 10    PT Start Time 1520    PT Stop Time 1600    PT Time Calculation (min) 40 min    Activity Tolerance Patient tolerated treatment well    Behavior During Therapy WFL for tasks assessed/performed               Past Medical History:  Diagnosis Date   Abnormal Pap smear of cervix    Anemia    issue with red blood cells being small   Anxiety    Arthritis    Carpal tunnel syndrome    right   Depression    Diabetes mellitus without complication (HCC)    DVT (deep venous thrombosis) (HCC) 03/2021   left arm   GERD (gastroesophageal reflux disease)    High blood pressure    History of hiatal hernia    Left breast mass    Pre-diabetes    Past Surgical History:  Procedure Laterality Date   BIOPSY  02/05/2022   Procedure: BIOPSY;  Surgeon: Lanelle Bal, DO;  Location: [facility];  Service: Endoscopy;;   COLONOSCOPY  2022   Megan Straughan: 7 polyps removed, has to return in one year   COLONOSCOPY  09/2021   Megan Straughan: 12 polyps removed   ESOPHAGOGASTRODUODENOSCOPY (EGD) WITH PROPOFOL N/A 02/05/2022   Procedure: ESOPHAGOGASTRODUODENOSCOPY (EGD) WITH PROPOFOL;  Surgeon: Lanelle Bal, DO;  Location: [facility];  Service: Endoscopy;  Laterality: N/A;  2:00pm   KNEE ARTHROSCOPY Right 10/28/2022   Procedure: RIGHT KNEE ARTHROSCOPY, DEBRIDEMENT;  Surgeon: Cammy Copa, MD;  Location: [facility];  Service: Orthopedics;  Laterality: Right;   left shoulder arthroscopy  12/2020   SHOULDER ARTHROSCOPY WITH OPEN ROTATOR CUFF REPAIR AND DISTAL CLAVICLE ACROMINECTOMY Left 03/21/2022   Procedure: LEFT  SHOULDER ARTHOSCOPY, DEBRIDEMENT,  MANIPULATION, ROTATOR CUFF REPAIR, ARTHROSCOPIC DISTAL CLAVICLE EXCISION;  Surgeon: Cammy Copa, MD;  Location: [facility];  Service: Orthopedics;  Laterality: Left;   TUBAL LIGATION     Patient Active Problem List   Diagnosis Date Noted   Palpitations 03/25/2023   Nicotine abuse 03/25/2023   Non-cardiac chest pain 03/25/2023   DOE (dyspnea on exertion) 03/25/2023   Acute lateral meniscus tear of right knee 11/24/2022   Arthritis of left acromioclavicular joint    Complete tear of left rotator cuff    Abdominal pain, epigastric 01/23/2022   Iron deficiency anemia 12/13/2021   Iron deficiency anemia due to chronic blood loss 12/13/2021   GERD (gastroesophageal reflux disease)    Cubital tunnel syndrome of both upper extremities 01/25/2020   High blood pressure    Depression    Carpal tunnel syndrome    Anxiety     PCP: Donetta Potts, MDPCP - General   REFERRING PROVIDER: Julieanne Cotton, PA-C   REFERRING DIAG: [phone] (ICD-10-CM) - Chronic pain of right knee   Rationale for Evaluation and Treatment: Rehabilitation  THERAPY DIAG:  No diagnosis found.  ONSET DATE: October 2023 --------------------------------------------------------------------------------------------- SUBJECTIVE:  SUBJECTIVE STATEMENT: 07/20/23:  pt reports her Rt knee is "irritated because of the popping" and rates at 2-3/10 in pain today. States she did not get any exercises to work on at her evaluation.   Evaluation: Patient had a fall on the right knee October 2023. XR showed no fracture. Patient had MRI Jan 2024 to show right knee with "Complex degenerative tearing of the anterior horn and body of the lateral meniscus with substance loss of the anterior horn." Patient  underwent right knee arthroscopy March 2024. Patient chose not to do Rehab post-op. Since then, patient states the right knee feels like popping, stiff, weakness, and buckling. Patient saw MD in November 2024; Md referred to OPPT.  PERTINENT HISTORY:  Left RTC Repair 2024 Jan 2023 Acute tear of lateral meniscus- right    PAIN:  Are you having pain? Yes: NPRS scale: 3-4/10 Pain location: diffuse around right knee  Pain description: achy, sharp Aggravating factors: turning on the knee, walking on uneven surface Relieving factors: rest  PRECAUTIONS: None  RED FLAGS: None   WEIGHT BEARING RESTRICTIONS: No  FALLS:  Has patient fallen in last 6 months? No  LIVING ENVIRONMENT: Lives with: lives with their family Lives in: House/apartment Stairs: Yes: External: patient does not need to use stairs to get in  steps; none Has following equipment at home: Single point cane How many minutes per week do you regularly exercise? ; patient just walks around home/stores  OCCUPATION: On disability; is submitting request to work part-time on disability   PLOF: Independent  PATIENT GOALS: To walk pain free and attempt working part time   NEXT MD VISIT: TBD --------------------------------------------------------------------------------------------- OBJECTIVE:   DIAGNOSTIC FINDINGS:  08/30/2022 IMPRESSION: Complex degenerative tearing of the anterior horn and body of the lateral meniscus with substance loss of the anterior horn.  PATIENT SURVEYS:  Lower Extremity Functional Score: 27 / 80 = 33.8 %    SCREENING FOR RED FLAGS: Bowel or bladder incontinence: No Spinal tumors: No Cauda equina syndrome: No Compression fracture: No Abdominal aneurysm: No  COGNITION: Overall cognitive status: Within functional limits for tasks assessed  POSTURE: No Significant postural limitations      FUNCTIONAL TESTS:  5 times sit to stand: 25s with moderate UE push from the chair  20-29  years: 6.0  1.4 seconds 30-39 years: 6.1  1.4 seconds 40-49 years: 7.6  1.8 seconds 50-59 years: 7.7  2.6 seconds 60-69 years: 8.4  0.0 seconds (female), 12.7  1.8 seconds (female) 70-79 years: 11.6  3.4 seconds (female), 13.0  4.8 seconds (female) 80-89 years: 16.7  4.5 seconds (female), 17.2  5.5 seconds (female) 90+ years: 19.5  2.3 seconds (female), 22.9  9.6 seconds (female)  The Minimal Clinically Important Difference (MCID) is 2.3 seconds.  A score of 16 seconds or less indicates that the participant is not likely to fall.  A score of more than 16 seconds indicates a higher risk of falls  As per American Physical Therapy Association (APTA) website   GAIT ANALYSIS: Distance walked: 59feet  Assistive device utilized: None Level of assistance: Complete Independence Comments: moderate antalgia on right knee; right knee maintained in flexion   SENSATION: WFL    LOWER EXTREMITY MMT:    MMT Right eval Left eval  Hip flexion 3+/5 4-/5  Hip extension    Hip abduction    Hip adduction    Hip internal rotation    Hip external rotation    Knee flexion 3-/5 4-/5  Knee extension 3-/5  4-/5  Ankle dorsiflexion 3+/5 4-/5  Ankle plantarflexion    Ankle inversion    Ankle eversion     (Blank rows = not tested)  LOWER EXTREMITY ROM:     Passive  Right eval  Hip flexion   Hip extension   Hip abduction   Hip adduction   Hip internal rotation   Hip external rotation   Knee flexion 0-85 pain  Knee extension (6)  Ankle dorsiflexion   Ankle plantarflexion   Ankle inversion   Ankle eversion    (Blank rows = not tested)  LOWER EXTREMITY SPECIAL TESTS   Patellafemoral grind test: positive right    PALPATION: Moderate tenderness to palpation right patella, inferior patella  --------------------------------------------------------------------------------------------- TODAY'S TREATMENT:                                                                                                                               DATE:  07/21/23 Goal review; establish HEP Bike seat 7 initially rocking then full revolutions 5 minutes Standing: with bil UE assist   Heel raise/toe raise 10X each knee flexion stretch 8" box 10X10"   Knee flexion Rt 10X  Hip abduction bil 10X each  Hip extension 10X each  Forward lunges 10X each onto 4" step no UE  Alternating march 10X each Supine:  heelslides Rt 10X  SLR Rt 10X with quad set (slight ext lag)  AROM Rt knee -3 to 100 degrees  07/16/23 PT Initial Eval Supine  PROM of right knee  Quad sets 10x5"H   PATIENT EDUCATION:  Education details: activity modification, pain management  Person educated: Patient Education method: Explanation Education comprehension: verbalized understanding  HOME EXERCISE PROGRAM:  -----------Access Code: [email or web address] URL: [email or web address] Date: 07/21/2023 Prepared by: Emeline Gins  Exercises - Standing Heel Raise with Support  - 1 x daily - 10 reps - Standing Toe Raises at Chair  - 2 x daily - 10 reps - Standing Knee Flexion AROM  - 2 x daily - 10 reps - Standing Hip Extension AROM  - 2 x daily - 10 reps - Standing Hip Abduction  - 2 x daily - 10 reps - Standing Marching  - 2 x daily - 10 reps - Supine Heel Slide  - 2 x daily - 10 reps - Straight Leg Raise  - 2 x daily - 10 reps----------------------------------------------------------------------------------  ASSESSMENT:  CLINICAL IMPRESSION:  Reviewed goals and POC moving forward. Began on bike with initial rocking then full revolutions.  PT unable to achieve same amount of knee flexion when completing heelslides without AAROM.  Measured with overall improved in ROM to 100 degrees flexion today.  Began strengthening for hips and LE's as well as continued Rt knee flexion.  PT with extension lag completing SLR's.  No complaints or issues with any other exercises today.  HEP established and written copy given to patient  today.  PT encouraged to complete therex more often, especially knee flexion and  quad sets to help improve Rt knee function.  Pt will continue to benefit from skilled therapy to address her deficits.    PERSONAL FACTORS: Time since onset of injury/illness/exacerbation are also affecting patient's functional outcome.   REHAB POTENTIAL: Good  CLINICAL DECISION MAKING: Stable/uncomplicated  EVALUATION COMPLEXITY: Low  --------------------------------------------------------------------------------------------- GOALS: Goals reviewed with patient? No  SHORT TERM GOALS: Target date: 08/06/2023     1.  Patient will complete the 5 times sit to stand:    within 22s to demonstrate an improvement in ADL completion, stair negotiation, household/community ambulation, and self-care Baseline: 25 Goal status: IN PROGRESS  2. Patient will be independent with a basic stretching/strengthening HEP  Baseline:  Goal status: IN PROGRESS   LONG TERM GOALS: Target date: 08/27/2023    Patient will score a >/= 40/80 on the LEFS  to demonstrate a Minimally Clinically Importance Difference (MCID) in ADL completion, home/community ambulation, and lifting/bending/squatting.  Baseline:  Goal status:IN PROGRESS  2. Patient will complete the 5 times sit to stand:  within 19s to demonstrate an improvement in ADL completion, stair negotiation, household/community ambulation, and self-care Baseline: 25 Goal status: IN PROGRESS  3.  Patient will be independent with a comprehensive strengthening HEP  Baseline:  Goal status: IN PROGRESS  4. Patient will be able to demonstrate right knee extension active range of motion to [facility] degrees to facilitate ADL completion, lifting, squatting. Baseline: (6) Goal status: IN PROGRESS --------------------------------------------------------------------------------------------- PLAN:  PT FREQUENCY: 1-2x/week  PT DURATION: 6 weeks  PLANNED INTERVENTIONS:  97110-Therapeutic exercises, 97530- Therapeutic activity, 97112- Neuromuscular re-education, 97535- Self Care, 21308- Manual therapy, [phone]- Gait training, Patient/Family education, Balance training, Stair training, Taping, Dry Needling, Joint mobilization, Joint manipulation, Spinal manipulation, Spinal mobilization, DME instructions, Cryotherapy, and Moist heat.  PLAN FOR NEXT SESSION:  Follow up with HEP; continue to progress lower extremity strength.  Decrease seat distance to 5 or 6 next session.    Lurena Nida, PTA 07/21/2023, 4:35 PM

## 2023-07-29 ENCOUNTER — Ambulatory Visit (HOSPITAL_COMMUNITY): Payer: 59 | Attending: Surgical

## 2023-07-29 DIAGNOSIS — M25561 Pain in right knee: Secondary | ICD-10-CM | POA: Insufficient documentation

## 2023-07-29 DIAGNOSIS — R29898 Other symptoms and signs involving the musculoskeletal system: Secondary | ICD-10-CM | POA: Diagnosis present

## 2023-07-29 DIAGNOSIS — M6281 Muscle weakness (generalized): Secondary | ICD-10-CM | POA: Diagnosis present

## 2023-07-29 DIAGNOSIS — R262 Difficulty in walking, not elsewhere classified: Secondary | ICD-10-CM | POA: Insufficient documentation

## 2023-07-29 NOTE — Therapy (Signed)
[facility] OUTPATIENT PHYSICAL THERAPY TREATMENT   Patient Name: Suzanne Lawrence MRN: [id number] DOB:01/14/1971, 52 y.o., female Today's Date: 07/29/2023  END OF SESSION:   PT End of Session - 07/29/23 1523     Visit Number 3    Number of Visits 12    Authorization Type U[employer] Dual    Authorization Time Period no auth    Progress Note Due on Visit 10    PT Start Time 0320    PT Stop Time 0400    PT Time Calculation (min) 40 min    Activity Tolerance Patient tolerated treatment well    Behavior During Therapy WFL for tasks assessed/performed               Past Medical History:  Diagnosis Date   Abnormal Pap smear of cervix    Anemia    issue with red blood cells being small   Anxiety    Arthritis    Carpal tunnel syndrome    right   Depression    Diabetes mellitus without complication (HCC)    DVT (deep venous thrombosis) (HCC) 03/2021   left arm   GERD (gastroesophageal reflux disease)    High blood pressure    History of hiatal hernia    Left breast mass    Pre-diabetes    Past Surgical History:  Procedure Laterality Date   BIOPSY  02/05/2022   Procedure: BIOPSY;  Surgeon: Lanelle Bal, DO;  [facility]: [facility];  Service: Endoscopy;;   COLONOSCOPY  2022   Megan Straughan: 7 polyps removed, has to return in one year   COLONOSCOPY  09/2021   Megan Straughan: 12 polyps removed   ESOPHAGOGASTRODUODENOSCOPY (EGD) WITH PROPOFOL N/A 02/05/2022   Procedure: ESOPHAGOGASTRODUODENOSCOPY (EGD) WITH PROPOFOL;  Surgeon: Lanelle Bal, DO;  Location: [facility];  Service: Endoscopy;  Laterality: N/A;  2:00pm   KNEE ARTHROSCOPY Right 10/28/2022   Procedure: RIGHT KNEE ARTHROSCOPY, DEBRIDEMENT;  Surgeon: Cammy Copa, MD;  Location: [facility];  Service: Orthopedics;  Laterality: Right;   left shoulder arthroscopy  12/2020   SHOULDER ARTHROSCOPY WITH OPEN ROTATOR CUFF REPAIR AND DISTAL CLAVICLE ACROMINECTOMY Left 03/21/2022   Procedure: LEFT SHOULDER  ARTHOSCOPY, DEBRIDEMENT,  MANIPULATION, ROTATOR CUFF REPAIR, ARTHROSCOPIC DISTAL CLAVICLE EXCISION;  Surgeon: Cammy Copa, MD;  Location: [facility];  Service: Orthopedics;  Laterality: Left;   TUBAL LIGATION     Patient Active Problem List   Diagnosis Date Noted   Palpitations 03/25/2023   Nicotine abuse 03/25/2023   Non-cardiac chest pain 03/25/2023   DOE (dyspnea on exertion) 03/25/2023   Acute lateral meniscus tear of right knee 11/24/2022   Arthritis of left acromioclavicular joint    Complete tear of left rotator cuff    Abdominal pain, epigastric 01/23/2022   Iron deficiency anemia 12/13/2021   Iron deficiency anemia due to chronic blood loss 12/13/2021   GERD (gastroesophageal reflux disease)    Cubital tunnel syndrome of both upper extremities 01/25/2020   High blood pressure    Depression    Carpal tunnel syndrome    Anxiety     PCP: Donetta Potts, MDPCP - General   REFERRING PROVIDER: Julieanne Cotton, PA-C   REFERRING DIAG: [phone] (ICD-10-CM) - Chronic pain of right knee   Rationale for Evaluation and Treatment: Rehabilitation  THERAPY DIAG:  No diagnosis found.  ONSET DATE: October 2023 --------------------------------------------------------------------------------------------- SUBJECTIVE:  SUBJECTIVE STATEMENT: 07/20/23:  pt reports her Rt knee is achy today but no pain.    Evaluation: Patient had a fall on the right knee October 2023. XR showed no fracture. Patient had MRI Jan 2024 to show right knee with "Complex degenerative tearing of the anterior horn and body of the lateral meniscus with substance loss of the anterior horn." Patient underwent right knee arthroscopy March 2024. Patient chose not to do Rehab post-op. Since then, patient states the right  knee feels like popping, stiff, weakness, and buckling. Patient saw MD in November 2024; MD referred to OPPT.  PERTINENT HISTORY:  Left RTC Repair 2024 Jan 2023 Acute tear of lateral meniscus- right    PAIN:  Are you having pain? Yes: NPRS scale: 3-4/10 Pain location: diffuse around right knee  Pain description: achy, sharp Aggravating factors: turning on the knee, walking on uneven surface Relieving factors: rest  PRECAUTIONS: None  RED FLAGS: None   WEIGHT BEARING RESTRICTIONS: No  FALLS:  Has patient fallen in last 6 months? No  LIVING ENVIRONMENT: Lives with: lives with their family Lives in: House/apartment Stairs: Yes: External: patient does not need to use stairs to get in  steps; none Has following equipment at home: Single point cane How many minutes per week do you regularly exercise? 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  PATIENT SURVEYS:  Lower Extremity Functional Score: 27 / 80 = 33.8 %    SCREENING FOR RED FLAGS: Bowel or bladder incontinence: No Spinal tumors: No Cauda equina syndrome: No Compression fracture: No Abdominal aneurysm: No  COGNITION: Overall cognitive status: Within functional limits for tasks assessed  POSTURE: No Significant postural limitations      FUNCTIONAL TESTS:  5 times sit to stand: 25s with moderate UE push from the chair  20-29 years: 6.0  1.4 seconds 30-39 years: 6.1  1.4 seconds 40-49 years: 7.6  1.8 seconds 50-59 years: 7.7  2.6  seconds 60-69 years: 8.4  0.0 seconds (female), 12.7  1.8 seconds (female) 70-79 years: 11.6  3.4 seconds (female), 13.0  4.8 seconds (female) 80-89 years: 16.7  4.5 seconds (female), 17.2  5.5 seconds (female) 90+ years: 19.5  2.3 seconds (female), 22.9  9.6 seconds (female)  The Minimal Clinically Important Difference (MCID) is 2.3 seconds.  A score of 16 seconds or less indicates that the participant is not likely to fall.  A score of more than 16 seconds indicates a higher risk of falls  As per American Physical Therapy Association (APTA) website   GAIT ANALYSIS: Distance walked: 68feet  Assistive device utilized: None Level of assistance: Complete Independence Comments: moderate antalgia on right knee; right knee maintained in flexion   SENSATION: WFL    LOWER EXTREMITY MMT:    MMT Right eval Left eval  Hip flexion 3+/5 4-/5  Hip extension    Hip abduction    Hip adduction    Hip internal rotation    Hip external rotation    Knee flexion 3-/5 4-/5  Knee extension 3-/5 4-/5  Ankle dorsiflexion 3+/5 4-/5  Ankle plantarflexion    Ankle inversion    Ankle  eversion     (Blank rows = not tested)  LOWER EXTREMITY ROM:     Passive  Right eval  Hip flexion   Hip extension   Hip abduction   Hip adduction   Hip internal rotation   Hip external rotation   Knee flexion 0-85 pain  Knee extension (6)  Ankle dorsiflexion   Ankle plantarflexion   Ankle inversion   Ankle eversion    (Blank rows = not tested)  LOWER EXTREMITY SPECIAL TESTS   Patellafemoral grind test: positive right    PALPATION: Moderate tenderness to palpation right patella, inferior patella  --------------------------------------------------------------------------------------------- TODAY'S TREATMENT:                                                                                                                              DATE:   07/29/23 Supine  Manual therapy to left knee including:  knee extension mobilizations grade 2-3, prolonged stretch   SAQs with 2# 2x10  LAQs with 2# 2x10 Gait Training with single point cane, 2 pt step through      07/21/23 Goal review; establish HEP Bike seat 7 initially rocking then full revolutions 5 minutes Standing: with bil UE assist   Heel raise/toe raise 10X each knee flexion stretch 8" box 10X10"   Knee flexion Rt 10X  Hip abduction bil 10X each  Hip extension 10X each  Forward lunges 10X each onto 4" step no UE  Alternating march 10X each Supine:  heelslides Rt 10X  SLR Rt 10X with quad set (slight ext lag)  AROM Rt knee -3 to 100 degrees  07/16/23 PT Initial Eval Supine  PROM of right knee  Quad sets 10x5"H   PATIENT EDUCATION:  Education details: activity modification, pain management  Person educated: Patient Education method: Explanation Education comprehension: verbalized understanding  HOME EXERCISE PROGRAM:  1) Access Code: [email or web address] URL: [email or web address] Date: 07/21/2023 Prepared by: Emeline Gins  Exercises - Standing Heel Raise with Support  - 1 x daily - 10 reps - Standing Toe Raises at Chair  - 2 x daily - 10 reps - Standing Knee Flexion AROM  - 2 x daily - 10 reps - Standing Hip Extension AROM  - 2 x daily - 10 reps - Standing Hip Abduction  - 2 x daily - 10 reps - Standing Marching  - 2 x daily - 10 reps - Supine Heel Slide  - 2 x daily - 10 reps - Straight Leg Raise  - 2 x daily - 10 reps  2) Access Code: [email or web address] URL: [email or web address] Date: 07/29/2023 Prepared by: Seymour Bars  Exercises - Seated Passive Knee Extension with Weight  - 2-5 x daily - 2-5 minutes hold - Supine Heel Slide  - 2-5 x daily - 10 reps  Patient Education - Reciprocal Gait with Cane  ----------------------------------------------------------------------------------  ASSESSMENT:  CLINICAL IMPRESSION: Patient continues to lack 6 deg of knee extension on the right lower  extremity. PT began session  with manual therapy to improve knee extension followed by quad activation; pt tolerated well with minimal discomfort during Mt to improve knee extension ROM. PT then focused on Gait Training using single point cane using a 2 point step through, stand-by assist. Patient able to ambulate with less limping with the single point cane.  Pt will continue to benefit from skilled therapy to address her deficits.    PERSONAL FACTORS: Time since onset of injury/illness/exacerbation are also affecting patient's functional outcome.   REHAB POTENTIAL: Good  CLINICAL DECISION MAKING: Stable/uncomplicated  EVALUATION COMPLEXITY: Low  --------------------------------------------------------------------------------------------- GOALS: Goals reviewed with patient? No  SHORT TERM GOALS: Target date: 08/06/2023     1.  Patient will complete the 5 times sit to stand:    within 22s to demonstrate an improvement in ADL completion, stair negotiation, household/community ambulation, and self-care Baseline: 25 Goal status: IN PROGRESS  2. Patient will be independent with a basic stretching/strengthening HEP  Baseline:  Goal status: IN PROGRESS   LONG TERM GOALS: Target date: 08/27/2023    Patient will score a >/= 40/80 on the LEFS  to demonstrate a Minimally Clinically Importance Difference (MCID) in ADL completion, home/community ambulation, and lifting/bending/squatting.  Baseline:  Goal status:IN PROGRESS  2. Patient will complete the 5 times sit to stand:  within 19s to demonstrate an improvement in ADL completion, stair negotiation, household/community ambulation, and self-care Baseline: 25 Goal status: IN PROGRESS  3.  Patient will be independent with a comprehensive strengthening HEP  Baseline:  Goal status: IN PROGRESS  4. Patient will be able to demonstrate right knee extension active range of motion to [facility] degrees to facilitate ADL completion, lifting,  squatting. Baseline: (6) Goal status: IN PROGRESS --------------------------------------------------------------------------------------------- PLAN:  PT FREQUENCY: 1-2x/week  PT DURATION: 6 weeks  PLANNED INTERVENTIONS: 97110-Therapeutic exercises, 97530- Therapeutic activity, 97112- Neuromuscular re-education, 97535- Self Care, [id number]- Manual therapy, [phone]- Gait training, Patient/Family education, Balance training, Stair training, Taping, Dry Needling, Joint mobilization, Joint manipulation, Spinal manipulation, Spinal mobilization, DME instructions, Cryotherapy, and Moist heat.  PLAN FOR NEXT SESSION:  Follow up with HEP; continue to progress lower extremity strength.  Decrease seat distance to 5 or 6 next session.    Seymour Bars, PT 07/29/2023, 3:23 PM

## 2023-07-31 ENCOUNTER — Ambulatory Visit (HOSPITAL_COMMUNITY): Payer: 59

## 2023-07-31 DIAGNOSIS — M6281 Muscle weakness (generalized): Secondary | ICD-10-CM

## 2023-07-31 DIAGNOSIS — R262 Difficulty in walking, not elsewhere classified: Secondary | ICD-10-CM | POA: Diagnosis not present

## 2023-07-31 DIAGNOSIS — R29898 Other symptoms and signs involving the musculoskeletal system: Secondary | ICD-10-CM

## 2023-07-31 DIAGNOSIS — M25561 Pain in right knee: Secondary | ICD-10-CM

## 2023-07-31 NOTE — Therapy (Signed)
[facility] OUTPATIENT PHYSICAL THERAPY TREATMENT   Patient Name: Suzanne Lawrence MRN: [id number] DOB:Jun 29, 1971, 52 y.o., female Today's Date: 07/31/2023  END OF SESSION:   PT End of Session - 07/31/23 0855     Visit Number 4    Number of Visits 12    Authorization Type [employer] Dual    Authorization Time Period no auth    Progress Note Due on Visit 10    PT Start Time 0855    PT Stop Time 0925    PT Time Calculation (min) 30 min    Activity Tolerance Patient tolerated treatment well    Behavior During Therapy [facility] for tasks assessed/performed           *patient arrived 10 min late today    Past Medical History:  Diagnosis Date   Abnormal Pap smear of cervix    Anemia    issue with red blood cells being small   Anxiety    Arthritis    Carpal tunnel syndrome    right   Depression    Diabetes mellitus without complication (HCC)    DVT (deep venous thrombosis) (HCC) 03/2021   left arm   GERD (gastroesophageal reflux disease)    High blood pressure    History of hiatal hernia    Left breast mass    Pre-diabetes    Past Surgical History:  Procedure Laterality Date   BIOPSY  02/05/2022   Procedure: BIOPSY;  Surgeon: Lanelle Bal, DO;  Location: [facility];  Service: Endoscopy;;   COLONOSCOPY  2022   Megan Straughan: 7 polyps removed, has to return in one year   COLONOSCOPY  09/2021   Megan Straughan: 12 polyps removed   ESOPHAGOGASTRODUODENOSCOPY (EGD) WITH PROPOFOL N/A 02/05/2022   Procedure: ESOPHAGOGASTRODUODENOSCOPY (EGD) WITH PROPOFOL;  Surgeon: Lanelle Bal, DO;  Location: [facility];  Service: Endoscopy;  Laterality: N/A;  2:00pm   KNEE ARTHROSCOPY Right 10/28/2022   Procedure: RIGHT KNEE ARTHROSCOPY, DEBRIDEMENT;  Surgeon: Cammy Copa, MD;  Location: [facility];  Service: Orthopedics;  Laterality: Right;   left shoulder arthroscopy  12/2020   SHOULDER ARTHROSCOPY WITH OPEN ROTATOR CUFF REPAIR AND DISTAL CLAVICLE ACROMINECTOMY Left  03/21/2022   Procedure: LEFT SHOULDER ARTHOSCOPY, DEBRIDEMENT,  MANIPULATION, ROTATOR CUFF REPAIR, ARTHROSCOPIC DISTAL CLAVICLE EXCISION;  Surgeon: Cammy Copa, MD;  Location: [facility];  Service: Orthopedics;  Laterality: Left;   TUBAL LIGATION     Patient Active Problem List   Diagnosis Date Noted   Palpitations 03/25/2023   Nicotine abuse 03/25/2023   Non-cardiac chest pain 03/25/2023   DOE (dyspnea on exertion) 03/25/2023   Acute lateral meniscus tear of right knee 11/24/2022   Arthritis of left acromioclavicular joint    Complete tear of left rotator cuff    Abdominal pain, epigastric 01/23/2022   Iron deficiency anemia 12/13/2021   Iron deficiency anemia due to chronic blood loss 12/13/2021   GERD (gastroesophageal reflux disease)    Cubital tunnel syndrome of both upper extremities 01/25/2020   High blood pressure    Depression    Carpal tunnel syndrome    Anxiety     PCP: Donetta Potts, MDPCP - General   REFERRING PROVIDER: Julieanne Cotton, PA-C   REFERRING DIAG: [phone] (ICD-10-CM) - Chronic pain of right knee   Rationale for Evaluation and Treatment: Rehabilitation  THERAPY DIAG:  No diagnosis found.  ONSET DATE: October 2023 --------------------------------------------------------------------------------------------- SUBJECTIVE:  SUBJECTIVE STATEMENT: Today: Patient with 3/10 achy pain today in right knee     Evaluation: Patient had a fall on the right knee October 2023. XR showed no fracture. Patient had MRI Jan 2024 to show right knee with "Complex degenerative tearing of the anterior horn and body of the lateral meniscus with substance loss of the anterior horn." Patient underwent right knee arthroscopy March 2024. Patient chose not to do Rehab post-op. Since  then, patient states the right knee feels like popping, stiff, weakness, and buckling. Patient saw MD in November 2024; MD referred to OPPT.  PERTINENT HISTORY:  Left RTC Repair 2024 Jan 2023 Acute tear of lateral meniscus- right    PAIN:  Are you having pain? Yes: NPRS scale: 3-4/10 Pain location: diffuse around right knee  Pain description: achy, sharp Aggravating factors: turning on the knee, walking on uneven surface Relieving factors: rest  PRECAUTIONS: None  RED FLAGS: None   WEIGHT BEARING RESTRICTIONS: No  FALLS:  Has patient fallen in last 6 months? No  LIVING ENVIRONMENT: Lives with: lives with their family Lives in: House/apartment Stairs: Yes: External: patient does not need to use stairs to get in  steps; none Has following equipment at home: Single point cane How many minutes per week do you regularly exercise? 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  PATIENT SURVEYS:  Lower Extremity Functional Score: 27 / 80 = 33.8 %    SCREENING FOR RED FLAGS: Bowel or bladder incontinence: No Spinal tumors: No Cauda equina syndrome: No Compression fracture: No Abdominal aneurysm: No  COGNITION: Overall cognitive status: Within functional limits for tasks assessed  POSTURE: No Significant postural limitations      FUNCTIONAL TESTS:  5 times sit to stand: 25s with moderate UE push from the chair  20-29 years: 6.0  1.4 seconds 30-39 years: 6.1  1.4 seconds 40-49 years: 7.6  1.8  seconds 50-59 years: 7.7  2.6 seconds 60-69 years: 8.4  0.0 seconds (female), 12.7  1.8 seconds (female) 70-79 years: 11.6  3.4 seconds (female), 13.0  4.8 seconds (female) 80-89 years: 16.7  4.5 seconds (female), 17.2  5.5 seconds (female) 90+ years: 19.5  2.3 seconds (female), 22.9  9.6 seconds (female)  The Minimal Clinically Important Difference (MCID) is 2.3 seconds.  A score of 16 seconds or less indicates that the participant is not likely to fall.  A score of more than 16 seconds indicates a higher risk of falls  As per American [employer] Association (APTA) website   GAIT ANALYSIS: Distance walked: 48feet  Assistive device utilized: None Level of assistance: Complete Independence Comments: moderate antalgia on right knee; right knee maintained in flexion   SENSATION: WFL    LOWER EXTREMITY MMT:    MMT Right eval Left eval  Hip flexion 3+/5 4-/5  Hip extension    Hip abduction    Hip adduction    Hip internal rotation    Hip external rotation    Knee flexion 3-/5 4-/5  Knee extension 3-/5 4-/5  Ankle dorsiflexion 3+/5 4-/5  Ankle plantarflexion    Ankle inversion    Ankle eversion     (  Blank rows = not tested)  LOWER EXTREMITY ROM:     Passive  Right eval  Hip flexion   Hip extension   Hip abduction   Hip adduction   Hip internal rotation   Hip external rotation   Knee flexion 0-85 pain  Knee extension (6)  Ankle dorsiflexion   Ankle plantarflexion   Ankle inversion   Ankle eversion    (Blank rows = not tested)  LOWER EXTREMITY SPECIAL TESTS   Patellafemoral grind test: positive right    PALPATION: Moderate tenderness to palpation right patella, inferior patella  --------------------------------------------------------------------------------------------- TODAY'S TREATMENT:                                                                                                                              DATE:   07/31/23 Supine  Manual  therapy to left knee including: knee extension mobilizations grade 2-3, prolonged stretch   SAQs with 2# x10 Seated  Single leg press machine with 10# 2x10  Bike L3 cool-down   07/29/23 Supine  Manual therapy to left knee including: knee extension mobilizations grade 2-3, prolonged stretch   SAQs with 2# 2x10  LAQs with 2# 2x10 Gait Training with single point cane, 2 pt step through    07/21/23 Goal review; establish HEP Bike seat 7 initially rocking then full revolutions 5 minutes Standing: with bil UE assist   Heel raise/toe raise 10X each knee flexion stretch 8" box 10X10"   Knee flexion Rt 10X  Hip abduction bil 10X each  Hip extension 10X each  Forward lunges 10X each onto 4" step no UE  Alternating march 10X each Supine:  heelslides Rt 10X  SLR Rt 10X with quad set (slight ext lag)  AROM Rt knee -3 to 100 degrees  07/16/23 PT Initial Eval Supine  PROM of right knee  Quad sets 10x5"H   PATIENT EDUCATION:  Education details: activity modification, pain management  Person educated: Patient Education method: Explanation Education comprehension: verbalized understanding  HOME EXERCISE PROGRAM:  1) Access Code: [email or web address] URL: [email or web address] Date: 07/21/2023 Prepared by: Emeline Gins  Exercises - Standing Heel Raise with Support  - 1 x daily - 10 reps - Standing Toe Raises at Chair  - 2 x daily - 10 reps - Standing Knee Flexion AROM  - 2 x daily - 10 reps - Standing Hip Extension AROM  - 2 x daily - 10 reps - Standing Hip Abduction  - 2 x daily - 10 reps - Standing Marching  - 2 x daily - 10 reps - Supine Heel Slide  - 2 x daily - 10 reps - Straight Leg Raise  - 2 x daily - 10 reps  2) Access Code: [email or web address] URL: [email or web address] Date: 07/29/2023 Prepared by: Seymour Bars  Exercises - Seated Passive Knee Extension with Weight  - 2-5 x daily - 2-5 minutes hold - Supine Heel Slide  - 2-5 x daily - 10 reps  Patient  Education - Passenger transport manager  ----------------------------------------------------------------------------------  ASSESSMENT:  CLINICAL IMPRESSION: Patient with improved knee extension ROM today; only lacking 3-4 degrees PT began session with manual therapy to improve knee extension followed by quad activation; pt tolerated well with minimal discomfort. PT then focused on lower extremity strength.  Patient able to ambulate with less limping with the single point cane.  Pt will continue to benefit from skilled therapy to address her deficits.    PERSONAL FACTORS: Time since onset of injury/illness/exacerbation are also affecting patient's functional outcome.   REHAB POTENTIAL: Good  CLINICAL DECISION MAKING: Stable/uncomplicated  EVALUATION COMPLEXITY: Low  --------------------------------------------------------------------------------------------- GOALS: Goals reviewed with patient? No  SHORT TERM GOALS: Target date: 08/06/2023     1.  Patient will complete the 5 times sit to stand:    within 22s to demonstrate an improvement in ADL completion, stair negotiation, household/community ambulation, and self-care Baseline: 25 Goal status: IN PROGRESS  2. Patient will be independent with a basic stretching/strengthening HEP  Baseline:  Goal status: IN PROGRESS   LONG TERM GOALS: Target date: 08/27/2023    Patient will score a >/= 40/80 on the LEFS  to demonstrate a Minimally Clinically Importance Difference (MCID) in ADL completion, home/community ambulation, and lifting/bending/squatting.  Baseline:  Goal status:IN PROGRESS  2. Patient will complete the 5 times sit to stand:  within 19s to demonstrate an improvement in ADL completion, stair negotiation, household/community ambulation, and self-care Baseline: 25 Goal status: IN PROGRESS  3.  Patient will be independent with a comprehensive strengthening HEP  Baseline:  Goal status: IN PROGRESS  4. Patient will  be able to demonstrate right knee extension active range of motion to [facility] degrees to facilitate ADL completion, lifting, squatting. Baseline: (6) Goal status: IN PROGRESS --------------------------------------------------------------------------------------------- PLAN:  PT FREQUENCY: 1-2x/week  PT DURATION: 6 weeks  PLANNED INTERVENTIONS: 97110-Therapeutic exercises, 97530- Therapeutic activity, 97112- Neuromuscular re-education, 97535- Self Care, [id number]- Manual therapy, [phone]- Gait training, Patient/Family education, Balance training, Stair training, Taping, Dry Needling, Joint mobilization, Joint manipulation, Spinal manipulation, Spinal mobilization, DME instructions, Cryotherapy, and Moist heat.  PLAN FOR NEXT SESSION:  Follow up with HEP; continue to progress lower extremity strength.  Decrease seat distance to 5 or 6 next session.    Seymour Bars, PT 07/31/2023, 8:56 AM

## 2023-08-04 ENCOUNTER — Ambulatory Visit (HOSPITAL_COMMUNITY): Payer: 59

## 2023-08-04 DIAGNOSIS — R262 Difficulty in walking, not elsewhere classified: Secondary | ICD-10-CM | POA: Diagnosis not present

## 2023-08-04 DIAGNOSIS — R29898 Other symptoms and signs involving the musculoskeletal system: Secondary | ICD-10-CM

## 2023-08-04 DIAGNOSIS — M25561 Pain in right knee: Secondary | ICD-10-CM

## 2023-08-04 DIAGNOSIS — M6281 Muscle weakness (generalized): Secondary | ICD-10-CM

## 2023-08-04 NOTE — Therapy (Signed)
[facility] OUTPATIENT PHYSICAL THERAPY TREATMENT   Patient Name: Suzanne Lawrence MRN: [id number] DOB:11-Jan-1971, 52 y.o., female Today's Date: 08/04/2023  END OF SESSION:   PT End of Session - 08/04/23 1114     Visit Number 5    Number of Visits 12    Authorization Type [employer] Dual    Authorization Time Period no auth    Progress Note Due on Visit 10    PT Start Time 1100    PT Stop Time 1140    PT Time Calculation (min) 40 min    Activity Tolerance Patient tolerated treatment well    Behavior During Therapy WFL for tasks assessed/performed           *patient arrived 10 min late today    Past Medical History:  Diagnosis Date   Abnormal Pap smear of cervix    Anemia    issue with red blood cells being small   Anxiety    Arthritis    Carpal tunnel syndrome    right   Depression    Diabetes mellitus without complication (HCC)    DVT (deep venous thrombosis) (HCC) 03/2021   left arm   GERD (gastroesophageal reflux disease)    High blood pressure    History of hiatal hernia    Left breast mass    Pre-diabetes    Past Surgical History:  Procedure Laterality Date   BIOPSY  02/05/2022   Procedure: BIOPSY;  Surgeon: Lanelle Bal, DO;  Location: [facility];  Service: Endoscopy;;   COLONOSCOPY  2022   Megan Straughan: 7 polyps removed, has to return in one year   COLONOSCOPY  09/2021   Megan Straughan: 12 polyps removed   ESOPHAGOGASTRODUODENOSCOPY (EGD) WITH PROPOFOL N/A 02/05/2022   Procedure: ESOPHAGOGASTRODUODENOSCOPY (EGD) WITH PROPOFOL;  Surgeon: Lanelle Bal, DO;  Location: [facility];  Service: Endoscopy;  Laterality: N/A;  2:00pm   KNEE ARTHROSCOPY Right 10/28/2022   Procedure: RIGHT KNEE ARTHROSCOPY, DEBRIDEMENT;  Surgeon: Cammy Copa, MD;  Location: [facility];  Service: Orthopedics;  Laterality: Right;   left shoulder arthroscopy  12/2020   SHOULDER ARTHROSCOPY WITH OPEN ROTATOR CUFF REPAIR AND DISTAL CLAVICLE ACROMINECTOMY Left  03/21/2022   Procedure: LEFT SHOULDER ARTHOSCOPY, DEBRIDEMENT,  MANIPULATION, ROTATOR CUFF REPAIR, ARTHROSCOPIC DISTAL CLAVICLE EXCISION;  Surgeon: Cammy Copa, MD;  Location: [facility];  Service: Orthopedics;  Laterality: Left;   TUBAL LIGATION     Patient Active Problem List   Diagnosis Date Noted   Palpitations 03/25/2023   Nicotine abuse 03/25/2023   Non-cardiac chest pain 03/25/2023   DOE (dyspnea on exertion) 03/25/2023   Acute lateral meniscus tear of right knee 11/24/2022   Arthritis of left acromioclavicular joint    Complete tear of left rotator cuff    Abdominal pain, epigastric 01/23/2022   Iron deficiency anemia 12/13/2021   Iron deficiency anemia due to chronic blood loss 12/13/2021   GERD (gastroesophageal reflux disease)    Cubital tunnel syndrome of both upper extremities 01/25/2020   High blood pressure    Depression    Carpal tunnel syndrome    Anxiety     PCP: Donetta Potts, MDPCP - General   REFERRING PROVIDER: Julieanne Cotton, PA-C   REFERRING DIAG: [id number] (ICD-10-CM) - Chronic pain of right knee   Rationale for Evaluation and Treatment: Rehabilitation  THERAPY DIAG:  Difficulty in walking, not elsewhere classified  Right knee pain, unspecified chronicity  Muscle weakness (generalized)  Other symptoms and  signs involving the musculoskeletal system  Acute pain of left shoulder  ONSET DATE: October 2023 --------------------------------------------------------------------------------------------- SUBJECTIVE:                                                                                                                                                                                           SUBJECTIVE STATEMENT: Today: Patient with 4/10 right knee pain; twisted it over the weekend     Evaluation: Patient had a fall on the right knee October 2023. XR showed no fracture. Patient had MRI Jan 2024 to show right knee with "Complex  degenerative tearing of the anterior horn and body of the lateral meniscus with substance loss of the anterior horn." Patient underwent right knee arthroscopy March 2024. Patient chose not to do Rehab post-op. Since then, patient states the right knee feels like popping, stiff, weakness, and buckling. Patient saw MD in November 2024; MD referred to OPPT.  PERTINENT HISTORY:  Left RTC Repair 2024 Jan 2023 Acute tear of lateral meniscus- right    PAIN:  Are you having pain? Yes: NPRS scale: 3-4/10 Pain location: diffuse around right knee  Pain description: achy, sharp Aggravating factors: turning on the knee, walking on uneven surface Relieving factors: rest  PRECAUTIONS: None  RED FLAGS: None   WEIGHT BEARING RESTRICTIONS: No  FALLS:  Has patient fallen in last 6 months? No  LIVING ENVIRONMENT: Lives with: lives with their family Lives in: House/apartment Stairs: Yes: External: patient does not need to use stairs to get in  steps; none Has following equipment at home: Single point cane How many minutes per week do you regularly exercise? 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  PATIENT SURVEYS:  Lower Extremity Functional Score: 27 / 80 = 33.8 %    SCREENING FOR RED FLAGS: Bowel or bladder incontinence: No Spinal tumors: No Cauda equina syndrome: No Compression fracture: No Abdominal aneurysm: No  COGNITION: Overall cognitive status: Within functional limits for tasks assessed  POSTURE: No  Significant postural limitations      FUNCTIONAL TESTS:  5 times sit to stand: 25s with moderate UE push from the chair  20-29 years: 6.0  1.4 seconds 30-39 years: 6.1  1.4 seconds 40-49 years: 7.6  1.8 seconds 50-59 years: 7.7  2.6  seconds 60-69 years: 8.4  0.0 seconds (female), 12.7  1.8 seconds (female) 70-79 years: 11.6  3.4 seconds (female), 13.0  4.8 seconds (female) 80-89 years: 16.7  4.5 seconds (female), 17.2  5.5 seconds (female) 90+ years: 19.5  2.3 seconds (female), 22.9  9.6 seconds (female)  The Minimal Clinically Important Difference (MCID) is 2.3 seconds.  A score of 16 seconds or less indicates that the participant is not likely to fall.  A score of more than 16 seconds indicates a higher risk of falls  As per [employer] Association (APTA) website   GAIT ANALYSIS: Distance walked: 41feet  Assistive device utilized: None Level of assistance: Complete Independence Comments: moderate antalgia on right knee; right knee maintained in flexion   SENSATION: WFL    LOWER EXTREMITY MMT:    MMT Right eval Left eval  Hip flexion 3+/5 4-/5  Hip extension    Hip abduction    Hip adduction    Hip internal rotation    Hip external rotation    Knee flexion 3-/5 4-/5  Knee extension 3-/5 4-/5  Ankle dorsiflexion 3+/5 4-/5  Ankle plantarflexion    Ankle inversion    Ankle eversion     (Blank rows = not tested)  LOWER EXTREMITY ROM:     Passive  Right eval  Hip flexion   Hip extension   Hip abduction   Hip adduction   Hip internal rotation   Hip external rotation   Knee flexion 0-85 pain  Knee extension (6)  Ankle dorsiflexion   Ankle plantarflexion   Ankle inversion   Ankle eversion    (Blank rows = not tested)  LOWER EXTREMITY SPECIAL TESTS   Patellafemoral grind test: positive right    PALPATION: Moderate tenderness to palpation right patella, inferior  patella  --------------------------------------------------------------------------------------------- TODAY'S TREATMENT:                                                                                                                              DATE:   08/04/23 NuStep Warm Up L3 Supine  Manual therapy to left knee including: knee extension mobilizations grade 2-3, prolonged stretch, instrument-assisted soft tissue mobilization to patellar tendon   DKTC with Green physioball x10  Hip bridges with G physioball 2x10  HS bridges with G physioball 2x10   SAQs with 3#, #6 x10  07/31/23 Supine  Manual therapy to left knee including: knee extension mobilizations grade 2-3, prolonged stretch   SAQs with 2# x10 Seated  Single leg press machine with 10# 2x10  Bike L3 cool-down   07/29/23 Supine  Manual therapy to left knee including: knee extension mobilizations grade 2-3, prolonged stretch   SAQs with 2# 2x10  LAQs with 2# 2x10 Gait Training with single point cane, 2 pt step through    07/21/23 Goal review; establish HEP Bike seat 7 initially rocking then full revolutions 5 minutes Standing: with bil UE assist   Heel raise/toe raise 10X  each knee flexion stretch 8" box 10X10"   Knee flexion Rt 10X  Hip abduction bil 10X each  Hip extension 10X each  Forward lunges 10X each onto 4" step no UE  Alternating march 10X each Supine:  heelslides Rt 10X  SLR Rt 10X with quad set (slight ext lag)  AROM Rt knee -3 to 100 degrees  07/16/23 PT Initial Eval Supine  PROM of right knee  Quad sets 10x5"H   PATIENT EDUCATION:  Education details: activity modification, pain management  Person educated: Patient Education method: Explanation Education comprehension: verbalized understanding  HOME EXERCISE PROGRAM:  1) Access Code: [email or web address] URL: [email or web address] Date: 07/21/2023 Prepared by: Emeline Gins  Exercises - Standing Heel Raise with Support  - 1 x daily  - 10 reps - Standing Toe Raises at Chair  - 2 x daily - 10 reps - Standing Knee Flexion AROM  - 2 x daily - 10 reps - Standing Hip Extension AROM  - 2 x daily - 10 reps - Standing Hip Abduction  - 2 x daily - 10 reps - Standing Marching  - 2 x daily - 10 reps - Supine Heel Slide  - 2 x daily - 10 reps - Straight Leg Raise  - 2 x daily - 10 reps  2) Access Code: [email or web address] URL: [email or web address] Date: 07/29/2023 Prepared by: Seymour Bars  Exercises - Seated Passive Knee Extension with Weight  - 2-5 x daily - 2-5 minutes hold - Supine Heel Slide  - 2-5 x daily - 10 reps  Patient Education - Reciprocal Gait with Cane  ----------------------------------------------------------------------------------  ASSESSMENT:  CLINICAL IMPRESSION: Patient with improved knee extension ROM today; only lacking 2-3 degrees.  PT began session with manual therapy to improve knee extension followed by hamstring strengthening therapeutic exercise. Patient tolerated well with minimal discomfort. Moderate fatigue noted in hamstrings. Patient able to ambulate with less limping with the single point cane.  Pt will continue to benefit from skilled therapy to address her deficits.    PERSONAL FACTORS: Time since onset of injury/illness/exacerbation are also affecting patient's functional outcome.   REHAB POTENTIAL: Good  CLINICAL DECISION MAKING: Stable/uncomplicated  EVALUATION COMPLEXITY: Low  --------------------------------------------------------------------------------------------- GOALS: Goals reviewed with patient? No  SHORT TERM GOALS: Target date: 08/06/2023     1.  Patient will complete the 5 times sit to stand:    within 22s to demonstrate an improvement in ADL completion, stair negotiation, household/community ambulation, and self-care Baseline: 25 Goal status: IN PROGRESS  2. Patient will be independent with a basic stretching/strengthening HEP  Baseline:  Goal  status: IN PROGRESS   LONG TERM GOALS: Target date: 08/27/2023    Patient will score a >/= 40/80 on the LEFS  to demonstrate a Minimally Clinically Importance Difference (MCID) in ADL completion, home/community ambulation, and lifting/bending/squatting.  Baseline:  Goal status:IN PROGRESS  2. Patient will complete the 5 times sit to stand:  within 19s to demonstrate an improvement in ADL completion, stair negotiation, household/community ambulation, and self-care Baseline: 25 Goal status: IN PROGRESS  3.  Patient will be independent with a comprehensive strengthening HEP  Baseline:  Goal status: IN PROGRESS  4. Patient will be able to demonstrate right knee extension active range of motion to [facility] degrees to facilitate ADL completion, lifting, squatting. Baseline: (6) Goal status: IN PROGRESS --------------------------------------------------------------------------------------------- PLAN:  PT FREQUENCY: 1-2x/week  PT DURATION: 6 weeks  PLANNED INTERVENTIONS: 97110-Therapeutic exercises, 97530- Therapeutic activity, [id number]- Neuromuscular re-education, 97535- Self Care, [id number]- Manual therapy, [phone]- Gait training,  Patient/Family education, Balance training, Stair training, Taping, Dry Needling, Joint mobilization, Joint manipulation, Spinal manipulation, Spinal mobilization, DME instructions, Cryotherapy, and Moist heat.  PLAN FOR NEXT SESSION:  Follow up with HEP; continue to progress lower extremity strength.  Decrease seat distance to 5 or 6 next session.    Seymour Bars, PT 08/04/2023, 11:15 AM

## 2023-08-06 IMAGING — RF DG UGI W/ HIGH DENSITY W/O KUB
11 of 15 series · 15 of 24 positions shown · non-contrast
Comparison: None.

CLINICAL DATA: Acid reflux. Regurgitation. Evaluate for hiatal
hernia.

EXAM:
UPPER GI SERIES WITH KUB
TECHNIQUE: After obtaining a scout radiograph a routine upper GI series was
performed using thin and high density barium.
FLUOROSCOPY TIME:  Fluoroscopy Time:  4 minutes 42 seconds
Radiation Exposure Index (if provided by the fluoroscopic device):
92.8 mGy
Number of Acquired Spot Images: 0

[Series 1: t ap supine · 0.15mm/px · 1 of 1 slices shown]
[im 1/1]
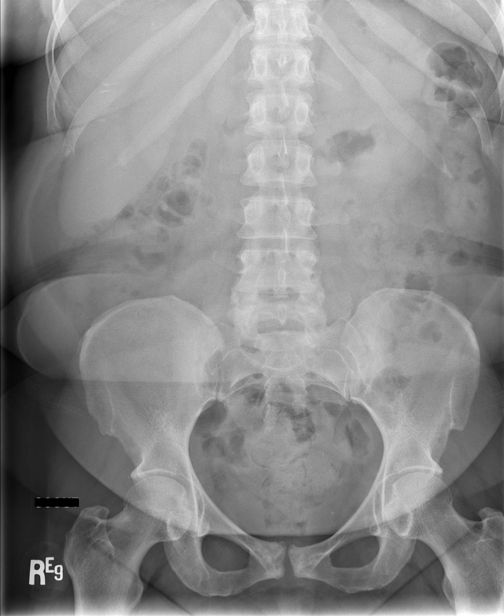

[Series 2: cp_standard · 0.26mm/px · 1 of 425 frames shown (1 of 10)]
[frame 114/425]
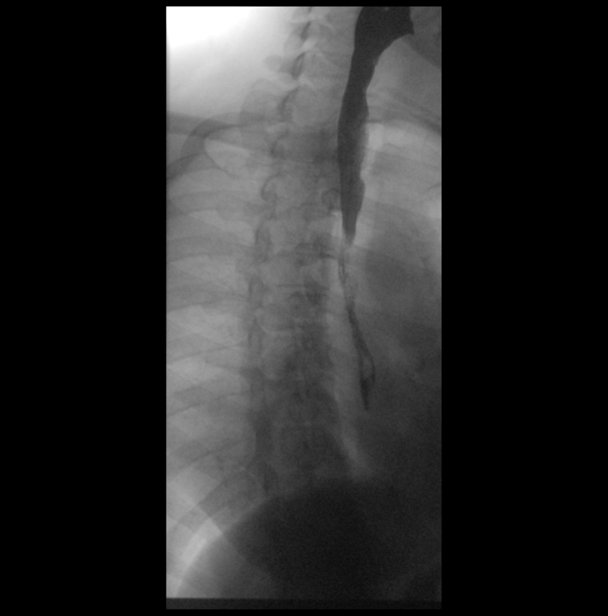

[Series 3: cp_standard · 0.30mm/px · 1 of 1 slices shown (2 of 10)]
[im 1/1]
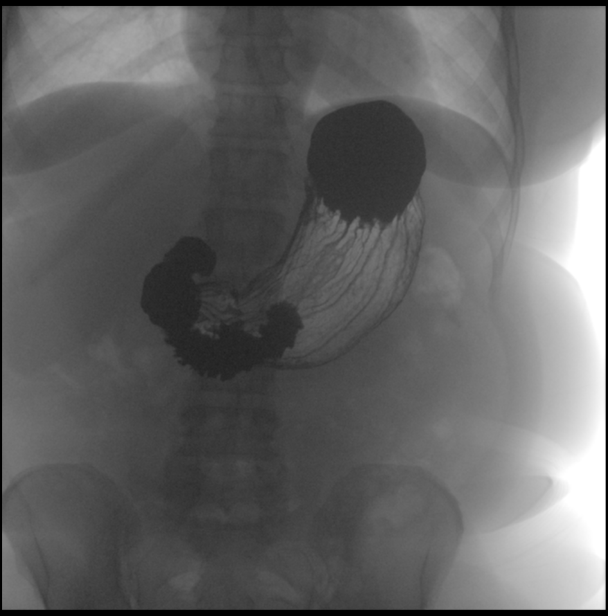

[Series 4: cp_standard · 0.19mm/px · 1 of 1 slices shown (3 of 10)]
[im 1/1]
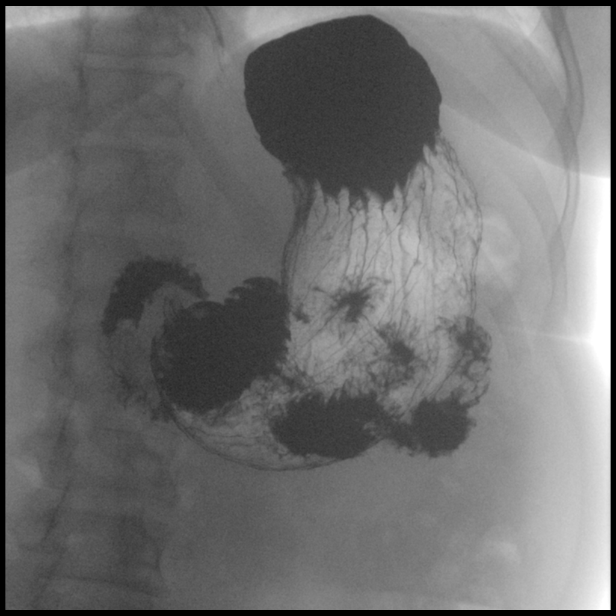

[Series 6: cp_standard · 0.28mm/px · 1 of 1 slices shown (4 of 10)]
[im 1/1]
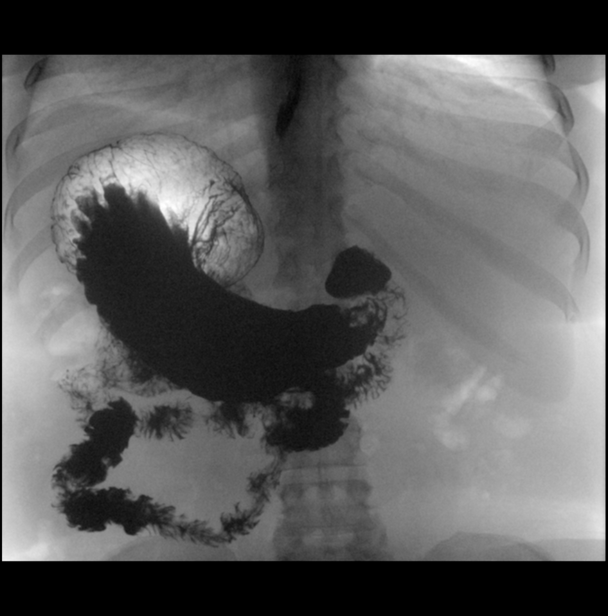

[Series 7: cp_standard · 0.19mm/px · 1 of 1 slices shown (5 of 10)]
[im 1/1]
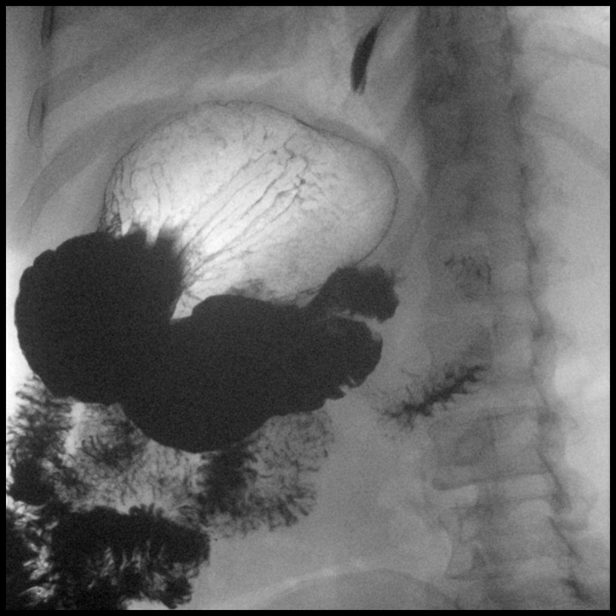

[Series 9: cp_standard · 0.19mm/px · 1 of 1 slices shown (6 of 10)]
[im 1/1]
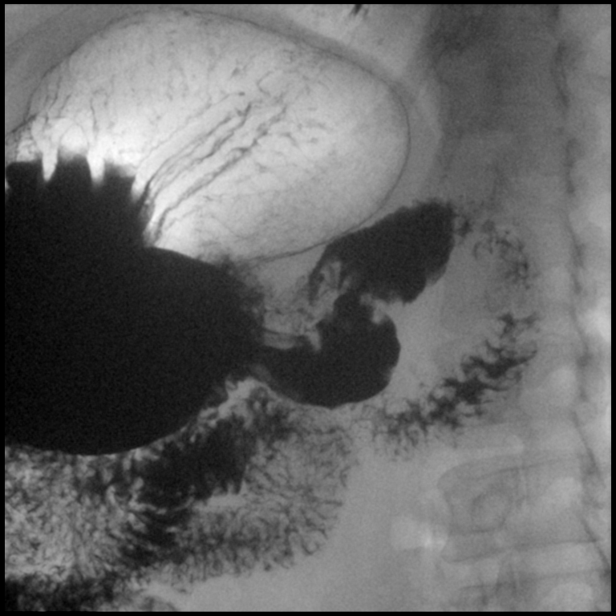

[Series 11: cp_standard · 0.30mm/px · 2 of 185 frames shown (7 of 10)]
[frame 93/185]
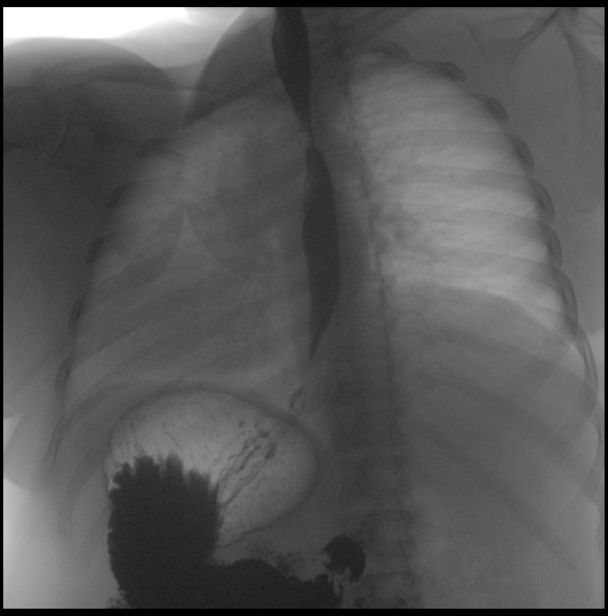
[frame 158/185]
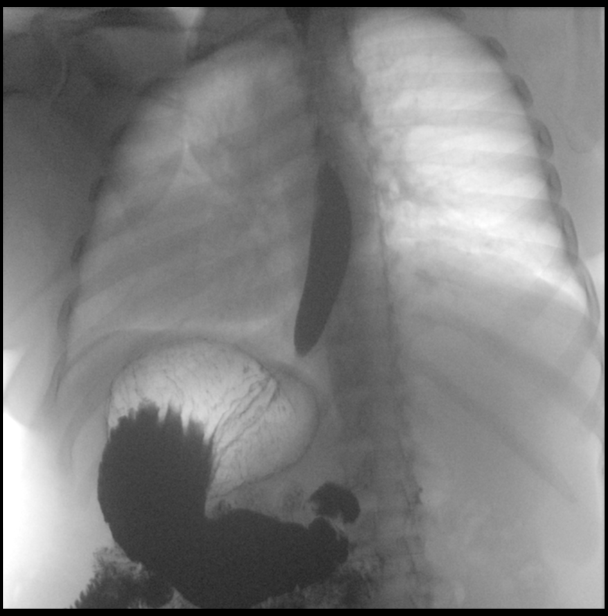

[Series 12: cp_standard · 0.30mm/px · 3 of 176 frames shown (8 of 10)]
[frame 4/176]
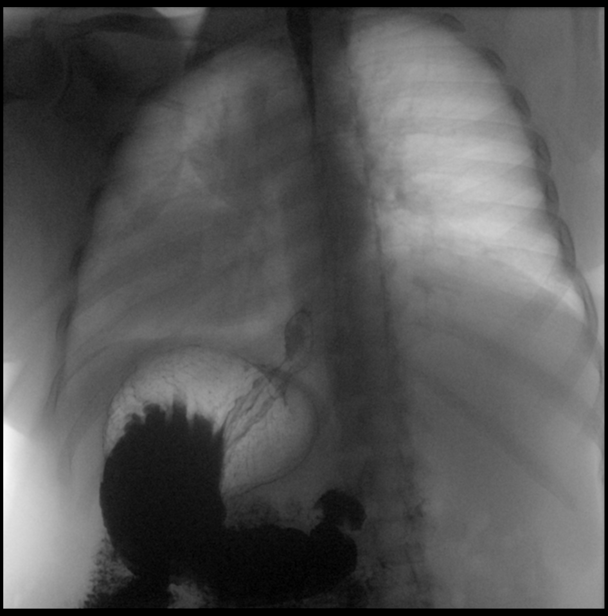
[frame 27/176]
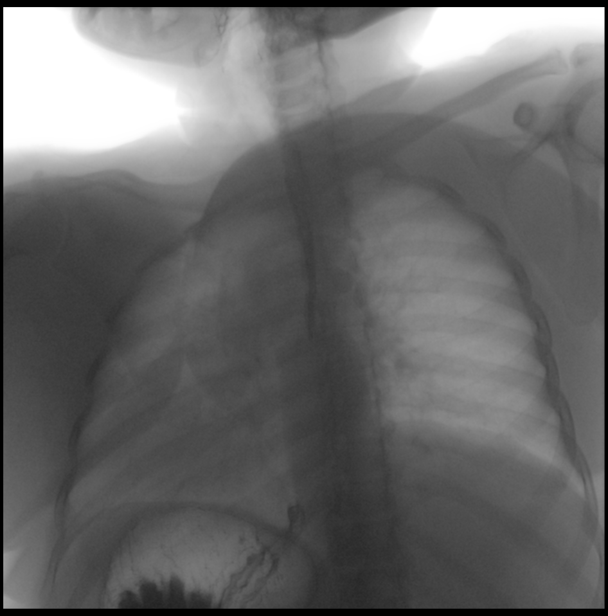
[frame 150/176]
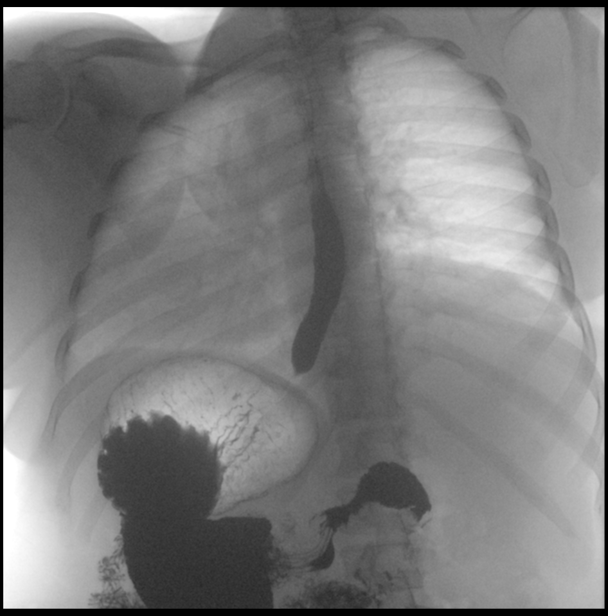

[Series 13: cp_standard · 0.30mm/px · 2 of 280 frames shown (9 of 10)]
[frame 238/280]
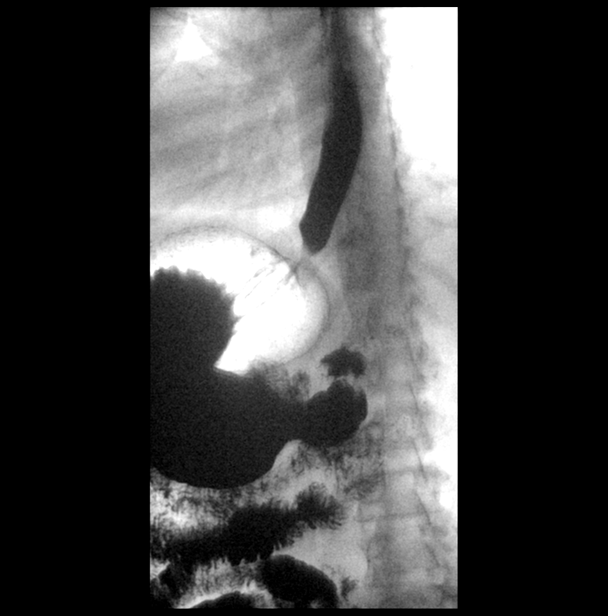
[frame 239/280]
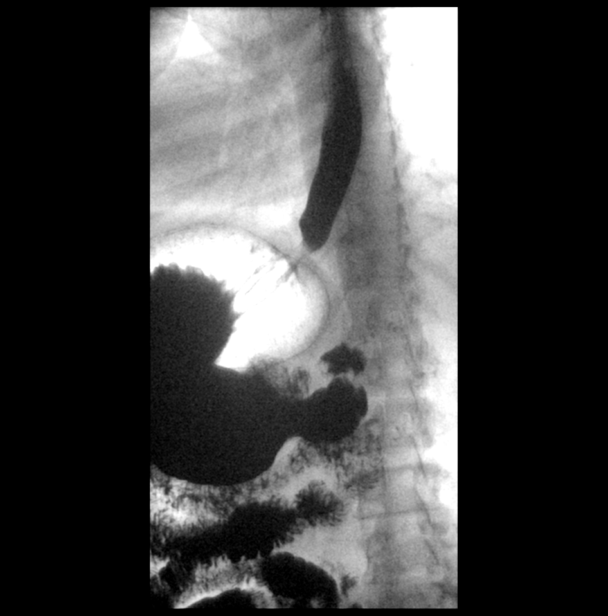

[Series 15: cp_standard · 0.30mm/px · 1 of 1 slices shown (10 of 10)]
[im 1/1]
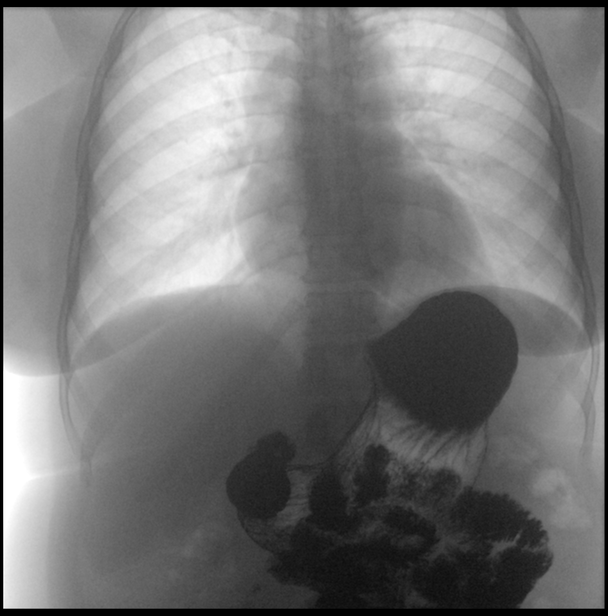

[15 of 24 positions shown; findings below may reference images not displayed]

FINDINGS: Preprocedure scout film is unremarkable.

Double contrast evaluation of the esophagus demonstrates no mucosal
abnormality.

Double contrast evaluation of the stomach demonstrates no mass,
ulcer, or inflammation. Normal duodenal bulb and C-loop.

Evaluation of primary peristalsis demonstrates a normal primary
peristaltic wave on each swallow.

Full column evaluation of the esophagus demonstrates no persistent
narrowing or stricture.

No spontaneous gastroesophageal reflux or reflux with water
swallows.
IMPRESSION: Normal upper GI.  No explanation for patient's symptoms.

## 2023-08-12 ENCOUNTER — Ambulatory Visit (HOSPITAL_COMMUNITY): Payer: 59

## 2023-08-12 DIAGNOSIS — R262 Difficulty in walking, not elsewhere classified: Secondary | ICD-10-CM

## 2023-08-12 DIAGNOSIS — M25561 Pain in right knee: Secondary | ICD-10-CM

## 2023-08-12 DIAGNOSIS — M6281 Muscle weakness (generalized): Secondary | ICD-10-CM

## 2023-08-12 NOTE — Therapy (Signed)
OUTPATIENT PHYSICAL THERAPY TREATMENT   Patient Name: Suzanne Lawrence MRN: [id number] DOB:1971-04-01, 52 y.o., female Today's Date: 08/12/2023  END OF SESSION:   PT End of Session - 08/12/23 0935     Visit Number 6    Number of Visits 12    Authorization Type [employer] Dual    Authorization Time Period no auth    Progress Note Due on Visit 10    PT Start Time 0933    PT Stop Time 1013    PT Time Calculation (min) 40 min    Activity Tolerance Patient tolerated treatment well    Behavior During Therapy WFL for tasks assessed/performed           *patient arrived 10 min late today    Past Medical History:  Diagnosis Date   Abnormal Pap smear of cervix    Anemia    issue with red blood cells being small   Anxiety    Arthritis    Carpal tunnel syndrome    right   Depression    Diabetes mellitus without complication (HCC)    DVT (deep venous thrombosis) (HCC) 03/2021   left arm   GERD (gastroesophageal reflux disease)    High blood pressure    History of hiatal hernia    Left breast mass    Pre-diabetes    Past Surgical History:  Procedure Laterality Date   BIOPSY  02/05/2022   Procedure: BIOPSY;  Surgeon: Lanelle Bal, DO;  Location: [facility];  Service: Endoscopy;;   COLONOSCOPY  2022   Megan Straughan: 7 polyps removed, has to return in one year   COLONOSCOPY  09/2021   Megan Straughan: 12 polyps removed   ESOPHAGOGASTRODUODENOSCOPY (EGD) WITH PROPOFOL N/A 02/05/2022   Procedure: ESOPHAGOGASTRODUODENOSCOPY (EGD) WITH PROPOFOL;  Surgeon: Lanelle Bal, DO;  Location: [facility];  Service: Endoscopy;  Laterality: N/A;  2:00pm   KNEE ARTHROSCOPY Right 10/28/2022   Procedure: RIGHT KNEE ARTHROSCOPY, DEBRIDEMENT;  Surgeon: Cammy Copa, MD;  Location: [facility];  Service: Orthopedics;  Laterality: Right;   left shoulder arthroscopy  12/2020   SHOULDER ARTHROSCOPY WITH OPEN ROTATOR CUFF REPAIR AND DISTAL CLAVICLE ACROMINECTOMY Left  03/21/2022   Procedure: LEFT SHOULDER ARTHOSCOPY, DEBRIDEMENT,  MANIPULATION, ROTATOR CUFF REPAIR, ARTHROSCOPIC DISTAL CLAVICLE EXCISION;  Surgeon: Cammy Copa, MD;  Location: [facility];  Service: Orthopedics;  Laterality: Left;   TUBAL LIGATION     Patient Active Problem List   Diagnosis Date Noted   Palpitations 03/25/2023   Nicotine abuse 03/25/2023   Non-cardiac chest pain 03/25/2023   DOE (dyspnea on exertion) 03/25/2023   Acute lateral meniscus tear of right knee 11/24/2022   Arthritis of left acromioclavicular joint    Complete tear of left rotator cuff    Abdominal pain, epigastric 01/23/2022   Iron deficiency anemia 12/13/2021   Iron deficiency anemia due to chronic blood loss 12/13/2021   GERD (gastroesophageal reflux disease)    Cubital tunnel syndrome of both upper extremities 01/25/2020   High blood pressure    Depression    Carpal tunnel syndrome    Anxiety     PCP: Donetta Potts, MDPCP - General   REFERRING PROVIDER: Julieanne Cotton, PA-C   REFERRING DIAG: [phone] (ICD-10-CM) - Chronic pain of right knee   Rationale for Evaluation and Treatment: Rehabilitation  THERAPY DIAG:  Difficulty in walking, not elsewhere classified  Right knee pain, unspecified chronicity  Muscle weakness (generalized)  ONSET DATE: October 2023 ---------------------------------------------------------------------------------------------  SUBJECTIVE:                                                                                                                                                                                           SUBJECTIVE STATEMENT: Today: Patient with 3/10 pain today; has been doing HEP     Evaluation: Patient had a fall on the right knee October 2023. XR showed no fracture. Patient had MRI Jan 2024 to show right knee with "Complex degenerative tearing of the anterior horn and body of the lateral meniscus with substance loss of the anterior  horn." Patient underwent right knee arthroscopy March 2024. Patient chose not to do Rehab post-op. Since then, patient states the right knee feels like popping, stiff, weakness, and buckling. Patient saw MD in November 2024; MD referred to OPPT.  PERTINENT HISTORY:  Left RTC Repair 2024 Jan 2023 Acute tear of lateral meniscus- right    PAIN:  Are you having pain? Yes: NPRS scale: 3-4/10 Pain location: diffuse around right knee  Pain description: achy, sharp Aggravating factors: turning on the knee, walking on uneven surface Relieving factors: rest  PRECAUTIONS: None  RED FLAGS: None   WEIGHT BEARING RESTRICTIONS: No  FALLS:  Has patient fallen in last 6 months? No  LIVING ENVIRONMENT: Lives with: lives with their family Lives in: House/apartment Stairs: Yes: External: patient does not need to use stairs to get in  steps; none Has following equipment at home: Single point cane How many minutes per week do you regularly exercise? 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  PATIENT SURVEYS:  Lower Extremity Functional Score: 27 / 80 = 33.8 %    SCREENING FOR RED FLAGS: Bowel or bladder incontinence: No Spinal tumors: No Cauda equina syndrome: No Compression fracture: No Abdominal aneurysm: No  COGNITION: Overall cognitive status: Within functional limits for tasks assessed  POSTURE: No Significant postural limitations      FUNCTIONAL TESTS:  5 times sit to stand: 25s with moderate UE push from the  chair  20-29 years: 6.0  1.4 seconds 30-39 years: 6.1  1.4 seconds 40-49 years: 7.6  1.8 seconds 50-59 years: 7.7  2.6 seconds 60-69 years: 8.4  0.0 seconds (female), 12.7  1.8 seconds (female) 70-79 years: 11.6  3.4 seconds (  female), 13.0  4.8 seconds (female) 80-89 years: 16.7  4.5 seconds (female), 17.2  5.5 seconds (female) 90+ years: 19.5  2.3 seconds (female), 22.9  9.6 seconds (female)  The Minimal Clinically Important Difference (MCID) is 2.3 seconds.  A score of 16 seconds or less indicates that the participant is not likely to fall.  A score of more than 16 seconds indicates a higher risk of falls  As per [employer] Association (APTA) website   GAIT ANALYSIS: Distance walked: 62feet  Assistive device utilized: None Level of assistance: Complete Independence Comments: moderate antalgia on right knee; right knee maintained in flexion   SENSATION: WFL    LOWER EXTREMITY MMT:    MMT Right eval Left eval  Hip flexion 3+/5 4-/5  Hip extension    Hip abduction    Hip adduction    Hip internal rotation    Hip external rotation    Knee flexion 3-/5 4-/5  Knee extension 3-/5 4-/5  Ankle dorsiflexion 3+/5 4-/5  Ankle plantarflexion    Ankle inversion    Ankle eversion     (Blank rows = not tested)  LOWER EXTREMITY ROM:     Passive  Right eval  Hip flexion   Hip extension   Hip abduction   Hip adduction   Hip internal rotation   Hip external rotation   Knee flexion 0-85 pain  Knee extension (6)  Ankle dorsiflexion   Ankle plantarflexion   Ankle inversion   Ankle eversion    (Blank rows = not tested)  LOWER EXTREMITY SPECIAL TESTS   Patellafemoral grind test: positive right    PALPATION: Moderate tenderness to palpation right patella, inferior patella  --------------------------------------------------------------------------------------------- TODAY'S TREATMENT:                                                                                                                               DATE:   08/12/23 Prone Quadriceps Stretch with Strap   Manual therapy to knee in prone including: PROM, hip flexor manual stretch Prone knee extension stretch  Supine Hamstring Stretch with Strap   Small Range Straight Leg Raise   Supine Bridge   08/04/23 NuStep Warm Up L3 Supine  Manual therapy to left knee including: knee extension mobilizations grade 2-3, prolonged stretch, instrument-assisted soft tissue mobilization to patellar tendon   DKTC with Green physioball x10  Hip bridges with G physioball 2x10  HS bridges with G physioball 2x10   SAQs with 3#, #6 x10  07/31/23 Supine  Manual therapy to left knee including: knee extension mobilizations grade 2-3, prolonged stretch   SAQs with 2# x10 Seated  Single leg press machine with 10# 2x10  Bike L3 cool-down   07/29/23 Supine  Manual therapy to left knee including: knee extension mobilizations grade 2-3, prolonged stretch   SAQs with 2# 2x10  LAQs with 2# 2x10 Gait Training with single point cane, 2 pt step through    07/21/23 Goal  review; establish HEP Bike seat 7 initially rocking then full revolutions 5 minutes Standing: with bil UE assist   Heel raise/toe raise 10X each knee flexion stretch 8" box 10X10"   Knee flexion Rt 10X  Hip abduction bil 10X each  Hip extension 10X each  Forward lunges 10X each onto 4" step no UE  Alternating march 10X each Supine:  heelslides Rt 10X  SLR Rt 10X with quad set (slight ext lag)  AROM Rt knee -3 to 100 degrees  07/16/23 PT Initial Eval Supine  PROM of right knee  Quad sets 10x5"H   PATIENT EDUCATION:  Education details: activity modification, pain management  Person educated: Patient Education method: Explanation Education comprehension: verbalized understanding  HOME EXERCISE PROGRAM:  Access Code: [email or web address] URL: [email or web address] Date: 08/12/2023 Prepared by: Seymour Bars  Exercises - Prone Quadriceps Stretch with Strap  - 1-2 x daily - 2 sets - 30 seconds hold - Supine Hamstring Stretch with Strap  - 1-2 x daily - 2 sets - 30 seconds  hold - Small Range Straight Leg Raise  - 1-2 x daily - 2 sets - 10 reps - Supine Bridge  - 1-2 x daily - 2 sets - 10 reps  ----------------------------------------------------------------------------------  ASSESSMENT:  CLINICAL IMPRESSION: Patient lacking 2-3* knee extension active range of motion today prior to session. Session focused on Prone position to facilitate hip flexor length, knee ROM. Patient tolerated well with no increase in pain levels; HEP updated. Pt will continue to benefit from skilled therapy to address her deficits.    PERSONAL FACTORS: Time since onset of injury/illness/exacerbation are also affecting patient's functional outcome.   REHAB POTENTIAL: Good  CLINICAL DECISION MAKING: Stable/uncomplicated  EVALUATION COMPLEXITY: Low  --------------------------------------------------------------------------------------------- GOALS: Goals reviewed with patient? No  SHORT TERM GOALS: Target date: 08/06/2023     1.  Patient will complete the 5 times sit to stand:    within 22s to demonstrate an improvement in ADL completion, stair negotiation, household/community ambulation, and self-care Baseline: 25 Goal status: IN PROGRESS  2. Patient will be independent with a basic stretching/strengthening HEP  Baseline:  Goal status: IN PROGRESS   LONG TERM GOALS: Target date: 08/27/2023    Patient will score a >/= 40/80 on the LEFS  to demonstrate a Minimally Clinically Importance Difference (MCID) in ADL completion, home/community ambulation, and lifting/bending/squatting.  Baseline:  Goal status:IN PROGRESS  2. Patient will complete the 5 times sit to stand:  within 19s to demonstrate an improvement in ADL completion, stair negotiation, household/community ambulation, and  self-care Baseline: 25 Goal status: IN PROGRESS  3.  Patient will be independent with a comprehensive strengthening HEP  Baseline:  Goal status: IN PROGRESS  4. Patient will be able to demonstrate right knee extension active range of motion to [facility] degrees to facilitate ADL completion, lifting, squatting. Baseline: (6) Goal status: IN PROGRESS --------------------------------------------------------------------------------------------- PLAN:  PT FREQUENCY: 1-2x/week  PT DURATION: 6 weeks  PLANNED INTERVENTIONS: 97110-Therapeutic exercises, 97530- Therapeutic activity, 97112- Neuromuscular re-education, 97535- Self Care, [id number]- Manual therapy, [phone]- Gait training, Patient/Family education, Balance training, Stair training, Taping, Dry Needling, Joint mobilization, Joint manipulation, Spinal manipulation, Spinal mobilization, DME instructions, Cryotherapy, and Moist heat.  PLAN FOR NEXT SESSION:  Follow up with HEP; continue to progress lower extremity strength.  Decrease seat distance to 5 or 6 next session.    Seymour Bars, PT 08/12/2023, 9:36 AM

## 2023-08-19 ENCOUNTER — Ambulatory Visit (HOSPITAL_COMMUNITY): Payer: 59 | Admitting: Physical Therapy

## 2023-08-19 DIAGNOSIS — R29898 Other symptoms and signs involving the musculoskeletal system: Secondary | ICD-10-CM

## 2023-08-19 DIAGNOSIS — R262 Difficulty in walking, not elsewhere classified: Secondary | ICD-10-CM

## 2023-08-19 DIAGNOSIS — M6281 Muscle weakness (generalized): Secondary | ICD-10-CM

## 2023-08-19 DIAGNOSIS — M25561 Pain in right knee: Secondary | ICD-10-CM

## 2023-08-19 NOTE — Therapy (Signed)
OUTPATIENT PHYSICAL THERAPY TREATMENT   Patient Name: Suzanne Lawrence MRN: [id number] DOB:1971/08/11, 52 y.o., female Today's Date: 08/19/2023  END OF SESSION:   PT End of Session - 08/19/23 1028     Visit Number 7    Number of Visits 12    Authorization Type [employer] Dual    Authorization Time Period no auth    Progress Note Due on Visit 10    PT Start Time 1025    PT Stop Time 1100    PT Time Calculation (min) 35 min    Activity Tolerance Patient tolerated treatment well    Behavior During Therapy WFL for tasks assessed/performed           *patient arrived 10 min late today    Past Medical History:  Diagnosis Date   Abnormal Pap smear of cervix    Anemia    issue with red blood cells being small   Anxiety    Arthritis    Carpal tunnel syndrome    right   Depression    Diabetes mellitus without complication (HCC)    DVT (deep venous thrombosis) (HCC) 03/2021   left arm   GERD (gastroesophageal reflux disease)    High blood pressure    History of hiatal hernia    Left breast mass    Pre-diabetes    Past Surgical History:  Procedure Laterality Date   BIOPSY  02/05/2022   Procedure: BIOPSY;  Surgeon: Lanelle Bal, DO;  Location: [facility];  Service: Endoscopy;;   COLONOSCOPY  2022   Megan Straughan: 7 polyps removed, has to return in one year   COLONOSCOPY  09/2021   Megan Straughan: 12 polyps removed   ESOPHAGOGASTRODUODENOSCOPY (EGD) WITH PROPOFOL N/A 02/05/2022   Procedure: ESOPHAGOGASTRODUODENOSCOPY (EGD) WITH PROPOFOL;  Surgeon: Lanelle Bal, DO;  Location: [facility];  Service: Endoscopy;  Laterality: N/A;  2:00pm   KNEE ARTHROSCOPY Right 10/28/2022   Procedure: RIGHT KNEE ARTHROSCOPY, DEBRIDEMENT;  Surgeon: Cammy Copa, MD;  Location: [facility];  Service: Orthopedics;  Laterality: Right;   left shoulder arthroscopy  12/2020   SHOULDER ARTHROSCOPY WITH OPEN ROTATOR CUFF REPAIR AND DISTAL CLAVICLE ACROMINECTOMY Left  03/21/2022   Procedure: LEFT SHOULDER ARTHOSCOPY, DEBRIDEMENT,  MANIPULATION, ROTATOR CUFF REPAIR, ARTHROSCOPIC DISTAL CLAVICLE EXCISION;  Surgeon: Cammy Copa, MD;  Location: [facility];  Service: Orthopedics;  Laterality: Left;   TUBAL LIGATION     Patient Active Problem List   Diagnosis Date Noted   Palpitations 03/25/2023   Nicotine abuse 03/25/2023   Non-cardiac chest pain 03/25/2023   DOE (dyspnea on exertion) 03/25/2023   Acute lateral meniscus tear of right knee 11/24/2022   Arthritis of left acromioclavicular joint    Complete tear of left rotator cuff    Abdominal pain, epigastric 01/23/2022   Iron deficiency anemia 12/13/2021   Iron deficiency anemia due to chronic blood loss 12/13/2021   GERD (gastroesophageal reflux disease)    Cubital tunnel syndrome of both upper extremities 01/25/2020   High blood pressure    Depression    Carpal tunnel syndrome    Anxiety     PCP: Donetta Potts, MDPCP - General   REFERRING PROVIDER: Julieanne Cotton, PA-C   REFERRING DIAG: [id number] (ICD-10-CM) - Chronic pain of right knee   Rationale for Evaluation and Treatment: Rehabilitation  THERAPY DIAG:  Difficulty in walking, not elsewhere classified  Right knee pain, unspecified chronicity  Muscle weakness (generalized)  Other symptoms and signs  involving the musculoskeletal system  ONSET DATE: October 2023 --------------------------------------------------------------------------------------------- SUBJECTIVE:                                                                                                                                                                                           SUBJECTIVE STATEMENT: Today: late arrival; continued discomfort in knee but describes as not really a pain.   Evaluation: Patient had a fall on the right knee October 2023. XR showed no fracture. Patient had MRI Jan 2024 to show right knee with "Complex degenerative  tearing of the anterior horn and body of the lateral meniscus with substance loss of the anterior horn." Patient underwent right knee arthroscopy March 2024. Patient chose not to do Rehab post-op. Since then, patient states the right knee feels like popping, stiff, weakness, and buckling. Patient saw MD in November 2024; MD referred to OPPT.  PERTINENT HISTORY:  Left RTC Repair 2024 Jan 2023 Acute tear of lateral meniscus- right    PAIN:  Are you having pain? Yes: NPRS scale: 3-4/10 Pain location: diffuse around right knee  Pain description: achy, sharp Aggravating factors: turning on the knee, walking on uneven surface Relieving factors: rest  PRECAUTIONS: None  RED FLAGS: None   WEIGHT BEARING RESTRICTIONS: No  FALLS:  Has patient fallen in last 6 months? No  LIVING ENVIRONMENT: Lives with: lives with their family Lives in: House/apartment Stairs: Yes: External: patient does not need to use stairs to get in  steps; none Has following equipment at home: Single point cane How many minutes per week do you regularly exercise? 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  PATIENT SURVEYS:  Lower Extremity Functional Score: 27 / 80 = 33.8 %    SCREENING FOR RED FLAGS: Bowel or bladder incontinence: No Spinal tumors: No Cauda equina syndrome: No Compression fracture: No Abdominal aneurysm: No  COGNITION: Overall cognitive status: Within functional limits for tasks assessed  POSTURE: No Significant postural  limitations      FUNCTIONAL TESTS:  5 times sit to stand: 25s with moderate UE push from the chair  20-29 years: 6.0  1.4 seconds 30-39 years: 6.1  1.4 seconds 40-49 years: 7.6  1.8 seconds 50-59 years: 7.7  2.6 seconds 60-69 years: 8.4  0.0 seconds (  female), 12.7  1.8 seconds (female) 70-79 years: 11.6  3.4 seconds (female), 13.0  4.8 seconds (female) 80-89 years: 16.7  4.5 seconds (female), 17.2  5.5 seconds (female) 90+ years: 19.5  2.3 seconds (female), 22.9  9.6 seconds (female)  The Minimal Clinically Important Difference (MCID) is 2.3 seconds.  A score of 16 seconds or less indicates that the participant is not likely to fall.  A score of more than 16 seconds indicates a higher risk of falls  As per [employer] Association (APTA) website   GAIT ANALYSIS: Distance walked: 72feet  Assistive device utilized: None Level of assistance: Complete Independence Comments: moderate antalgia on right knee; right knee maintained in flexion   SENSATION: WFL    LOWER EXTREMITY MMT:    MMT Right eval Left eval  Hip flexion 3+/5 4-/5  Hip extension    Hip abduction    Hip adduction    Hip internal rotation    Hip external rotation    Knee flexion 3-/5 4-/5  Knee extension 3-/5 4-/5  Ankle dorsiflexion 3+/5 4-/5  Ankle plantarflexion    Ankle inversion    Ankle eversion     (Blank rows = not tested)  LOWER EXTREMITY ROM:     Passive  Right eval  Hip flexion   Hip extension   Hip abduction   Hip adduction   Hip internal rotation   Hip external rotation   Knee flexion 0-85 pain  Knee extension (6)  Ankle dorsiflexion   Ankle plantarflexion   Ankle inversion   Ankle eversion    (Blank rows = not tested)  LOWER EXTREMITY SPECIAL TESTS   Patellafemoral grind test: positive right    PALPATION: Moderate tenderness to palpation right patella, inferior  patella  --------------------------------------------------------------------------------------------- TODAY'S TREATMENT:                                                                                                                              DATE:  08/19/23 Bike seat 7 5 minutes  Standing: with 1-no UE assist   Heel raise/toe raise 15X each knee flexion stretch 12" box 10X10" Hamstring stretch with 12" step 3X30" with OP   Knee flexion Rt 15X  Hip abduction bil 15X each  Hip extension 15X each  Forward lunges 15X each onto 6" step no UE  Alternating march 15X each   Tandem stance 30" X2 each LE lead  SLS each LE max 5" Rt, 12" Lt  5 trials each  6" forward step ups no UE 10X each 1 UE Rt, no UE LT  6" lateral step ups 10X each bil UE assist   08/12/23 Prone Quadriceps Stretch with Strap   Manual therapy to knee in prone including: PROM, hip flexor manual stretch Prone knee extension stretch  Supine Hamstring Stretch with Strap   Small Range Straight Leg Raise   Supine Bridge   08/04/23 NuStep Warm Up L3 Supine  Manual therapy to  left knee including: knee extension mobilizations grade 2-3, prolonged stretch, instrument-assisted soft tissue mobilization to patellar tendon   DKTC with Green physioball x10  Hip bridges with G physioball 2x10  HS bridges with G physioball 2x10   SAQs with 3#, #6 x10  07/31/23 Supine  Manual therapy to left knee including: knee extension mobilizations grade 2-3, prolonged stretch   SAQs with 2# x10 Seated  Single leg press machine with 10# 2x10  Bike L3 cool-down   07/29/23 Supine  Manual therapy to left knee including: knee extension mobilizations grade 2-3, prolonged stretch   SAQs with 2# 2x10  LAQs with 2# 2x10 Gait Training with single point cane, 2 pt step through     PATIENT EDUCATION:  Education details: activity modification, pain management  Person educated: Patient Education method: Explanation Education  comprehension: verbalized understanding  HOME EXERCISE PROGRAM:  Access Code: [email or web address] URL: [email or web address] Date: 08/12/2023 Prepared by: Seymour Bars  Exercises - Prone Quadriceps Stretch with Strap  - 1-2 x daily - 2 sets - 30 seconds hold - Supine Hamstring Stretch with Strap  - 1-2 x daily - 2 sets - 30 seconds  hold - Small Range Straight Leg Raise  - 1-2 x daily - 2 sets - 10 reps - Supine Bridge  - 1-2 x daily - 2 sets - 10 reps  ----------------------------------------------------------------------------------  ASSESSMENT:  CLINICAL IMPRESSION: Began on bike; attempted to move seat closer, however pt reported discomfort and unable to complete.  Progressed with standing exercises adding flexion and extension stretches with use of 12" step.  Discouraged use of UE where able except for needed with Rt step ups.  Pt able to complete all exercises today without rest break and no complaints of pain.  PT did report LE fatigue at end of session today.       Patient lacking 2-3* knee extension active range of motion today prior to session. Session focused on Prone position to facilitate hip flexor length, knee ROM. Patient tolerated well with no increase in pain levels; HEP updated. Pt will continue to benefit from skilled therapy to address her deficits.    PERSONAL FACTORS: Time since onset of injury/illness/exacerbation are also affecting patient's functional outcome.   REHAB POTENTIAL: Good  CLINICAL DECISION MAKING: Stable/uncomplicated  EVALUATION COMPLEXITY: Low  --------------------------------------------------------------------------------------------- GOALS: Goals reviewed with patient? No  SHORT TERM GOALS: Target date: 08/06/2023     1.  Patient will complete the 5 times sit to stand:    within 22s to demonstrate an improvement in ADL completion, stair negotiation, household/community ambulation, and self-care Baseline: 25 Goal status: IN  PROGRESS  2. Patient will be independent with a basic stretching/strengthening HEP  Baseline:  Goal status: IN PROGRESS   LONG TERM GOALS: Target date: 08/27/2023    Patient will score a >/= 40/80 on the LEFS  to demonstrate a Minimally Clinically Importance Difference (MCID) in ADL completion, home/community ambulation, and lifting/bending/squatting.  Baseline:  Goal status:IN PROGRESS  2. Patient will complete the 5 times sit to stand:  within 19s to demonstrate an improvement in ADL completion, stair negotiation, household/community ambulation, and self-care Baseline: 25 Goal status: IN PROGRESS  3.  Patient will be independent with a comprehensive strengthening HEP  Baseline:  Goal status: IN PROGRESS  4. Patient will be able to demonstrate right knee extension active range of motion to [facility] degrees to facilitate ADL completion, lifting, squatting. Baseline: (6) Goal status: IN PROGRESS --------------------------------------------------------------------------------------------- PLAN:  PT FREQUENCY: 1-2x/week  PT DURATION:  6 weeks  PLANNED INTERVENTIONS: 97110-Therapeutic exercises, 97530- Therapeutic activity, 97112- Neuromuscular re-education, [phone]- Self Care, 96295- Manual therapy, [phone]- Gait training, Patient/Family education, Balance training, Stair training, Taping, Dry Needling, Joint mobilization, Joint manipulation, Spinal manipulation, Spinal mobilization, DME instructions, Cryotherapy, and Moist heat.  PLAN FOR NEXT SESSION:  Continue to progress lower extremity strength.  Decrease seat distance when able on bike.    Bascom Levels, Evoleth Nordmeyer B, PTA 08/19/2023, 10:29 AM

## 2023-08-25 ENCOUNTER — Ambulatory Visit (HOSPITAL_COMMUNITY): Payer: 59

## 2023-08-25 DIAGNOSIS — R262 Difficulty in walking, not elsewhere classified: Secondary | ICD-10-CM | POA: Diagnosis not present

## 2023-08-25 DIAGNOSIS — M6281 Muscle weakness (generalized): Secondary | ICD-10-CM

## 2023-08-25 DIAGNOSIS — M25561 Pain in right knee: Secondary | ICD-10-CM

## 2023-08-25 NOTE — Therapy (Signed)
OUTPATIENT PHYSICAL THERAPY TREATMENT   Patient Name: Suzanne Lawrence MRN: [id number] DOB:1971/06/01, 52 y.o., female Today's Date: 08/25/2023  END OF SESSION:   PT End of Session - 08/25/23 0929     Visit Number 8    Number of Visits 12    Authorization Type [employer] Dual    Authorization Time Period no auth    Progress Note Due on Visit 10    PT Start Time 0930    PT Stop Time 1010    PT Time Calculation (min) 40 min    Activity Tolerance Patient tolerated treatment well    Behavior During Therapy WFL for tasks assessed/performed           *patient arrived 10 min late today    Past Medical History:  Diagnosis Date   Abnormal Pap smear of cervix    Anemia    issue with red blood cells being small   Anxiety    Arthritis    Carpal tunnel syndrome    right   Depression    Diabetes mellitus without complication (HCC)    DVT (deep venous thrombosis) (HCC) 03/2021   left arm   GERD (gastroesophageal reflux disease)    High blood pressure    History of hiatal hernia    Left breast mass    Pre-diabetes    Past Surgical History:  Procedure Laterality Date   BIOPSY  02/05/2022   Procedure: BIOPSY;  Surgeon: Lanelle Bal, DO;  Location: [facility];  Service: Endoscopy;;   COLONOSCOPY  2022   Megan Straughan: 7 polyps removed, has to return in one year   COLONOSCOPY  09/2021   Megan Straughan: 12 polyps removed   ESOPHAGOGASTRODUODENOSCOPY (EGD) WITH PROPOFOL N/A 02/05/2022   Procedure: ESOPHAGOGASTRODUODENOSCOPY (EGD) WITH PROPOFOL;  Surgeon: Lanelle Bal, DO;  Location: [facility];  Service: Endoscopy;  Laterality: N/A;  2:00pm   KNEE ARTHROSCOPY Right 10/28/2022   Procedure: RIGHT KNEE ARTHROSCOPY, DEBRIDEMENT;  Surgeon: Cammy Copa, MD;  Location: [facility];  Service: Orthopedics;  Laterality: Right;   left shoulder arthroscopy  12/2020   SHOULDER ARTHROSCOPY WITH OPEN ROTATOR CUFF REPAIR AND DISTAL CLAVICLE ACROMINECTOMY Left  03/21/2022   Procedure: LEFT SHOULDER ARTHOSCOPY, DEBRIDEMENT,  MANIPULATION, ROTATOR CUFF REPAIR, ARTHROSCOPIC DISTAL CLAVICLE EXCISION;  Surgeon: Cammy Copa, MD;  Location: [facility];  Service: Orthopedics;  Laterality: Left;   TUBAL LIGATION     Patient Active Problem List   Diagnosis Date Noted   Palpitations 03/25/2023   Nicotine abuse 03/25/2023   Non-cardiac chest pain 03/25/2023   DOE (dyspnea on exertion) 03/25/2023   Acute lateral meniscus tear of right knee 11/24/2022   Arthritis of left acromioclavicular joint    Complete tear of left rotator cuff    Abdominal pain, epigastric 01/23/2022   Iron deficiency anemia 12/13/2021   Iron deficiency anemia due to chronic blood loss 12/13/2021   GERD (gastroesophageal reflux disease)    Cubital tunnel syndrome of both upper extremities 01/25/2020   High blood pressure    Depression    Carpal tunnel syndrome    Anxiety     PCP: Donetta Potts, MDPCP - General   REFERRING PROVIDER: Julieanne Cotton, PA-C   REFERRING DIAG: [phone] (ICD-10-CM) - Chronic pain of right knee   Rationale for Evaluation and Treatment: Rehabilitation  THERAPY DIAG:  Difficulty in walking, not elsewhere classified  Right knee pain, unspecified chronicity  Muscle weakness (generalized)  ONSET DATE: October 2023 ---------------------------------------------------------------------------------------------  SUBJECTIVE:                                                                                                                                                                                           SUBJECTIVE STATEMENT: Today: Patient with 3/10 ache in right knee; walking with less limping at home    Evaluation: Patient had a fall on the right knee October 2023. XR showed no fracture. Patient had MRI Jan 2024 to show right knee with "Complex degenerative tearing of the anterior horn and body of the lateral meniscus with substance  loss of the anterior horn." Patient underwent right knee arthroscopy March 2024. Patient chose not to do Rehab post-op. Since then, patient states the right knee feels like popping, stiff, weakness, and buckling. Patient saw MD in November 2024; MD referred to OPPT.  PERTINENT HISTORY:  Left RTC Repair 2024 Jan 2023 Acute tear of lateral meniscus- right    PAIN:  Are you having pain? Yes: NPRS scale: 3-4/10 Pain location: diffuse around right knee  Pain description: achy, sharp Aggravating factors: turning on the knee, walking on uneven surface Relieving factors: rest  PRECAUTIONS: None  RED FLAGS: None   WEIGHT BEARING RESTRICTIONS: No  FALLS:  Has patient fallen in last 6 months? No  LIVING ENVIRONMENT: Lives with: lives with their family Lives in: House/apartment Stairs: Yes: External: patient does not need to use stairs to get in  steps; none Has following equipment at home: Single point cane How many minutes per week do you regularly exercise? 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  PATIENT SURVEYS:  Lower Extremity Functional Score: 27 / 80 = 33.8 %    SCREENING FOR RED FLAGS: Bowel or bladder incontinence: No Spinal tumors: No Cauda equina syndrome: No Compression fracture: No Abdominal aneurysm: No  COGNITION: Overall cognitive status: Within functional limits for tasks assessed  POSTURE: No Significant postural limitations      FUNCTIONAL TESTS:  5 times sit to stand: 25s with  moderate UE push from the chair  20-29 years: 6.0  1.4 seconds 30-39 years: 6.1  1.4 seconds 40-49 years: 7.6  1.8 seconds 50-59 years: 7.7  2.6 seconds 60-69 years: 8.4  0.0 seconds (female), 12.7  1.8 seconds (female) 70-79 years: 92.6  3.4 seconds (female), 13.0  4.8 seconds (female) 80-89 years: 16.7  4.5 seconds (female), 17.2  5.5 seconds (female) 90+ years: 19.5  2.3 seconds (female), 22.9  9.6 seconds (female)  The Minimal Clinically Important Difference (MCID) is 2.3 seconds.  A score of 16 seconds or less indicates that the participant is not likely to fall.  A score of more than 16 seconds indicates a higher risk of falls  As per [employer] Association (APTA) website   GAIT ANALYSIS: Distance walked: 9feet  Assistive device utilized: None Level of assistance: Complete Independence Comments: moderate antalgia on right knee; right knee maintained in flexion   SENSATION: WFL    LOWER EXTREMITY MMT:    MMT Right eval Left eval  Hip flexion 3+/5 4-/5  Hip extension    Hip abduction    Hip adduction    Hip internal rotation    Hip external rotation    Knee flexion 3-/5 4-/5  Knee extension 3-/5 4-/5  Ankle dorsiflexion 3+/5 4-/5  Ankle plantarflexion    Ankle inversion    Ankle eversion     (Blank rows = not tested)  LOWER EXTREMITY ROM:     Passive  Right eval  Hip flexion   Hip extension   Hip abduction   Hip adduction   Hip internal rotation   Hip external rotation   Knee flexion 0-85 pain  Knee extension (6)  Ankle dorsiflexion   Ankle plantarflexion   Ankle inversion   Ankle eversion    (Blank rows = not tested)  LOWER EXTREMITY SPECIAL TESTS   Patellafemoral grind test: positive right    PALPATION: Moderate tenderness to palpation right patella, inferior patella  --------------------------------------------------------------------------------------------- TODAY'S TREATMENT:                                                                                                                               DATE:   08/25/23 Supine  Manual therapy to right lower extremity including: patellar mobilizations grade 2-3, soft tissue mobilization to knee  Seated  Single leg press with 20#, as tolerated Standing  Weighted sled pushes x4 laps  Forward wt shift+ medi ball on 4" step  Forward wt shift+ medi ball on 4" step + step up  Knee flexion with 3# 2x10  4" step downs     08/19/23 Bike seat 7 5 minutes  Standing: with 1-no UE assist   Heel raise/toe raise 15X each knee flexion stretch 12" box 10X10" Hamstring stretch with 12" step 3X30" with OP   Knee flexion Rt 15X  Hip abduction bil 15X each  Hip extension 15X each  Forward lunges 15X each onto 6" step no UE  Alternating march 15X each   Tandem stance 30" X2 each LE lead  SLS each LE max 5" Rt, 12" Lt  5 trials each  6" forward step ups no UE 10X each 1 UE Rt, no UE LT  6" lateral  step ups 10X each bil UE assist   08/12/23 Prone Quadriceps Stretch with Strap   Manual therapy to knee in prone including: PROM, hip flexor manual stretch Prone knee extension stretch  Supine Hamstring Stretch with Strap   Small Range Straight Leg Raise   Supine Bridge   08/04/23 NuStep Warm Up L3 Supine  Manual therapy to left knee including: knee extension mobilizations grade 2-3, prolonged stretch, instrument-assisted soft tissue mobilization to patellar tendon   DKTC with Green physioball x10  Hip bridges with G physioball 2x10  HS bridges with G physioball 2x10   SAQs with 3#, #6 x10  07/31/23 Supine  Manual therapy to left knee including: knee extension mobilizations grade 2-3, prolonged stretch   SAQs with 2# x10 Seated  Single leg press machine with 10# 2x10  Bike L3 cool-down   07/29/23 Supine  Manual therapy to left knee including: knee extension mobilizations grade 2-3, prolonged stretch   SAQs with 2# 2x10  LAQs with 2# 2x10 Gait Training  with single point cane, 2 pt step through     PATIENT EDUCATION:  Education details: activity modification, pain management  Person educated: Patient Education method: Explanation Education comprehension: verbalized understanding  HOME EXERCISE PROGRAM:  Access Code: [email or web address] URL: [email or web address] Date: 08/25/2023 Prepared by: Seymour Bars  Exercises - Prone Quadriceps Stretch with Strap  - 1-2 x daily - 2 sets - 30 seconds hold - Supine Hamstring Stretch with Strap  - 1-2 x daily - 2 sets - 30 seconds  hold - Small Range Straight Leg Raise  - 1-2 x daily - 2 sets - 10 reps - Supine Bridge  - 1-2 x daily - 2 sets - 10 reps - Forward Step Up  - 2 x daily - 1-2 x weekly - 2 sets - 10 reps  ----------------------------------------------------------------------------------  ASSESSMENT:  CLINICAL IMPRESSION: Began with manual therapy to address right knee pain. PT then progressed patient with right lower extremity strengthening focused on concentric/eccentric Quadriceps. Patient with minimal discomfort during concentric knee extension but corrected with PT verbal/tactile cues.     Patient tolerated well with little to no increase in pain levels; HEP updated. Pt will continue to benefit from skilled therapy to address her deficits.    PERSONAL FACTORS: Time since onset of injury/illness/exacerbation are also affecting patient's functional outcome.   REHAB POTENTIAL: Good  CLINICAL DECISION MAKING: Stable/uncomplicated  EVALUATION COMPLEXITY: Low  --------------------------------------------------------------------------------------------- GOALS: Goals reviewed with patient? No  SHORT TERM GOALS: Target date: 08/06/2023     1.  Patient will complete the 5 times sit to stand:    within 22s to demonstrate an improvement in ADL completion, stair negotiation, household/community ambulation, and self-care Baseline: 25 Goal status: IN PROGRESS  2. Patient  will be independent with a basic stretching/strengthening HEP  Baseline:  Goal status: IN PROGRESS   LONG TERM GOALS: Target date: 08/27/2023    Patient will score a >/= 40/80 on the LEFS  to demonstrate a Minimally Clinically Importance Difference (MCID) in ADL completion, home/community ambulation, and lifting/bending/squatting.  Baseline:  Goal status:IN PROGRESS  2. Patient will complete the 5 times sit to stand:  within 19s to demonstrate an improvement in ADL completion, stair negotiation, household/community ambulation, and self-care Baseline: 25 Goal status: IN PROGRESS  3.  Patient will be independent with a comprehensive strengthening HEP  Baseline:  Goal status: IN PROGRESS  4. Patient will be able to demonstrate right knee extension active range of motion to [facility] degrees to  facilitate ADL completion, lifting, squatting. Baseline: (6) Goal status: IN PROGRESS --------------------------------------------------------------------------------------------- PLAN:  PT FREQUENCY: 1-2x/week  PT DURATION: 6 weeks  PLANNED INTERVENTIONS: 97110-Therapeutic exercises, 97530- Therapeutic activity, 97112- Neuromuscular re-education, 97535- Self Care, [id number]- Manual therapy, [phone]- Gait training, Patient/Family education, Balance training, Stair training, Taping, Dry Needling, Joint mobilization, Joint manipulation, Spinal manipulation, Spinal mobilization, DME instructions, Cryotherapy, and Moist heat.  PLAN FOR NEXT SESSION:  Continue to progress lower extremity strength  Seymour Bars, PT 08/25/2023, 11:32 AM

## 2023-08-26 ENCOUNTER — Telehealth: Payer: Self-pay | Admitting: *Deleted

## 2023-08-26 NOTE — Telephone Encounter (Signed)
 Patient left a voicemail that her blood sugars,(numbers) have been running low over the last week through out the day.Patient was called to get readings  Pt called with high BG readings.   Date Before breakfast Before lunch Before supper Bedtime  08/23/2023 89 113  Lispro -14 units 118 Lispro - 14 units 88- held night time insulin   08/24/2023 86 79 102 158 Lantus  60 units  08/25/23 100 94 59 patient did a finger stick and it was 81 92 held night time insulin   08/26/2023 100 81      Pt taking: .Patient feels drowsy, shaky. She does not eat breakfast morning, not a lot. She drinks a Glycernia every morning. She takes Lispro 12-18 units three times a day per sliding scale , Lantus  60  units at bedtime, Ozempic  0.25 mg weekly.

## 2023-08-26 NOTE — Telephone Encounter (Signed)
Lower Lantus to 50 units nightly and lower Lispro to 8-14 units TID, that should help prevent her from dropping.  These drops are a good sign we are on the right path, now we just need to taper down just a bit.

## 2023-08-26 NOTE — Telephone Encounter (Signed)
Patient was called and made aware. 

## 2023-09-02 DIAGNOSIS — B070 Plantar wart: Secondary | ICD-10-CM | POA: Diagnosis not present

## 2023-09-05 ENCOUNTER — Ambulatory Visit (HOSPITAL_COMMUNITY): Payer: 59 | Attending: Surgical

## 2023-09-05 DIAGNOSIS — R262 Difficulty in walking, not elsewhere classified: Secondary | ICD-10-CM | POA: Insufficient documentation

## 2023-09-05 DIAGNOSIS — M25561 Pain in right knee: Secondary | ICD-10-CM | POA: Diagnosis not present

## 2023-09-05 DIAGNOSIS — M6281 Muscle weakness (generalized): Secondary | ICD-10-CM | POA: Diagnosis not present

## 2023-09-05 NOTE — Therapy (Signed)
 OUTPATIENT PHYSICAL THERAPY TREATMENT  Progress Note 11/20-1/1-/25   Patient Name: Tran Randle MRN: [id number] DOB:1971-07-14, 53 y.o., female Today's Date: 09/05/2023  END OF SESSION:   PT End of Session - 09/05/23 1020     Visit Number 9    Number of Visits 12    Authorization Type [employer] Dual    Authorization Time Period no auth    Progress Note Due on Visit 18    PT Start Time 1015    PT Stop Time 1040    PT Time Calculation (min) 25 min    Activity Tolerance Patient tolerated treatment well    Behavior During Therapy WFL for tasks assessed/performed              Past Medical History:  Diagnosis Date   Abnormal Pap smear of cervix    Anemia    issue with red blood cells being small   Anxiety    Arthritis    Carpal tunnel syndrome    right   Depression    Diabetes mellitus without complication (HCC)    DVT (deep venous thrombosis) (HCC) 03/2021   left arm   GERD (gastroesophageal reflux disease)    High blood pressure    History of hiatal hernia    Left breast mass    Pre-diabetes    Past Surgical History:  Procedure Laterality Date   BIOPSY  02/05/2022   Procedure: BIOPSY;  Surgeon: Cindie Carlin POUR, DO;  Location: [facility];  Service: Endoscopy;;   COLONOSCOPY  2022   Megan Straughan: 7 polyps removed, has to return in one year   COLONOSCOPY  09/2021   Megan Straughan: 12 polyps removed   ESOPHAGOGASTRODUODENOSCOPY (EGD) WITH PROPOFOL  N/A 02/05/2022   Procedure: ESOPHAGOGASTRODUODENOSCOPY (EGD) WITH PROPOFOL ;  Surgeon: Cindie Carlin POUR, DO;  Location: [facility];  Service: Endoscopy;  Laterality: N/A;  2:00pm   KNEE ARTHROSCOPY Right 10/28/2022   Procedure: RIGHT KNEE ARTHROSCOPY, DEBRIDEMENT;  Surgeon: Addie Cordella Hamilton, MD;  Location: [facility];  Service: Orthopedics;  Laterality: Right;   left shoulder arthroscopy  12/2020   SHOULDER ARTHROSCOPY WITH OPEN ROTATOR CUFF REPAIR AND DISTAL CLAVICLE ACROMINECTOMY Left 03/21/2022    Procedure: LEFT SHOULDER ARTHOSCOPY, DEBRIDEMENT,  MANIPULATION, ROTATOR CUFF REPAIR, ARTHROSCOPIC DISTAL CLAVICLE EXCISION;  Surgeon: Addie Cordella Hamilton, MD;  Location: [facility];  Service: Orthopedics;  Laterality: Left;   TUBAL LIGATION     Patient Active Problem List   Diagnosis Date Noted   Palpitations 03/25/2023   Nicotine abuse 03/25/2023   Non-cardiac chest pain 03/25/2023   DOE (dyspnea on exertion) 03/25/2023   Acute lateral meniscus tear of right knee 11/24/2022   Arthritis of left acromioclavicular joint    Complete tear of left rotator cuff    Abdominal pain, epigastric 01/23/2022   Iron deficiency anemia 12/13/2021   Iron deficiency anemia due to chronic blood loss 12/13/2021   GERD (gastroesophageal reflux disease)    Cubital tunnel syndrome of both upper extremities 01/25/2020   High blood pressure    Depression    Carpal tunnel syndrome    Anxiety     PCP: Trudy Vaughn FALCON, MDPCP - General   REFERRING PROVIDER: Shirly Carlin CROME, PA-C   REFERRING DIAG: [phone] (ICD-10-CM) - Chronic pain of right knee   Rationale for Evaluation and Treatment: Rehabilitation  THERAPY DIAG:  Difficulty in walking, not elsewhere classified  Right knee pain, unspecified chronicity  Muscle weakness (generalized)  ONSET DATE: October 2023 --------------------------------------------------------------------------------------------- SUBJECTIVE:  SUBJECTIVE STATEMENT: Progress Note 09/05/23: Patient reports since starting PT on 11/20, she feels as if she feels about a 50% improvement especially in here walking. Patient still reports a pressure in the knee when she steps up, cold weather, and turning/twisting. Patient states pain remains about 3/10 daily with a nagging quality. Patient would  like to continue PT to become stronger.    Evaluation: Patient had a fall on the right knee October 2023. XR showed no fracture. Patient had MRI Jan 2024 to show right knee with Complex degenerative tearing of the anterior horn and body of the lateral meniscus with substance loss of the anterior horn. Patient underwent right knee arthroscopy March 2024. Patient chose not to do Rehab post-op. Since then, patient states the right knee feels like popping, stiff, weakness, and buckling. Patient saw MD in November 2024; MD referred to OPPT.  PERTINENT HISTORY:  Left RTC Repair 2024 Jan 2023 Acute tear of lateral meniscus- right    PAIN:  Are you having pain? Yes: NPRS scale: 3-4/10 Pain location: diffuse around right knee  Pain description: achy, sharp Aggravating factors: turning on the knee, walking on uneven surface Relieving factors: rest  PRECAUTIONS: None  RED FLAGS: None   WEIGHT BEARING RESTRICTIONS: No  FALLS:  Has patient fallen in last 6 months? No  LIVING ENVIRONMENT: Lives with: lives with their family Lives in: House/apartment Stairs: Yes: External: patient does not need to use stairs to get in  steps; none Has following equipment at home: Single point cane How many minutes per week do you regularly exercise? 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  PATIENT SURVEYS:  Lower Extremity Functional Score: 27 / 80 = 33.8 %  09/05/23 Lower Extremity Functional Score: 28 / 80 = 35.0 %    SCREENING  FOR RED FLAGS: Bowel or bladder incontinence: No Spinal tumors: No Cauda equina syndrome: No Compression fracture: No Abdominal aneurysm: No  COGNITION: Overall cognitive status: Within functional limits for tasks assessed  POSTURE: No Significant postural limitations      FUNCTIONAL TESTS:  5 times sit to stand: 25s with moderate UE push from the chair  20-29 years: 6.0  1.4 seconds 30-39 years: 6.1  1.4 seconds 40-49 years: 7.6  1.8 seconds 50-59 years: 7.7  2.6 seconds 60-69 years: 8.4  0.0 seconds (female), 12.7  1.8 seconds (female) 70-79 years: 11.6  3.4 seconds (female), 13.0  4.8 seconds (female) 80-89 years: 16.7  4.5 seconds (female), 17.2  5.5 seconds (female) 90+ years: 19.5  2.3 seconds (female), 22.9  9.6 seconds (female)  The Minimal Clinically Important Difference (MCID) is 2.3 seconds.  A score of 16 seconds or less indicates that the participant is not likely to fall.  A score of more than 16 seconds indicates a higher risk of falls  As per [employer] Association (APTA) website   09/05/23  5 STS: 21.87 seconds with NO upper extremity push from chair  GAIT ANALYSIS: Distance walked: 16feet  Assistive device utilized: None Level of assistance: Complete Independence Comments: moderate antalgia on right knee; right knee maintained in flexion   SENSATION: [facility]  LOWER EXTREMITY MMT:    MMT Right eval Left eval Right  09/05/23  Hip flexion 3+/5 4-/5 3+/5  Hip extension     Hip abduction     Hip adduction     Hip internal rotation     Hip external rotation     Knee flexion 3-/5 4-/5 3/5  Knee extension 3-/5 4-/5 3/5  Ankle dorsiflexion 3+/5 4-/5 3+/5  Ankle plantarflexion     Ankle inversion     Ankle eversion      (Blank rows = not tested)  LOWER EXTREMITY ROM:     Passive  Right eval Right 09/05/23  Hip flexion    Hip extension    Hip abduction    Hip adduction    Hip internal rotation    Hip external rotation     Knee flexion 0-85 pain 0-92  Knee extension (6) (2)  Ankle dorsiflexion    Ankle plantarflexion    Ankle inversion    Ankle eversion     (Blank rows = not tested)  LOWER EXTREMITY SPECIAL TESTS   Patellafemoral grind test: positive right    PALPATION: Moderate tenderness to palpation right patella, inferior patella  --------------------------------------------------------------------------------------------- TODAY'S TREATMENT:                                                                                                                              DATE:   09/05/23 PT Progress note tests & measures  08/25/23 Supine  Manual therapy to right lower extremity including: patellar mobilizations grade 2-3, soft tissue mobilization to knee  Seated  Single leg press with 20#, as tolerated Standing  Weighted sled pushes x4 laps  Forward wt shift+ medi ball on 4 step  Forward wt shift+ medi ball on 4 step + step up  Knee flexion with 3# 2x10  4 step downs     08/19/23 Bike seat 7 5 minutes  Standing: with 1-no UE assist   Heel raise/toe raise 15X each knee flexion stretch 12 box 10X10 Hamstring stretch with 12 step 3X30 with OP   Knee flexion Rt 15X  Hip abduction bil 15X each  Hip extension 15X each  Forward lunges 15X each onto 6 step no UE  Alternating march 15X each   Tandem stance 30 X2 each LE lead  SLS each LE max 5 Rt, 12 Lt  5 trials each  6 forward step ups no UE 10X each 1 UE Rt, no UE LT  6 lateral step ups 10X each bil UE assist   08/12/23 Prone Quadriceps Stretch with Strap   Manual therapy to knee in prone including: PROM, hip flexor manual stretch Prone knee extension stretch  Supine Hamstring Stretch with Strap   Small Range Straight Leg Raise   Supine Bridge   08/04/23 NuStep Warm Up L3 Supine  Manual therapy to left knee including: knee extension mobilizations grade 2-3, prolonged stretch, instrument-assisted soft tissue  mobilization to patellar tendon  DKTC with Green physioball x10  Hip bridges with G physioball 2x10  HS bridges with G physioball 2x10   SAQs with 3#, #6 x10  07/31/23 Supine  Manual therapy to left knee including: knee extension mobilizations grade 2-3, prolonged stretch   SAQs with 2# x10 Seated  Single leg press machine with 10# 2x10  Bike L3 cool-down   07/29/23 Supine  Manual therapy to left knee including: knee extension mobilizations grade 2-3, prolonged stretch   SAQs with 2# 2x10  LAQs with 2# 2x10 Gait Training with single point cane, 2 pt step through     PATIENT EDUCATION:  Education details: activity modification, pain management  Person educated: Patient Education method: Explanation Education comprehension: verbalized understanding  HOME EXERCISE PROGRAM:  Access Code: [email or web address] URL: [email or web address] Date: 08/25/2023 Prepared by: Deward Ming  Exercises - Prone Quadriceps Stretch with Strap  - 1-2 x daily - 2 sets - 30 seconds hold - Supine Hamstring Stretch with Strap  - 1-2 x daily - 2 sets - 30 seconds  hold - Small Range Straight Leg Raise  - 1-2 x daily - 2 sets - 10 reps - Supine Bridge  - 1-2 x daily - 2 sets - 10 reps - Forward Step Up  - 2 x daily - 1-2 x weekly - 2 sets - 10 reps  ----------------------------------------------------------------------------------  ASSESSMENT:  CLINICAL IMPRESSION: Progress Note 09/05/23: Patient has shown overall improvements in Lower Extremity Functional Scale (LEFS) up by 1 point, pain down to 3/10, strength up by  grade, and knee active range of motion into flexion/extension. Patient still presents with limitations in: knee full active range of motion, strength, pain levels preventing pain-free ADL completion, community ambulation, stair negotiation. . Patient has completed (9) visits since the initial evaluation and has met 2/6 stated rehab goal. At this moment, patient will continue to  benefit from PT 1x/ week for 6 weeks to return to prior level of function.     Eval: Patient is a 53 y.o. y.o. female who was seen today for physical therapy evaluation and treatment for difficulty walking, muscle weakness due to right knee pain . Patient presents to PT with the following objective impairments: Abnormal gait, decreased activity tolerance, decreased endurance, difficulty walking, decreased ROM, decreased strength, improper body mechanics, and pain. These impairments limit the patient in activities such as carrying, lifting, bending, standing, squatting, stairs, locomotion level, and caring for others. These impairments also limit the patient in participation such as meal prep, cleaning, laundry, interpersonal relationship, driving, shopping, community activity, and yard work. The patient will benefit from PT to address the limitations/impairments listed below to return to their prior level of function in the domains of activity and participation.   PERSONAL FACTORS: Time since onset of injury/illness/exacerbation are also affecting patient's functional outcome.   REHAB POTENTIAL: Good  CLINICAL DECISION MAKING: Stable/uncomplicated  EVALUATION COMPLEXITY: Low  --------------------------------------------------------------------------------------------- GOALS: Goals reviewed with patient? No  SHORT TERM GOALS: Target date: 08/06/2023     1.  Patient will complete the 5 times sit to stand:    within 22s to demonstrate an improvement in ADL completion, stair negotiation, household/community ambulation, and self-care Baseline: 25 Goal status: goal met   2. Patient will be independent with a basic stretching/strengthening HEP  Baseline:  Goal status: goal met    LONG TERM GOALS: Target date: 08/27/2023  --> 10/07/13 new date     Patient will score a >/= 40/80 on the LEFS  to demonstrate a Minimally  Clinically Importance Difference (MCID) in ADL completion, home/community  ambulation, and lifting/bending/squatting.  Baseline:  Goal status: partially met  2. Patient will complete the 5 times sit to stand:  within 19s to demonstrate an improvement in ADL completion, stair negotiation, household/community ambulation, and self-care Baseline: 25 Goal status: partially met  3.  Patient will be independent with a comprehensive strengthening HEP  Baseline:  Goal status: partially met  4. Patient will be able to demonstrate right knee extension active range of motion to [facility] degrees to facilitate ADL completion, lifting, squatting. Baseline: (6) --> (2)  Goal status: mostly met  --------------------------------------------------------------------------------------------- PLAN:  PT FREQUENCY: 1x/week   PT DURATION: 6 weeks   PLANNED INTERVENTIONS: 97110-Therapeutic exercises, 97530- Therapeutic activity, 97112- Neuromuscular re-education, 97535- Self Care, [id number]- Manual therapy, [phone]- Gait training, Patient/Family education, Balance training, Stair training, Taping, Dry Needling, Joint mobilization, Joint manipulation, Spinal manipulation, Spinal mobilization, DME instructions, Cryotherapy, and Moist heat.  PLAN FOR NEXT SESSION:  Continue to progress lower extremity strength, active range of motion.  Deward Ming, PT 09/05/2023, 10:52 AM

## 2023-09-08 ENCOUNTER — Ambulatory Visit (HOSPITAL_COMMUNITY): Payer: 59

## 2023-09-08 DIAGNOSIS — M25561 Pain in right knee: Secondary | ICD-10-CM | POA: Diagnosis not present

## 2023-09-08 DIAGNOSIS — M6281 Muscle weakness (generalized): Secondary | ICD-10-CM | POA: Diagnosis not present

## 2023-09-08 DIAGNOSIS — R262 Difficulty in walking, not elsewhere classified: Secondary | ICD-10-CM

## 2023-09-08 NOTE — Therapy (Signed)
 OUTPATIENT PHYSICAL THERAPY TREATMENT  Progress Note 11/20-1/1-/25   Patient Name: Suzanne Lawrence MRN: [id number] DOB:April 25, 1971, 53 y.o., female Today's Date: 09/08/2023  END OF SESSION:   PT End of Session - 09/08/23 1059     Visit Number 10    Number of Visits 14    Authorization Type [employer] Dual    Authorization Time Period no auth    Progress Note Due on Visit 18    PT Start Time 1100    PT Stop Time 1140    PT Time Calculation (min) 40 min    Activity Tolerance Patient tolerated treatment well    Behavior During Therapy WFL for tasks assessed/performed              Past Medical History:  Diagnosis Date   Abnormal Pap smear of cervix    Anemia    issue with red blood cells being small   Anxiety    Arthritis    Carpal tunnel syndrome    right   Depression    Diabetes mellitus without complication (HCC)    DVT (deep venous thrombosis) (HCC) 03/2021   left arm   GERD (gastroesophageal reflux disease)    High blood pressure    History of hiatal hernia    Left breast mass    Pre-diabetes    Past Surgical History:  Procedure Laterality Date   BIOPSY  02/05/2022   Procedure: BIOPSY;  Surgeon: Cindie Carlin POUR, DO;  Location: [facility];  Service: Endoscopy;;   COLONOSCOPY  2022   Megan Straughan: 7 polyps removed, has to return in one year   COLONOSCOPY  09/2021   Megan Straughan: 12 polyps removed   ESOPHAGOGASTRODUODENOSCOPY (EGD) WITH PROPOFOL  N/A 02/05/2022   Procedure: ESOPHAGOGASTRODUODENOSCOPY (EGD) WITH PROPOFOL ;  Surgeon: Cindie Carlin POUR, DO;  Location: [facility];  Service: Endoscopy;  Laterality: N/A;  2:00pm   KNEE ARTHROSCOPY Right 10/28/2022   Procedure: RIGHT KNEE ARTHROSCOPY, DEBRIDEMENT;  Surgeon: Addie Cordella Hamilton, MD;  Location: [facility];  Service: Orthopedics;  Laterality: Right;   left shoulder arthroscopy  12/2020   SHOULDER ARTHROSCOPY WITH OPEN ROTATOR CUFF REPAIR AND DISTAL CLAVICLE ACROMINECTOMY Left  03/21/2022   Procedure: LEFT SHOULDER ARTHOSCOPY, DEBRIDEMENT,  MANIPULATION, ROTATOR CUFF REPAIR, ARTHROSCOPIC DISTAL CLAVICLE EXCISION;  Surgeon: Addie Cordella Hamilton, MD;  Location: [facility];  Service: Orthopedics;  Laterality: Left;   TUBAL LIGATION     Patient Active Problem List   Diagnosis Date Noted   Palpitations 03/25/2023   Nicotine abuse 03/25/2023   Non-cardiac chest pain 03/25/2023   DOE (dyspnea on exertion) 03/25/2023   Acute lateral meniscus tear of right knee 11/24/2022   Arthritis of left acromioclavicular joint    Complete tear of left rotator cuff    Abdominal pain, epigastric 01/23/2022   Iron deficiency anemia 12/13/2021   Iron deficiency anemia due to chronic blood loss 12/13/2021   GERD (gastroesophageal reflux disease)    Cubital tunnel syndrome of both upper extremities 01/25/2020   High blood pressure    Depression    Carpal tunnel syndrome    Anxiety     PCP: Trudy Vaughn FALCON, MDPCP - General   REFERRING PROVIDER: Shirly Carlin CROME, PA-C   REFERRING DIAG: [phone] (ICD-10-CM) - Chronic pain of right knee   Rationale for Evaluation and Treatment: Rehabilitation  THERAPY DIAG:  No diagnosis found.  ONSET DATE: October 2023 --------------------------------------------------------------------------------------------- SUBJECTIVE:  SUBJECTIVE STATEMENT: Today: Patient reports 3/10 pain in right knee today.   Progress Note 09/05/23: Patient reports since starting PT on 11/20, she feels as if she feels about a 50% improvement especially in here walking. Patient still reports a pressure in the knee when she steps up, cold weather, and turning/twisting. Patient states pain remains about 3/10 daily with a nagging quality. Patient would like to continue PT to become  stronger.    Evaluation: Patient had a fall on the right knee October 2023. XR showed no fracture. Patient had MRI Jan 2024 to show right knee with Complex degenerative tearing of the anterior horn and body of the lateral meniscus with substance loss of the anterior horn. Patient underwent right knee arthroscopy March 2024. Patient chose not to do Rehab post-op. Since then, patient states the right knee feels like popping, stiff, weakness, and buckling. Patient saw MD in November 2024; MD referred to OPPT.  PERTINENT HISTORY:  Left RTC Repair 2024 Jan 2023 Acute tear of lateral meniscus- right    PAIN:  Are you having pain? Yes: NPRS scale: 3-4/10 Pain location: diffuse around right knee  Pain description: achy, sharp Aggravating factors: turning on the knee, walking on uneven surface Relieving factors: rest  PRECAUTIONS: None  RED FLAGS: None   WEIGHT BEARING RESTRICTIONS: No  FALLS:  Has patient fallen in last 6 months? No  LIVING ENVIRONMENT: Lives with: lives with their family Lives in: House/apartment Stairs: Yes: External: patient does not need to use stairs to get in  steps; none Has following equipment at home: Single point cane How many minutes per week do you regularly exercise? 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  PATIENT SURVEYS:  Lower Extremity Functional Score: 27 / 80 = 33.8 %  09/05/23 Lower Extremity Functional Score: 28 / 80 = 35.0 %    SCREENING FOR RED FLAGS: Bowel or  bladder incontinence: No Spinal tumors: No Cauda equina syndrome: No Compression fracture: No Abdominal aneurysm: No  COGNITION: Overall cognitive status: Within functional limits for tasks assessed  POSTURE: No Significant postural limitations      FUNCTIONAL TESTS:  5 times sit to stand: 25s with moderate UE push from the chair  20-29 years: 6.0  1.4 seconds 30-39 years: 6.1  1.4 seconds 40-49 years: 7.6  1.8 seconds 50-59 years: 7.7  2.6 seconds 60-69 years: 8.4  0.0 seconds (female), 12.7  1.8 seconds (female) 70-79 years: 11.6  3.4 seconds (female), 13.0  4.8 seconds (female) 80-89 years: 16.7  4.5 seconds (female), 17.2  5.5 seconds (female) 90+ years: 19.5  2.3 seconds (female), 22.9  9.6 seconds (female)  The Minimal Clinically Important Difference (MCID) is 2.3 seconds.  A score of 16 seconds or less indicates that the participant is not likely to fall.  A score of more than 16 seconds indicates a higher risk of falls  As per American Physical Therapy Association (APTA) website   09/05/23  5 STS: 21.87 seconds with NO upper extremity push from chair  GAIT ANALYSIS: Distance walked: 44feet  Assistive device utilized: None Level of assistance: Complete Independence Comments: moderate antalgia on  right knee; right knee maintained in flexion   SENSATION: [facility]    LOWER EXTREMITY MMT:    MMT Right eval Left eval Right  09/05/23  Hip flexion 3+/5 4-/5 3+/5  Hip extension     Hip abduction     Hip adduction     Hip internal rotation     Hip external rotation     Knee flexion 3-/5 4-/5 3/5  Knee extension 3-/5 4-/5 3/5  Ankle dorsiflexion 3+/5 4-/5 3+/5  Ankle plantarflexion     Ankle inversion     Ankle eversion      (Blank rows = not tested)  LOWER EXTREMITY ROM:     Passive  Right eval Right 09/05/23  Hip flexion    Hip extension    Hip abduction    Hip adduction    Hip internal rotation    Hip external rotation    Knee flexion 0-85 pain  0-92  Knee extension (6) (2)  Ankle dorsiflexion    Ankle plantarflexion    Ankle inversion    Ankle eversion     (Blank rows = not tested)  LOWER EXTREMITY SPECIAL TESTS   Patellafemoral grind test: positive right    PALPATION: Moderate tenderness to palpation right patella, inferior patella  --------------------------------------------------------------------------------------------- TODAY'S TREATMENT:                                                                                                                              DATE:    09/08/23 Seated  LAQs with 5# 2x10  Single leg press with 20# Standing  Step up 4 + Theraband ext with G theraband    Step ups on 4 step in staircase   Hip flexor stretch in staircase  Step ups 8 step- NT  Bike L2 cool down    09/05/23 PT Progress note tests & measures  08/25/23 Supine  Manual therapy to right lower extremity including: patellar mobilizations grade 2-3, soft tissue mobilization to knee  Seated  Single leg press with 20#, as tolerated Standing  Weighted sled pushes x4 laps  Forward wt shift+ medi ball on 4 step  Forward wt shift+ medi ball on 4 step + step up  Knee flexion with 3# 2x10  4 step downs     08/19/23 Bike seat 7 5 minutes  Standing: with 1-no UE assist   Heel raise/toe raise 15X each knee flexion stretch 12 box 10X10 Hamstring stretch with 12 step 3X30 with OP   Knee flexion Rt 15X  Hip abduction bil 15X each  Hip extension 15X each  Forward lunges 15X each onto 6 step no UE  Alternating march 15X each   Tandem stance 30 X2 each LE lead  SLS each LE max 5 Rt, 12 Lt  5 trials each  6 forward step ups no UE 10X each 1 UE Rt, no UE LT  6 lateral step ups 10X each bil UE assist   08/12/23  Prone Quadriceps Stretch with Strap   Manual therapy to knee in prone including: PROM, hip flexor manual stretch Prone knee extension stretch  Supine Hamstring Stretch with Strap   Small Range  Straight Leg Raise   Supine Bridge   08/04/23 NuStep Warm Up L3 Supine  Manual therapy to left knee including: knee extension mobilizations grade 2-3, prolonged stretch, instrument-assisted soft tissue mobilization to patellar tendon   DKTC with Green physioball x10  Hip bridges with G physioball 2x10  HS bridges with G physioball 2x10   SAQs with 3#, #6 x10  07/31/23 Supine  Manual therapy to left knee including: knee extension mobilizations grade 2-3, prolonged stretch   SAQs with 2# x10 Seated  Single leg press machine with 10# 2x10  Bike L3 cool-down   07/29/23 Supine  Manual therapy to left knee including: knee extension mobilizations grade 2-3, prolonged stretch   SAQs with 2# 2x10  LAQs with 2# 2x10 Gait Training with single point cane, 2 pt step through     PATIENT EDUCATION:  Education details: activity modification, pain management  Person educated: Patient Education method: Explanation Education comprehension: verbalized understanding  HOME EXERCISE PROGRAM:  Access Code: [email or web address] URL: [email or web address] Date: 08/25/2023 Prepared by: Deward Ming  Exercises - Prone Quadriceps Stretch with Strap  - 1-2 x daily - 2 sets - 30 seconds hold - Supine Hamstring Stretch with Strap  - 1-2 x daily - 2 sets - 30 seconds  hold - Small Range Straight Leg Raise  - 1-2 x daily - 2 sets - 10 reps - Supine Bridge  - 1-2 x daily - 2 sets - 10 reps - Forward Step Up  - 2 x daily - 1-2 x weekly - 2 sets - 10 reps  ----------------------------------------------------------------------------------  ASSESSMENT:  CLINICAL IMPRESSION: Today: PT took patient through step up progression to facilitate ADL completion/ stair negotiation at home. Patient reports having troubles with steps up @ home. PT attempted 8 step ups but patient did no tolerate due to knee pain; other exercises were tolerated well today with no increase in pain.  The patient will benefit from  PT to address the limitations/impairments listed to return to their prior level of function in the domains of activity and participation.   Progress Note 09/05/23: Patient has shown overall improvements in Lower Extremity Functional Scale (LEFS) up by 1 point, pain down to 3/10, strength up by  grade, and knee active range of motion into flexion/extension. Patient still presents with limitations in: knee full active range of motion, strength, pain levels preventing pain-free ADL completion, community ambulation, stair negotiation. . Patient has completed (9) visits since the initial evaluation and has met 2/6 stated rehab goal. At this moment, patient will continue to benefit from PT 1x/ week for 6 weeks to return to prior level of function.     Eval: Patient is a 53 y.o. y.o. female who was seen today for physical therapy evaluation and treatment for difficulty walking, muscle weakness due to right knee pain . Patient presents to PT with the following objective impairments: Abnormal gait, decreased activity tolerance, decreased endurance, difficulty walking, decreased ROM, decreased strength, improper body mechanics, and pain. These impairments limit the patient in activities such as carrying, lifting, bending, standing, squatting, stairs, locomotion level, and caring for others. These impairments also limit the patient in participation such as meal prep, cleaning, laundry, interpersonal relationship, driving, shopping, community activity, and yard work. The patient will benefit from PT to address the limitations/impairments  listed below to return to their prior level of function in the domains of activity and participation.   PERSONAL FACTORS: Time since onset of injury/illness/exacerbation are also affecting patient's functional outcome.   REHAB POTENTIAL: Good  CLINICAL DECISION MAKING: Stable/uncomplicated  EVALUATION COMPLEXITY:  Low  --------------------------------------------------------------------------------------------- GOALS: Goals reviewed with patient? No  SHORT TERM GOALS: Target date: 08/06/2023     1.  Patient will complete the 5 times sit to stand:    within 22s to demonstrate an improvement in ADL completion, stair negotiation, household/community ambulation, and self-care Baseline: 25 Goal status: goal met   2. Patient will be independent with a basic stretching/strengthening HEP  Baseline:  Goal status: goal met    LONG TERM GOALS: Target date: 08/27/2023  --> 10/07/13 new date     Patient will score a >/= 40/80 on the LEFS  to demonstrate a Minimally Clinically Importance Difference (MCID) in ADL completion, home/community ambulation, and lifting/bending/squatting.  Baseline:  Goal status: partially met  2. Patient will complete the 5 times sit to stand:  within 19s to demonstrate an improvement in ADL completion, stair negotiation, household/community ambulation, and self-care Baseline: 25 Goal status: partially met  3.  Patient will be independent with a comprehensive strengthening HEP  Baseline:  Goal status: partially met  4. Patient will be able to demonstrate right knee extension active range of motion to [facility] degrees to facilitate ADL completion, lifting, squatting. Baseline: (6) --> (2)  Goal status: mostly met  --------------------------------------------------------------------------------------------- PLAN:  PT FREQUENCY: 1x/week   PT DURATION: 6 weeks   PLANNED INTERVENTIONS: 97110-Therapeutic exercises, 97530- Therapeutic activity, 97112- Neuromuscular re-education, 97535- Self Care, [id number]- Manual therapy, [phone]- Gait training, Patient/Family education, Balance training, Stair training, Taping, Dry Needling, Joint mobilization, Joint manipulation, Spinal manipulation, Spinal mobilization, DME instructions, Cryotherapy, and Moist heat.  PLAN FOR NEXT SESSION:  Continue  to progress lower extremity strength, active range of motion.  Deward Ming, PT 09/08/2023, 10:59 AM

## 2023-09-17 ENCOUNTER — Inpatient Hospital Stay: Payer: 59 | Attending: Hematology

## 2023-09-17 DIAGNOSIS — Z801 Family history of malignant neoplasm of trachea, bronchus and lung: Secondary | ICD-10-CM | POA: Diagnosis not present

## 2023-09-17 DIAGNOSIS — D509 Iron deficiency anemia, unspecified: Secondary | ICD-10-CM | POA: Diagnosis not present

## 2023-09-17 DIAGNOSIS — F17210 Nicotine dependence, cigarettes, uncomplicated: Secondary | ICD-10-CM | POA: Insufficient documentation

## 2023-09-17 DIAGNOSIS — G8929 Other chronic pain: Secondary | ICD-10-CM | POA: Insufficient documentation

## 2023-09-17 DIAGNOSIS — Z800 Family history of malignant neoplasm of digestive organs: Secondary | ICD-10-CM | POA: Diagnosis not present

## 2023-09-17 DIAGNOSIS — D500 Iron deficiency anemia secondary to blood loss (chronic): Secondary | ICD-10-CM

## 2023-09-17 DIAGNOSIS — M25561 Pain in right knee: Secondary | ICD-10-CM | POA: Insufficient documentation

## 2023-09-17 DIAGNOSIS — D751 Secondary polycythemia: Secondary | ICD-10-CM | POA: Insufficient documentation

## 2023-09-17 DIAGNOSIS — Z8042 Family history of malignant neoplasm of prostate: Secondary | ICD-10-CM | POA: Insufficient documentation

## 2023-09-17 DIAGNOSIS — Z803 Family history of malignant neoplasm of breast: Secondary | ICD-10-CM | POA: Insufficient documentation

## 2023-09-17 DIAGNOSIS — D563 Thalassemia minor: Secondary | ICD-10-CM | POA: Diagnosis not present

## 2023-09-17 DIAGNOSIS — Z860100 Personal history of colon polyps, unspecified: Secondary | ICD-10-CM | POA: Insufficient documentation

## 2023-09-17 LAB — CBC WITH DIFFERENTIAL/PLATELET
Abs Immature Granulocytes: 0.02 10*3/uL (ref 0.00–0.07)
Basophils Absolute: 0.1 10*3/uL (ref 0.0–0.1)
Basophils Relative: 1 %
Eosinophils Absolute: 0.1 10*3/uL (ref 0.0–0.5)
Eosinophils Relative: 1 %
HCT: 45.6 % (ref 36.0–46.0)
Hemoglobin: 14.6 g/dL (ref 12.0–15.0)
Immature Granulocytes: 0 %
Lymphocytes Relative: 34 %
Lymphs Abs: 3.4 10*3/uL (ref 0.7–4.0)
MCH: 26.7 pg (ref 26.0–34.0)
MCHC: 32 g/dL (ref 30.0–36.0)
MCV: 83.4 fL (ref 80.0–100.0)
Monocytes Absolute: 0.7 10*3/uL (ref 0.1–1.0)
Monocytes Relative: 7 %
Neutro Abs: 5.5 10*3/uL (ref 1.7–7.7)
Neutrophils Relative %: 57 %
Platelets: 300 10*3/uL (ref 150–400)
RBC: 5.47 MIL/uL — ABNORMAL HIGH (ref 3.87–5.11)
RDW: 13.2 % (ref 11.5–15.5)
WBC: 9.8 10*3/uL (ref 4.0–10.5)
nRBC: 0 % (ref 0.0–0.2)

## 2023-09-17 LAB — IRON AND TIBC
Iron: 131 ug/dL (ref 28–170)
Saturation Ratios: 34 % — ABNORMAL HIGH (ref 10.4–31.8)
TIBC: 386 ug/dL (ref 250–450)
UIBC: 255 ug/dL

## 2023-09-17 LAB — FERRITIN: Ferritin: 52 ng/mL (ref 11–307)

## 2023-09-18 ENCOUNTER — Encounter: Payer: Self-pay | Admitting: Nutrition

## 2023-09-18 ENCOUNTER — Encounter: Payer: 59 | Attending: Internal Medicine | Admitting: Nutrition

## 2023-09-18 VITALS — Ht 61.0 in | Wt 186.2 lb

## 2023-09-18 DIAGNOSIS — I10 Essential (primary) hypertension: Secondary | ICD-10-CM | POA: Insufficient documentation

## 2023-09-18 DIAGNOSIS — E66812 Obesity, class 2: Secondary | ICD-10-CM | POA: Diagnosis present

## 2023-09-18 DIAGNOSIS — E118 Type 2 diabetes mellitus with unspecified complications: Secondary | ICD-10-CM | POA: Insufficient documentation

## 2023-09-18 NOTE — Patient Instructions (Signed)
Goals  Start meal planning and meal prepping. Will start gong to the [facility] twice a week starting next week. Do some chair yoga in the mean time. Reduce Lantus to 40 units at night Reduce Lispro to 6 -12 units with meals

## 2023-09-18 NOTE — Progress Notes (Signed)
Medical Nutrition Therapy  Appointment Start time:  1300  Appointment End time:  1400  Primary concerns today: DM Type 2 Obesity  Referral diagnosis: E11.8, DE66.9 Preferred learning style: No preference  Learning readiness: Ready, change in progress    NUTRITION ASSESSMENT  53 yr old bfemale referred for Type 2 DM and Obesity. Sees Ronny Bacon, FNP PCP Dr. Mayford Knife. Doesn't like chicken or seafood. Currently on 50 units of Lantus and 12-18 units of LIspro with meals. Dexcom shows A1C est 6.2%. TIR 93%. 8% TAR, 2% Below range.  >>>Discussed with Ronny Bacon about Dexcom readings. Her verbal order is to reduce Lantus to 40 units and start lispro 6-12 units with meals. Ms. Chinchilla verbalized understanding.>>>>  She is eating 2 meals per day and drinks Glucerna for breakfast. Is financially stressed and has a hard time making her food stretch for the month. Sees a therapist; has anxiety and depression and wants to see different psychiatrist.  Doesn't like seafood or chicken.  She is willing to work on Lifestyle Medicine to improve her Dm and chronic diseases.  Clinical Medical Hx:  Past Medical History:  Diagnosis Date   Abnormal Pap smear of cervix    Anemia    issue with red blood cells being small   Anxiety    Arthritis    Carpal tunnel syndrome    right   Depression    Diabetes mellitus without complication (HCC)    DVT (deep venous thrombosis) (HCC) 03/2021   left arm   GERD (gastroesophageal reflux disease)    High blood pressure    History of hiatal hernia    Left breast mass    Pre-diabetes     Medications:  Current Outpatient Medications on File Prior to Visit  Medication Sig Dispense Refill   ACCU-CHEK GUIDE test strip Use 1 each to check blood glucose four times daily 300 each 1   Accu-Chek Softclix Lancets lancets 1 each by Other route 4 (four) times daily. 300 each 1   acetaminophen (TYLENOL) 325 MG tablet Take 650 mg by mouth every 6 (six) hours as  needed for moderate pain.     ARIPiprazole (ABILIFY) 5 MG tablet Take 5 mg by mouth daily.     atenolol (TENORMIN) 50 MG tablet Take 50 mg by mouth daily.     baclofen (LIORESAL) 10 MG tablet Take 10 mg by mouth 3 (three) times daily as needed (Back pain).     Blood Glucose Monitoring Suppl (ACCU-CHEK GUIDE ME) w/Device KIT See admin instructions.     chlorthalidone (HYGROTON) 25 MG tablet Take 25 mg by mouth every morning.     Continuous Glucose Sensor (DEXCOM G7 SENSOR) MISC Use to continuously monitor glucose. Change sensor every 10 days. 3 each 2   DULoxetine (CYMBALTA) 60 MG capsule Take 60 mg by mouth daily in the afternoon.     famotidine (PEPCID) 40 MG tablet Take 1 tablet by mouth once daily 90 tablet 0   furosemide (LASIX) 20 MG tablet Take 1 tablet (20 mg total) by mouth daily as needed. 30 tablet 2   gabapentin (NEURONTIN) 300 MG capsule Take 300 mg by mouth at bedtime.     insulin glargine (LANTUS) 100 unit/mL SOPN Inject 50 Units into the skin at bedtime. 45 mL 0   Insulin lispro (HUMALOG JUNIOR KWIKPEN) 100 UNIT/ML Inject 12-18 Units into the skin 3 (three) times daily. 60 mL 0   lisinopril (ZESTRIL) 40 MG tablet Take 40 mg by mouth daily.  meclizine (ANTIVERT) 25 MG tablet Take 25 mg by mouth 3 (three) times daily as needed for dizziness.     meloxicam (MOBIC) 15 MG tablet Take 1 tablet (15 mg total) by mouth daily. 60 tablet 0   pantoprazole (PROTONIX) 40 MG tablet Take 1 tablet (40 mg total) by mouth 2 (two) times daily before a meal. 60 tablet 2   Semaglutide,0.25 or 0.5MG /DOS, (OZEMPIC, 0.25 OR 0.5 MG/DOSE,) 2 MG/1.5ML SOPN Inject 0.25 mg into the skin once a week.     spironolactone (ALDACTONE) 25 MG tablet Take 12.5 mg by mouth daily.     sucralfate (CARAFATE) 1 g tablet Take 1 g by mouth daily as needed (Acid reflux).     traZODone (DESYREL) 50 MG tablet Take 50-100 mg by mouth at bedtime.     No current facility-administered medications on file prior to visit.     Labs:  Lab Results  Component Value Date   HGBA1C 7.4 (A) 12/25/2022   Decom est A1C 6.2%.  Notable Signs/Symptoms: None  Lifestyle & Dietary Hx LIves by herself.  Estimated daily fluid intake: 60 oz Supplements: Sleep: poor sleep- up and down. Is on trazadone Stress / self-care: anxiety and depression Current average weekly physical activity: chair exercises and will start [employer] twice a week.  24-Hr Dietary Recall First Meal: Glucerna once a day Lunch and Dinner- usually leftovers. Cooks at home.  Estimated Energy Needs Calories: 1200 Carbohydrate: 135g Protein: 90g Fat: 33g   NUTRITION DIAGNOSIS  NB-1.1 Food and nutrition-related knowledge deficit As related to Diabetes Type 2.  As evidenced by A1C 7.3%.   NUTRITION INTERVENTION  Nutrition education (E-1) on the following topics:  Nutrition and Diabetes education provided on My Plate, CHO counting, meal planning, portion sizes, timing of meals, avoiding snacks between meals unless having a low blood sugar, target ranges for A1C and blood sugars, signs/symptoms and treatment of hyper/hypoglycemia, monitoring blood sugars, taking medications as prescribed, benefits of exercising 30 minutes per day and prevention of complications of DM.  Lifestyle Medicine  - Whole Food, Plant Predominant Nutrition is highly recommended: Eat Plenty of vegetables, Mushrooms, fruits, Legumes, Whole Grains, Nuts, seeds in lieu of processed meats, processed snacks/pastries red meat, poultry, eggs.    -It is better to avoid simple carbohydrates including: Cakes, Sweet Desserts, Ice Cream, Soda (diet and regular), Sweet Tea, Candies, Chips, Cookies, Store Bought Juices, Alcohol in Excess of  1-2 drinks a day, Lemonade,  Artificial Sweeteners, Doughnuts, Coffee Creamers, "Sugar-free" Products, etc, etc.  This is not a complete list.....  Exercise: If you are able: 30 -60 minutes a day ,4 days a week, or 150 minutes a week.  The longer the  better.  Combine stretch, strength, and aerobic activities.  If you were told in the past that you have high risk for cardiovascular diseases, you may seek evaluation by your heart doctor prior to initiating moderate to intense exercise programs.  MyFullplateliving and meal planning   Handouts Provided Include  Lifestyle Medicine Full Plate Living   Learning Style & Readiness for Change Teaching method utilized: Visual & Auditory  Demonstrated degree of understanding via: Teach Back  Barriers to learning/adherence to lifestyle change: anxiety and depression  Goals Established by Pt Goals  Start meal planning and meal prepping. Will start gong to the [facility] twice a week starting next week. Do some chair yoga in the mean time. Reduce Lantus to 40 units at night Reduce Lispro to 6 -12 units with meals  MONITORING & EVALUATION Dietary intake, weekly physical activity, and weight in 1 month.  Next Steps  Patient is to work on meal planning.Marland Kitchen

## 2023-09-19 ENCOUNTER — Ambulatory Visit (HOSPITAL_COMMUNITY): Payer: 59

## 2023-09-19 DIAGNOSIS — R262 Difficulty in walking, not elsewhere classified: Secondary | ICD-10-CM | POA: Diagnosis not present

## 2023-09-19 DIAGNOSIS — M6281 Muscle weakness (generalized): Secondary | ICD-10-CM | POA: Diagnosis not present

## 2023-09-19 DIAGNOSIS — M25561 Pain in right knee: Secondary | ICD-10-CM | POA: Diagnosis not present

## 2023-09-19 NOTE — Therapy (Signed)
OUTPATIENT PHYSICAL THERAPY TREATMENT  Progress Note 11/20-1/1-/25   Patient Name: Linlee Cromie MRN: [id number] DOB:[birth year], 53 y.o., female Today's Date: 09/19/2023  END OF SESSION:   PT End of Session - 09/19/23 1017     Visit Number 11    Number of Visits 14    Date for PT Re-Evaluation 10/08/23    Authorization Type [employer] Dual    Authorization Time Period no auth    PT Start Time 1017    PT Stop Time 1057    PT Time Calculation (min) 40 min    Activity Tolerance Patient tolerated treatment well    Behavior During Therapy WFL for tasks assessed/performed              Past Medical History:  Diagnosis Date   Abnormal Pap smear of cervix    Anemia    issue with red blood cells being small   Anxiety    Arthritis    Carpal tunnel syndrome    right   Depression    Diabetes mellitus without complication (HCC)    DVT (deep venous thrombosis) (HCC) 03/2021   left arm   GERD (gastroesophageal reflux disease)    High blood pressure    History of hiatal hernia    Left breast mass    Pre-diabetes    Past Surgical History:  Procedure Laterality Date   BIOPSY  02/05/2022   Procedure: BIOPSY;  Surgeon: Lanelle Bal, DO;  Location: [facility];  Service: Endoscopy;;   COLONOSCOPY  2022   Megan Straughan: 7 polyps removed, has to return in one year   COLONOSCOPY  09/2021   Megan Straughan: 12 polyps removed   ESOPHAGOGASTRODUODENOSCOPY (EGD) WITH PROPOFOL N/A 02/05/2022   Procedure: ESOPHAGOGASTRODUODENOSCOPY (EGD) WITH PROPOFOL;  Surgeon: Lanelle Bal, DO;  Location: [facility];  Service: Endoscopy;  Laterality: N/A;  2:00pm   KNEE ARTHROSCOPY Right 10/28/2022   Procedure: RIGHT KNEE ARTHROSCOPY, DEBRIDEMENT;  Surgeon: Cammy Copa, MD;  Location: [facility];  Service: Orthopedics;  Laterality: Right;   left shoulder arthroscopy  12/2020   SHOULDER ARTHROSCOPY WITH OPEN ROTATOR CUFF REPAIR AND DISTAL CLAVICLE ACROMINECTOMY Left  03/21/2022   Procedure: LEFT SHOULDER ARTHOSCOPY, DEBRIDEMENT,  MANIPULATION, ROTATOR CUFF REPAIR, ARTHROSCOPIC DISTAL CLAVICLE EXCISION;  Surgeon: Cammy Copa, MD;  Location: [facility];  Service: Orthopedics;  Laterality: Left;   TUBAL LIGATION     Patient Active Problem List   Diagnosis Date Noted   Palpitations 03/25/2023   Nicotine abuse 03/25/2023   Non-cardiac chest pain 03/25/2023   DOE (dyspnea on exertion) 03/25/2023   Acute lateral meniscus tear of right knee 11/24/2022   Arthritis of left acromioclavicular joint    Complete tear of left rotator cuff    Abdominal pain, epigastric 01/23/2022   Iron deficiency anemia 12/13/2021   Iron deficiency anemia due to chronic blood loss 12/13/2021   GERD (gastroesophageal reflux disease)    Cubital tunnel syndrome of both upper extremities 01/25/2020   High blood pressure    Depression    Carpal tunnel syndrome    Anxiety     PCP: Donetta Potts, MDPCP - General   REFERRING PROVIDER: Julieanne Cotton, PA-C   REFERRING DIAG: [phone] (ICD-10-CM) - Chronic pain of right knee   Rationale for Evaluation and Treatment: Rehabilitation  THERAPY DIAG:  Difficulty in walking, not elsewhere classified  Right knee pain, unspecified chronicity  Muscle weakness (generalized)  ONSET DATE: October 2023 --------------------------------------------------------------------------------------------- SUBJECTIVE:  SUBJECTIVE STATEMENT: Today: Patient reports 3/10 pain in right knee today. Has felt stiff with the cold   Progress Note 09/05/23: Patient reports since starting PT on 11/20, she feels as if she feels about a 50% improvement especially in here walking. Patient still reports a pressure in the knee when she steps up, cold weather, and  turning/twisting. Patient states pain remains about 3/10 daily with a nagging quality. Patient would like to continue PT to become stronger.    Evaluation: Patient had a fall on the right knee October 2023. XR showed no fracture. Patient had MRI Jan 2024 to show right knee with "Complex degenerative tearing of the anterior horn and body of the lateral meniscus with substance loss of the anterior horn." Patient underwent right knee arthroscopy March 2024. Patient chose not to do Rehab post-op. Since then, patient states the right knee feels like popping, stiff, weakness, and buckling. Patient saw MD in November 2024; MD referred to OPPT.  PERTINENT HISTORY:  Left RTC Repair 2024 Jan 2023 Acute tear of lateral meniscus- right    PAIN:  Are you having pain? Yes: NPRS scale: 3-4/10 Pain location: diffuse around right knee  Pain description: achy, sharp Aggravating factors: turning on the knee, walking on uneven surface Relieving factors: rest  PRECAUTIONS: None  RED FLAGS: None   WEIGHT BEARING RESTRICTIONS: No  FALLS:  Has patient fallen in last 6 months? No  LIVING ENVIRONMENT: Lives with: lives with their family Lives in: House/apartment Stairs: Yes: External: patient does not need to use stairs to get in  steps; none Has following equipment at home: Single point cane How many minutes per week do you regularly exercise? 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  PATIENT SURVEYS:  Lower Extremity Functional  Score: 27 / 80 = 33.8 %  09/05/23 Lower Extremity Functional Score: 28 / 80 = 35.0 %    SCREENING FOR RED FLAGS: Bowel or bladder incontinence: No Spinal tumors: No Cauda equina syndrome: No Compression fracture: No Abdominal aneurysm: No  COGNITION: Overall cognitive status: Within functional limits for tasks assessed  POSTURE: No Significant postural limitations      FUNCTIONAL TESTS:  5 times sit to stand: 25s with moderate UE push from the chair  20-29 years: 6.0  1.4 seconds 30-39 years: 6.1  1.4 seconds 40-49 years: 7.6  1.8 seconds 50-59 years: 7.7  2.6 seconds 60-69 years: 8.4  0.0 seconds (female), 12.7  1.8 seconds (female) 70-79 years: 11.6  3.4 seconds (female), 13.0  4.8 seconds (female) 80-89 years: 16.7  4.5 seconds (female), 17.2  5.5 seconds (female) 90+ years: 19.5  2.3 seconds (female), 22.9  9.6 seconds (female)  The Minimal Clinically Important Difference (MCID) is 2.3 seconds.  A score of 16 seconds or less indicates that the participant is not likely to fall.  A score of more than 16 seconds indicates a higher risk of falls  As per [employer] Association (APTA) website   09/05/23  5 STS: 21.87 seconds with NO upper extremity push from chair  GAIT ANALYSIS: Distance walked: 47feet  Assistive device utilized: None Level of assistance:  Complete Independence Comments: moderate antalgia on right knee; right knee maintained in flexion   SENSATION: [facility]    LOWER EXTREMITY MMT:    MMT Right eval Left eval Right  09/05/23  Hip flexion 3+/5 4-/5 3+/5  Hip extension     Hip abduction     Hip adduction     Hip internal rotation     Hip external rotation     Knee flexion 3-/5 4-/5 3/5  Knee extension 3-/5 4-/5 3/5  Ankle dorsiflexion 3+/5 4-/5 3+/5  Ankle plantarflexion     Ankle inversion     Ankle eversion      (Blank rows = not tested)  LOWER EXTREMITY ROM:     Passive  Right eval Right 09/05/23  Hip flexion     Hip extension    Hip abduction    Hip adduction    Hip internal rotation    Hip external rotation    Knee flexion 0-85 pain 0-92  Knee extension (6) (2)  Ankle dorsiflexion    Ankle plantarflexion    Ankle inversion    Ankle eversion     (Blank rows = not tested)  LOWER EXTREMITY SPECIAL TESTS   Patellafemoral grind test: positive right    PALPATION: Moderate tenderness to palpation right patella, inferior patella  --------------------------------------------------------------------------------------------- TODAY'S TREATMENT:                                                                                                                              DATE:    09/19/23 Bike L3 Seated  Leg curl machine with 50# x10   Knee ext machine with 10#  Supine  Bilateral SAQs with 7.5# 2x10  Bilateral SLR with 1/2# 2x10 Standing  Knee flexion with 3# in // bars     09/08/23 Seated  LAQs with 5# 2x10  Single leg press with 20# Standing  Step up 4" + Theraband ext with G theraband    Step ups on 4" step in staircase   Hip flexor stretch in staircase  Step ups 8" step- NT  Bike L2 cool down    09/05/23 PT Progress note tests & measures    PATIENT EDUCATION:  Education details: activity modification, pain management  Person educated: Patient Education method: Explanation Education comprehension: verbalized understanding  HOME EXERCISE PROGRAM:  Access Code: [email or web address] URL: [email or web address] Date: 08/25/2023 Prepared by: Seymour Bars  Exercises - Prone Quadriceps Stretch with Strap  - 1-2 x daily - 2 sets - 30 seconds hold - Supine Hamstring Stretch with Strap  - 1-2 x daily - 2 sets - 30 seconds  hold - Small Range Straight Leg Raise  - 1-2 x daily - 2 sets - 10 reps - Supine Bridge  - 1-2 x daily - 2 sets - 10 reps - Forward Step Up  - 2 x daily - 1-2 x weekly - 2 sets - 10  reps  ----------------------------------------------------------------------------------  ASSESSMENT:  CLINICAL IMPRESSION:  Today: PT progressed the intensity of the resistance used during during lower extremity strengthening. Patient with  muscle fatigue end of session but no pain. The patient will benefit from PT to address the limitations/impairments listed to return to their prior level of function in the domains of activity and participation.     Progress Note 09/05/23: Patient has shown overall improvements in Lower Extremity Functional Scale (LEFS) up by 1 point, pain down to 3/10, strength up by  grade, and knee active range of motion into flexion/extension. Patient still presents with limitations in: knee full active range of motion, strength, pain levels preventing pain-free ADL completion, community ambulation, stair negotiation. . Patient has completed (9) visits since the initial evaluation and has met 2/6 stated rehab goal. At this moment, patient will continue to benefit from PT 1x/ week for 6 weeks to return to prior level of function.     Eval: Patient is a 53 y.o. y.o. female who was seen today for physical therapy evaluation and treatment for difficulty walking, muscle weakness due to right knee pain . Patient presents to PT with the following objective impairments: Abnormal gait, decreased activity tolerance, decreased endurance, difficulty walking, decreased ROM, decreased strength, improper body mechanics, and pain. These impairments limit the patient in activities such as carrying, lifting, bending, standing, squatting, stairs, locomotion level, and caring for others. These impairments also limit the patient in participation such as meal prep, cleaning, laundry, interpersonal relationship, driving, shopping, community activity, and yard work. The patient will benefit from PT to address the limitations/impairments listed below to return to their prior level of function in the  domains of activity and participation.   PERSONAL FACTORS: Time since onset of injury/illness/exacerbation are also affecting patient's functional outcome.   REHAB POTENTIAL: Good  CLINICAL DECISION MAKING: Stable/uncomplicated  EVALUATION COMPLEXITY: Low  --------------------------------------------------------------------------------------------- GOALS: Goals reviewed with patient? No  SHORT TERM GOALS: Target date: 08/06/2023     1.  Patient will complete the 5 times sit to stand:    within 22s to demonstrate an improvement in ADL completion, stair negotiation, household/community ambulation, and self-care Baseline: 25 Goal status: goal met   2. Patient will be independent with a basic stretching/strengthening HEP  Baseline:  Goal status: goal met    LONG TERM GOALS: Target date: 08/27/2023  --> 10/07/13 new date     Patient will score a >/= 40/80 on the LEFS  to demonstrate a Minimally Clinically Importance Difference (MCID) in ADL completion, home/community ambulation, and lifting/bending/squatting.  Baseline:  Goal status: partially met  2. Patient will complete the 5 times sit to stand:  within 19s to demonstrate an improvement in ADL completion, stair negotiation, household/community ambulation, and self-care Baseline: 25 Goal status: partially met  3.  Patient will be independent with a comprehensive strengthening HEP  Baseline:  Goal status: partially met  4. Patient will be able to demonstrate right knee extension active range of motion to [facility] degrees to facilitate ADL completion, lifting, squatting. Baseline: (6) --> (2)  Goal status: mostly met  --------------------------------------------------------------------------------------------- PLAN:  PT FREQUENCY: 1x/week   PT DURATION: 6 weeks   PLANNED INTERVENTIONS: 97110-Therapeutic exercises, 97530- Therapeutic activity, 97112- Neuromuscular re-education, 97535- Self Care, 16109- Manual therapy, [phone]-  Gait training, Patient/Family education, Balance training, Stair training, Taping, Dry Needling, Joint mobilization, Joint manipulation, Spinal manipulation, Spinal mobilization, DME instructions, Cryotherapy, and Moist heat.  PLAN FOR NEXT SESSION:  Continue to progress lower extremity strength, active range of motion.  Seymour Bars, PT  09/19/2023, 10:19 AM

## 2023-09-20 ENCOUNTER — Other Ambulatory Visit: Payer: Self-pay | Admitting: Nurse Practitioner

## 2023-09-20 DIAGNOSIS — Z794 Long term (current) use of insulin: Secondary | ICD-10-CM

## 2023-09-23 NOTE — Progress Notes (Unsigned)
[facility] [address], [place] [address]   CLINIC:  Medical Oncology/Hematology  PCP:  Donetta Potts, MD [address] [place] [address] [phone]   REASON FOR VISIT: Iron deficiency anemia, alpha thalassemia trait, and erythrocytosis.   PRIOR THERAPY: Iron tablets   CURRENT THERAPY: Intermittent IV iron (last on 12/18/2021 and 12/26/2021)  INTERVAL HISTORY:   Suzanne Lawrence 53 y.o. female returns for routine follow-up of  iron deficiency anemia.  She was last seen by Rojelio Brenner PA-C on 03/17/2023.  At today's visit, she reports feeling ***.  She denies any surgeries, hospitalizations, or major changes in her health status since her last visit.  She has not had any major menstrual bleeding since uterine ablation in March 2023, but had one episode "minor vaginal bleeding" in summer 2023.  *** ***No rectal bleeding or melena; she has dark green bowel movements from time to time.   She still has some residual fatigue with energy about 25***%, feels "sluggish and drowsy."  *** ***She has some chronic dyspnea on exertion from "being out of shape."   ***She reports dizziness/lightheadedness from vertigo, as well as some palpitations.   ***She denies any headaches, chest pain, or syncope.   ***She is not currently taking any iron supplement.  *** DAILY ferrous sulfate 325 since July 2024 ***  *** Smoking *** Sleep study?  She has 25***% energy and 75***% appetite.  Previously reported weight loss has resolved*** and she has started to regain some weight after being started on insulin.***   ASSESSMENT & PLAN:  1.  Iron deficiency anemia: - She previously had menorrhagia, s/p endometrial ablation in March 2023.  Since uterine ablation, she has only had occasional light spotting*** - She denies bright red blood per rectum or melena.*** - Colonoscopy (10/03/2021): Removal of 2 sessile polyps in the descending colon, 2 sessile polyps in the sigmoid  colon, 4 sessile polyps in the sigmoid colon, 3 sessile polyps in the rectosigmoid colon.  Pathology report shows all removed polyps to be hyperplastic with no dysplasia or malignancy. - EGD (02/05/2022): Small hiatal hernia, gastritis, gastric polyps x3 - She received IV Feraheme x2 (12/18/2021 and 12/26/2021).  She had mild INFUSION REACTION to first dose of Feraheme, was able to complete after receiving IV steroids.  Tolerated her second IV Feraheme infusion well with premedications. - Restarted ferrous sulfate 325 mg daily after visit in July 2024 *** - Most recent labs (09/17/2023): Hgb 14.6/HCT 45.6, ferritin 52, iron saturation 34% - PLAN: *** IV iron if symptomatic?  *** Recommend *** continue *** restarting iron with ferrous sulfate 325 mg daily.  Would consider IV iron if no improvement on oral iron.   - CBC/D and iron panel in 6 months ***   2.  Alpha thalassemia trait - Persistent microcytosis and elevated RBC, regardless of hemoglobin or iron levels. - Hemoglobin fractionation cascade was normal (03/15/2022), although this does not exclude alpha thalassemia trait.  Copper levels were normal. - Alpha thalassemia genotype was POSITIVE for alpha thalassemia trait - PLAN: We discussed diagnosis of alpha thalassemia trait (unaffected carrier state).     3.  Erythrocytosis - Intermittently elevated hemoglobin since July 2023 with Hgb 15.3/HCT 47.4 (03/08/2022) and Hgb 16.0/HCT 47.8 (08/07/2022) - Patient smokes 0.5 PPD cigarettes*** - No known history of sleep apnea, but she has been told that she snores. - No obvious carbon monoxide exposure, but she has gas heat at home, along with CO monitor.   -  Hematology workup (08/21/2022): Elevated blood carbon monoxide 11.7% (usually <9.9% in smokers) Mildly elevated erythropoietin 23.7 Negative JAK2, CALR, MPL - She may have some diuretic effect from her chlorthalidone which could be causing hemoconcentration.  Additionally, she has had severe  hyperglycemia for the past several months (blood sugars in the 400s), which could also be causing some diuretic effect. 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  Elevated RBC also from alpha thalassemia trait.   - PLAN: Previously referred for sleep study, but their office was unable to get in touch with patient.  Patient encouraged to call back to sleep center to schedule consultation.*** - Recommend smoking cessation *** - CBC in 6 months    4.  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  She had exposure to chemicals including heptane and toluene. - She is current active smoker, 1 pack every 2 to 3 days for 25 years.  (Does not qualify for LCS/LDCT chest due to pack-year history <20) - No family history of severe anemia or sickle cell disease.  Mother had lung cancer.  Father had prostate and colon cancer.  Maternal aunt had breast cancer.  Maternal and paternal cousins had breast cancer.  Maternal uncle had colon cancer.  PLAN SUMMARY: >> *** IV iron *** >> Labs in 6 months (CBC/D, ferritin, iron/TIBC) >> OFFICE visit 1 week after labs     REVIEW OF SYSTEMS: ***  Review of Systems  Constitutional:  Positive for fatigue. Negative for appetite change, chills, diaphoresis, fever and unexpected weight change.  HENT:   Negative for lump/mass and nosebleeds.   Eyes:  Negative for eye problems.  Respiratory:  Positive for shortness of breath (with exertion). Negative for cough and hemoptysis.   Cardiovascular:  Positive for palpitations. Negative for chest pain and leg swelling.  Gastrointestinal:  Positive for diarrhea and nausea. Negative for abdominal pain, blood in stool, constipation and vomiting.  Genitourinary:  Negative for hematuria.   Musculoskeletal:  Positive for back pain.  Skin: Negative.    Neurological:  Positive for numbness. Negative for dizziness, headaches and light-headedness.  Hematological:  Does not bruise/bleed easily.  Psychiatric/Behavioral:  Positive for sleep disturbance. Negative for depression. The patient is not nervous/anxious.      PHYSICAL EXAM:  ECOG PERFORMANCE STATUS: 1 - Symptomatic but completely ambulatory *** There were no vitals filed for this visit.  There were no vitals filed for this visit.  Physical Exam Constitutional:      Appearance: Normal appearance. She is obese.  Cardiovascular:     Heart sounds: Normal heart sounds.  Pulmonary:     Breath sounds: Normal breath sounds.  Neurological:     General: No focal deficit present.     Mental Status: Mental status is at baseline.  Psychiatric:        Behavior: Behavior normal. Behavior is cooperative.     PAST MEDICAL/SURGICAL HISTORY:  Past Medical History:  Diagnosis Date   Abnormal Pap smear of cervix    Anemia    issue with red blood cells being small   Anxiety    Arthritis    Carpal tunnel syndrome    right   Depression    Diabetes mellitus without complication (HCC)    DVT (deep venous thrombosis) (HCC) 03/2021   left arm   GERD (gastroesophageal reflux disease)    High blood pressure  History of hiatal hernia    Left breast mass    Pre-diabetes    Past Surgical History:  Procedure Laterality Date   BIOPSY  02/05/2022   Procedure: BIOPSY;  Surgeon: Lanelle Bal, DO;  [facility]: [facility];  Service: Endoscopy;;   COLONOSCOPY  2022   Megan Straughan: 7 polyps removed, has to return in one year   COLONOSCOPY  09/2021   Megan Straughan: 12 polyps removed   ESOPHAGOGASTRODUODENOSCOPY (EGD) WITH PROPOFOL N/A 02/05/2022   Procedure: ESOPHAGOGASTRODUODENOSCOPY (EGD) WITH PROPOFOL;  Surgeon: Lanelle Bal, DO;  Location: [facility];  Service: Endoscopy;  Laterality: N/A;  2:00pm   KNEE ARTHROSCOPY Right 10/28/2022   Procedure: RIGHT KNEE ARTHROSCOPY,  DEBRIDEMENT;  Surgeon: Cammy Copa, MD;  Location: [facility];  Service: Orthopedics;  Laterality: Right;   left shoulder arthroscopy  12/2020   SHOULDER ARTHROSCOPY WITH OPEN ROTATOR CUFF REPAIR AND DISTAL CLAVICLE ACROMINECTOMY Left 03/21/2022   Procedure: LEFT SHOULDER ARTHOSCOPY, DEBRIDEMENT,  MANIPULATION, ROTATOR CUFF REPAIR, ARTHROSCOPIC DISTAL CLAVICLE EXCISION;  Surgeon: Cammy Copa, MD;  Location: [facility];  Service: Orthopedics;  Laterality: Left;   TUBAL LIGATION      SOCIAL HISTORY:  Social History   Socioeconomic History   Marital status: Single    Spouse name: Not on file   Number of children: 3   Years of education: Not on file   Highest education level: Some college, no degree  Occupational History   Not on file  Tobacco Use   Smoking status: Every Day    Current packs/day: 0.50    Types: Cigarettes   Smokeless tobacco: Never   Tobacco comments:    Working on cutting down on smoking. Down to 8 cigarrettes a day now.  Vaping Use   Vaping status: Never Used  Substance and Sexual Activity   Alcohol use: Not Currently   Drug use: Not Currently   Sexual activity: Yes    Birth control/protection: None  Other Topics Concern   Not on file  Social History Narrative   Lives at home with 2 daughters   Right handed   Caffeine: about 2-3 per day    Social Drivers of Health   Financial Resource Strain: Low Risk  (10/25/2021)   Received from [facility], [facility]   Overall Financial Resource Strain (CARDIA)    Difficulty of Paying Living Expenses: Not hard at all  Food Insecurity: No Food Insecurity (06/26/2023)   Received from [facility]   Hunger Vital Sign    Worried About Running Out of Food in the Last Year: Never true    Ran Out of Food in the Last Year: Never true  Transportation Needs: No Transportation Needs (10/25/2021)   Received from [facility], [facility]   [facility] - Transportation    Lack of  Transportation (Medical): No    Lack of Transportation (Non-Medical): No  Physical Activity: Inactive (02/05/2023)   Received from [facility], [facility]   Exercise Vital Sign    Days of Exercise per Week: 0 days    Minutes of Exercise per Session: 0 min  Stress: Stress Concern Present (02/05/2023)   Received from [facility], [facility] of Occupational Health - Occupational Stress Questionnaire    Feeling of Stress : To some extent  Social Connections: Moderately Isolated (10/25/2021)   Received from [facility], [facility]   Social  Connection and Isolation Panel [NHANES]    Frequency of Communication with Friends and Family: More than three times a week    Frequency of Social Gatherings with Friends and Family: Never    Attends Religious Services: More than 4 times per year    Active Member of [employer] or Organizations: No    Attends Banker Meetings: Never    Marital Status: Never married  Intimate Partner Violence: Not At Risk (02/05/2023)   Received from [facility], [facility]   Humiliation, Afraid, Rape, and Kick questionnaire    Fear of Current or Ex-Partner: No    Emotionally Abused: No    Physically Abused: No    Sexually Abused: No    FAMILY HISTORY:  Family History  Problem Relation Age of Onset   Lung cancer Mother    Other Father        multiple issues, doesn't know history for sure   Prostate cancer Father    Stroke Father    Aneurysm Paternal Grandmother    Breast cancer Maternal Aunt    Prostate cancer Maternal Uncle    Alcoholism Maternal Uncle    Aneurysm Cousin        paternal side   Aneurysm Cousin        paternal side    Breast cancer Cousin        maternal side    High blood pressure Other        father's side    Cerebral aneurysm Other        cerebral, on father's side    Alcoholism Other        maternal side    Colon cancer Neg Hx     CURRENT MEDICATIONS:  Outpatient  Encounter Medications as of 09/24/2023  Medication Sig   ACCU-CHEK GUIDE test strip Use 1 each to check blood glucose four times daily   Accu-Chek Softclix Lancets lancets 1 each by Other route 4 (four) times daily.   acetaminophen (TYLENOL) 325 MG tablet Take 650 mg by mouth every 6 (six) hours as needed for moderate pain.   ARIPiprazole (ABILIFY) 5 MG tablet Take 5 mg by mouth daily.   atenolol (TENORMIN) 50 MG tablet Take 50 mg by mouth daily.   baclofen (LIORESAL) 10 MG tablet Take 10 mg by mouth 3 (three) times daily as needed (Back pain).   Blood Glucose Monitoring Suppl (ACCU-CHEK GUIDE ME) w/Device KIT See admin instructions.   chlorthalidone (HYGROTON) 25 MG tablet Take 25 mg by mouth every morning.   Continuous Glucose Sensor (DEXCOM G7 SENSOR) MISC Use to continuously monitor glucose. Change sensor every 10 days.   DULoxetine (CYMBALTA) 60 MG capsule Take 60 mg by mouth daily in the afternoon.   famotidine (PEPCID) 40 MG tablet Take 1 tablet by mouth once daily   furosemide (LASIX) 20 MG tablet Take 1 tablet (20 mg total) by mouth daily as needed.   gabapentin (NEURONTIN) 300 MG capsule Take 300 mg by mouth at bedtime.   Insulin lispro (HUMALOG JUNIOR KWIKPEN) 100 UNIT/ML Inject 12-18 Units into the skin 3 (three) times daily.   LANTUS SOLOSTAR 100 UNIT/ML Solostar Pen INJECT 50 UNITS SUBCUTANEOUSLY AT BEDTIME   lisinopril (ZESTRIL) 40 MG tablet Take 40 mg by mouth daily.   meclizine (ANTIVERT) 25 MG tablet Take 25 mg by mouth 3 (three) times daily as needed for dizziness.   meloxicam (MOBIC) 15 MG tablet Take 1 tablet (15 mg total) by mouth daily.  pantoprazole (PROTONIX) 40 MG tablet Take 1 tablet (40 mg total) by mouth 2 (two) times daily before a meal.   Semaglutide,0.25 or 0.5MG /DOS, (OZEMPIC, 0.25 OR 0.5 MG/DOSE,) 2 MG/1.5ML SOPN Inject 0.25 mg into the skin once a week.   spironolactone (ALDACTONE) 25 MG tablet Take 12.5 mg by mouth daily.   sucralfate (CARAFATE) 1 g tablet  Take 1 g by mouth daily as needed (Acid reflux).   traZODone (DESYREL) 50 MG tablet Take 50-100 mg by mouth at bedtime.   No facility-administered encounter medications on file as of 09/24/2023.    ALLERGIES:  Allergies  Allergen Reactions   Ferumoxytol Other (See Comments)    Hot flash, nausea, chest pressure    Hydrochlorothiazide Other (See Comments)     "urinary frequency"; was going every 20 minutes and was causing problems at work    LABORATORY DATA:  I have reviewed the labs as listed.  CBC    Component Value Date/Time   WBC 9.8 09/17/2023 1335   RBC 5.47 (H) 09/17/2023 1335   HGB 14.6 09/17/2023 1335   HCT 45.6 09/17/2023 1335   PLT 300 09/17/2023 1335   MCV 83.4 09/17/2023 1335   MCH 26.7 09/17/2023 1335   MCHC 32.0 09/17/2023 1335   RDW 13.2 09/17/2023 1335   LYMPHSABS 3.4 09/17/2023 1335   MONOABS 0.7 09/17/2023 1335   EOSABS 0.1 09/17/2023 1335   BASOSABS 0.1 09/17/2023 1335      Latest Ref Rng & Units 10/22/2022   12:33 PM 08/09/2022   12:58 PM 08/06/2022   12:00 AM  CMP  Glucose 70 - 99 mg/dL 99  161    BUN 6 - 20 mg/dL 9  9  6       Creatinine 0.44 - 1.00 mg/dL 0.96  0.45  0.8      Sodium 135 - 145 mmol/L 136  132    Potassium 3.5 - 5.1 mmol/L 3.8  3.9    Chloride 98 - 111 mmol/L 97  95  91      CO2 22 - 32 mmol/L 29  24    Calcium 8.9 - 10.3 mg/dL 9.2  9.3    AST 13 - 35   67      ALT 7 - 35 U/L   63         This result is from an external source.    DIAGNOSTIC IMAGING:  I have independently reviewed the relevant imaging and discussed with the patient.   WRAP UP:  All questions were answered. The patient knows to call the clinic with any problems, questions or concerns.  Medical decision making: Moderate***  Time spent on visit: I spent 20 minutes counseling the patient face to face. The total time spent in the appointment was 30 minutes and more than 50% was on counseling.  Carnella Guadalajara, PA-C  ***

## 2023-09-24 ENCOUNTER — Ambulatory Visit (HOSPITAL_COMMUNITY): Payer: 59

## 2023-09-24 ENCOUNTER — Encounter (HOSPITAL_COMMUNITY): Payer: Self-pay | Admitting: Hematology

## 2023-09-24 ENCOUNTER — Inpatient Hospital Stay (HOSPITAL_BASED_OUTPATIENT_CLINIC_OR_DEPARTMENT_OTHER): Payer: 59 | Admitting: Physician Assistant

## 2023-09-24 VITALS — BP 131/71 | HR 83 | Temp 98.1°F | Resp 18 | Wt 185.4 lb

## 2023-09-24 DIAGNOSIS — Z801 Family history of malignant neoplasm of trachea, bronchus and lung: Secondary | ICD-10-CM | POA: Diagnosis not present

## 2023-09-24 DIAGNOSIS — M25561 Pain in right knee: Secondary | ICD-10-CM | POA: Diagnosis not present

## 2023-09-24 DIAGNOSIS — D751 Secondary polycythemia: Secondary | ICD-10-CM

## 2023-09-24 DIAGNOSIS — Z803 Family history of malignant neoplasm of breast: Secondary | ICD-10-CM | POA: Diagnosis not present

## 2023-09-24 DIAGNOSIS — D500 Iron deficiency anemia secondary to blood loss (chronic): Secondary | ICD-10-CM | POA: Diagnosis not present

## 2023-09-24 DIAGNOSIS — Z860100 Personal history of colon polyps, unspecified: Secondary | ICD-10-CM | POA: Diagnosis not present

## 2023-09-24 DIAGNOSIS — F17210 Nicotine dependence, cigarettes, uncomplicated: Secondary | ICD-10-CM | POA: Diagnosis not present

## 2023-09-24 DIAGNOSIS — R262 Difficulty in walking, not elsewhere classified: Secondary | ICD-10-CM

## 2023-09-24 DIAGNOSIS — D563 Thalassemia minor: Secondary | ICD-10-CM | POA: Diagnosis not present

## 2023-09-24 DIAGNOSIS — G8929 Other chronic pain: Secondary | ICD-10-CM | POA: Diagnosis not present

## 2023-09-24 DIAGNOSIS — Z800 Family history of malignant neoplasm of digestive organs: Secondary | ICD-10-CM | POA: Diagnosis not present

## 2023-09-24 DIAGNOSIS — M6281 Muscle weakness (generalized): Secondary | ICD-10-CM

## 2023-09-24 DIAGNOSIS — D509 Iron deficiency anemia, unspecified: Secondary | ICD-10-CM | POA: Diagnosis not present

## 2023-09-24 MED ORDER — FERROUS SULFATE 325 (65 FE) MG PO TBEC: 325.0000 mg | DELAYED_RELEASE_TABLET | Freq: Every day | ORAL | 3 refills | Status: AC

## 2023-09-24 NOTE — Patient Instructions (Signed)
[facility] **VISIT SUMMARY & IMPORTANT INSTRUCTIONS **   You were seen today by Rojelio Brenner PA-C for your follow-up visit.    IRON DEFICIENCY ANEMIA: Your iron levels are sightly low.  You should start taking iron pill (ferrous sulfate 325 mg) daily.  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  However, you also have a normal copy of that same piece of DNA, so your body is able to compensate with plenty of healthy hemoglobin protein to make up for the abnormal protein.  ELEVATED HEMOGLOBIN Your elevated hemoglobin is likely due to your tobacco use.  Continue to work on quitting smoking! We will also send you for sleep study, because sleep apnea can also cause elevated hemoglobin.  Please call [facility] [phone] to schedule your first appointment.  FOLLOW-UP APPOINTMENT: Labs in 6 months with office visit after  ** Thank you for trusting me with your healthcare!  I strive to provide all of my patients with quality care at each visit.  If you receive a survey for this visit, I would be so grateful to you for taking the time to provide feedback.  Thank you in advance!  ~ Marielouise Amey                   Dr. Doreatha Massed   &   Rojelio Brenner, PA-C   - - - - - - - - - - - - - - - - - -    Thank you for choosing [facility] to provide your oncology and hematology care.  To afford each patient quality time with our provider, please arrive at least 15 minutes before your scheduled appointment time.   If you have a lab appointment with the Cancer Center please come in thru the Main Entrance and check in at the main information desk.  You need to re-schedule your appointment should you arrive 10 or more minutes late.  We strive  to give you quality time with our providers, and arriving late affects you and other patients whose appointments are after yours.  Also, if you no show three or more times for appointments you may be dismissed from the clinic at the providers discretion.     Again, thank you for choosing [facility].  Our hope is that these requests will decrease the amount of time that you wait before being seen by our physicians.       _____________________________________________________________  Should you have questions after your visit to [facility], please contact our office at [phone] and follow the prompts.  Our office hours are 8:00 a.m. and 4:30 p.m. Monday - Friday.  Please note that voicemails left after 4:00 p.m. may not be returned until the following business day.  We are closed weekends and major holidays.  You do have access to a nurse 24-7, just call the main number to the clinic [phone] and do not press any options, hold on the line and a nurse will answer the phone.    For prescription refill requests, have your pharmacy contact our office and allow 72 hours.

## 2023-09-24 NOTE — Therapy (Signed)
OUTPATIENT PHYSICAL THERAPY TREATMENT  PHYSICAL THERAPY DISCHARGE SUMMARY  Visits from Start of Care: 12  Current functional level related to goals / functional outcomes: See below   Remaining deficits: See below   Education / Equipment: See below   Patient agrees to discharge. Patient goals were met. Patient is being discharged due to meeting the stated rehab goals.    Patient Name: Suzanne Lawrence MRN: [id number] DOB:1971/06/14, 53 y.o., female Today's Date: 09/24/2023  END OF SESSION:   PT End of Session - 09/24/23 1305     Visit Number 12    Number of Visits 14    Date for PT Re-Evaluation 10/08/23    Authorization Type [employer] Dual    Authorization Time Period no auth    PT Start Time 0103    PT Stop Time 0143    PT Time Calculation (min) 40 min    Activity Tolerance Patient tolerated treatment well    Behavior During Therapy [facility] for tasks assessed/performed              Past Medical History:  Diagnosis Date   Abnormal Pap smear of cervix    Anemia    issue with red blood cells being small   Anxiety    Arthritis    Carpal tunnel syndrome    right   Depression    Diabetes mellitus without complication (HCC)    DVT (deep venous thrombosis) (HCC) 03/2021   left arm   GERD (gastroesophageal reflux disease)    High blood pressure    History of hiatal hernia    Left breast mass    Pre-diabetes    Past Surgical History:  Procedure Laterality Date   BIOPSY  02/05/2022   Procedure: BIOPSY;  Surgeon: Lanelle Bal, DO;  Location: [facility];  Service: Endoscopy;;   COLONOSCOPY  2022   Megan Straughan: 7 polyps removed, has to return in one year   COLONOSCOPY  09/2021   Megan Straughan: 12 polyps removed   ESOPHAGOGASTRODUODENOSCOPY (EGD) WITH PROPOFOL N/A 02/05/2022   Procedure: ESOPHAGOGASTRODUODENOSCOPY (EGD) WITH PROPOFOL;  Surgeon: Lanelle Bal, DO;  Location: [facility];  Service: Endoscopy;  Laterality: N/A;  2:00pm   KNEE ARTHROSCOPY  Right 10/28/2022   Procedure: RIGHT KNEE ARTHROSCOPY, DEBRIDEMENT;  Surgeon: Cammy Copa, MD;  Location: [facility];  Service: Orthopedics;  Laterality: Right;   left shoulder arthroscopy  12/2020   SHOULDER ARTHROSCOPY WITH OPEN ROTATOR CUFF REPAIR AND DISTAL CLAVICLE ACROMINECTOMY Left 03/21/2022   Procedure: LEFT SHOULDER ARTHOSCOPY, DEBRIDEMENT,  MANIPULATION, ROTATOR CUFF REPAIR, ARTHROSCOPIC DISTAL CLAVICLE EXCISION;  Surgeon: Cammy Copa, MD;  Location: [facility];  Service: Orthopedics;  Laterality: Left;   TUBAL LIGATION     Patient Active Problem List   Diagnosis Date Noted   Palpitations 03/25/2023   Nicotine abuse 03/25/2023   Non-cardiac chest pain 03/25/2023   DOE (dyspnea on exertion) 03/25/2023   Acute lateral meniscus tear of right knee 11/24/2022   Arthritis of left acromioclavicular joint    Complete tear of left rotator cuff    Abdominal pain, epigastric 01/23/2022   Iron deficiency anemia 12/13/2021   Iron deficiency anemia due to chronic blood loss 12/13/2021   GERD (gastroesophageal reflux disease)    Cubital tunnel syndrome of both upper extremities 01/25/2020   High blood pressure    Depression    Carpal tunnel syndrome    Anxiety     PCP: Donetta Potts, MDPCP - General  REFERRING PROVIDER: Julieanne Cotton, PA-C   REFERRING DIAG: [phone] (ICD-10-CM) - Chronic pain of right knee   Rationale for Evaluation and Treatment: Rehabilitation  THERAPY DIAG:  No diagnosis found.  ONSET DATE: October 2023 --------------------------------------------------------------------------------------------- SUBJECTIVE:                                                                                                                                                                                           SUBJECTIVE STATEMENT: Today: Patient reports felling happy with PT progress and would like to finish @ this time   Progress Note  09/05/23: Patient reports since starting PT on 11/20, she feels as if she feels about a 50% improvement especially in here walking. Patient still reports a pressure in the knee when she steps up, cold weather, and turning/twisting. Patient states pain remains about 3/10 daily with a nagging quality. Patient would like to continue PT to become stronger.    Evaluation: Patient had a fall on the right knee October 2023. XR showed no fracture. Patient had MRI Jan 2024 to show right knee with "Complex degenerative tearing of the anterior horn and body of the lateral meniscus with substance loss of the anterior horn." Patient underwent right knee arthroscopy March 2024. Patient chose not to do Rehab post-op. Since then, patient states the right knee feels like popping, stiff, weakness, and buckling. Patient saw MD in November 2024; MD referred to OPPT.  PERTINENT HISTORY:  Left RTC Repair 2024 Jan 2023 Acute tear of lateral meniscus- right    PAIN:  Are you having pain? Yes: NPRS scale: 3-4/10 Pain location: diffuse around right knee  Pain description: achy, sharp Aggravating factors: turning on the knee, walking on uneven surface Relieving factors: rest  PRECAUTIONS: None  RED FLAGS: None   WEIGHT BEARING RESTRICTIONS: No  FALLS:  Has patient fallen in last 6 months? No  LIVING ENVIRONMENT: Lives with: lives with their family Lives in: House/apartment Stairs: Yes: External: patient does not need to use stairs to get in  steps; none Has following equipment at home: Single point cane How many minutes per week do you regularly exercise? ; patient just walks around home/stores  OCCUPATION: On disability; is submitting request to work part-time on disability   PLOF: Independent  PATIENT GOALS: To walk pain free and attempt working part time   NEXT MD VISIT: TBD --------------------------------------------------------------------------------------------- OBJECTIVE:   DIAGNOSTIC  FINDINGS:   08/30/2022 IMPRESSION: Complex degenerative tearing of the anterior horn and body of the lateral meniscus with substance loss of the anterior horn.  PATIENT SURVEYS:  Lower Extremity Functional Score: 27 / 80 = 33.8 %  09/05/23 Lower Extremity Functional Score: 28 / 80 = 35.0 %  09/24/23 Lower Extremity Functional Score: 45 / 80 = 56.3 %    SCREENING FOR RED FLAGS: Bowel or bladder incontinence: No Spinal tumors: No Cauda equina syndrome: No Compression fracture: No Abdominal aneurysm: No  COGNITION: Overall cognitive status: Within functional limits for tasks assessed  POSTURE: No Significant postural limitations      FUNCTIONAL TESTS:  5 times sit to stand: 25s with moderate UE push from the chair  20-29 years: 6.0  1.4 seconds 30-39 years: 6.1  1.4 seconds 40-49 years: 7.6  1.8 seconds 50-59 years: 7.7  2.6 seconds 60-69 years: 8.4  0.0 seconds (female), 12.7  1.8 seconds (female) 70-79 years: 11.6  3.4 seconds (female), 13.0  4.8 seconds (female) 80-89 years: 16.7  4.5 seconds (female), 17.2  5.5 seconds (female) 90+ years: 19.5  2.3 seconds (female), 22.9  9.6 seconds (female)  The Minimal Clinically Important Difference (MCID) is 2.3 seconds.  A score of 16 seconds or less indicates that the participant is not likely to fall.  A score of more than 16 seconds indicates a higher risk of falls  As per American Physical Therapy Association (APTA) website   09/05/23  5 STS: 21.87 seconds with NO upper extremity push from chair 09/24/23  5 STS: 15.6 seconds with NO upper extremity push from chair   GAIT ANALYSIS: Distance walked: 28feet  Assistive device utilized: None Level of assistance: Complete Independence Comments: moderate antalgia on right knee; right knee maintained in flexion   SENSATION: [facility]/[place]    LOWER EXTREMITY MMT:    MMT Right eval Left eval Right  09/05/23  Hip flexion 3+/5 4-/5 3+/5  Hip extension     Hip abduction      Hip adduction     Hip internal rotation     Hip external rotation     Knee flexion 3-/5 4-/5 3/5  Knee extension 3-/5 4-/5 3/5  Ankle dorsiflexion 3+/5 4-/5 3+/5  Ankle plantarflexion     Ankle inversion     Ankle eversion      (Blank rows = not tested)  LOWER EXTREMITY ROM:     Passive  Right eval Right 09/05/23 Right  09/24/23  Hip flexion     Hip extension     Hip abduction     Hip adduction     Hip internal rotation     Hip external rotation     Knee flexion 0-85 pain 0-92 0-105  Knee extension (6) (2) (1)  Ankle dorsiflexion     Ankle plantarflexion     Ankle inversion     Ankle eversion      (Blank rows = not tested)  LOWER EXTREMITY SPECIAL TESTS   Patellafemoral grind test: positive right    PALPATION: Moderate tenderness to palpation right patella, inferior patella  --------------------------------------------------------------------------------------------- TODAY'S TREATMENT:  DATE:   09/24/23 PT Discharge  HEP- see below  09/19/23 Bike L3 Seated  Leg curl machine with 50# x10   Knee ext machine with 10#  Supine  Bilateral SAQs with 7.5# 2x10  Bilateral SLR with 1/2# 2x10 Standing  Knee flexion with 3# in // bars     09/08/23 Seated  LAQs with 5# 2x10  Single leg press with 20# Standing  Step up 4" + Theraband ext with G theraband    Step ups on 4" step in staircase   Hip flexor stretch in staircase  Step ups 8" step- NT  Bike L2 cool down    09/05/23 PT Progress note tests & measures    PATIENT EDUCATION:  Education details: activity modification, pain management  Person educated: Patient Education method: Explanation Education comprehension: verbalized understanding  HOME EXERCISE PROGRAM:  Access Code: [email or web address] URL: [email or web address] Date: 09/24/2023 Prepared by: Seymour Bars  Exercises - Prone Quadriceps Stretch with Strap  - 1 x daily - 2 sets - 30 seconds hold - Supine Hamstring Stretch with Strap  - 1 x daily - 2 sets - 30 seconds  hold - Small Range Straight Leg Raise  - 1 x daily - 2 sets - 10 reps - Seated Knee Extension with Resistance  - 1 x daily - 2 sets - 10 reps - Seated Hamstring Curls with Resistance  - 1 x daily - 2 sets - 10 reps - Forward Step Up  - 1 x daily - 2 sets - 10 reps - Hip Abduction with Resistance Loop  - 1 x daily - 2 sets - 10 reps   ----------------------------------------------------------------------------------  ASSESSMENT:  CLINICAL IMPRESSION: .Patient has shown overall improvements in lower extremity ROM, strength. Patient has completed (12) visits since the initial evaluation and has met 6/6 stated rehab goal. At this moment, PT discharge patient to HEP due to meeting the stated rehab goals..    Progress Note 09/05/23: Patient has shown overall improvements in Lower Extremity Functional Scale (LEFS) up by 1 point, pain down to 3/10, strength up by  grade, and knee active range of motion into flexion/extension. Patient still presents with limitations in: knee full active range of motion, strength, pain levels preventing pain-free ADL completion, community ambulation, stair negotiation. . Patient has completed (9) visits since the initial evaluation and has met 2/6 stated rehab goal. At this moment, patient will continue to benefit from PT 1x/ week for 6 weeks to return to prior level of function.     Eval: Patient is a 53 y.o. y.o. female who was seen today for physical therapy evaluation and treatment for difficulty walking, muscle weakness due to right knee pain . Patient presents to PT with the following objective impairments: Abnormal gait, decreased activity tolerance, decreased endurance, difficulty walking, decreased ROM, decreased strength, improper body mechanics, and pain. These impairments limit the patient  in activities such as carrying, lifting, bending, standing, squatting, stairs, locomotion level, and caring for others. These impairments also limit the patient in participation such as meal prep, cleaning, laundry, interpersonal relationship, driving, shopping, community activity, and yard work. The patient will benefit from PT to address the limitations/impairments listed below to return to their prior level of function in the domains of activity and participation.   PERSONAL FACTORS: Time since onset of injury/illness/exacerbation are also affecting patient's functional outcome.   REHAB POTENTIAL: Good  CLINICAL DECISION MAKING: Stable/uncomplicated  EVALUATION COMPLEXITY: Low  --------------------------------------------------------------------------------------------- GOALS: Goals reviewed with patient? No  SHORT  TERM GOALS: Target date: 08/06/2023     1.  Patient will complete the 5 times sit to stand:    within 22s to demonstrate an improvement in ADL completion, stair negotiation, household/community ambulation, and self-care Baseline: 25 Goal status: goal met   2. Patient will be independent with a basic stretching/strengthening HEP  Baseline:  Goal status: goal met    LONG TERM GOALS: Target date: 08/27/2023  --> 10/07/13 new date     Patient will score a >/= 40/80 on the LEFS  to demonstrate a Minimally Clinically Importance Difference (MCID) in ADL completion, home/community ambulation, and lifting/bending/squatting.  Baseline:  Goal status: met  2. Patient will complete the 5 times sit to stand:  within 19s to demonstrate an improvement in ADL completion, stair negotiation, household/community ambulation, and self-care Baseline: 25 Goal status: met  3.  Patient will be independent with a comprehensive strengthening HEP  Baseline:  Goal status:  met  4. Patient will be able to demonstrate right knee extension active range of motion to [facility] degrees to facilitate ADL  completion, lifting, squatting. Baseline: (6) --> (2)  Goal status: met  --------------------------------------------------------------------------------------------- PLAN:  PT FREQUENCY: 1x/week   PT DURATION: 6 weeks   PLANNED INTERVENTIONS: 97110-Therapeutic exercises, 97530- Therapeutic activity, 97112- Neuromuscular re-education, 97535- Self Care, 16109- Manual therapy, [phone]- Gait training, Patient/Family education, Balance training, Stair training, Taping, Dry Needling, Joint mobilization, Joint manipulation, Spinal manipulation, Spinal mobilization, DME instructions, Cryotherapy, and Moist heat.  PLAN FOR NEXT SESSION:  Continue to progress lower extremity strength, active range of motion.  Seymour Bars, PT 09/24/2023, 1:07 PM

## 2023-09-25 DIAGNOSIS — B070 Plantar wart: Secondary | ICD-10-CM | POA: Diagnosis not present

## 2023-10-01 ENCOUNTER — Ambulatory Visit (INDEPENDENT_AMBULATORY_CARE_PROVIDER_SITE_OTHER): Payer: 59 | Admitting: Nurse Practitioner

## 2023-10-01 ENCOUNTER — Encounter: Payer: 59 | Attending: Internal Medicine | Admitting: Nutrition

## 2023-10-01 ENCOUNTER — Encounter: Payer: Self-pay | Admitting: Nurse Practitioner

## 2023-10-01 ENCOUNTER — Encounter: Payer: Self-pay | Admitting: Nutrition

## 2023-10-01 VITALS — Ht 61.0 in | Wt 186.3 lb

## 2023-10-01 VITALS — BP 118/86 | HR 72 | Ht 61.0 in | Wt 186.2 lb

## 2023-10-01 DIAGNOSIS — E66812 Obesity, class 2: Secondary | ICD-10-CM | POA: Insufficient documentation

## 2023-10-01 DIAGNOSIS — E1165 Type 2 diabetes mellitus with hyperglycemia: Secondary | ICD-10-CM

## 2023-10-01 DIAGNOSIS — E118 Type 2 diabetes mellitus with unspecified complications: Secondary | ICD-10-CM | POA: Insufficient documentation

## 2023-10-01 DIAGNOSIS — Z794 Long term (current) use of insulin: Secondary | ICD-10-CM | POA: Diagnosis not present

## 2023-10-01 DIAGNOSIS — Z7985 Long-term (current) use of injectable non-insulin antidiabetic drugs: Secondary | ICD-10-CM

## 2023-10-01 DIAGNOSIS — I10 Essential (primary) hypertension: Secondary | ICD-10-CM

## 2023-10-01 MED ORDER — OZEMPIC (0.25 OR 0.5 MG/DOSE) 2 MG/1.5ML ~~LOC~~ SOPN: 0.5000 mg | PEN_INJECTOR | SUBCUTANEOUS | 1 refills | Status: DC

## 2023-10-01 MED ORDER — DEXCOM G7 SENSOR MISC: 6 refills | Status: DC

## 2023-10-01 MED ORDER — LANTUS SOLOSTAR 100 UNIT/ML ~~LOC~~ SOPN: 30.0000 [IU] | PEN_INJECTOR | Freq: Every day | SUBCUTANEOUS | 3 refills | Status: DC

## 2023-10-01 NOTE — Patient Instructions (Addendum)
 Goals  Eat 1 piece of fruit per meal Keep eating vegetables with lunch and dinner Keep working out at [employer] twice a week. Lose 1 lb per week.

## 2023-10-01 NOTE — Progress Notes (Signed)
 Medical Nutrition Therapy  Appointment Start time:  1300  Appointment End time:  1400  Primary concerns today: DM Type 2 Obesity  Referral diagnosis: E11.8, DE66.9 Preferred learning style: No preference  Learning readiness: Ready, change in progress    NUTRITION ASSESSMENT  53 yr old bfemale referred for Type 2 DM and Obesity. Sees Benton Rio, FNP. Reduced her Lantus  to 30 today. Stopped her meal time insulin . A1C down to 7.9%., down from 8%. Dexcom shows TIR 97%.  Is currently on Ozmepic. Eating a lot more vegetables, drinking water and going to the [facility] twice a week. PCP Dr. Trudy. Doesn't like chicken or seafood. Still on Ozmepic. Lost from 107 to 185.3 lbs. Eating three meals per day  Goals set previously: Goals  Start meal planning and meal prepping.-done Will start gong to the [facility] twice a week starting next week.-done Do some chair yoga in the mean time.-done Reduce Lantus  to 40 units at night- now reduced 30 units now. Reduce Lispro to 6 -12 units with meals-stopped it per Endocrinology.  Making excellent progress.  Clinical Medical Hx:  Past Medical History:  Diagnosis Date   Abnormal Pap smear of cervix    Anemia    issue with red blood cells being small   Anxiety    Arthritis    Carpal tunnel syndrome    right   Depression    Diabetes mellitus without complication (HCC)    DVT (deep venous thrombosis) (HCC) 03/2021   left arm   GERD (gastroesophageal reflux disease)    High blood pressure    History of hiatal hernia    Left breast mass    Pre-diabetes     Medications:  Current Outpatient Medications on File Prior to Visit  Medication Sig Dispense Refill   ACCU-CHEK GUIDE test strip Use 1 each to check blood glucose four times daily 300 each 1   Accu-Chek Softclix Lancets lancets 1 each by Other route 4 (four) times daily. 300 each 1   acetaminophen  (TYLENOL ) 325 MG tablet Take 650 mg by mouth every 6 (six) hours as needed for moderate pain.      ARIPiprazole  (ABILIFY ) 10 MG tablet Take 10 mg by mouth daily.     atenolol  (TENORMIN ) 50 MG tablet Take 50 mg by mouth daily.     baclofen  (LIORESAL ) 10 MG tablet Take 10 mg by mouth 3 (three) times daily as needed (Back pain).     Blood Glucose Monitoring Suppl (ACCU-CHEK GUIDE ME) w/Device KIT See admin instructions.     chlorthalidone (HYGROTON) 25 MG tablet Take 25 mg by mouth every morning.     Continuous Glucose Sensor (DEXCOM G7 SENSOR) MISC Use to continuously monitor glucose. Change sensor every 10 days. 3 each 6   DULoxetine  (CYMBALTA ) 60 MG capsule Take 60 mg by mouth daily in the afternoon.     famotidine  (PEPCID ) 40 MG tablet Take 1 tablet by mouth once daily 90 tablet 0   ferrous sulfate  325 (65 FE) MG EC tablet Take 1 tablet (325 mg total) by mouth daily with breakfast. 90 tablet 3   furosemide  (LASIX ) 20 MG tablet Take 1 tablet (20 mg total) by mouth daily as needed. 30 tablet 2   gabapentin  (NEURONTIN ) 300 MG capsule Take 300 mg by mouth at bedtime.     insulin  glargine (LANTUS  SOLOSTAR) 100 UNIT/ML Solostar Pen Inject 30 Units into the skin at bedtime. 30 mL 3   lisinopril  (ZESTRIL ) 40 MG tablet Take 40 mg by  mouth daily.     meclizine (ANTIVERT) 25 MG tablet Take 25 mg by mouth 3 (three) times daily as needed for dizziness.     pantoprazole  (PROTONIX ) 40 MG tablet Take 1 tablet (40 mg total) by mouth 2 (two) times daily before a meal. 60 tablet 2   Semaglutide ,0.25 or 0.5MG /DOS, (OZEMPIC , 0.25 OR 0.5 MG/DOSE,) 2 MG/1.5ML SOPN Inject 0.5 mg into the skin once a week. 4.5 mL 1   spironolactone  (ALDACTONE ) 25 MG tablet Take 12.5 mg by mouth daily.     traZODone  (DESYREL ) 50 MG tablet Take 50-100 mg by mouth at bedtime.     No current facility-administered medications on file prior to visit.    Labs:  Lab Results  Component Value Date   HGBA1C 7.4 (A) 12/25/2022   Decom est A1C 6.2%.  Notable Signs/Symptoms: None  Lifestyle & Dietary Hx LIves by  herself.  Estimated daily fluid intake: 60 oz Supplements: Sleep: poor sleep- up and down. Is on trazadone Stress / self-care: anxiety and depression Current average weekly physical activity: chair exercises and will start [employer] twice a week.  24-Hr Dietary Recall First Meal: Glucerna once a day Lunch and Dinner- usually leftovers. Cooks at home.  Estimated Energy Needs Calories: 1200 Carbohydrate: 135g Protein: 90g Fat: 33g   NUTRITION DIAGNOSIS  NB-1.1 Food and nutrition-related knowledge deficit As related to Diabetes Type 2.  As evidenced by A1C 7.3%.   NUTRITION INTERVENTION  Nutrition education (E-1) on the following topics:  Nutrition and Diabetes education provided on My Plate, CHO counting, meal planning, portion sizes, timing of meals, avoiding snacks between meals unless having a low blood sugar, target ranges for A1C and blood sugars, signs/symptoms and treatment of hyper/hypoglycemia, monitoring blood sugars, taking medications as prescribed, benefits of exercising 30 minutes per day and prevention of complications of DM.  Lifestyle Medicine  - Whole Food, Plant Predominant Nutrition is highly recommended: Eat Plenty of vegetables, Mushrooms, fruits, Legumes, Whole Grains, Nuts, seeds in lieu of processed meats, processed snacks/pastries red meat, poultry, eggs.    -It is better to avoid simple carbohydrates including: Cakes, Sweet Desserts, Ice Cream, Soda (diet and regular), Sweet Tea, Candies, Chips, Cookies, Store Bought Juices, Alcohol in Excess of  1-2 drinks a day, Lemonade,  Artificial Sweeteners, Doughnuts, Coffee Creamers, Sugar-free Products, etc, etc.  This is not a complete list.....  Exercise: If you are able: 30 -60 minutes a day ,4 days a week, or 150 minutes a week.  The longer the better.  Combine stretch, strength, and aerobic activities.  If you were told in the past that you have high risk for cardiovascular diseases, you may seek evaluation by your  heart doctor prior to initiating moderate to intense exercise programs.  MyFullplateliving and meal planning   Handouts Provided Include  Lifestyle Medicine Full Plate Living   Learning Style & Readiness for Change Teaching method utilized: Visual & Auditory  Demonstrated degree of understanding via: Teach Back  Barriers to learning/adherence to lifestyle change: anxiety and depression  Goals Established by Pt Goals  Eat 1 piece of fruit per meal Keep eating vegetables with lunch and dinner Keep working out at [employer] twice a week. Lose 1 lb per week.  MONITORING & EVALUATION Dietary intake, weekly physical activity, and weight in 3 month.  Next Steps  Patient is to work on meal planning.SABRA

## 2023-10-01 NOTE — Progress Notes (Signed)
 Endocrinology Follow Up Note       10/01/2023, 3:31 PM   Subjective:    Patient ID: Suzanne Lawrence, female    DOB: December 10, 1970.  Lewis Grivas is being seen in follow up after being seen in consultation for management of currently uncontrolled symptomatic diabetes requested by  Trudy Vaughn FALCON, MD.   Past Medical History:  Diagnosis Date   Abnormal Pap smear of cervix    Anemia    issue with red blood cells being small   Anxiety    Arthritis    Carpal tunnel syndrome    right   Depression    Diabetes mellitus without complication (HCC)    DVT (deep venous thrombosis) (HCC) 03/2021   left arm   GERD (gastroesophageal reflux disease)    High blood pressure    History of hiatal hernia    Left breast mass    Pre-diabetes     Past Surgical History:  Procedure Laterality Date   BIOPSY  02/05/2022   Procedure: BIOPSY;  Surgeon: Cindie Carlin POUR, DO;  Location: [facility];  Service: Endoscopy;;   COLONOSCOPY  2022   Megan Straughan: 7 polyps removed, has to return in one year   COLONOSCOPY  09/2021   Megan Straughan: 12 polyps removed   ESOPHAGOGASTRODUODENOSCOPY (EGD) WITH PROPOFOL  N/A 02/05/2022   Procedure: ESOPHAGOGASTRODUODENOSCOPY (EGD) WITH PROPOFOL ;  Surgeon: Cindie Carlin POUR, DO;  Location: [facility];  Service: Endoscopy;  Laterality: N/A;  2:00pm   KNEE ARTHROSCOPY Right 10/28/2022   Procedure: RIGHT KNEE ARTHROSCOPY, DEBRIDEMENT;  Surgeon: Addie Cordella Hamilton, MD;  Location: [facility];  Service: Orthopedics;  Laterality: Right;   left shoulder arthroscopy  12/2020   SHOULDER ARTHROSCOPY WITH OPEN ROTATOR CUFF REPAIR AND DISTAL CLAVICLE ACROMINECTOMY Left 03/21/2022   Procedure: LEFT SHOULDER ARTHOSCOPY, DEBRIDEMENT,  MANIPULATION, ROTATOR CUFF REPAIR, ARTHROSCOPIC DISTAL CLAVICLE EXCISION;  Surgeon: Addie Cordella Hamilton, MD;  Location: [facility];  Service: Orthopedics;   Laterality: Left;   TUBAL LIGATION      Social History   Socioeconomic History   Marital status: Single    Spouse name: Not on file   Number of children: 3   Years of education: Not on file   Highest education level: Some college, no degree  Occupational History   Not on file  Tobacco Use   Smoking status: Every Day    Current packs/day: 0.50    Types: Cigarettes   Smokeless tobacco: Never   Tobacco comments:    Working on cutting down on smoking. Down to 8 cigarrettes a day now.  Vaping Use   Vaping status: Never Used  Substance and Sexual Activity   Alcohol use: Not Currently   Drug use: Not Currently   Sexual activity: Yes    Birth control/protection: None  Other Topics Concern   Not on file  Social History Narrative   Lives at home with 2 daughters   Right handed   Caffeine: about 2-3 per day    Social Drivers of Health   Financial Resource Strain: Low Risk  (10/25/2021)   Received [facility], [facility]   Overall Financial Resource Strain (CARDIA)  Difficulty of Paying Living Expenses: Not hard at all  Food Insecurity: No Food Insecurity (06/26/2023)   Received from [facility]   Hunger Vital Sign    Worried About Running Out of Food in the Last Year: Never true    Ran Out of Food in the Last Year: Never true  Transportation Needs: No Transportation Needs (10/25/2021)   Received from [facility], [facility]   [facility] - Transportation    Lack of Transportation (Medical): No    Lack of Transportation (Non-Medical): No  Physical Activity: Inactive (02/05/2023)   Received from [facility], [facility]   Exercise Vital Sign    Days of Exercise per Week: 0 days    Minutes of Exercise per Session: 0 min  Stress: Stress Concern Present (02/05/2023)   Received from [facility], [facility] - Occupational Stress Questionnaire    Feeling of Stress : To some extent  Social  Connections: Moderately Isolated (10/25/2021)   Received from [facility], [facility]   Social Connection and Isolation Panel [NHANES]    Frequency of Communication with Friends and Family: More than three times a week    Frequency of Social Gatherings with Friends and Family: Never    Attends Religious Services: More than 4 times per year    Active Member of [employer] or Organizations: No    Attends Engineer, Structural: Never    Marital Status: Never married    Family History  Problem Relation Age of Onset   Lung cancer Mother    Other Father        multiple issues, doesn't know history for sure   Prostate cancer Father    Stroke Father    Aneurysm Paternal Grandmother    Breast cancer Maternal Aunt    Prostate cancer Maternal Uncle    Alcoholism Maternal Uncle    Aneurysm Cousin        paternal side   Aneurysm Cousin        paternal side    Breast cancer Cousin        maternal side    High blood pressure Other        father's side    Cerebral aneurysm Other        cerebral, on father's side    Alcoholism Other        maternal side    Colon cancer Neg Hx     Outpatient Encounter Medications as of 10/01/2023  Medication Sig   ACCU-CHEK GUIDE test strip Use 1 each to check blood glucose four times daily   Accu-Chek Softclix Lancets lancets 1 each by Other route 4 (four) times daily.   acetaminophen  (TYLENOL ) 325 MG tablet Take 650 mg by mouth every 6 (six) hours as needed for moderate pain.   ARIPiprazole  (ABILIFY ) 10 MG tablet Take 10 mg by mouth daily.   atenolol  (TENORMIN ) 50 MG tablet Take 50 mg by mouth daily.   baclofen  (LIORESAL ) 10 MG tablet Take 10 mg by mouth 3 (three) times daily as needed (Back pain).   Blood Glucose Monitoring Suppl (ACCU-CHEK GUIDE ME) w/Device KIT See admin instructions.   chlorthalidone (HYGROTON) 25 MG tablet Take 25 mg by mouth every morning.   DULoxetine  (CYMBALTA ) 60 MG capsule Take 60 mg by mouth daily in the afternoon.    famotidine  (PEPCID ) 40 MG tablet Take 1 tablet by mouth once daily  ferrous sulfate  325 (65 FE) MG EC tablet Take 1 tablet (325 mg total) by mouth daily with breakfast.   furosemide  (LASIX ) 20 MG tablet Take 1 tablet (20 mg total) by mouth daily as needed.   gabapentin  (NEURONTIN ) 300 MG capsule Take 300 mg by mouth at bedtime.   lisinopril  (ZESTRIL ) 40 MG tablet Take 40 mg by mouth daily.   meclizine (ANTIVERT) 25 MG tablet Take 25 mg by mouth 3 (three) times daily as needed for dizziness.   pantoprazole  (PROTONIX ) 40 MG tablet Take 1 tablet (40 mg total) by mouth 2 (two) times daily before a meal.   spironolactone  (ALDACTONE ) 25 MG tablet Take 12.5 mg by mouth daily.   traZODone  (DESYREL ) 50 MG tablet Take 50-100 mg by mouth at bedtime.   [DISCONTINUED] Continuous Glucose Sensor (DEXCOM G7 SENSOR) MISC Use to continuously monitor glucose. Change sensor every 10 days.   [DISCONTINUED] Insulin  lispro (HUMALOG  JUNIOR KWIKPEN) 100 UNIT/ML Inject 12-18 Units into the skin 3 (three) times daily.   [DISCONTINUED] LANTUS  SOLOSTAR 100 UNIT/ML Solostar Pen INJECT 50 UNITS SUBCUTANEOUSLY AT BEDTIME   [DISCONTINUED] Semaglutide ,0.25 or 0.5MG /DOS, (OZEMPIC , 0.25 OR 0.5 MG/DOSE,) 2 MG/1.5ML SOPN Inject 0.25 mg into the skin once a week.   Continuous Glucose Sensor (DEXCOM G7 SENSOR) MISC Use to continuously monitor glucose. Change sensor every 10 days.   insulin  glargine (LANTUS  SOLOSTAR) 100 UNIT/ML Solostar Pen Inject 30 Units into the skin at bedtime.   Semaglutide ,0.25 or 0.5MG /DOS, (OZEMPIC , 0.25 OR 0.5 MG/DOSE,) 2 MG/1.5ML SOPN Inject 0.5 mg into the skin once a week.   No facility-administered encounter medications on file as of 10/01/2023.    ALLERGIES: Allergies  Allergen Reactions   Ferumoxytol  Other (See Comments)    Hot flash, nausea, chest pressure    Hydrochlorothiazide Other (See Comments)     urinary frequency; was going every 20 minutes and was causing problems at work     VACCINATION STATUS: Immunization History  Administered Date(s) Administered   Moderna Sars-Covid-2 Vaccination 07/06/2020    Diabetes She presents for her follow-up diabetic visit. She has type 2 diabetes mellitus. Onset time: diagnosed at approx age of 56 (in June) Her disease course has been improving. There are no hypoglycemic associated symptoms. Associated symptoms include fatigue. Pertinent negatives for diabetes include no blurred vision, no polydipsia, no polyuria, no visual change and no weight loss. There are no hypoglycemic complications. Symptoms are improving. There are no diabetic complications. Risk factors for coronary artery disease include diabetes mellitus, dyslipidemia, family history, obesity, hypertension, sedentary lifestyle and tobacco exposure. Current diabetic treatment includes intensive insulin  program (and Ozempic ). She is compliant with treatment most of the time. Her weight is decreasing steadily. She is following a generally healthy diet. When asked about meal planning, she reported none. She has had a previous visit with a dietitian. She rarely participates in exercise. Her home blood glucose trend is decreasing steadily. Her breakfast blood glucose range is generally 110-130 mg/dl. Her lunch blood glucose range is generally 110-130 mg/dl. Her dinner blood glucose range is generally 110-130 mg/dl. Her bedtime blood glucose range is generally 110-130 mg/dl. Her overall blood glucose range is 110-130 mg/dl. (She presents today with her CGM showing at goal glycemic profile.  Her POCT A1c today is 7.9%, improving from last visit of 9.1%.  Analysis of her CGM shows TIR 97%, TAR 2%, TBR 1% with a GMI of 6.2%.  She notes she has not had to take her meal time insulin  but rarely due  to at goal readings.  She has tolerated the addition of Ozempic  well.) An ACE inhibitor/angiotensin II receptor blocker is being taken. She sees a podiatrist.Eye exam is not current.    Review of  systems  Constitutional: + Minimally fluctuating body weight,  current Body mass index is 35.18 kg/m. , no fatigue, no subjective hyperthermia, no subjective hypothermia Eyes: no blurry vision, no xerophthalmia ENT: no sore throat, no nodules palpated in throat, no dysphagia/odynophagia, no hoarseness Cardiovascular: no chest pain, no shortness of breath, no palpitations, no leg swelling Respiratory: no cough, no shortness of breath Gastrointestinal: no nausea/vomiting/diarrhea Musculoskeletal: no muscle/joint aches Skin: no rashes, no hyperemia Neurological: no tremors, no numbness, no tingling, no dizziness Psychiatric: no depression, no anxiety  Objective:     BP 118/86 (BP Location: Left Arm, Patient Position: Sitting, Cuff Size: Large)   Pulse 72   Ht 5' 1 (1.549 m)   Wt 186 lb 3.2 oz (84.5 kg)   LMP 05/01/2021 (Approximate)   BMI 35.18 kg/m   Wt Readings from Last 3 Encounters:  10/01/23 186 lb 4.8 oz (84.5 kg)  10/01/23 186 lb 3.2 oz (84.5 kg)  09/24/23 185 lb 6.5 oz (84.1 kg)     BP Readings from Last 3 Encounters:  10/01/23 118/86  09/24/23 131/71  06/30/23 114/79      Physical Exam- Limited  Constitutional:  Body mass index is 35.18 kg/m. , not in acute distress, normal state of mind Eyes:  EOMI, no exophthalmos Musculoskeletal: no gross deformities, strength intact in all four extremities, no gross restriction of joint movements Skin:  no rashes, no hyperemia Neurological: no tremor with outstretched hands   Diabetic Foot Exam - Simple   No data filed      CMP ( most recent) CMP     Component Value Date/Time   NA 136 10/22/2022 1233   K 3.8 10/22/2022 1233   CL 97 (L) 10/22/2022 1233   CO2 29 10/22/2022 1233   GLUCOSE 99 10/22/2022 1233   BUN 9 10/22/2022 1233   BUN 6 08/06/2022 0000   CREATININE 0.79 10/22/2022 1233   CALCIUM  9.2 10/22/2022 1233   ALBUMIN 5.3 (A) 08/02/2022 0000   AST 67 (A) 08/06/2022 0000   ALT 63 (A) 08/06/2022 0000    ALKPHOS 125 08/02/2022 0000   GFRNONAA >60 10/22/2022 1233     Diabetic Labs (most recent): Lab Results  Component Value Date   HGBA1C 7.4 (A) 12/25/2022   HGBA1C 12.6 (A) 09/02/2022   HGBA1C 7.6 06/03/2022   MICROALBUR 10 mg/L 09/30/2022     Lipid Panel ( most recent) Lipid Panel  No results found for: CHOL, TRIG, HDL, CHOLHDL, VLDL, LDLCALC, LDLDIRECT, LABVLDL    Lab Results  Component Value Date   TSH 3.600 03/17/2023   FREET4 0.93 03/17/2023         Assessment & Plan:   1) Type 2 diabetes mellitus with hyperglycemia, with long-term current use of insulin  (HCC)  She presents today with her CGM showing at goal glycemic profile.  Her POCT A1c today is 7.9%, improving from last visit of 9.1%.  Analysis of her CGM shows TIR 97%, TAR 2%, TBR 1% with a GMI of 6.2%.  She notes she has not had to take her meal time insulin  but rarely due to at goal readings.  She has tolerated the addition of Ozempic  well.  - Jesseca Marsch has currently uncontrolled symptomatic type 2 DM since 53 years of age.   -Recent labs  reviewed.  - I had a long discussion with her about the progressive nature of diabetes and the pathology behind its complications. -her diabetes is not currently complicated but she remains at a high risk for more acute and chronic complications which include CAD, CVA, CKD, retinopathy, and neuropathy. These are all discussed in detail with her.  The following Lifestyle Medicine recommendations according to American College of Lifestyle Medicine [facility]) were discussed and offered to patient and she agrees to start the journey:  A. Whole Foods, Plant-based plate comprising of fruits and vegetables, plant-based proteins, whole-grain carbohydrates was discussed in detail with the patient.   A list for source of those nutrients were also provided to the patient.  Patient will use only water or unsweetened tea for hydration. B.  The need to stay away from risky  substances including alcohol, smoking; obtaining 7 to 9 hours of restorative sleep, at least 150 minutes of moderate intensity exercise weekly, the importance of healthy social connections,  and stress reduction techniques were discussed. C.  A full color page of  Calorie density of various food groups per pound showing examples of each food groups was provided to the patient.  - Nutritional counseling repeated at each appointment due to patients tendency to fall back in to old habits.  - The patient admits there is a room for improvement in their diet and drink choices. -  Suggestion is made for the patient to avoid simple carbohydrates from their diet including Cakes, Sweet Desserts / Pastries, Ice Cream, Soda (diet and regular), Sweet Tea, Candies, Chips, Cookies, Sweet Pastries, Store Bought Juices, Alcohol in Excess of 1-2 drinks a day, Artificial Sweeteners, Coffee Creamer, and Sugar-free Products. This will help patient to have stable blood glucose profile and potentially avoid unintended weight gain.   - I encouraged the patient to switch to unprocessed or minimally processed complex starch and increased protein intake (animal or plant source), fruits, and vegetables.   - Patient is advised to stick to a routine mealtimes to eat 3 meals a day and avoid unnecessary snacks (to snack only to correct hypoglycemia).  - she has been seeing Santana Duke, RDN, CDE for diabetes education, sees her today after her appt with me.  - I have approached her with the following individualized plan to manage her diabetes and patient agrees:   She is advised to decrease her Lantus  to 30 units SQ nightly and stop meal time insulin  altogether at this time.  She can continue Ozempic  0.5 mg SQ weekly (may increase at next visit in attempts to get her off insulin  completely).  -she is encouraged to continue using CGM to monitor glucose 4 times daily, before meals and before bed, and to call the clinic if she  has readings less than 70 or above 300 for 3 tests in a row.    - she is warned not to take insulin  without proper monitoring per orders. - Adjustment parameters are given to her for hypo and hyperglycemia in writing.  - Specific targets for  A1c; LDL, HDL, and Triglycerides were discussed with the patient.  2) Blood Pressure /Hypertension:  her blood pressure is controlled to target.   she is advised to continue her current medications including Lisinopril  40 mg p.o. daily with breakfast, and Atenolol  50 mg po daily.  3) Lipids/Hyperlipidemia:    Her recent lipid panel from 02/28/23 shows controlled LDL of 99 and elevated triglycerides of 323.  She is not currently on any lipid lowering  medications.  She has appt with PCP with fasting labs coming up.  Will request copy of labs.  4)  Weight/Diet:  her Body mass index is 35.18 kg/m.  -  clearly complicating her diabetes care.   she is a candidate for weight loss. I discussed with her the fact that loss of 5 - 10% of her  current body weight will have the most impact on her diabetes management.  Exercise, and detailed carbohydrates information provided  -  detailed on discharge instructions.  5) Chronic Care/Health Maintenance: -she is on ACEI/ARB and not on Statin medications and is encouraged to initiate and continue to follow up with Ophthalmology, Dentist, Podiatrist at least yearly or according to recommendations, and advised to QUIT SMOKING. I have recommended yearly flu vaccine and pneumonia vaccine at least every 5 years; moderate intensity exercise for up to 150 minutes weekly; and sleep for at least 7 hours a day.  - she is advised to maintain close follow up with Trudy Vaughn FALCON, MD for primary care needs, as well as her other providers for optimal and coordinated care.      I spent  34  minutes in the care of the patient today including review of labs from CMP, Lipids, Thyroid Function, Hematology (current and previous including  abstractions from other facilities); face-to-face time discussing  her blood glucose readings/logs, discussing hypoglycemia and hyperglycemia episodes and symptoms, medications doses, her options of short and long term treatment based on the latest standards of care / guidelines;  discussion about incorporating lifestyle medicine;  and documenting the encounter. Risk reduction counseling performed per USPSTF guidelines to reduce obesity and cardiovascular risk factors.     Please refer to Patient Instructions for Blood Glucose Monitoring and Insulin /Medications Dosing Guide  in media tab for additional information. Please  also refer to  Patient Self Inventory in the Media  tab for reviewed elements of pertinent patient history.  Delon Lunger participated in the discussions, expressed understanding, and voiced agreement with the above plans.  All questions were answered to her satisfaction. she is encouraged to contact clinic should she have any questions or concerns prior to her return visit.     Follow up plan: - Return in about 4 months (around 01/29/2024) for Diabetes F/U with A1c in office, No previsit labs, Bring meter and logs.   Benton Rio, [facility] [address] [place], [place] [address] Phone: [phone] Fax: [phone]  10/01/2023, 3:31 PM

## 2023-10-04 DIAGNOSIS — R059 Cough, unspecified: Secondary | ICD-10-CM | POA: Diagnosis not present

## 2023-10-04 DIAGNOSIS — R509 Fever, unspecified: Secondary | ICD-10-CM | POA: Diagnosis not present

## 2023-10-04 DIAGNOSIS — R030 Elevated blood-pressure reading, without diagnosis of hypertension: Secondary | ICD-10-CM | POA: Diagnosis not present

## 2023-10-04 DIAGNOSIS — R0981 Nasal congestion: Secondary | ICD-10-CM | POA: Diagnosis not present

## 2023-10-15 DIAGNOSIS — K760 Fatty (change of) liver, not elsewhere classified: Secondary | ICD-10-CM | POA: Diagnosis not present

## 2023-10-15 DIAGNOSIS — E1169 Type 2 diabetes mellitus with other specified complication: Secondary | ICD-10-CM | POA: Diagnosis not present

## 2023-10-15 DIAGNOSIS — E7800 Pure hypercholesterolemia, unspecified: Secondary | ICD-10-CM | POA: Diagnosis not present

## 2023-10-15 DIAGNOSIS — I10 Essential (primary) hypertension: Secondary | ICD-10-CM | POA: Diagnosis not present

## 2023-10-16 DIAGNOSIS — M79672 Pain in left foot: Secondary | ICD-10-CM | POA: Diagnosis not present

## 2023-10-16 DIAGNOSIS — B070 Plantar wart: Secondary | ICD-10-CM | POA: Diagnosis not present

## 2023-10-20 ENCOUNTER — Ambulatory Visit (INDEPENDENT_AMBULATORY_CARE_PROVIDER_SITE_OTHER): Payer: 59 | Admitting: Neurology

## 2023-10-20 ENCOUNTER — Encounter: Payer: Self-pay | Admitting: Neurology

## 2023-10-20 VITALS — BP 111/70 | HR 71 | Ht 61.0 in | Wt 185.2 lb

## 2023-10-20 DIAGNOSIS — Z9189 Other specified personal risk factors, not elsewhere classified: Secondary | ICD-10-CM | POA: Diagnosis not present

## 2023-10-20 DIAGNOSIS — R519 Headache, unspecified: Secondary | ICD-10-CM | POA: Diagnosis not present

## 2023-10-20 DIAGNOSIS — D751 Secondary polycythemia: Secondary | ICD-10-CM

## 2023-10-20 DIAGNOSIS — R0683 Snoring: Secondary | ICD-10-CM | POA: Diagnosis not present

## 2023-10-20 DIAGNOSIS — G4719 Other hypersomnia: Secondary | ICD-10-CM

## 2023-10-20 DIAGNOSIS — R0681 Apnea, not elsewhere classified: Secondary | ICD-10-CM | POA: Diagnosis not present

## 2023-10-20 DIAGNOSIS — F17200 Nicotine dependence, unspecified, uncomplicated: Secondary | ICD-10-CM

## 2023-10-20 DIAGNOSIS — E669 Obesity, unspecified: Secondary | ICD-10-CM

## 2023-10-20 NOTE — Patient Instructions (Signed)

## 2023-10-20 NOTE — Progress Notes (Signed)
 Subjective:    Patient ID: Suzanne Lawrence is a 53 y.o. female.  HPI    Huston Foley, MD, PhD [facility] [address] [address] [place], [place] [address]  Dear Suzanne Lawrence,   I saw your patient, Suzanne Lawrence, upon your kind request in my sleep clinic today for initial consultation of her sleep disorder, concern for underlying obstructive sleep apnea.  The patient is unaccompanied today.  As you know, Suzanne Lawrence is a 53 year old female with an underlying medical history of iron deficiency anemia, erythrocytosis, alpha thalassemia minor trait, anxiety, depression, history of DVT, smoking, reflux disease, and obesity, who reports snoring and excessive daytime somnolence.  Her Epworth sleepiness score is 19 out of 24, fatigue severity score is 57 out of 63.  She is on multiple medications including several potentially sedating medications which includes Abilify, gabapentin, baclofen, Cymbalta, meclizine and trazodone.  I reviewed your office note from 09/24/2023. She snores loudly, per family and her sister has mentioned pauses in her breathing while she is asleep.  She is single and lives with her 68 year old nephew.  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  She smokes about half a pack per day.  She is working on smoking cessation.  She has had weight gain over time but is working on weight loss currently and lost a few pounds already.  She has 3 grown children.  She has no pets in the household.  She does have a TV in the bedroom but most of the time it is all or goes off on a sleep timer if she does fall asleep with the TV on.  Bedtime is around 930.  She takes her trazodone at 9 PM.  Rise time is around 5.  She does not have trouble falling asleep but has multiple nighttime awakenings.  She does not have any nightly nocturia but has occasionally woken up with a headache first thing in the morning.  She is not aware of any family history of  sleep apnea.  She limits her caffeine to 1 cup of coffee in the morning.  She drinks soda rarely.  She does not drink any alcohol.  Her Past Medical History Is Significant For: Past Medical History:  Diagnosis Date   Abnormal Pap smear of cervix    Anemia    issue with red blood cells being small   Anxiety    Arthritis    Carpal tunnel syndrome    right   Depression    Diabetes mellitus without complication (HCC)    DVT (deep venous thrombosis) (HCC) 03/2021   left arm   GERD (gastroesophageal reflux disease)    High blood pressure    History of hiatal hernia    Left breast mass    Pre-diabetes     Her Past Surgical History Is Significant For: Past Surgical History:  Procedure Laterality Date   BIOPSY  02/05/2022   Procedure: BIOPSY;  Surgeon: Lanelle Bal, DO;  [facility];  Service: Endoscopy;;   COLONOSCOPY  2022   Megan Straughan: 7 polyps removed, has to return in one year   COLONOSCOPY  09/2021   Megan Straughan: 12 polyps removed   ESOPHAGOGASTRODUODENOSCOPY (EGD) WITH PROPOFOL N/A 02/05/2022   Procedure: ESOPHAGOGASTRODUODENOSCOPY (EGD) WITH PROPOFOL;  Surgeon: Lanelle Bal, DO;  Location: [facility];  Service: Endoscopy;  Laterality: N/A;  2:00pm   KNEE ARTHROSCOPY Right 10/28/2022   Procedure: RIGHT KNEE ARTHROSCOPY, DEBRIDEMENT;  Surgeon: Cammy Copa, MD;  Location: [facility];  Service: Orthopedics;  Laterality: Right;   left shoulder arthroscopy  12/2020   SHOULDER ARTHROSCOPY WITH OPEN ROTATOR CUFF REPAIR AND DISTAL CLAVICLE ACROMINECTOMY Left 03/21/2022   Procedure: LEFT SHOULDER ARTHOSCOPY, DEBRIDEMENT,  MANIPULATION, ROTATOR CUFF REPAIR, ARTHROSCOPIC DISTAL CLAVICLE EXCISION;  Surgeon: Cammy Copa, MD;  Location: [facility];  Service: Orthopedics;  Laterality: Left;   TUBAL LIGATION      Her Family History Is Significant For: Family History  Problem Relation Age of Onset   Lung cancer Mother    Other Father         multiple issues, doesn't know history for sure   Prostate cancer Father    Stroke Father    Aneurysm Paternal Grandmother    Breast cancer Maternal Aunt    Prostate cancer Maternal Uncle    Alcoholism Maternal Uncle    Aneurysm Cousin        paternal side   Aneurysm Cousin        paternal side    Breast cancer Cousin        maternal side    High blood pressure Other        father's side    Cerebral aneurysm Other        cerebral, on father's side    Alcoholism Other        maternal side    Colon cancer Neg Hx     Her Social History Is Significant For: Social History   Socioeconomic History   Marital status: Single    Spouse name: Not on file   Number of children: 3   Years of education: Not on file   Highest education level: Some college, no degree  Occupational History   Not on file  Tobacco Use   Smoking status: Every Day    Current packs/day: 0.50    Types: Cigarettes   Smokeless tobacco: Never   Tobacco comments:    Working on cutting down on smoking. Down to 8 cigarrettes a day now.  Vaping Use   Vaping status: Never Used  Substance and Sexual Activity   Alcohol use: Not Currently   Drug use: Not Currently   Sexual activity: Yes    Birth control/protection: None  Other Topics Concern   Not on file  Social History Narrative   Lives at home with 2 daughters   Right handed   Caffeine: about 2-3 per day    Social Drivers of Health   Financial Resource Strain: Low Risk  (10/25/2021)   Received [facility], [facility]   Overall Financial Resource Strain (CARDIA)    Difficulty of Paying Living Expenses: Not hard at all  Food Insecurity: No Food Insecurity (06/26/2023)   Received from [facility]   Hunger Vital Sign    Worried About Running Out of Food in the Last Year: Never true    Ran Out of Food in the Last Year: Never true  Transportation Needs: No Transportation Needs (10/25/2021)   Received from [facility], [facility]   [facility] - Transportation    Lack of Transportation (Medical): No    Lack of Transportation (Non-Medical): No  Physical Activity: Inactive (02/05/2023)   Received from [facility], [facility]   Exercise Vital Sign    Days of Exercise per Week: 0 days    Minutes of Exercise per Session: 0 min  Stress: Stress Concern  Present (02/05/2023)   Received from [facility], [facility]   [facility] - Occupational Stress Questionnaire    Feeling of Stress : To some extent  Social Connections: Moderately Isolated (10/25/2021)   Received from [facility], [facility]   Social Connection and Isolation Panel [NHANES]    Frequency of Communication with Friends and Family: More than three times a week    Frequency of Social Gatherings with Friends and Family: Never    Attends Religious Services: More than 4 times per year    Active Member of [employer] or Organizations: No    Attends Engineer, structural: Never    Marital Status: Never married    Her Allergies Are:  Allergies  Allergen Reactions   Ferumoxytol Other (See Comments)    Hot flash, nausea, chest pressure    Hydrochlorothiazide Other (See Comments)     "urinary frequency"; was going every 20 minutes and was causing problems at work  :   Her Current Medications Are:  Outpatient Encounter Medications as of 10/20/2023  Medication Sig   ACCU-CHEK GUIDE test strip Use 1 each to check blood glucose four times daily   Accu-Chek Softclix Lancets lancets 1 each by Other route 4 (four) times daily.   acetaminophen (TYLENOL) 325 MG tablet Take 650 mg by mouth every 6 (six) hours as needed for moderate pain.   ARIPiprazole (ABILIFY) 10 MG tablet Take 10 mg by mouth daily.   atenolol (TENORMIN) 50 MG tablet Take 50 mg by mouth daily.   baclofen (LIORESAL) 10 MG tablet Take 10 mg by mouth 3 (three) times daily as needed (Back pain).   Blood Glucose Monitoring Suppl (ACCU-CHEK GUIDE  ME) w/Device KIT See admin instructions.   chlorthalidone (HYGROTON) 25 MG tablet Take 25 mg by mouth every morning.   Continuous Glucose Sensor (DEXCOM G7 SENSOR) MISC Use to continuously monitor glucose. Change sensor every 10 days.   DULoxetine (CYMBALTA) 60 MG capsule Take 60 mg by mouth daily in the afternoon.   ferrous sulfate 325 (65 FE) MG EC tablet Take 1 tablet (325 mg total) by mouth daily with breakfast.   furosemide (LASIX) 20 MG tablet Take 1 tablet (20 mg total) by mouth daily as needed.   gabapentin (NEURONTIN) 300 MG capsule Take 300 mg by mouth at bedtime.   insulin glargine (LANTUS SOLOSTAR) 100 UNIT/ML Solostar Pen Inject 30 Units into the skin at bedtime.   lisinopril (ZESTRIL) 40 MG tablet Take 40 mg by mouth daily.   meclizine (ANTIVERT) 25 MG tablet Take 25 mg by mouth 3 (three) times daily as needed for dizziness.   pantoprazole (PROTONIX) 40 MG tablet Take 1 tablet (40 mg total) by mouth 2 (two) times daily before a meal.   Semaglutide,0.25 or 0.5MG /DOS, (OZEMPIC, 0.25 OR 0.5 MG/DOSE,) 2 MG/1.5ML SOPN Inject 0.5 mg into the skin once a week.   spironolactone (ALDACTONE) 25 MG tablet Take 12.5 mg by mouth daily.   traZODone (DESYREL) 50 MG tablet Take 50-100 mg by mouth at bedtime.   famotidine (PEPCID) 40 MG tablet Take 1 tablet by mouth once daily (Patient not taking: Reported on 10/20/2023)   No facility-administered encounter medications on file as of 10/20/2023.  :   Review of Systems:  Out of a complete 14 point review of systems, all are reviewed and negative with the exception of these symptoms as listed below:  Review of Systems  Neurological:  Pt in rm #9 and alone. Patient states she been having some snoring and don'[t sleep good at night. Patient states she feels fatigue during the day. Pt Ess19 and FSS-57    Objective:  Neurological Exam  Physical Exam Physical Examination:   Vitals:   10/20/23 0915  BP: 111/70  Pulse: 71    General  Examination: The patient is a very pleasant 53 y.o. female in no acute distress. She appears well-developed and well-nourished and well groomed.   HEENT: Normocephalic, atraumatic, pupils are equal, round and reactive to light, extraocular tracking is good without limitation to gaze excursion or nystagmus noted. Hearing is grossly intact. Face is symmetric with mild facial masking noted. Speech is clear with no dysarthria noted. There is no hypophonia but speech is slightly slow at times. There is no lip, neck/head, jaw or voice tremor. Neck is supple with full range of passive and active motion. There are no carotid bruits on auscultation. Oropharynx exam reveals: mild mouth dryness, adequate dental hygiene, moderate airway crowding secondary to small airway, Mallampati class III, tonsillar size about 1+, tip of uvula not fully visualized even with phonation.  Neck circumference 16 inches, mild overbite noted.  Tongue protrudes centrally and palate elevates symmetrically.   Chest: Clear to auscultation without wheezing, rhonchi or crackles noted.  Heart: S1+S2+0, regular and normal without murmurs, rubs or gallops noted.   Abdomen: Soft, non-tender and non-distended.  Extremities: There is no pitting edema in the distal lower extremities bilaterally.   Skin: Warm and dry without trophic changes noted.   Musculoskeletal: exam reveals no obvious joint deformities.   Neurologically:  Mental status: The patient is awake, alert and oriented in all 4 spheres. Her immediate and remote memory, attention, language skills and fund of knowledge are appropriate.  Slight slowness in responding noted at times.  There is no evidence of aphasia, agnosia, apraxia or anomia. Speech is as above.  Thought process is linear. Mood is normal and affect is normal.  Cranial nerves II - XII are as described above under HEENT exam.  Motor exam: Normal bulk, strength and tone is noted. There is no obvious action or resting  tremor.  Fine motor skills and coordination: grossly intact.  Cerebellar testing: No dysmetria or intention tremor. There is no truncal or gait ataxia.  Sensory exam: intact to light touch in the upper and lower extremities.  Gait, station and balance: She stands easily. No veering to one side is noted. No leaning to one side is noted. Posture is age-appropriate and stance is narrow based. Gait shows normal stride length and normal pace. No problems turning are noted.   Assessment and Plan:  In summary, Suzanne Lawrence is a very pleasant 53 y.o.-year old female with an underlying medical history of iron deficiency anemia, erythrocytosis, alpha thalassemia minor trait, anxiety, depression, history of DVT, smoking, reflux disease, and obesity, whose history and physical exam are concerning for sleep disordered breathing, particularly obstructive sleep apnea (OSA). A laboratory attended sleep study is typically considered "gold standard" for evaluation of sleep disordered breathing.   I had a long chat with the patient about my findings and the diagnosis of sleep apnea, particularly OSA, its prognosis and treatment options. We talked about medical/conservative treatments, surgical interventions and non-pharmacological approaches for symptom control. I explained, in particular, the risks and ramifications of untreated moderate to severe OSA, especially with respect to developing cardiovascular disease down the road, including congestive heart failure (CHF), difficult to treat hypertension, cardiac  arrhythmias (particularly A-fib), neurovascular complications including TIA, stroke and dementia. Even type 2 diabetes has, in part, been linked to untreated OSA. Symptoms of untreated OSA may include (but may not be limited to) daytime sleepiness, nocturia (i.e. frequent nighttime urination), memory problems, mood irritability and suboptimally controlled or worsening mood disorder such as depression and/or anxiety,  lack of energy, lack of motivation, physical discomfort, as well as recurrent headaches, especially morning or nocturnal headaches. We talked about the importance of maintaining a healthy lifestyle and striving for healthy weight.  The importance of complete smoking cessation was also addressed.  In addition, we talked about the importance of striving for and maintaining good sleep hygiene. I recommended a sleep study at this time. I outlined the differences between a laboratory attended sleep study which is considered more comprehensive and accurate over the option of a home sleep test (HST); the latter may lead to underestimation of sleep disordered breathing in some instances and does not help with diagnosing upper airway resistance syndrome and is not accurate enough to diagnose primary central sleep apnea typically. I outlined possible surgical and non-surgical treatment options of OSA, including the use of a positive airway pressure (PAP) device (i.e. CPAP, AutoPAP/APAP or BiPAP in certain circumstances), a custom-made dental device (aka oral appliance, which would require a referral to a specialist dentist or orthodontist typically, and is generally speaking not considered for patients with full dentures or edentulous state), upper airway surgical options, such as traditional UPPP (which is not considered a first-line treatment) or the Inspire device (hypoglossal nerve stimulator, which would involve a referral for consultation with an ENT surgeon, after careful selection, following inclusion criteria - also not first-line treatment). I explained the PAP treatment option to the patient in detail, as this is generally considered first-line treatment.  The patient indicated that she would be willing to try PAP therapy, if the need arises. I explained the importance of being compliant with PAP treatment, not only for insurance purposes but primarily to improve patient's symptoms symptoms, and for the patient's  long term health benefit, including to reduce Her cardiovascular risks longer-term.    We will pick up our discussion about the next steps and treatment options after testing.  We will keep her posted as to the test results by phone call and/or MyChart messaging where possible.  We will plan to follow-up in sleep clinic accordingly as well.  I answered all her questions today and the patient was in agreement.   I encouraged her to call with any interim questions, concerns, problems or updates or email [place] through MyChart.  Generally speaking, sleep test authorizations may take up to 2 weeks, sometimes less, sometimes longer, the patient is encouraged to get in touch with [place] if they do not hear back from the sleep lab staff directly within the next 2 weeks.  Thank you very much for allowing me to participate in the care of this nice patient. If I can be of any further assistance to you please do not hesitate to call me at [phone].  Sincerely,   Huston Foley, MD, PhD

## 2023-10-21 DIAGNOSIS — F17210 Nicotine dependence, cigarettes, uncomplicated: Secondary | ICD-10-CM | POA: Diagnosis not present

## 2023-10-21 DIAGNOSIS — K219 Gastro-esophageal reflux disease without esophagitis: Secondary | ICD-10-CM | POA: Diagnosis not present

## 2023-10-21 DIAGNOSIS — H8110 Benign paroxysmal vertigo, unspecified ear: Secondary | ICD-10-CM | POA: Diagnosis not present

## 2023-10-21 DIAGNOSIS — I10 Essential (primary) hypertension: Secondary | ICD-10-CM | POA: Diagnosis not present

## 2023-10-22 ENCOUNTER — Telehealth: Payer: Self-pay | Admitting: Neurology

## 2023-10-22 NOTE — Telephone Encounter (Signed)
 [place] [employer] medicare[place]medicaid no auth req   Patient is scheduled at [facility] for 11/18/23 at 8 pm.  Mailed packet to the patient.

## 2023-10-28 ENCOUNTER — Encounter: Payer: Self-pay | Admitting: Gastroenterology

## 2023-11-06 DIAGNOSIS — M79671 Pain in right foot: Secondary | ICD-10-CM | POA: Diagnosis not present

## 2023-11-06 DIAGNOSIS — M79672 Pain in left foot: Secondary | ICD-10-CM | POA: Diagnosis not present

## 2023-11-06 DIAGNOSIS — B070 Plantar wart: Secondary | ICD-10-CM | POA: Diagnosis not present

## 2023-11-11 ENCOUNTER — Ambulatory Visit: Admitting: Gastroenterology

## 2023-11-12 NOTE — Progress Notes (Unsigned)
 GI Office Note    Referring Provider: Donetta Potts, MD Primary Care Physician:  Donetta Potts, MD Primary Gastroenterologist: Hennie Duos. Marletta Lor, DO  Date:  11/13/2023  ID:  Suzanne Lawrence, DOB 01-08-1971, MRN [id number]   Chief Complaint   Chief Complaint  Patient presents with   Follow-up   History of Present Illness  Suzanne Lawrence is a 53 y.o. female with a history of GERD, diabetes, DVT, HTN, anxiety, arthritis, and depression presenting today for follow-up.  Colonoscopy January 2022 (Dr. Arlyn Dunning) - One 13 mm polyp in the distal transverse colon - One 4 mm polyp in the descending colon - One 6 mm polyp at 50 cm proximal to the anus - One 9 mm polyp at 40 cm proximal to the anus - One 7 mm polyp at 25 cm proximal to the anus - Two 5 mm polyps at 24 cm proximal to the anus - Repeat colonoscopy in 1 year for surveillance based on pathology results.                                                                   Colonoscopy February 2023 (Dr. Rozell Searing): - Two 3 to 4 mm polyps in the sigmoid colon and at 20  cm proximal to the anus - Two 3 to 4 mm polyps in the descending colon,  - One 4 mm polyp in the descending colon - Four 3 to 4 mm polyps in the sigmoid colon, - Three 2 to 4 mm polyps at the recto-sigmoid colon - Path: hyperplastic polyps - Repeat in 3 years.    Patient placed on recall for TCS in February 2026.    Repeat labs in March 2023 with ferritin 5.6, iron 20, hemoglobin 8.4.  Underwent endometrial ablation in March 2023.  Received 2 iron infusions after following with [facility].   EGD 02/05/22: -normal esophagus -small hiatal hernia -gastritis s/po biopsy -3 gastric polyps removed s/p biopsy.  -Continue dexilant 60 mg daily.    RUQ [place] 08/09/22:  -The echogenicity of the liver is increased. This is a nonspecific finding but is most commonly seen with fatty infiltration of the liver.  -There are a few non-mass-like  areas of decreased attenuation  including adjacent to the gallbladder fossa, which are favored to  represent areas of focal fatty sparing. No discrete hepatic mass  lesion identified.  -Hepatomegaly   Office visit 09/10/22.  Patient was taking omeprazole once daily for GERD, reported rare breakthrough symptoms and takes super fate as needed.   denied any weight loss or lack of appetite.  Did note some feelings with fatigue after ablation but also was having vision troubles which suspected to be related to her diabetes as well as her hypertension.  She had reported some diarrhea couple months prior with stool stately polyp/capsules and was having 6-7/day but was resolved at time of office visit.  Having bowel movement daily, sometimes soft and sometimes solid.  Follow regularly with endocrinology.  BP high that day, 190s over 90s.   OV 03/17/23. Carafate 3 times per day with omeprazole once daily.  Recently started having diarrhea with about 5 pellets per day and occurring postprandially with mild abdominal cramping.  Unable to identify certain food triggers  except for salad.  Denies any melena or BRBPR.  Denies any lack of appetite or weight loss.  No abdominal pain.Start pantoprazole 40 mg BID and stop omeprazole.  Reduce Carafate to as needed.  Check fecal elastase, TSH, and celiac serologies.  Recommended Imodium 1 mg 1-2 times daily.  Low-fat diet.    Labs 03/17/2023: Pancreatic elastase normal.  TSH normal.  Celiac serologies negative.   Last office visit 05/19/23. .  No longer taking Carafate.  Doing well with PPI twice daily denied any nausea vomiting or abdominal pain.  Diarrhea have resolved.  May have some with dietary indiscretion.  Taking vitamin D.  Advised to continue pantoprazole 40 mg twice daily, refill sent in.  Advised trial of weaning off famotidine.  Follow GERD diet.  Reassuringly diarrhea has subsided and only occurring intermittently with greasy foods.     Latest Ref Rng & Units  09/17/2023    1:35 PM 03/11/2023    1:33 PM 10/22/2022   12:33 PM  CBC  WBC 4.0 - 10.5 K/uL 9.8  9.2  8.7   Hemoglobin 12.0 - 15.0 g/dL 13.0  86.5  78.4   Hematocrit 36.0 - 46.0 % 45.6  46.0  46.7   Platelets 150 - 400 K/uL 300  307  309    Iron/TIBC/Ferritin/ %Sat    Component Value Date/Time   IRON 131 09/17/2023 1335   TIBC 386 09/17/2023 1335   FERRITIN 52 09/17/2023 1335   IRONPCTSAT 34 (H) 09/17/2023 1335     Today:  Not having breakthrough daily but does occasionally. But does seem to happen more in the evenings. Has not needed to take tums at all before bed. Probably has eaten too soon before going to bed.   Denies chest pain, shortness of breath, odynophagia, nausea, vomiting, dysphagia, constipation, diarrhea, abdominal pain, melena, BRBPR.   Wt Readings from Last 3 Encounters:  11/13/23 187 lb (84.8 kg)  10/20/23 185 lb 3.2 oz (84 kg)  10/01/23 186 lb 4.8 oz (84.5 kg)    Current Outpatient Medications  Medication Sig Dispense Refill   ACCU-CHEK GUIDE test strip Use 1 each to check blood glucose four times daily 300 each 1   Accu-Chek Softclix Lancets lancets 1 each by Other route 4 (four) times daily. 300 each 1   acetaminophen (TYLENOL) 325 MG tablet Take 650 mg by mouth every 6 (six) hours as needed for moderate pain.     ARIPiprazole (ABILIFY) 10 MG tablet Take 10 mg by mouth daily.     atenolol (TENORMIN) 50 MG tablet Take 50 mg by mouth daily.     baclofen (LIORESAL) 10 MG tablet Take 10 mg by mouth 3 (three) times daily as needed (Back pain).     Blood Glucose Monitoring Suppl (ACCU-CHEK GUIDE ME) w/Device KIT See admin instructions.     chlorthalidone (HYGROTON) 25 MG tablet Take 25 mg by mouth every morning.     Continuous Glucose Sensor (DEXCOM G7 SENSOR) MISC Use to continuously monitor glucose. Change sensor every 10 days. 3 each 6   DULoxetine (CYMBALTA) 60 MG capsule Take 60 mg by mouth daily in the afternoon.     ferrous sulfate 325 (65 FE) MG EC  tablet Take 1 tablet (325 mg total) by mouth daily with breakfast. 90 tablet 3   furosemide (LASIX) 20 MG tablet Take 1 tablet (20 mg total) by mouth daily as needed. 30 tablet 2   gabapentin (NEURONTIN) 300 MG capsule Take 300 mg by mouth at  bedtime.     insulin glargine (LANTUS SOLOSTAR) 100 UNIT/ML Solostar Pen Inject 30 Units into the skin at bedtime. 30 mL 3   lisinopril (ZESTRIL) 40 MG tablet Take 40 mg by mouth daily.     meclizine (ANTIVERT) 25 MG tablet Take 25 mg by mouth 3 (three) times daily as needed for dizziness.     spironolactone (ALDACTONE) 25 MG tablet Take 12.5 mg by mouth daily.     traZODone (DESYREL) 50 MG tablet Take 50-100 mg by mouth at bedtime.     pantoprazole (PROTONIX) 40 MG tablet Take 1 tablet (40 mg total) by mouth 2 (two) times daily before a meal. 180 tablet 3   No current facility-administered medications for this visit.    Past Medical History:  Diagnosis Date   Abnormal Pap smear of cervix    Anemia    issue with red blood cells being small   Anxiety    Arthritis    Carpal tunnel syndrome    right   Depression    Diabetes mellitus without complication (HCC)    DVT (deep venous thrombosis) (HCC) 03/2021   left arm   GERD (gastroesophageal reflux disease)    High blood pressure    History of hiatal hernia    Left breast mass    Pre-diabetes     Past Surgical History:  Procedure Laterality Date   BIOPSY  02/05/2022   Procedure: BIOPSY;  Surgeon: Lanelle Bal, DO;  [facility]: [facility];  Service: Endoscopy;;   COLONOSCOPY  2022   Megan Straughan: 7 polyps removed, has to return in one year   COLONOSCOPY  09/2021   Megan Straughan: 12 polyps removed   ESOPHAGOGASTRODUODENOSCOPY (EGD) WITH PROPOFOL N/A 02/05/2022   Procedure: ESOPHAGOGASTRODUODENOSCOPY (EGD) WITH PROPOFOL;  Surgeon: Lanelle Bal, DO;  Location: [facility];  Service: Endoscopy;  Laterality: N/A;  2:00pm   KNEE ARTHROSCOPY Right 10/28/2022   Procedure: RIGHT KNEE  ARTHROSCOPY, DEBRIDEMENT;  Surgeon: Cammy Copa, MD;  Location: [facility];  Service: Orthopedics;  Laterality: Right;   left shoulder arthroscopy  12/2020   SHOULDER ARTHROSCOPY WITH OPEN ROTATOR CUFF REPAIR AND DISTAL CLAVICLE ACROMINECTOMY Left 03/21/2022   Procedure: LEFT SHOULDER ARTHOSCOPY, DEBRIDEMENT,  MANIPULATION, ROTATOR CUFF REPAIR, ARTHROSCOPIC DISTAL CLAVICLE EXCISION;  Surgeon: Cammy Copa, MD;  Location: [facility];  Service: Orthopedics;  Laterality: Left;   TUBAL LIGATION      Family History  Problem Relation Age of Onset   Lung cancer Mother    Other Father        multiple issues, doesn't know history for sure   Prostate cancer Father    Stroke Father    Aneurysm Paternal Grandmother    Breast cancer Maternal Aunt    Prostate cancer Maternal Uncle    Alcoholism Maternal Uncle    Aneurysm Cousin        paternal side   Aneurysm Cousin        paternal side    Breast cancer Cousin        maternal side    High blood pressure Other        father's side    Cerebral aneurysm Other        cerebral, on father's side    Alcoholism Other        maternal side    Colon cancer Neg Hx     Allergies as of 11/13/2023 - Review Complete 11/13/2023  Allergen Reaction Noted  Ferumoxytol Other (See Comments) 12/18/2021   Hydrochlorothiazide Other (See Comments) 04/02/2017    Social History   Socioeconomic History   Marital status: Single    Spouse name: Not on file   Number of children: 3   Years of education: Not on file   Highest education level: Some college, no degree  Occupational History   Not on file  Tobacco Use   Smoking status: Every Day    Current packs/day: 0.50    Types: Cigarettes   Smokeless tobacco: Never   Tobacco comments:    Working on cutting down on smoking. Down to 8 cigarrettes a day now.  Vaping Use   Vaping status: Never Used  Substance and Sexual Activity   Alcohol use: Not Currently   Drug use: Not Currently    Sexual activity: Yes    Birth control/protection: None  Other Topics Concern   Not on file  Social History Narrative   Lives at home with 2 daughters   Right handed   Caffeine: about 2-3 per day    Social Drivers of Health   Financial Resource Strain: Low Risk  (10/25/2021)   Received from [facility], [facility]   Overall Financial Resource Strain (CARDIA)    Difficulty of Paying Living Expenses: Not hard at all  Food Insecurity: No Food Insecurity (06/26/2023)   Received from [facility]   Hunger Vital Sign    Worried About Running Out of Food in the Last Year: Never true    Ran Out of Food in the Last Year: Never true  Transportation Needs: No Transportation Needs (10/25/2021)   Received from [facility], [facility]   [facility] - Transportation    Lack of Transportation (Medical): No    Lack of Transportation (Non-Medical): No  Physical Activity: Inactive (02/05/2023)   Received from [facility], [facility]   Exercise Vital Sign    Days of Exercise per Week: 0 days    Minutes of Exercise per Session: 0 min  Stress: Stress Concern Present (02/05/2023)   Received from [facility], [facility] - Occupational Stress Questionnaire    Feeling of Stress : To some extent  Social Connections: Moderately Isolated (10/25/2021)   Received from [facility], [facility]   Social Connection and Isolation Panel [NHANES]    Frequency of Communication with Friends and Family: More than three times a week    Frequency of Social Gatherings with Friends and Family: Never    Attends Religious Services: More than 4 times per year    Active Member of [employer] or Organizations: No    Attends Banker Meetings: Never    Marital Status: Never married     Review of Systems   Gen: Denies fever, chills, anorexia. Denies fatigue, weakness, weight loss.  CV: Denies chest pain, palpitations, syncope,  peripheral edema, and claudication. Resp: Denies dyspnea at rest, cough, wheezing, coughing up blood, and pleurisy. GI: See HPI Derm: Denies rash, itching, dry skin Psych: Denies depression, anxiety, memory loss, confusion. No homicidal or suicidal ideation.  Heme: Denies bruising, bleeding, and enlarged lymph nodes. + back pain/spasms.  Physical Exam   BP 123/78 (BP Location: Right Arm, Patient Position: Sitting, Cuff Size: Large)   Pulse 75   Temp 98.8 F (37.1 C) (Oral)   Ht 5\' 1"  (1.549 m)   Wt 187 lb (84.8 kg)   LMP 05/01/2021 (  Approximate)   SpO2 95%   BMI 35.33 kg/m   General:   Alert and oriented. No distress noted. Pleasant and cooperative.  Head:  Normocephalic and atraumatic. Eyes:  Conjuctiva clear without scleral icterus. Mouth:  Oral mucosa pink and moist. Good dentition. No lesions. Lungs:  Clear to auscultation bilaterally. No wheezes, rales, or rhonchi. No distress.  Heart:  S1, S2 present without murmurs appreciated.  Abdomen:  +BS, soft, non-tender and non-distended. No rebound or guarding. No HSM or masses noted. Rectal: deferred Msk:  Symmetrical without gross deformities. Normal posture. Neurologic:  Alert and  oriented x4 Psych:  Alert and cooperative. Normal mood and affect.  Assessment  Suzanne Lawrence is a 53 y.o. female with a history of GERD, diabetes, DVT, HTN, anxiety, arthritis, and depression presenting today for follow-up.   GERD, epigastric pain: Currently doing well on pantoprazole 40 mg twice daily.  Has weaned off famotidine.  Even while taking this did not notice much of a difference.  Currently without abdominal pain, nausea, vomiting, or dysphagia. Has had some occasional breakthrough symptoms but usually occurring in the evening. This is likely secondary to eating too close to time for bed.  Reinforced GERD lifestyle modifications today.  Refilled medication.  Advise she may use Tums as needed for occasional evening symptoms.  Anemia:  Most recent hemoglobin stable in the 14 range, has been elevated in the past.  Most recent studies with high iron saturation but with normal iron level.  Denies melena or BRBPR.  Denies chest pain or shortness of breath.  Currently taking iron once daily.  Could consider switching to every other day.  Given history of colon polyps, she is due for surveillance in January of next year, given this we discussed having him follow-up in December/January to follow-up on reflux and discuss scheduling her colonoscopy.  PLAN   Continue pantopraozle 40 mg BID.  Refilled today. Tums as needed GERD diet/lifestyle modifications.  Continue iron once daily, could consider switching to every other day. Follow up in Dec 25 or Jan 26 to discuss TCS.    Brooke Bonito, MSN, FNP-BC, AGACNP-BC [facility]

## 2023-11-13 ENCOUNTER — Ambulatory Visit (INDEPENDENT_AMBULATORY_CARE_PROVIDER_SITE_OTHER): Admitting: Gastroenterology

## 2023-11-13 ENCOUNTER — Encounter: Payer: Self-pay | Admitting: Gastroenterology

## 2023-11-13 VITALS — BP 123/78 | HR 75 | Temp 98.8°F | Ht 61.0 in | Wt 187.0 lb

## 2023-11-13 DIAGNOSIS — K219 Gastro-esophageal reflux disease without esophagitis: Secondary | ICD-10-CM | POA: Diagnosis not present

## 2023-11-13 DIAGNOSIS — Z860100 Personal history of colon polyps, unspecified: Secondary | ICD-10-CM

## 2023-11-13 DIAGNOSIS — D649 Anemia, unspecified: Secondary | ICD-10-CM | POA: Diagnosis not present

## 2023-11-13 DIAGNOSIS — R1013 Epigastric pain: Secondary | ICD-10-CM

## 2023-11-13 DIAGNOSIS — D500 Iron deficiency anemia secondary to blood loss (chronic): Secondary | ICD-10-CM

## 2023-11-13 MED ORDER — PANTOPRAZOLE SODIUM 40 MG PO TBEC
40.0000 mg | DELAYED_RELEASE_TABLET | Freq: Two times a day (BID) | ORAL | 3 refills | Status: AC
Start: 2023-11-13 — End: ?

## 2023-11-13 NOTE — Patient Instructions (Addendum)
 Continue pantoprazole 40 mg twice daily, 30 minutes prior to breakfast and dinner.  Follow a GERD diet:  Avoid fried, fatty, greasy, spicy, citrus foods. Avoid caffeine and carbonated beverages. Avoid chocolate. Try eating 4-6 small meals a day rather than 3 large meals. Do not eat within 3 hours of laying down. Prop head of bed up on wood or bricks to create a 6 inch incline.   You may use Tums as needed when you have some occasional breakthrough symptoms prior to going to bed or if you do eat a late meal and has some discomfort prior.  If you start to notice this increasing on more of a daily basis please call me and let me know.  Continue taking iron daily  We will plan to follow-up late December/early to mid January.  We can discuss scheduling your colonoscopy at that time.  It was a pleasure to see you today. I want to create trusting relationships with patients. If you receive a survey regarding your visit,  I greatly appreciate you taking time to fill this out on paper or through your MyChart. I value your feedback.  Brooke Bonito, MSN, FNP-BC, AGACNP-BC [facility]

## 2023-11-18 ENCOUNTER — Ambulatory Visit: Payer: 59 | Admitting: Neurology

## 2023-11-18 DIAGNOSIS — E669 Obesity, unspecified: Secondary | ICD-10-CM

## 2023-11-18 DIAGNOSIS — D751 Secondary polycythemia: Secondary | ICD-10-CM

## 2023-11-18 DIAGNOSIS — IMO0001 Reserved for inherently not codable concepts without codable children: Secondary | ICD-10-CM

## 2023-11-18 DIAGNOSIS — F17200 Nicotine dependence, unspecified, uncomplicated: Secondary | ICD-10-CM

## 2023-11-18 DIAGNOSIS — G4733 Obstructive sleep apnea (adult) (pediatric): Secondary | ICD-10-CM | POA: Diagnosis not present

## 2023-11-18 DIAGNOSIS — G4720 Circadian rhythm sleep disorder, unspecified type: Secondary | ICD-10-CM

## 2023-11-18 DIAGNOSIS — R519 Headache, unspecified: Secondary | ICD-10-CM

## 2023-11-18 DIAGNOSIS — R0681 Apnea, not elsewhere classified: Secondary | ICD-10-CM

## 2023-11-18 DIAGNOSIS — Z9189 Other specified personal risk factors, not elsewhere classified: Secondary | ICD-10-CM

## 2023-11-18 DIAGNOSIS — G4719 Other hypersomnia: Secondary | ICD-10-CM

## 2023-11-18 DIAGNOSIS — R0683 Snoring: Secondary | ICD-10-CM

## 2023-11-20 DIAGNOSIS — Z1231 Encounter for screening mammogram for malignant neoplasm of breast: Secondary | ICD-10-CM | POA: Diagnosis not present

## 2023-11-28 NOTE — Procedures (Signed)
 Physician Interpretation:     [facility] POLYSOMNOGRAPHY  INTERPRETATION REPORT   STUDY DATE:  11/18/2023     PATIENT NAME:  Suzanne Lawrence         DATE OF BIRTH:  [birth year]  PATIENT ID:  [id number]    TYPE OF STUDY:  PSG  READING PHYSICIAN: Huston Foley, MD, PhD SCORING TECHNICIAN: Domingo Cocking, RPSGT     Referred by: Rojelio Brenner, PA ? History and Indication for Testing: 53 year old female with an underlying medical history of iron deficiency anemia, erythrocytosis, alpha thalassemia minor trait, anxiety, depression, history of DVT, smoking, reflux disease, and obesity, who reports snoring and excessive daytime somnolence. Her Epworth sleepiness score is 19 out of 24, fatigue severity score is 57 out of 63.  Height: 61 in Weight: 185 lb (BMI 34) Neck Size: 16 in    MEDICATIONS: Tylenol, Abilify, Tenormin, Lioresal, Hygroton, Cymbalta, Iron, Lasix, Neurontin, Lantus Solostar, Zestril, Antivert, Protonix, Ozempic, Aldactone, Desyrel, Pepcid   TECHNICAL DESCRIPTION: A registered sleep technologist was in attendance for the duration of the recording.  Data collection, scoring, video monitoring, and reporting were performed in compliance with the AASM Manual for the Scoring of Sleep and Associated Events; (Hypopnea is scored based on the criteria listed in Section VIII D. 1b in the AASM Manual V2.6 using a 4% oxygen desaturation rule or Hypopnea is scored based on the criteria listed in Section VIII D. 1a in the AASM Manual V2.6 using 3% oxygen desaturation and /or arousal rule).   SLEEP CONTINUITY AND SLEEP ARCHITECTURE:  Lights-out was at 21:52: and lights-on at  05:07:, with a total recording time of 7 hours, 16 min. Total sleep time ( TST) was 403.0 minutes with a normal sleep efficiency at 92.4%. There was  10.0% REM sleep.  BODY POSITION:  TST was divided  between the following sleep positions: 38.0% supine;  62.0% lateral;  0% prone. Duration  of total sleep and percent of total sleep in their respective position is as follows: supine 153 minutes (38%), non-supine 250 minutes (62%); right 250 minutes (62%), left 00 minutes (0%), and prone 00 minutes (0%).  Total supine REM sleep time was 01 minutes (2% of total REM sleep).  Sleep latency was normal at 6.0 minutes.  REM sleep latency was markedly increased at 364.0 minutes. Of the total sleep time, the percentage of stage N1 sleep was 6.5%, stage N2 sleep was 83%, which is markedly increased, stage N3 sleep was absent and REM sleep was 10.0%, which is reduced. Wake after sleep onset (WASO) time accounted for 27 minutes with minimal to mild sleep fragmentation noted.   RESPIRATORY MONITORING:   Based on CMS criteria (using a 4% oxygen desaturation rule for scoring hypopneas), there were 24 apneas (20 obstructive; 0 central; 4 mixed), and 74 hypopneas.  Apnea index was 3.6. Hypopnea index was 11.0. The apnea-hypopnea index was 14.6 overall (14.9 supine, 30 non-supine; 31.1 REM, 60.0 supine REM).  There were 0 respiratory effort-related arousals (RERAs).  The RERA index was 0 events/h. Total respiratory disturbance index (RDI) was 14.6 events/h. RDI results showed: supine RDI  14.9 /h; non-supine RDI 14.4 /h; REM RDI 31.1 /h, supine REM RDI 60.0 /h.   Based on AASM criteria (using a 3% oxygen desaturation and /or arousal rule for scoring hypopneas), there were 24 apneas (20 obstructive; 0 central; 4 mixed), and 102 hypopneas. Apnea index was 3.6. Hypopnea index was 15.2. The apnea-hypopnea index was 18.8/hour overall (16.9 supine, 46  non-supine; 45.9 REM, 60.0 supine REM).  There were 0 respiratory effort-related arousals (RERAs).  The RERA index was 0 events/h. Total respiratory disturbance index (RDI) was 18.8 events/h. RDI results showed: supine RDI  16.9 /h; non-supine RDI 19.9 /h; REM RDI 45.9 /h, supine REM RDI 60.0 /h.  OXIMETRY: Oxyhemoglobin Saturation Nadir during sleep was at  82% from a  mean of 91%.  Of the Total sleep time (TST)   hypoxemia (=<88%) was present for  101.6 minutes, or 25.2% of total sleep time.  LIMB MOVEMENTS: There were 0 periodic limb movements of sleep (0.0/hr), of which 0 (0.0/hr) were associated with an arousal. AROUSAL: There were 21 arousals in total, for an arousal index of 3 arousals/hour.  Of these, 15 were identified as respiratory-related arousals (2 /h), 0 were PLM-related arousals (0 /h), and 15 were non-specific arousals (2 /h).   EEG: Review of the EEG showed no abnormal electrical discharges and symmetrical bihemispheric findings.    EKG: The EKG revealed normal sinus rhythm (NSR). The average heart rate during sleep was 73 bpm.  AUDIO/VIDEO REVIEW: The audio and video review did not show any abnormal or unusual behaviors, movements, phonations or vocalizations. The patient took 1 restroom break. Variable snoring was noted, ranging from mild to loud.   POST-STUDY QUESTIONNAIRE: Post study, the patient indicated, that sleep was the same as usual.   IMPRESSION:    1. Moderate Obstructive Sleep Apnea (OSA) 2. Dysfunctions associated with sleep stages or arousal from sleep  RECOMMENDATIONS:    1. This study demonstrates moderate to severe obstructive sleep apnea, with a total AHI of 18.8/hour, REM AHI of 45.9/hour, supine AHI of 16.9/hour and O2 nadir of 82% (during REM sleep). Treatment with a positive airway pressure (PAP) device is recommended.  The patient will be advised to proceed with home autoPAP therapy for now.  A laboratory attended, full night PAP-titration study can be considered down the road, to optimize therapy settings, mask fit, monitoring of tolerance and of proper oxygen saturations. Other treatment options may be limited, and may include (generally speaking) surgical options in selected patients. Concomitant weight loss is recommended. Please note that untreated obstructive sleep apnea may carry additional perioperative  morbidity. Patients with significant obstructive sleep apnea should receive perioperative PAP therapy and the surgeons and particularly the anesthesiologist should be informed of the diagnosis and the severity of the sleep disordered breathing. 2. This study shows sleep fragmentation and abnormal sleep stage percentages; these are nonspecific findings and per se do not signify an intrinsic sleep disorder or a cause for the patient's sleep-related symptoms. Causes include (but are not limited to) the first night effect of the sleep study, circadian rhythm disturbances, medication effect or an underlying mood disorder or medical problem.  3. The patient should be cautioned not to drive, work at heights, or operate dangerous or heavy equipment when tired or sleepy. Review and reiteration of good sleep hygiene measures should be pursued with any patient. 4. The patient will be seen in follow-up in the sleep clinic at [facility] for discussion of the test results, symptom and treatment compliance review, further management strategies, etc. The patient and the referring provider will be notified of the test results.   I certify that I have reviewed the entire raw data recording prior to the issuance of this report in accordance with the Standards of Accreditation of the American Academy of Sleep Medicine (AASM).  Huston Foley, MD, PhD Medical Director, [facility] ([facility]) Diplomat, ABPN (Neurology and Sleep)               Technical Report:   General Information  Name: Suzanne Lawrence, Suzanne Lawrence BMI: 34.96 Physician: Huston Foley, MD  ID: [id number] Height: 61.0 in Technician: Domingo Cocking, RPSGT  Sex: Female Weight: 185.0 lb Record: [id number]  Age: 53 [[birth year]] Date: 11/18/2023    Medical & Medication History    Ms. Honea is a 53 year old female with an underlying medical history of iron deficiency anemia, erythrocytosis, alpha thalassemia minor trait, anxiety,  depression, history of DVT, smoking, reflux disease, and obesity, who reports snoring and excessive daytime somnolence. Her Epworth sleepiness score is 19 out of 24, fatigue severity score is 57 out of 63. She is on multiple medications including several potentially sedating medications which includes Abilify, gabapentin, baclofen, Cymbalta, meclizine and trazodone. She snores loudly, per family and her sister has mentioned pauses in her breathing while she is asleep. She is single and lives with her 1 year old nephew. 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 She smokes about half a pack per day. She is working on smoking cessation. She has had weight gain over time but is working on weight loss currently and lost a few pounds already. She has 3 grown children. She has no pets in the household. She does have a TV in the bedroom but most of the time it is all or goes off on a sleep timer if she does fall asleep with the TV on. Bedtime is around 930. She takes her trazodone at 9 PM. Rise time is around 5. She does not have trouble falling asleep but has multiple nighttime awakenings. She does not have any nightly nocturia but has occasionally woken up with a headache first thing in the morning. She is not aware of any family history of sleep apnea. She limits her caffeine to 1 cup of coffee in the morning. She drinks soda rarely. She does not drink any alcohol.  Tylenol, Abilify, Tenormin, Lioresal, Hygroton, Cymbalta, Iron, Lasix, Neurontin, Lantus Solostar, Zestril, Antivert, Protonix, Ozempic, Aldactone, Desyrel, Pepcid   Sleep Disorder      Comments   Patient arrived for a diagnostic polysomnogram. Procedure explained and all questions answered. Standard paste setup without complications. Patient slept supine, left, and right. Mild to loud snoring was noted. Respiratory events observed. No obvious cardiac arrhythmias observed. No significant PLMS observed. One restroom visit.     Lights out: 09:52:07 PM Lights on: 05:07:59 AM   Time Total Supine Side Prone Upright  Recording (TRT) 7h 16.72m 3h 0.56m 4h 15.71m 0h 0.80m 0h 0.28m  Sleep (TST) 6h 43.45m 2h 33.90m 4h 10.19m 0h 0.65m 0h 0.57m   Latency N1 N2 N3 REM Onset Per. Slp. Eff.  Actual 0h 0.72m 0h 5.21m 0h 0.88m 6h 4.42m 0h 6.10m 0h 10.56m 92.43%   Stg Dur Wake N1 N2 N3 REM  Total 33.0 26.0 336.5 0.0 40.5  Supine 27.5 19.5 132.5 0.0 1.0  Side 5.5 6.5 204.0 0.0 39.5  Prone 0.0 0.0 0.0 0.0 0.0  Upright 0.0 0.0 0.0 0.0 0.0   Stg % Wake N1 N2 N3 REM  Total 7.6 6.5 83.5 0.0 10.0  Supine 6.3 4.8 32.9 0.0 0.2  Side 1.3 1.6 50.6 0.0 9.8  Prone 0.0 0.0 0.0 0.0 0.0  Upright 0.0 0.0 0.0 0.0 0.0     Apnea Summary Sub Supine Side Prone Upright  Total 24 Total 24 5 19  0 0    REM  11 1 10  0 0    NREM 13 4 9  0 0  Obs 20 REM 11 1 10  0 0    NREM 9 3 6  0 0  Mix 4 REM 0 0 0 0 0    NREM 4 1 3  0 0  Cen 0 REM 0 0 0 0 0    NREM 0 0 0 0 0   Rera Summary Sub Supine Side Prone Upright  Total 0 Total 0 0 0 0 0    REM 0 0 0 0 0    NREM 0 0 0 0 0   Hypopnea Summary Sub Supine Side Prone Upright  Total 102 Total 102 38 64 0 0    REM 20 0 20 0 0    NREM 82 38 44 0 0   4% Hypopnea Summary Sub Supine Side Prone Upright  Total (4%) 75 Total 75 33 42 0 0    REM 10 0 10 0 0    NREM 65 33 32 0 0     AHI Total Obs Mix Cen  18.76 Apnea 3.57 2.98 0.60 0.00   Hypopnea 15.19 -- -- --  14.74 Hypopnea (4%) 11.17 -- -- --    Total Supine Side Prone Upright  Position AHI 18.76 16.86 19.92 0.00 0.00  REM AHI 45.93   NREM AHI 15.72   Position RDI 18.76 16.86 19.92 0.00 0.00  REM RDI 45.93   NREM RDI 15.72    4% Hypopnea Total Supine Side Prone Upright  Position AHI (4%) 14.74 14.90 14.64 0.00 0.00  REM AHI (4%) 31.11   NREM AHI (4%) 12.91   Position RDI (4%) 14.74 14.90 14.64 0.00 0.00  REM RDI (4%) 31.11   NREM RDI (4%) 12.91    Desaturation Information Threshold: 2% <100% <90% <80% <70% <60% <50% <40%  Supine 120.0 22.0 0.0 0.0  0.0 0.0 0.0  Side 141.0 112.0 0.0 0.0 0.0 0.0 0.0  Prone 0.0 0.0 0.0 0.0 0.0 0.0 0.0  Upright 0.0 0.0 0.0 0.0 0.0 0.0 0.0  Total 261.0 134.0 0.0 0.0 0.0 0.0 0.0  Index 36.4 18.7 0.0 0.0 0.0 0.0 0.0   Threshold: 3% <100% <90% <80% <70% <60% <50% <40%  Supine 72.0 21.0 0.0 0.0 0.0 0.0 0.0  Side 102.0 89.0 0.0 0.0 0.0 0.0 0.0  Prone 0.0 0.0 0.0 0.0 0.0 0.0 0.0  Upright 0.0 0.0 0.0 0.0 0.0 0.0 0.0  Total 174.0 110.0 0.0 0.0 0.0 0.0 0.0  Index 24.3 15.3 0.0 0.0 0.0 0.0 0.0   Threshold: 4% <100% <90% <80% <70% <60% <50% <40%  Supine 50.0 18.0 0.0 0.0 0.0 0.0 0.0  Side 73.0 65.0 0.0 0.0 0.0 0.0 0.0  Prone 0.0 0.0 0.0 0.0 0.0 0.0 0.0  Upright 0.0 0.0 0.0 0.0 0.0 0.0 0.0  Total 123.0 83.0 0.0 0.0 0.0 0.0 0.0  Index 17.2 11.6 0.0 0.0 0.0 0.0 0.0   Threshold: 3% <100% <90% <80% <70% <60% <50% <40%  Supine 72 21 0 0 0 0 0  Side 102 89 0 0 0 0 0  Prone 0 0 0 0 0 0 0  Upright 0 0 0 0 0 0 0  Total 174 110 0 0 0 0 0   Awakening/Arousal Information # of Awakenings 15  Wake after sleep onset 27.1m  Wake after persistent sleep 26.20m   Arousal Assoc. Arousals Index  Apneas 8 1.2  Hypopneas 7 1.0  Leg Movements 0 0.0  Snore 0  0.0  PTT Arousals 0 0.0  Spontaneous 15 2.2  Total 30 4.5  Leg Movement Information PLMS LMs Index  Total LMs during PLMS 0 0.0  LMs w/ Microarousals 0 0.0   LM LMs Index  w/ Microarousal 0 0.0  w/ Awakening 1 0.1  w/ Resp Event 2 0.3  Spontaneous 1 0.1  Total 3 0.4     Desaturation threshold setting: 3% Minimum desaturation setting: 10 seconds SaO2 nadir: 68% The longest event was a 23 sec obstructive Hypopnea with a minimum SaO2 of 87%. The lowest SaO2 was 82% associated with a 14 sec obstructive Apnea. EKG Rates EKG Avg Max Min  Awake 74 96 64  Asleep 73 89 63  EKG Events: Tachycardia

## 2023-11-28 NOTE — Addendum Note (Signed)
 Addended by: Huston Foley on: 11/28/2023 10:10 AM   Modules accepted: Orders

## 2023-12-04 ENCOUNTER — Telehealth: Payer: Self-pay | Admitting: *Deleted

## 2023-12-04 NOTE — Telephone Encounter (Signed)
 I called the pt and discussed her sleep study results as noted below by Dr Frances Furbish.  The patient verbalized understanding and agreed to AutoPap therapy.  Patient's questions were answered.  We discussed the insurance compliance requirements which includes using the machine at least 4 hours at night and also being seen in our office between 30 and 90 days after set up.  Patient was scheduled for initial follow-up appointment on July 8 at 11:30 AM.  She did not have a preference for DME and is okay with [facility].  She will watch for a call from them within 1 week and will call us if she does not hear.   Referral sent to [facility]. Report sent to referring provider.

## 2023-12-04 NOTE — Telephone Encounter (Signed)
-----   Message from Huston Foley sent at 11/28/2023 10:10 AM EDT ----- Patient referred by hematology, seen by me on 10/20/23, diagnostic PSG on 11/18/23.    Please call and notify the patient that the recent sleep study showed moderate obstructive sleep apnea. I recommend treatment for in the form of autoPAP. We may consider at a CPAP titration study at a later date, if need be, which means, that we would ask her to come back in for a second sleep study with CPAP treatment. For now, I would like to start her on a so-called autoPAP machine at home, through a DME company (of her choice, or as per insurance requirement). The DME representative will educate her on how to use the machine, how to put the mask on, etc. I have placed an order in the chart. Please send referral, talk to patient, send report to referring MD. We will need a FU in sleep clinic for 10 weeks post-PAP set up, please arrange that with me or one of our NPs. Thanks,   Huston Foley, MD, PhD [facility])

## 2023-12-04 NOTE — Telephone Encounter (Signed)
 New, Maryella Shivers, Otilio Jefferson, RN; Alain Honey; Jeris Penta, [place]; 1 other Received, thank you!

## 2024-01-01 DIAGNOSIS — G4733 Obstructive sleep apnea (adult) (pediatric): Secondary | ICD-10-CM | POA: Diagnosis not present

## 2024-01-29 ENCOUNTER — Ambulatory Visit: Payer: 59 | Admitting: Nurse Practitioner

## 2024-01-29 ENCOUNTER — Ambulatory Visit: Payer: 59 | Admitting: Nutrition

## 2024-01-29 DIAGNOSIS — Z794 Long term (current) use of insulin: Secondary | ICD-10-CM

## 2024-01-29 DIAGNOSIS — Z7985 Long-term (current) use of injectable non-insulin antidiabetic drugs: Secondary | ICD-10-CM

## 2024-01-29 DIAGNOSIS — I10 Essential (primary) hypertension: Secondary | ICD-10-CM

## 2024-01-29 DIAGNOSIS — E1165 Type 2 diabetes mellitus with hyperglycemia: Secondary | ICD-10-CM

## 2024-01-29 NOTE — Telephone Encounter (Signed)
 TC to pt. Due to no show. She notes she overslept. Advised to contact Whitney Reardon's office to r/s and call me back to r/s after she gets appt with them. She verbalized understanding.

## 2024-02-05 ENCOUNTER — Encounter: Attending: Nurse Practitioner | Admitting: Nutrition

## 2024-02-05 VITALS — Ht 61.0 in | Wt 187.0 lb

## 2024-02-05 DIAGNOSIS — E118 Type 2 diabetes mellitus with unspecified complications: Secondary | ICD-10-CM | POA: Insufficient documentation

## 2024-02-05 DIAGNOSIS — E66812 Obesity, class 2: Secondary | ICD-10-CM | POA: Diagnosis present

## 2024-02-05 DIAGNOSIS — I10 Essential (primary) hypertension: Secondary | ICD-10-CM | POA: Diagnosis not present

## 2024-02-05 NOTE — Progress Notes (Signed)
 Medical Nutrition Therapy  Appointment Start time:  1500  Appointment End time:  1535  Primary concerns today: DM Type 2 Obesity  Referral diagnosis: E11.8, DE66.9 Preferred learning style: No preference  Learning readiness: Ready, change in progress    NUTRITION ASSESSMENT Folllow up 53 yr old bfemale referred for Type 2 DM and Obesity. Sees Hulon Magic, FNP. Reduced her Lantus  to 30 today. Stopped her meal time insulin . A1C down to 7.9%., down from 8%. Dexcom shows TIR 97%.  Is currently on Ozmepic. Eating a lot more vegetables, drinking water and going to the [facility] twice a week. Doesn't get good sleep. Has lights on in bedroom-can't sleep in dark. PCP Dr. Broadus Canes. Doesn't like chicken or seafood. Still on Ozmepic. Lost from 207 to 185.3 lbs. Eating three meals per day  She is willing to work on making better food choices and committing to lifestyle changes.  Goals set previously:  Eat 1 piece of fruit per meal- done Keep eating vegetables with lunch and dinner-improved Keep working out at [employer] twice a week.- Working on it. Lose 1 lb per week.-no weight loss recently   Clinical Wt Readings from Last 3 Encounters:  02/05/24 187 lb (84.8 kg)  11/13/23 187 lb (84.8 kg)  10/20/23 185 lb 3.2 oz (84 kg)   Ht Readings from Last 3 Encounters:  02/05/24 5' 1 (1.549 m)  11/13/23 5' 1 (1.549 m)  10/20/23 5' 1 (1.549 m)   Body mass index is 35.33 kg/m. @BMIFA @ Facility age limit for growth %iles is 20 years. Facility age limit for growth %iles is 20 years.  Medical Hx:  Past Medical History:  Diagnosis Date   Abnormal Pap smear of cervix    Anemia    issue with red blood cells being small   Anxiety    Arthritis    Carpal tunnel syndrome    right   Depression    Diabetes mellitus without complication (HCC)    DVT (deep venous thrombosis) (HCC) 03/2021   left arm   GERD (gastroesophageal reflux disease)    High blood pressure    History of hiatal hernia    Left  breast mass    Pre-diabetes     Medications:  Current Outpatient Medications on File Prior to Visit  Medication Sig Dispense Refill   ACCU-CHEK GUIDE test strip Use 1 each to check blood glucose four times daily 300 each 1   Accu-Chek Softclix Lancets lancets 1 each by Other route 4 (four) times daily. 300 each 1   acetaminophen  (TYLENOL ) 325 MG tablet Take 650 mg by mouth every 6 (six) hours as needed for moderate pain.     ARIPiprazole (ABILIFY) 10 MG tablet Take 10 mg by mouth daily.     atenolol (TENORMIN) 50 MG tablet Take 50 mg by mouth daily.     baclofen (LIORESAL) 10 MG tablet Take 10 mg by mouth 3 (three) times daily as needed (Back pain).     Blood Glucose Monitoring Suppl (ACCU-CHEK GUIDE ME) w/Device KIT See admin instructions.     chlorthalidone (HYGROTON) 25 MG tablet Take 25 mg by mouth every morning.     Continuous Glucose Sensor (DEXCOM G7 SENSOR) MISC Use to continuously monitor glucose. Change sensor every 10 days. 3 each 6   DULoxetine (CYMBALTA) 60 MG capsule Take 60 mg by mouth daily in the afternoon.     ferrous sulfate  325 (65 FE) MG EC tablet Take 1 tablet (325 mg total) by mouth daily with  breakfast. 90 tablet 3   furosemide  (LASIX ) 20 MG tablet Take 1 tablet (20 mg total) by mouth daily as needed. 30 tablet 2   gabapentin (NEURONTIN) 300 MG capsule Take 300 mg by mouth at bedtime.     insulin  glargine (LANTUS  SOLOSTAR) 100 UNIT/ML Solostar Pen Inject 30 Units into the skin at bedtime. 30 mL 3   lisinopril (ZESTRIL) 40 MG tablet Take 40 mg by mouth daily.     meclizine (ANTIVERT) 25 MG tablet Take 25 mg by mouth 3 (three) times daily as needed for dizziness.     pantoprazole  (PROTONIX ) 40 MG tablet Take 1 tablet (40 mg total) by mouth 2 (two) times daily before a meal. 180 tablet 3   spironolactone (ALDACTONE) 25 MG tablet Take 12.5 mg by mouth daily.     traZODone (DESYREL) 50 MG tablet Take 50-100 mg by mouth at bedtime.     No current facility-administered  medications on file prior to visit.    Labs:  Lab Results  Component Value Date   HGBA1C 7.4 (A) 12/25/2022   Decom est A1C 6.2%.  Notable Signs/Symptoms: None  Lifestyle & Dietary Hx LIves by herself.  Estimated daily fluid intake: 60 oz Supplements: Sleep: poor sleep- up and down. Is on trazadone Stress / self-care: anxiety and depression Current average weekly physical activity: chair exercises and will start [employer] twice a week.  24-Hr Dietary Recall First Meal: Glucerna once a day Lunch and Dinner- usually leftovers. Cooks at home.  Estimated Energy Needs Calories: 1200 Carbohydrate: 135g Protein: 90g Fat: 33g   NUTRITION DIAGNOSIS  NB-1.1 Food and nutrition-related knowledge deficit As related to Diabetes Type 2.  As evidenced by A1C 7.3%.   NUTRITION INTERVENTION  Nutrition education (E-1) on the following topics:  Nutrition and Diabetes education provided on My Plate, CHO counting, meal planning, portion sizes, timing of meals, avoiding snacks between meals unless having a low blood sugar, target ranges for A1C and blood sugars, signs/symptoms and treatment of hyper/hypoglycemia, monitoring blood sugars, taking medications as prescribed, benefits of exercising 30 minutes per day and prevention of complications of DM.  Lifestyle Medicine  - Whole Food, Plant Predominant Nutrition is highly recommended: Eat Plenty of vegetables, Mushrooms, fruits, Legumes, Whole Grains, Nuts, seeds in lieu of processed meats, processed snacks/pastries red meat, poultry, eggs.    -It is better to avoid simple carbohydrates including: Cakes, Sweet Desserts, Ice Cream, Soda (diet and regular), Sweet Tea, Candies, Chips, Cookies, Store Bought Juices, Alcohol in Excess of  1-2 drinks a day, Lemonade,  Artificial Sweeteners, Doughnuts, Coffee Creamers, Sugar-free Products, etc, etc.  This is not a complete list.....  Exercise: If you are able: 30 -60 minutes a day ,4 days a week, or 150  minutes a week.  The longer the better.  Combine stretch, strength, and aerobic activities.  If you were told in the past that you have high risk for cardiovascular diseases, you may seek evaluation by your heart doctor prior to initiating moderate to intense exercise programs.  MyFullplateliving and meal planning   Handouts Provided Include  Lifestyle Medicine Full Plate Living   Learning Style & Readiness for Change Teaching method utilized: Visual & Auditory  Demonstrated degree of understanding via: Teach Back  Barriers to learning/adherence to lifestyle change: anxiety and depression  Goals Established by Pt  Try not to eat after 8 pm. Get back to exercising 2 times per week. Try the motion sensor lights in bedroom to help with better sleep.  Create a bedtime routine.  MONITORING & EVALUATION Dietary intake, weekly physical activity, and weight in 3 month.  Next Steps  Patient is to work on meal planning.Aaron Aas

## 2024-02-05 NOTE — Patient Instructions (Signed)
 Goals  Try not to eat after 8 pm. Get back to exercising 2 times per week. Try the motion sensor lights in bedroom. Create a bedtime routine.

## 2024-02-10 ENCOUNTER — Telehealth: Payer: Self-pay | Admitting: Neurology

## 2024-02-10 DIAGNOSIS — E1169 Type 2 diabetes mellitus with other specified complication: Secondary | ICD-10-CM | POA: Diagnosis not present

## 2024-02-10 DIAGNOSIS — I10 Essential (primary) hypertension: Secondary | ICD-10-CM | POA: Diagnosis not present

## 2024-02-10 DIAGNOSIS — K760 Fatty (change of) liver, not elsewhere classified: Secondary | ICD-10-CM | POA: Diagnosis not present

## 2024-02-10 NOTE — Telephone Encounter (Signed)
 ..  Pt understands that although there may be some limitations with this type of visit, we will take all precautions to reduce any security or privacy concerns.  Pt understands that this will be treated like an in office visit and we will file with pt's insurance, and there may be a patient responsible charge related to this service. ? ?

## 2024-02-10 NOTE — Telephone Encounter (Signed)
 Noted, will be seeing Sarah NP.  [name] did message pt.

## 2024-02-11 ENCOUNTER — Encounter: Payer: Self-pay | Admitting: Nutrition

## 2024-02-11 ENCOUNTER — Telehealth: Admitting: Neurology

## 2024-02-11 DIAGNOSIS — G4733 Obstructive sleep apnea (adult) (pediatric): Secondary | ICD-10-CM | POA: Diagnosis not present

## 2024-02-11 NOTE — Progress Notes (Signed)
 Patient: Suzanne Lawrence Date of Birth: 1971-04-02  Reason for Visit: Follow up History from: Patient Primary Neurologist: [name]  Virtual Visit via Video Note  I connected with [name] on 02/11/24 at  2:30 PM EDT by a video enabled telemedicine application and verified that I am speaking with the correct person using two identifiers.  Location: Patient: at her home Provider: in the office    I discussed the limitations of evaluation and management by telemedicine and the availability of in person appointments. The patient expressed understanding and agreed to proceed.  ASSESSMENT AND PLAN 53 y.o. year old female   1.  OSA on CPAP (PSG 11/18/2023 showed moderate OSA.  Started CPAP 01/01/2024)  - Doing great on CPAP.  Has good compliance.  We will continue current settings.  She will continue to use nightly minimum 4 hours.  Continue to replace supplies routinely through DME.  Is getting close to the upper limit of max pressure, but AHI is well treated.  Will follow-up in 6 months virtually or sooner if needed.  HISTORY OF PRESENT ILLNESS: Today 02/11/24 Saw Dr. [name] in Feb 2025 reporting snoring and daytime somnolence.  PSG 11/18/2023 showed moderate OSA.  Started CPAP 01/01/2024.  CPAP compliance shows 83% compliance greater than 4 hours.  6 to 13 cm water.  Leak 17.1.  AHI 1.3. Having hard time adjusting, the FFM caused her lips crack, using nasal pillow now, but feels is leaking. Feels more rested, more daytime energy. Used to be driving drowsy now feels alert, energized. Just started working, [employer] as a Biochemist, clinical, works nightshift.  Pleased with CPAP, motivated to continue use.  HISTORY  10/20/23 Dr. [name]: I saw your patient, Suzanne Lawrence, upon your kind request in my sleep clinic today for initial consultation of her sleep disorder, concern for underlying obstructive sleep apnea.  The patient is unaccompanied today.  As you know, [name] is a  53 year old female with an underlying medical history of iron deficiency anemia, erythrocytosis, alpha thalassemia minor trait, anxiety, depression, history of DVT, smoking, reflux disease, and obesity, who reports snoring and excessive daytime somnolence.  Her Epworth sleepiness score is 19 out of 24, fatigue severity score is 57 out of 63.  She is on multiple medications including several potentially sedating medications which includes Abilify, gabapentin, baclofen, Cymbalta, meclizine and trazodone.  I reviewed your office note from 09/24/2023. She snores loudly, per family and her sister has mentioned pauses in her breathing while she is asleep.  She is single and lives with her 18 year old nephew.  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  She smokes about half a pack per day.  She is working on smoking cessation.  She has had weight gain over time but is working on weight loss currently and lost a few pounds already.  She has 3 grown children.  She has no pets in the household.  She does have a TV in the bedroom but most of the time it is all or goes off on a sleep timer if she does fall asleep with the TV on.  Bedtime is around 930.  She takes her trazodone at 9 PM.  Rise time is around 5.  She does not have trouble falling asleep but has multiple nighttime awakenings.  She does not have any nightly nocturia but has occasionally woken up with a headache first thing in the morning.  She is not aware of any family history of sleep apnea.  She limits her caffeine to 1 cup of coffee in the morning.  She drinks soda rarely.  She does not drink any alcohol.   REVIEW OF SYSTEMS: Out of a complete 14 system review of symptoms, the patient complains only of the following symptoms, and all other reviewed systems are negative.  See HPI  ALLERGIES: Allergies  Allergen Reactions   Ferumoxytol  Other (See Comments)    Hot flash, nausea, chest pressure    Hydrochlorothiazide Other (See  Comments)     urinary frequency; was going every 20 minutes and was causing problems at work    HOME MEDICATIONS: Outpatient Medications Prior to Visit  Medication Sig Dispense Refill   ACCU-CHEK GUIDE test strip Use 1 each to check blood glucose four times daily 300 each 1   Accu-Chek Softclix Lancets lancets 1 each by Other route 4 (four) times daily. 300 each 1   acetaminophen  (TYLENOL ) 325 MG tablet Take 650 mg by mouth every 6 (six) hours as needed for moderate pain.     ARIPiprazole (ABILIFY) 10 MG tablet Take 10 mg by mouth daily.     atenolol (TENORMIN) 50 MG tablet Take 50 mg by mouth daily.     baclofen (LIORESAL) 10 MG tablet Take 10 mg by mouth 3 (three) times daily as needed (Back pain).     Blood Glucose Monitoring Suppl (ACCU-CHEK GUIDE ME) w/Device KIT See admin instructions.     chlorthalidone (HYGROTON) 25 MG tablet Take 25 mg by mouth every morning.     Continuous Glucose Sensor (DEXCOM G7 SENSOR) MISC Use to continuously monitor glucose. Change sensor every 10 days. 3 each 6   DULoxetine (CYMBALTA) 60 MG capsule Take 60 mg by mouth daily in the afternoon.     ferrous sulfate  325 (65 FE) MG EC tablet Take 1 tablet (325 mg total) by mouth daily with breakfast. 90 tablet 3   furosemide  (LASIX ) 20 MG tablet Take 1 tablet (20 mg total) by mouth daily as needed. 30 tablet 2   gabapentin (NEURONTIN) 300 MG capsule Take 300 mg by mouth at bedtime.     insulin  glargine (LANTUS  SOLOSTAR) 100 UNIT/ML Solostar Pen Inject 30 Units into the skin at bedtime. 30 mL 3   lisinopril (ZESTRIL) 40 MG tablet Take 40 mg by mouth daily.     meclizine (ANTIVERT) 25 MG tablet Take 25 mg by mouth 3 (three) times daily as needed for dizziness.     pantoprazole  (PROTONIX ) 40 MG tablet Take 1 tablet (40 mg total) by mouth 2 (two) times daily before a meal. 180 tablet 3   spironolactone (ALDACTONE) 25 MG tablet Take 12.5 mg by mouth daily.     traZODone (DESYREL) 50 MG tablet Take 50-100 mg by mouth  at bedtime.     No facility-administered medications prior to visit.    PAST MEDICAL HISTORY: Past Medical History:  Diagnosis Date   Abnormal Pap smear of cervix    Anemia    issue with red blood cells being small   Anxiety    Arthritis    Carpal tunnel syndrome    right   Depression    Diabetes mellitus without complication (HCC)    DVT (deep venous thrombosis) (HCC) 03/2021   left arm   GERD (gastroesophageal reflux disease)    High blood pressure    History of hiatal hernia    Left breast mass    Pre-diabetes     PAST SURGICAL HISTORY: Past Surgical History:  Procedure Laterality  Date   BIOPSY  02/05/2022   Procedure: BIOPSY;  Surgeon: Vinetta Greening, DO;  [facility]: [facility];  Service: Endoscopy;;   COLONOSCOPY  2022   Megan Straughan: 7 polyps removed, has to return in one year   COLONOSCOPY  09/2021   Terese Fendt: 12 polyps removed   ESOPHAGOGASTRODUODENOSCOPY (EGD) WITH PROPOFOL  N/A 02/05/2022   Procedure: ESOPHAGOGASTRODUODENOSCOPY (EGD) WITH PROPOFOL ;  Surgeon: Vinetta Greening, DO;  Location: [facility];  Service: Endoscopy;  Laterality: N/A;  2:00pm   KNEE ARTHROSCOPY Right 10/28/2022   Procedure: RIGHT KNEE ARTHROSCOPY, DEBRIDEMENT;  Surgeon: Jasmine Mesi, MD;  Location: [facility];  Service: Orthopedics;  Laterality: Right;   left shoulder arthroscopy  12/2020   SHOULDER ARTHROSCOPY WITH OPEN ROTATOR CUFF REPAIR AND DISTAL CLAVICLE ACROMINECTOMY Left 03/21/2022   Procedure: LEFT SHOULDER ARTHOSCOPY, DEBRIDEMENT,  MANIPULATION, ROTATOR CUFF REPAIR, ARTHROSCOPIC DISTAL CLAVICLE EXCISION;  Surgeon: Jasmine Mesi, MD;  Location: [facility];  Service: Orthopedics;  Laterality: Left;   TUBAL LIGATION      FAMILY HISTORY: Family History  Problem Relation Age of Onset   Lung cancer Mother    Other Father        multiple issues, doesn't know history for sure   Prostate cancer Father    Stroke Father    Aneurysm Paternal  Grandmother    Breast cancer Maternal Aunt    Prostate cancer Maternal Uncle    Alcoholism Maternal Uncle    Aneurysm Cousin        paternal side   Aneurysm Cousin        paternal side    Breast cancer Cousin        maternal side    High blood pressure Other        father's side    Cerebral aneurysm Other        cerebral, on father's side    Alcoholism Other        maternal side    Colon cancer Neg Hx     SOCIAL HISTORY: Social History   Socioeconomic History   Marital status: Single    Spouse name: Not on file   Number of children: 3   Years of education: Not on file   Highest education level: Some college, no degree  Occupational History   Not on file  Tobacco Use   Smoking status: Every Day    Current packs/day: 0.50    Types: Cigarettes   Smokeless tobacco: Never   Tobacco comments:    Working on cutting down on smoking. Down to 8 cigarrettes a day now.  Vaping Use   Vaping status: Never Used  Substance and Sexual Activity   Alcohol use: Not Currently   Drug use: Not Currently   Sexual activity: Yes    Birth control/protection: None  Other Topics Concern   Not on file  Social History Narrative   Lives at home with 2 daughters   Right handed   Caffeine: about 2-3 per day    Social Drivers of Health   Financial Resource Strain: Low Risk  (02/05/2024)   [facility]   Overall Financial Resource Strain (CARDIA)    How hard is it for you to pay for the very basics like food, housing, medical care, and heating?: Not hard at all  Food Insecurity: Food Insecurity Present (02/05/2024)   Received from [facility]   Hunger Vital Sign    Within the past  12 months, you worried that your food would run out before you got the money to buy more.: Sometimes true    Within the past 12 months, the food you bought just didn't last and you didn't have money to get more.: Sometimes true  Transportation Needs: No Transportation Needs (02/05/2024)    Received from [facility]   PRAPARE - Transportation    Lack of Transportation (Medical): No    Lack of Transportation (Non-Medical): No  Physical Activity: Inactive (02/05/2023)   Received from [facility]   Exercise Vital Sign    On average, how many days per week do you engage in moderate to strenuous exercise (like a brisk walk)?: 0 days    On average, how many minutes do you engage in exercise at this level?: 0 min  Stress: Stress Concern Present (02/05/2023)   Received from [facility] - Occupational Stress Questionnaire    Feeling of Stress : To some extent  Social Connections: Moderately Isolated (10/25/2021)   Received from [facility]   Social Connection and Isolation Panel    In a typical week, how many times do you talk on the phone with family, friends, or neighbors?: More than three times a week    How often do you get together with friends or relatives?: Never    How often do you attend church or religious services?: More than 4 times per year    Do you belong to any clubs or organizations such as church groups, unions, fraternal or athletic groups, or school groups?: No    How often do you attend meetings of the clubs or organizations you belong to?: Never    Are you married, widowed, divorced, separated, never married, or living with a partner?: Never married  Intimate Partner Violence: Not At Risk (02/05/2023)   Received from [facility]   Humiliation, Afraid, Rape, and Kick questionnaire    Within the last year, have you been afraid of your partner or ex-partner?: No    Within the last year, have you been humiliated or emotionally abused in other ways by your partner or ex-partner?: No    Within the last year, have you been kicked, hit, slapped, or otherwise physically hurt by your partner or ex-partner?: No    Within the last year, have you been raped or forced to have any kind of sexual activity by your partner  or ex-partner?: No    PHYSICAL EXAM  There were no vitals filed for this visit. There is no height or weight on file to calculate BMI.  Generalized: Well developed, in no acute distress  Via video visit, is alert and oriented, speech is clear and concise, facial symmetry noted, moves about freely.  DIAGNOSTIC DATA (LABS, IMAGING, TESTING) - I reviewed patient records, labs, notes, testing and imaging myself where available.  Lab Results  Component Value Date   WBC 9.8 09/17/2023   HGB 14.6 09/17/2023   HCT 45.6 09/17/2023   MCV 83.4 09/17/2023   PLT 300 09/17/2023      Component Value Date/Time   NA 136 10/22/2022 1233   K 3.8 10/22/2022 1233   CL 97 (L) 10/22/2022 1233   CO2 29 10/22/2022 1233   GLUCOSE 99 10/22/2022 1233   BUN 9 10/22/2022 1233   BUN 6 08/06/2022 0000   CREATININE 0.79 10/22/2022 1233   CALCIUM 9.2 10/22/2022 1233   ALBUMIN 5.3 (A) 08/02/2022 0000  AST 67 (A) 08/06/2022 0000   ALT 63 (A) 08/06/2022 0000   ALKPHOS 125 08/02/2022 0000   GFRNONAA >60 10/22/2022 1233   No results found for: CHOL, HDL, LDLCALC, LDLDIRECT, TRIG, CHOLHDL Lab Results  Component Value Date   HGBA1C 7.4 (A) 12/25/2022   Lab Results  Component Value Date   VITAMINB12 935 12/09/2019   Lab Results  Component Value Date   TSH 3.600 03/17/2023    Jeanmarie Millet, AGNP-C, DNP 02/11/2024, 5:42 AM [facility] [address], [address] [place], [place] [address] [phone]

## 2024-02-11 NOTE — Patient Instructions (Signed)
 Great to see you today.  Keep up the great work with CPAP! Recommend use CPAP nightly minimum 4 hours.  Please continue to replace supplies routinely through your DME.  Call for any issues.  Follow-up in 6 months.  Thanks!!

## 2024-02-17 DIAGNOSIS — I10 Essential (primary) hypertension: Secondary | ICD-10-CM | POA: Diagnosis not present

## 2024-02-17 DIAGNOSIS — F17210 Nicotine dependence, cigarettes, uncomplicated: Secondary | ICD-10-CM | POA: Diagnosis not present

## 2024-02-17 DIAGNOSIS — Z0001 Encounter for general adult medical examination with abnormal findings: Secondary | ICD-10-CM | POA: Diagnosis not present

## 2024-02-17 DIAGNOSIS — K219 Gastro-esophageal reflux disease without esophagitis: Secondary | ICD-10-CM | POA: Diagnosis not present

## 2024-02-17 DIAGNOSIS — H8110 Benign paroxysmal vertigo, unspecified ear: Secondary | ICD-10-CM | POA: Diagnosis not present

## 2024-02-23 DIAGNOSIS — F17210 Nicotine dependence, cigarettes, uncomplicated: Secondary | ICD-10-CM | POA: Diagnosis not present

## 2024-02-23 DIAGNOSIS — Z87891 Personal history of nicotine dependence: Secondary | ICD-10-CM | POA: Diagnosis not present

## 2024-02-23 DIAGNOSIS — K760 Fatty (change of) liver, not elsewhere classified: Secondary | ICD-10-CM | POA: Diagnosis not present

## 2024-02-23 DIAGNOSIS — R911 Solitary pulmonary nodule: Secondary | ICD-10-CM | POA: Diagnosis not present

## 2024-02-23 DIAGNOSIS — Z122 Encounter for screening for malignant neoplasm of respiratory organs: Secondary | ICD-10-CM | POA: Diagnosis not present

## 2024-02-24 DIAGNOSIS — G4733 Obstructive sleep apnea (adult) (pediatric): Secondary | ICD-10-CM | POA: Diagnosis not present

## 2024-03-02 ENCOUNTER — Ambulatory Visit: Admitting: Adult Health

## 2024-03-04 DIAGNOSIS — E1165 Type 2 diabetes mellitus with hyperglycemia: Secondary | ICD-10-CM | POA: Diagnosis not present

## 2024-03-04 DIAGNOSIS — K760 Fatty (change of) liver, not elsewhere classified: Secondary | ICD-10-CM | POA: Diagnosis not present

## 2024-03-10 ENCOUNTER — Encounter (HOSPITAL_COMMUNITY): Payer: Self-pay | Admitting: Hematology

## 2024-03-15 DIAGNOSIS — M545 Low back pain, unspecified: Secondary | ICD-10-CM | POA: Diagnosis not present

## 2024-03-15 DIAGNOSIS — M6281 Muscle weakness (generalized): Secondary | ICD-10-CM | POA: Diagnosis not present

## 2024-03-17 ENCOUNTER — Encounter (HOSPITAL_COMMUNITY): Payer: Self-pay | Admitting: Hematology

## 2024-03-17 ENCOUNTER — Inpatient Hospital Stay: Payer: 59 | Attending: Hematology

## 2024-03-17 DIAGNOSIS — D563 Thalassemia minor: Secondary | ICD-10-CM | POA: Diagnosis not present

## 2024-03-17 DIAGNOSIS — D509 Iron deficiency anemia, unspecified: Secondary | ICD-10-CM | POA: Diagnosis not present

## 2024-03-17 DIAGNOSIS — D500 Iron deficiency anemia secondary to blood loss (chronic): Secondary | ICD-10-CM

## 2024-03-17 DIAGNOSIS — R030 Elevated blood-pressure reading, without diagnosis of hypertension: Secondary | ICD-10-CM | POA: Diagnosis not present

## 2024-03-17 DIAGNOSIS — L02436 Carbuncle of left lower limb: Secondary | ICD-10-CM | POA: Diagnosis not present

## 2024-03-17 DIAGNOSIS — D751 Secondary polycythemia: Secondary | ICD-10-CM | POA: Diagnosis not present

## 2024-03-17 LAB — CBC WITH DIFFERENTIAL/PLATELET
Abs Immature Granulocytes: 0.04 K/uL (ref 0.00–0.07)
Basophils Absolute: 0.1 K/uL (ref 0.0–0.1)
Basophils Relative: 1 %
Eosinophils Absolute: 0.1 K/uL (ref 0.0–0.5)
Eosinophils Relative: 1 %
HCT: 46.3 % — ABNORMAL HIGH (ref 36.0–46.0)
Hemoglobin: 15.6 g/dL — ABNORMAL HIGH (ref 12.0–15.0)
Immature Granulocytes: 0 %
Lymphocytes Relative: 23 %
Lymphs Abs: 2.6 K/uL (ref 0.7–4.0)
MCH: 27.6 pg (ref 26.0–34.0)
MCHC: 33.7 g/dL (ref 30.0–36.0)
MCV: 81.9 fL (ref 80.0–100.0)
Monocytes Absolute: 0.8 K/uL (ref 0.1–1.0)
Monocytes Relative: 7 %
Neutro Abs: 7.6 K/uL (ref 1.7–7.7)
Neutrophils Relative %: 68 %
Platelets: 297 K/uL (ref 150–400)
RBC: 5.65 MIL/uL — ABNORMAL HIGH (ref 3.87–5.11)
RDW: 12.4 % (ref 11.5–15.5)
WBC: 11.1 K/uL — ABNORMAL HIGH (ref 4.0–10.5)
nRBC: 0 % (ref 0.0–0.2)

## 2024-03-17 LAB — IRON AND TIBC
Iron: 56 ug/dL (ref 28–170)
Saturation Ratios: 15 % (ref 10.4–31.8)
TIBC: 383 ug/dL (ref 250–450)
UIBC: 327 ug/dL

## 2024-03-17 LAB — FERRITIN: Ferritin: 136 ng/mL (ref 11–307)

## 2024-03-22 ENCOUNTER — Observation Stay (HOSPITAL_COMMUNITY)
Admission: EM | Admit: 2024-03-22 | Discharge: 2024-03-23 | Disposition: A | Discharge status: Home / Self Care | Attending: Internal Medicine | Admitting: Internal Medicine

## 2024-03-22 ENCOUNTER — Other Ambulatory Visit: Payer: Self-pay

## 2024-03-22 ENCOUNTER — Encounter (HOSPITAL_COMMUNITY): Payer: Self-pay

## 2024-03-22 DIAGNOSIS — Z6837 Body mass index (BMI) 37.0-37.9, adult: Secondary | ICD-10-CM | POA: Insufficient documentation

## 2024-03-22 DIAGNOSIS — Z720 Tobacco use: Secondary | ICD-10-CM | POA: Diagnosis not present

## 2024-03-22 DIAGNOSIS — G4733 Obstructive sleep apnea (adult) (pediatric): Secondary | ICD-10-CM | POA: Insufficient documentation

## 2024-03-22 DIAGNOSIS — E876 Hypokalemia: Secondary | ICD-10-CM | POA: Diagnosis not present

## 2024-03-22 DIAGNOSIS — E66812 Obesity, class 2: Secondary | ICD-10-CM | POA: Diagnosis not present

## 2024-03-22 DIAGNOSIS — N179 Acute kidney failure, unspecified: Secondary | ICD-10-CM

## 2024-03-22 DIAGNOSIS — Z794 Long term (current) use of insulin: Secondary | ICD-10-CM | POA: Insufficient documentation

## 2024-03-22 DIAGNOSIS — F17220 Nicotine dependence, chewing tobacco, uncomplicated: Secondary | ICD-10-CM | POA: Diagnosis not present

## 2024-03-22 DIAGNOSIS — R739 Hyperglycemia, unspecified: Secondary | ICD-10-CM | POA: Diagnosis present

## 2024-03-22 DIAGNOSIS — I10 Essential (primary) hypertension: Secondary | ICD-10-CM | POA: Diagnosis not present

## 2024-03-22 DIAGNOSIS — K219 Gastro-esophageal reflux disease without esophagitis: Secondary | ICD-10-CM | POA: Insufficient documentation

## 2024-03-22 DIAGNOSIS — F17210 Nicotine dependence, cigarettes, uncomplicated: Secondary | ICD-10-CM | POA: Insufficient documentation

## 2024-03-22 DIAGNOSIS — F419 Anxiety disorder, unspecified: Secondary | ICD-10-CM | POA: Diagnosis not present

## 2024-03-22 DIAGNOSIS — E860 Dehydration: Secondary | ICD-10-CM | POA: Insufficient documentation

## 2024-03-22 DIAGNOSIS — E119 Type 2 diabetes mellitus without complications: Secondary | ICD-10-CM | POA: Diagnosis not present

## 2024-03-22 DIAGNOSIS — E1100 Type 2 diabetes mellitus with hyperosmolarity without nonketotic hyperglycemic-hyperosmolar coma (NKHHC): Principal | ICD-10-CM | POA: Diagnosis present

## 2024-03-22 DIAGNOSIS — E1165 Type 2 diabetes mellitus with hyperglycemia: Secondary | ICD-10-CM | POA: Diagnosis not present

## 2024-03-22 LAB — GLUCOSE, CAPILLARY
Glucose-Capillary: 421 mg/dL — ABNORMAL HIGH (ref 70–99)
Glucose-Capillary: 494 mg/dL — ABNORMAL HIGH (ref 70–99)
Glucose-Capillary: 344 mg/dL — ABNORMAL HIGH (ref 70–99)
Glucose-Capillary: 296 mg/dL — ABNORMAL HIGH (ref 70–99)
Glucose-Capillary: 207 mg/dL — ABNORMAL HIGH (ref 70–99)
Glucose-Capillary: 192 mg/dL — ABNORMAL HIGH (ref 70–99)
Glucose-Capillary: 186 mg/dL — ABNORMAL HIGH (ref 70–99)
Glucose-Capillary: 202 mg/dL — ABNORMAL HIGH (ref 70–99)
Glucose-Capillary: 245 mg/dL — ABNORMAL HIGH (ref 70–99)
Glucose-Capillary: 221 mg/dL — ABNORMAL HIGH (ref 70–99)
Glucose-Capillary: 308 mg/dL — ABNORMAL HIGH (ref 70–99)
Glucose-Capillary: 330 mg/dL — ABNORMAL HIGH (ref 70–99)

## 2024-03-22 LAB — I-STAT CHEM 8, ED
BUN: 24 mg/dL — ABNORMAL HIGH (ref 6–20)
Calcium, Ion: 1.19 mmol/L (ref 1.15–1.40)
Chloride: 88 mmol/L — ABNORMAL LOW (ref 98–111)
Creatinine, Ser: 1.1 mg/dL — ABNORMAL HIGH (ref 0.44–1.00)
Glucose, Bld: >700 mg/dL (ref 70–99)
HCT: 52 % — ABNORMAL HIGH (ref 36.0–46.0)
Hemoglobin: 17.7 g/dL — ABNORMAL HIGH (ref 12.0–15.0)
Potassium: 4.2 mmol/L (ref 3.5–5.1)
Sodium: 127 mmol/L — ABNORMAL LOW (ref 135–145)
TCO2: 26 mmol/L (ref 22–32)

## 2024-03-22 LAB — BASIC METABOLIC PANEL WITH GFR
Anion gap: 11 (ref 5–15)
Anion gap: 11 (ref 5–15)
BUN: 17 mg/dL (ref 6–20)
BUN: 14 mg/dL (ref 6–20)
CO2: 24 mmol/L (ref 22–32)
CO2: 26 mmol/L (ref 22–32)
Calcium: 9.6 mg/dL (ref 8.9–10.3)
Calcium: 9.7 mg/dL (ref 8.9–10.3)
Chloride: 97 mmol/L — ABNORMAL LOW (ref 98–111)
Chloride: 100 mmol/L (ref 98–111)
Creatinine, Ser: 1 mg/dL (ref 0.44–1.00)
Creatinine, Ser: 0.77 mg/dL (ref 0.44–1.00)
GFR, Estimated: >60 mL/min (ref >60)
GFR, Estimated: >60 mL/min (ref >60)
Glucose, Bld: 347 mg/dL — ABNORMAL HIGH (ref 70–99)
Glucose, Bld: 199 mg/dL — ABNORMAL HIGH (ref 70–99)
Potassium: 3.8 mmol/L (ref 3.5–5.1)
Potassium: 3.6 mmol/L (ref 3.5–5.1)
Sodium: 132 mmol/L — ABNORMAL LOW (ref 135–145)
Sodium: 137 mmol/L (ref 135–145)

## 2024-03-22 LAB — URINALYSIS, ROUTINE W REFLEX MICROSCOPIC
Bilirubin Urine: NEGATIVE
Glucose, UA: >=500 mg/dL — AB
Hgb urine dipstick: NEGATIVE
Ketones, ur: NEGATIVE mg/dL
Leukocytes,Ua: NEGATIVE
Nitrite: NEGATIVE
Protein, ur: NEGATIVE mg/dL
Specific Gravity, Urine: 1.027 (ref 1.005–1.030)
pH: 5 (ref 5.0–8.0)

## 2024-03-22 LAB — CBG MONITORING, ED
Glucose-Capillary: >600 mg/dL (ref 70–99)
Glucose-Capillary: >600 mg/dL (ref 70–99)
Glucose-Capillary: >600 mg/dL (ref 70–99)
Glucose-Capillary: >600 mg/dL (ref 70–99)
Glucose-Capillary: >600 mg/dL (ref 70–99)

## 2024-03-22 LAB — CBC
HCT: 49.4 % — ABNORMAL HIGH (ref 36.0–46.0)
Hemoglobin: 16.7 g/dL — ABNORMAL HIGH (ref 12.0–15.0)
MCH: 27.9 pg (ref 26.0–34.0)
MCHC: 33.8 g/dL (ref 30.0–36.0)
MCV: 82.5 fL (ref 80.0–100.0)
Platelets: 297 K/uL (ref 150–400)
RBC: 5.99 MIL/uL — ABNORMAL HIGH (ref 3.87–5.11)
RDW: 12.6 % (ref 11.5–15.5)
WBC: 8.5 K/uL (ref 4.0–10.5)
nRBC: 0 % (ref 0.0–0.2)

## 2024-03-22 LAB — BLOOD GAS, VENOUS
Acid-Base Excess: 1 mmol/L (ref 0.0–2.0)
Bicarbonate: 26.6 mmol/L (ref 20.0–28.0)
Drawn by: 4237
O2 Saturation: 85.4 %
Patient temperature: 36.5
pCO2, Ven: 44 mmHg (ref 44–60)
pH, Ven: 7.39 (ref 7.25–7.43)
pO2, Ven: 47 mmHg — ABNORMAL HIGH (ref 32–45)

## 2024-03-22 LAB — HEMOGLOBIN A1C
Hgb A1c MFr Bld: 13.1 % — ABNORMAL HIGH (ref 4.8–5.6)
Mean Plasma Glucose: 329.27 mg/dL

## 2024-03-22 LAB — COMPREHENSIVE METABOLIC PANEL WITH GFR
ALT: 68 U/L — ABNORMAL HIGH (ref 0–44)
AST: 60 U/L — ABNORMAL HIGH (ref 15–41)
Albumin: 4.4 g/dL (ref 3.5–5.0)
Alkaline Phosphatase: 124 U/L (ref 38–126)
Anion gap: 13 (ref 5–15)
BUN: 22 mg/dL — ABNORMAL HIGH (ref 6–20)
CO2: 25 mmol/L (ref 22–32)
Calcium: 9.9 mg/dL (ref 8.9–10.3)
Chloride: 85 mmol/L — ABNORMAL LOW (ref 98–111)
Creatinine, Ser: 1.24 mg/dL — ABNORMAL HIGH (ref 0.44–1.00)
GFR, Estimated: 52 mL/min — ABNORMAL LOW (ref >60)
Glucose, Bld: 952 mg/dL (ref 70–99)
Potassium: 3.9 mmol/L (ref 3.5–5.1)
Sodium: 123 mmol/L — ABNORMAL LOW (ref 135–145)
Total Bilirubin: 0.6 mg/dL (ref 0.0–1.2)
Total Protein: 8 g/dL (ref 6.5–8.1)

## 2024-03-22 LAB — HIV ANTIBODY (ROUTINE TESTING W REFLEX): HIV Screen 4th Generation wRfx: NONREACTIVE

## 2024-03-22 LAB — OSMOLALITY
Osmolality: 327 mosm/kg (ref 275–295)
Osmolality: 302 mosm/kg — ABNORMAL HIGH (ref 275–295)

## 2024-03-22 LAB — BETA-HYDROXYBUTYRIC ACID
Beta-Hydroxybutyric Acid: 0.31 mmol/L — ABNORMAL HIGH (ref 0.05–0.27)
Beta-Hydroxybutyric Acid: 0.18 mmol/L (ref 0.05–0.27)
Beta-Hydroxybutyric Acid: 0.15 mmol/L (ref 0.05–0.27)

## 2024-03-22 LAB — MRSA NEXT GEN BY PCR, NASAL: MRSA by PCR Next Gen: NOT DETECTED

## 2024-03-22 MED ORDER — GABAPENTIN 300 MG PO CAPS
300.0000 mg | ORAL_CAPSULE | Freq: Every day | ORAL | Status: DC
  Administered 2024-03-22: 300 mg via ORAL
  Filled 2024-03-22: qty 1

## 2024-03-22 MED ORDER — INSULIN ASPART 100 UNIT/ML IJ SOLN
3.0000 [IU] | Freq: Three times a day (TID) | INTRAMUSCULAR | Status: DC
  Administered 2024-03-23: 3 [IU] via SUBCUTANEOUS

## 2024-03-22 MED ORDER — FERROUS SULFATE 325 (65 FE) MG PO TABS
325.0000 mg | ORAL_TABLET | Freq: Every day | ORAL | Status: DC
  Administered 2024-03-23: 325 mg via ORAL
  Filled 2024-03-22: qty 1

## 2024-03-22 MED ORDER — PANTOPRAZOLE SODIUM 40 MG PO TBEC
40.0000 mg | DELAYED_RELEASE_TABLET | Freq: Two times a day (BID) | ORAL | Status: DC
  Administered 2024-03-22 – 2024-03-23 (×2): 40 mg via ORAL
  Filled 2024-03-22 (×2): qty 1

## 2024-03-22 MED ORDER — ENOXAPARIN SODIUM 40 MG/0.4ML IJ SOSY
40.0000 mg | PREFILLED_SYRINGE | INTRAMUSCULAR | Status: DC
  Administered 2024-03-22: 40 mg via SUBCUTANEOUS
  Filled 2024-03-22: qty 0.4

## 2024-03-22 MED ORDER — INSULIN ASPART 100 UNIT/ML IJ SOLN: 0.0000 [IU] | INTRAMUSCULAR | Status: DC

## 2024-03-22 MED ORDER — BACLOFEN 10 MG PO TABS
10.0000 mg | ORAL_TABLET | Freq: Three times a day (TID) | ORAL | Status: DC | PRN
Start: 2024-03-22 — End: 2024-03-23

## 2024-03-22 MED ORDER — HYDRALAZINE HCL 20 MG/ML IJ SOLN: 5.0000 mg | Freq: Three times a day (TID) | INTRAMUSCULAR | Status: DC | PRN

## 2024-03-22 MED ORDER — ATORVASTATIN CALCIUM 10 MG PO TABS
10.0000 mg | ORAL_TABLET | Freq: Every day | ORAL | Status: DC
Start: 2024-03-22 — End: 2024-03-23
  Administered 2024-03-22 – 2024-03-23 (×2): 10 mg via ORAL
  Filled 2024-03-22 (×2): qty 1

## 2024-03-22 MED ORDER — ATENOLOL 25 MG PO TABS
25.0000 mg | ORAL_TABLET | Freq: Every day | ORAL | Status: DC
  Administered 2024-03-23: 25 mg via ORAL
  Filled 2024-03-22: qty 1

## 2024-03-22 MED ORDER — LACTATED RINGERS IV BOLUS
1000.0000 mL | Freq: Once | INTRAVENOUS | Status: AC
  Administered 2024-03-22: 1000 mL via INTRAVENOUS

## 2024-03-22 MED ORDER — DEXTROSE IN LACTATED RINGERS 5 % IV SOLN: INTRAVENOUS | Status: DC

## 2024-03-22 MED ORDER — ARIPIPRAZOLE 10 MG PO TABS
10.0000 mg | ORAL_TABLET | Freq: Every day | ORAL | Status: DC
  Administered 2024-03-22 – 2024-03-23 (×2): 10 mg via ORAL
  Filled 2024-03-22 (×2): qty 1

## 2024-03-22 MED ORDER — INSULIN REGULAR(HUMAN) IN NACL 100-0.9 UT/100ML-% IV SOLN
INTRAVENOUS | Status: DC
  Administered 2024-03-22: 10.5 [IU]/h via INTRAVENOUS
  Filled 2024-03-22: qty 100

## 2024-03-22 MED ORDER — SPIRONOLACTONE 12.5 MG HALF TABLET
12.5000 mg | ORAL_TABLET | Freq: Every day | ORAL | Status: DC
Start: 2024-03-22 — End: 2024-03-23
  Administered 2024-03-22 – 2024-03-23 (×2): 12.5 mg via ORAL
  Filled 2024-03-22 (×2): qty 1

## 2024-03-22 MED ORDER — POTASSIUM CHLORIDE 10 MEQ/100ML IV SOLN
10.0000 meq | INTRAVENOUS | Status: AC
  Administered 2024-03-22 (×2): 10 meq via INTRAVENOUS
  Filled 2024-03-22 (×2): qty 100

## 2024-03-22 MED ORDER — ATENOLOL 25 MG PO TABS
25.0000 mg | ORAL_TABLET | Freq: Every day | ORAL | Status: DC
  Administered 2024-03-22: 25 mg via ORAL
  Filled 2024-03-22: qty 1

## 2024-03-22 MED ORDER — ORAL CARE MOUTH RINSE: 15.0000 mL | OROMUCOSAL | Status: DC | PRN

## 2024-03-22 MED ORDER — INSULIN ASPART 100 UNIT/ML IJ SOLN
0.0000 [IU] | Freq: Every day | INTRAMUSCULAR | Status: DC
  Administered 2024-03-22: 4 [IU] via SUBCUTANEOUS

## 2024-03-22 MED ORDER — DULOXETINE HCL 30 MG PO CPEP
30.0000 mg | ORAL_CAPSULE | Freq: Every day | ORAL | Status: DC
  Administered 2024-03-22: 30 mg via ORAL
  Filled 2024-03-22: qty 1

## 2024-03-22 MED ORDER — INSULIN REGULAR(HUMAN) IN NACL 100-0.9 UT/100ML-% IV SOLN: INTRAVENOUS | Status: DC

## 2024-03-22 MED ORDER — LACTATED RINGERS IV SOLN: INTRAVENOUS | Status: DC

## 2024-03-22 MED ORDER — TRAZODONE HCL 50 MG PO TABS
100.0000 mg | ORAL_TABLET | Freq: Every day | ORAL | Status: DC
  Administered 2024-03-22: 100 mg via ORAL
  Filled 2024-03-22: qty 2

## 2024-03-22 MED ORDER — CHLORHEXIDINE GLUCONATE CLOTH 2 % EX PADS
6.0000 | MEDICATED_PAD | Freq: Every day | CUTANEOUS | Status: DC
  Administered 2024-03-22 – 2024-03-23 (×2): 6 via TOPICAL

## 2024-03-22 MED ORDER — DEXTROSE 50 % IV SOLN: 0.0000 mL | INTRAVENOUS | Status: DC | PRN

## 2024-03-22 MED ORDER — INSULIN ASPART 100 UNIT/ML IJ SOLN
0.0000 [IU] | Freq: Three times a day (TID) | INTRAMUSCULAR | Status: DC
  Administered 2024-03-23: 7 [IU] via SUBCUTANEOUS
  Administered 2024-03-23: 9 [IU] via SUBCUTANEOUS

## 2024-03-22 MED ORDER — POTASSIUM CHLORIDE 10 MEQ/100ML IV SOLN
10.0000 meq | INTRAVENOUS | Status: DC
  Administered 2024-03-22 (×2): 10 meq via INTRAVENOUS
  Filled 2024-03-22 (×2): qty 100

## 2024-03-22 MED ORDER — ACETAMINOPHEN 325 MG PO TABS: 650.0000 mg | ORAL_TABLET | Freq: Four times a day (QID) | ORAL | Status: DC | PRN

## 2024-03-22 MED ORDER — INSULIN GLARGINE-YFGN 100 UNIT/ML ~~LOC~~ SOLN
20.0000 [IU] | Freq: Every day | SUBCUTANEOUS | Status: DC
  Administered 2024-03-22 – 2024-03-23 (×2): 20 [IU] via SUBCUTANEOUS
  Filled 2024-03-22 (×4): qty 0.2

## 2024-03-22 MED ORDER — ATENOLOL 25 MG PO TABS: 25.0000 mg | ORAL_TABLET | Freq: Two times a day (BID) | ORAL | Status: DC

## 2024-03-22 MED ORDER — LACTATED RINGERS IV SOLN: INTRAVENOUS | Status: AC

## 2024-03-22 NOTE — ED Triage Notes (Signed)
 Pt c/o high cbg readings on her dexon over 300. Pt states having vision problems and generalized weakness.

## 2024-03-22 NOTE — Progress Notes (Signed)
 CRITICAL VALUE STICKER  CRITICAL VALUE: Glucose 347  RECEIVER (on-site recipient of call): Heron Rosaline Buttery, RN   DATE & TIME NOTIFIED: 03/22/24 at 1222  MESSENGER (representative from lab): Abigail  MD NOTIFIED: Dr. Alm Tat  TIME OF NOTIFICATION: 03/22/24 at 1222  RESPONSE: pending

## 2024-03-22 NOTE — Inpatient Diabetes Management (Addendum)
 Inpatient Diabetes Program Recommendations  AACE/ADA: New Consensus Statement on Inpatient Glycemic Control  Target Ranges:  Prepandial:   less than 140 mg/dL      Peak postprandial:   less than 180 mg/dL (1-2 hours)      Critically ill patients:  140 - 180 mg/dL    Latest Reference Range & Units 03/22/24 07:16 03/22/24 08:39 03/22/24 09:15 03/22/24 09:50  Glucose-Capillary 70 - 99 mg/dL >399 (HH) >399 (HH) >399 (HH) >600 (HH)    Latest Reference Range & Units 03/22/24 07:27  CO2 22 - 32 mmol/L 25  Glucose 70 - 99 mg/dL 70 - 99 mg/dL 047 (HH) >299 (HH)  Anion gap 5 - 15  13   Review of Glycemic Control  Diabetes history: DM2 Outpatient Diabetes medications: Lantus  30 units at bedtime, Ozempic  0.25 mg Qweek Current orders for Inpatient glycemic control: IV insulin   Inpatient Diabetes Program Recommendations:    Insulin : IV insulin  has just been ordered and started.  Outpatient DM: Patient may need to resume Humalog  outpatient. May need to consider changing frequency of Lantus  to help it fit into her daily routine to help with compliance of taking it daily.   NOTE: Patient with DM2 hx admitted with [facility], lab glucose 952 mg/dl on 2/71/74. 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  Patient sees Suzanne Rings, NP and was last seen on 10/01/23. Per office note on 10/01/23, patient was advised to decrease Lantus  to 30 units at bedtime and stop meal time insulin , and continue Ozempic  0.5 mg Qweek. Also noted patient seen M. Crumpton, RD on 02/05/24 at [facility].  Diabetes coordinator will plan to talk with patient regarding DM on 03/23/24 when on [facility] campus.  Thanks, Earnie Gainer, RN, MSN, CDCES Diabetes Coordinator Inpatient Diabetes Program [phone] (Team Pager from 8am to 5pm)

## 2024-03-22 NOTE — Discharge Summary (Incomplete)
 Physician Discharge Summary   Patient: Suzanne Lawrence MRN: [id number] DOB: 1970-11-13  Admit date:     03/22/2024  Discharge date: {dischdate:26783}  Discharge Physician: Alm Bryson Palen   PCP: Trudy Vaughn FALCON, MD   Recommendations at discharge:   Please follow up with primary care provider within 1-2 weeks  Please repeat BMP and CBC in one week    Hospital Course: 53 year old female with a history of diabetes mellitus type 2, hypertension, anxiety, OSA, iron deficiency, tobacco abuse, left arm DVT presenting with hyperglycemia.  She has had some generalized weakness for the past 2 days.  She complains of polydipsia and polyuria.  She denies any fevers, chills, headache, chest pain, shortness breath, cough, hemoptysis, vomiting, diarrhea, abdominal pain, hematochezia, melena.  She has some nausea.  Oral intake has been poor.  Patient states that she just recently started a new job.  She works night shift.  Because of her work schedule, she has been finding difficult to take her Lantus  insulin  regularly.  She states that her CBGs have been running  high for the past 2 days.  She states that it was running high despite taking her Lantus  in the morning of 03/21/2024.  She does not take any insulin  coverage with her meals because it was causing low sugars.  She takes Ozempic  on Tuesdays.  She denies any alcohol or illegal drug use.  She smokes 1/2 pack/day.  In the ED, the patient was afebrile and hemodynamically stable with oxygen saturation 96% on room air.  WBC 8.5, hemoglobin 17.7, platelets 297.  Sodium 123, potassium 3.9, bicarbonate 25, serum creatinine 1.24.  AST 60, ALT 60, alk phosphatase 124, total bilirubin 0.6.  UA was negative for pyuria and negative for ketones.  VBG showed a 7.3 9/44/47/26.  The patient was started on IV fluids and IV insulin .  Assessment and Plan: Hyperosmolar Hyperglycemic nonketotic state -patient started on IV insulin  with q 1 hour CBG check and q 4 hour  BMPs -pt started on aggressive fluid resuscitation -Electrolytes were monitored and repleted -transitioned to  insulin  once anion gap closed -diet was advanced once anion gap closed -7/28 HbA1C--13.1   AKI -due to volume depletion -baseline creatinine 0.7-0.9 -lifestyle modification   Essential hypertension -Holding lisinopril  and chlorthalidone temporarily -Continue atenolol  - Continue spironolactone    Anxiety - Continue Abilify  and trazodone  and Cymbalta    Class II obesity - BMI 37.29 - Lifestyle modification   GERD - Continue pantoprazole    Tobacco abuse - Tobacco cessation discussed   OSA - Continue CPAP      {Tip this will not be part of the note when signed Body mass index is 33.78 kg/m. , ,  (Optional):26781}  {(NOTE) Pain control PDMP Statment (Optional):26782} Consultants: *** Procedures performed: ***  Disposition: {Plan; Disposition:26390} Diet recommendation:  {Diet_Plan:26776} DISCHARGE MEDICATION: Allergies as of 03/22/2024       Reactions   Ferumoxytol  Other (See Comments)   Hot flash, nausea, chest pressure    Hydrochlorothiazide Other (See Comments)   urinary frequency; was going every 20 minutes and was causing problems at work     Med Rec must be completed prior to using this [facility]***       Discharge Exam: Filed Weights   03/22/24 0715 03/22/24 1000  Weight: 90.7 kg 81.1 kg   ***  Condition at discharge: {DC Condition:26389}  The results of significant diagnostics from this hospitalization (including imaging, microbiology, ancillary and laboratory) are listed below for reference.   Imaging Studies: No results  found.  Microbiology: Results for orders placed or performed during the hospital encounter of 03/22/24  MRSA Next Gen by PCR, Nasal     Status: None   Collection Time: 03/22/24 10:12 AM   Specimen: Nasal Mucosa; Nasal Swab  Result Value Ref Range Status   MRSA by PCR Next Gen NOT DETECTED NOT DETECTED  Final    Comment: (NOTE) The GeneXpert MRSA Assay (FDA approved for NASAL specimens only), is one component of a comprehensive MRSA colonization surveillance program. It is not intended to diagnose MRSA infection nor to guide or monitor treatment for MRSA infections. Test performance is not FDA approved in patients less than 48 years old. Performed at [facility], [address] [place], [place] [address]     Labs: CBC: Recent Labs  Lab 03/17/24 1357 03/22/24 0727  WBC 11.1* 8.5  NEUTROABS 7.6  --   HGB 15.6* 17.7*  16.7*  HCT 46.3* 52.0*  49.4*  MCV 81.9 82.5  PLT 297 297   Basic Metabolic Panel: Recent Labs  Lab 03/22/24 0727 03/22/24 1157 03/22/24 1615  NA 123*  127* 132* 137  K 3.9  4.2 3.8 3.6  CL 85*  88* 97* 100  CO2 25 24 26   GLUCOSE 952*  >700* 347* 199*  BUN 22*  24* 17 14  CREATININE 1.24*  1.10* 1.00 0.77  CALCIUM  9.9 9.6 9.7   Liver Function Tests: Recent Labs  Lab 03/22/24 0727  AST 60*  ALT 68*  ALKPHOS 124  BILITOT 0.6  PROT 8.0  ALBUMIN 4.4   CBG: Recent Labs  Lab 03/22/24 1352 03/22/24 1454 03/22/24 1553 03/22/24 1656 03/22/24 1756  GLUCAP 207* 192* 186* 202* 245*    Discharge time spent: {LESS THAN/GREATER THAN:26388} 30 minutes.  Signed: Alm Schneider, MD [facility] 03/22/2024

## 2024-03-22 NOTE — Plan of Care (Signed)
  Problem: Education: Goal: Knowledge of General Education information will improve Description: Including pain rating scale, medication(s)/side effects and non-pharmacologic comfort measures Outcome: Progressing   Problem: Health Behavior/Discharge Planning: Goal: Ability to manage health-related needs will improve Outcome: Progressing   Problem: Clinical Measurements: Goal: Ability to maintain clinical measurements within normal limits will improve Outcome: Progressing Goal: Will remain free from infection Outcome: Progressing Goal: Diagnostic test results will improve Outcome: Progressing Goal: Respiratory complications will improve Outcome: Progressing Goal: Cardiovascular complication will be avoided Outcome: Progressing   Problem: Activity: Goal: Risk for activity intolerance will decrease Outcome: Progressing   Problem: Coping: Goal: Level of anxiety will decrease Outcome: Progressing   Problem: Elimination: Goal: Will not experience complications related to urinary retention Outcome: Progressing   Problem: Pain Managment: Goal: General experience of comfort will improve and/or be controlled Outcome: Progressing   Problem: Safety: Goal: Ability to remain free from injury will improve Outcome: Progressing   Problem: Skin Integrity: Goal: Risk for impaired skin integrity will decrease Outcome: Progressing

## 2024-03-22 NOTE — H&P (Addendum)
 History and Physical    Patient: Suzanne Lawrence FMW:[id number] DOB: [birth year] DOA: 03/22/2024 DOS: the patient was seen and examined on 03/22/2024 PCP: Trudy Vaughn FALCON, MD  Patient coming from: Home  Chief Complaint:  Chief Complaint  Patient presents with   Hyperglycemia   HPI: Suzanne Lawrence is a 53 year old female with a history of diabetes mellitus type 2, hypertension, anxiety, OSA, iron deficiency, tobacco abuse, left arm DVT presenting with hyperglycemia.  She has had some generalized weakness for the past 2 days.  She complains of polydipsia and polyuria.  She denies any fevers, chills, headache, chest pain, shortness breath, cough, hemoptysis, vomiting, diarrhea, abdominal pain, hematochezia, melena.  She has some nausea.  Oral intake has been poor.  Patient states that she just recently started a new job.  She works night shift.  Because of her work schedule, she has been finding difficult to take her Lantus  insulin  regularly.  She states that her CBGs have been running  high for the past 2 days.  She states that it was running high despite taking her Lantus  in the morning of 03/21/2024.  She does not take any insulin  coverage with her meals because it was causing low sugars.  She takes Ozempic  on Tuesdays.  She denies any alcohol or illegal drug use.  She smokes 1/2 pack/day.  In the ED, the patient was afebrile and hemodynamically stable with oxygen saturation 96% on room air.  WBC 8.5, hemoglobin 17.7, platelets 297.  Sodium 123, potassium 3.9, bicarbonate 25, serum creatinine 1.24.  AST 60, ALT 60, alk phosphatase 124, total bilirubin 0.6.  UA was negative for pyuria and negative for ketones.  VBG showed a 7.3 9/44/47/26.  The patient was started on IV fluids and IV insulin .  Review of Systems: As mentioned in the history of present illness. All other systems reviewed and are negative. Past Medical History:  Diagnosis Date   Abnormal Pap smear of cervix    Anemia    issue  with red blood cells being small   Anxiety    Arthritis    Carpal tunnel syndrome    right   Depression    Diabetes mellitus without complication (HCC)    DVT (deep venous thrombosis) (HCC) 03/2021   left arm   GERD (gastroesophageal reflux disease)    High blood pressure    History of hiatal hernia    Left breast mass    Pre-diabetes    Past Surgical History:  Procedure Laterality Date   BIOPSY  02/05/2022   Procedure: BIOPSY;  Surgeon: Cindie Carlin POUR, DO;  [facility];  Service: Endoscopy;;   COLONOSCOPY  2022   Megan Straughan: 7 polyps removed, has to return in one year   COLONOSCOPY  09/2021   Megan Straughan: 12 polyps removed   ESOPHAGOGASTRODUODENOSCOPY (EGD) WITH PROPOFOL  N/A 02/05/2022   Procedure: ESOPHAGOGASTRODUODENOSCOPY (EGD) WITH PROPOFOL ;  Surgeon: Cindie Carlin POUR, DO;  [facility]: [facility];  Service: Endoscopy;  Laterality: N/A;  2:00pm   KNEE ARTHROSCOPY Right 10/28/2022   Procedure: RIGHT KNEE ARTHROSCOPY, DEBRIDEMENT;  Surgeon: Addie Cordella Hamilton, MD;  Location: [facility];  Service: Orthopedics;  Laterality: Right;   left shoulder arthroscopy  12/2020   SHOULDER ARTHROSCOPY WITH OPEN ROTATOR CUFF REPAIR AND DISTAL CLAVICLE ACROMINECTOMY Left 03/21/2022   Procedure: LEFT SHOULDER ARTHOSCOPY, DEBRIDEMENT,  MANIPULATION, ROTATOR CUFF REPAIR, ARTHROSCOPIC DISTAL CLAVICLE EXCISION;  Surgeon: Addie Cordella Hamilton, MD;  Location: [facility];  Service: Orthopedics;  Laterality: Left;  TUBAL LIGATION     Social History:  reports that she has been smoking cigarettes. She has never used smokeless tobacco. She reports that she does not currently use alcohol. She reports that she does not currently use drugs.  Allergies  Allergen Reactions   Ferumoxytol  Other (See Comments)    Hot flash, nausea, chest pressure    Hydrochlorothiazide Other (See Comments)     urinary frequency; was going every 20 minutes and was causing problems at work     Family History  Problem Relation Age of Onset   Lung cancer Mother    Other Father        multiple issues, doesn't know history for sure   Prostate cancer Father    Stroke Father    Aneurysm Paternal Grandmother    Breast cancer Maternal Aunt    Prostate cancer Maternal Uncle    Alcoholism Maternal Uncle    Aneurysm Cousin        paternal side   Aneurysm Cousin        paternal side    Breast cancer Cousin        maternal side    High blood pressure Other        father's side    Cerebral aneurysm Other        cerebral, on father's side    Alcoholism Other        maternal side    Colon cancer Neg Hx     Prior to Admission medications   Medication Sig Start Date End Date Taking? Authorizing Provider  amoxicillin (AMOXIL) 500 MG capsule Take 500 mg by mouth 3 (three) times daily. 12/25/23  Yes [provider]  atorvastatin  (LIPITOR) 10 MG tablet Take 10 mg by mouth daily. 01/14/24  Yes [provider]  CYMBALTA  30 MG capsule Take 30 mg by mouth daily. 03/19/24  Yes [provider]  doxycycline (VIBRAMYCIN) 100 MG capsule Take 100 mg by mouth 2 (two) times daily. 03/17/24  Yes [provider]  fluconazole (DIFLUCAN) 100 MG tablet Take 100 mg by mouth daily. 03/21/24  Yes [provider]  HYDROcodone-acetaminophen  (NORCO/VICODIN) 5-325 MG tablet Take 1 tablet by mouth every 4 (four) hours as needed. 12/25/23  Yes [provider]  traZODone  (DESYREL ) 100 MG tablet Take 100 mg by mouth at bedtime. 03/19/24  Yes [provider]  ACCU-CHEK GUIDE test strip Use 1 each to check blood glucose four times daily 06/25/23   Therisa Benton PARAS, NP  Accu-Chek Softclix Lancets lancets 1 each by Other route 4 (four) times daily. 06/25/23   Therisa Benton PARAS, NP  acetaminophen  (TYLENOL ) 325 MG tablet Take 650 mg by mouth every 6 (six) hours as needed for moderate pain.    [provider]  ARIPiprazole  (ABILIFY ) 10 MG tablet Take 10  mg by mouth daily.    [provider]  atenolol  (TENORMIN ) 50 MG tablet Take 50 mg by mouth daily.    [provider]  baclofen  (LIORESAL ) 10 MG tablet Take 10 mg by mouth 3 (three) times daily as needed (Back pain).    [provider]  Blood Glucose Monitoring Suppl (ACCU-CHEK GUIDE ME) w/Device KIT See admin instructions. 08/14/22   [provider]  chlorthalidone (HYGROTON) 25 MG tablet Take 25 mg by mouth every morning. 10/31/19   [provider]  Continuous Glucose Sensor (DEXCOM G7 SENSOR) MISC Use to continuously monitor glucose. Change sensor every 10 days. 10/01/23   Therisa Benton PARAS,  NP  DULoxetine  (CYMBALTA ) 60 MG capsule Take 60 mg by mouth daily in the afternoon. 02/15/21   [provider]  ferrous sulfate  325 (65 FE) MG EC tablet Take 1 tablet (325 mg total) by mouth daily with breakfast. 09/24/23   Lamon Pleasant HERO, PA-C  furosemide  (LASIX ) 20 MG tablet Take 1 tablet (20 mg total) by mouth daily as needed. 04/24/23   Mallipeddi, Vishnu P, MD  gabapentin  (NEURONTIN ) 300 MG capsule Take 300 mg by mouth at bedtime. 03/26/21   [provider]  insulin  glargine (LANTUS  SOLOSTAR) 100 UNIT/ML Solostar Pen Inject 30 Units into the skin at bedtime. 10/01/23   Therisa Benton PARAS, NP  lisinopril  (ZESTRIL ) 40 MG tablet Take 40 mg by mouth daily. 08/26/22   [provider]  meclizine (ANTIVERT) 25 MG tablet Take 25 mg by mouth 3 (three) times daily as needed for dizziness.    [provider]  OZEMPIC , 0.25 OR 0.5 MG/DOSE, 2 MG/3ML SOPN SMARTSIG:0.25 Milligram(s) SUB-Q Once a Week    [provider]  pantoprazole  (PROTONIX ) 40 MG tablet Take 1 tablet (40 mg total) by mouth 2 (two) times daily before a meal. 11/13/23   Kennedy Charmaine CROME, NP  spironolactone  (ALDACTONE ) 25 MG tablet Take 12.5 mg by mouth daily. 01/30/23   [provider]  traZODone  (DESYREL ) 50 MG tablet Take 50-100 mg by mouth at bedtime. 10/14/22    [provider]    Physical Exam: Vitals:   03/22/24 0845 03/22/24 0900 03/22/24 0945 03/22/24 1000  BP: 138/74 (!) 148/78    Pulse: 75 60 65 73  Resp:   (!) 22 19  Temp:      TempSrc:      SpO2: 96% 93% 97% 98%  Weight:      Height:       GENERAL:  A&O x 3, NAD, well developed, cooperative, follows commands HEENT: [place]/AT, No thrush, No icterus, No oral ulcers Neck:  No neck mass, No meningismus, soft, supple CV: RRR, no S3, no S4, no rub, no JVD Lungs:  CTA, no wheeze, no rhonchi, good air movement Abd: soft/NT +BS, nondistended Ext: No edema, no lymphangitis, no cyanosis, no rashes Neuro:  CN II-XII intact, strength 4/5 in RUE, RLE, strength 4/5 LUE, LLE; sensation intact bilateral; no dysmetria; babinski equivocal  Data Reviewed: Data above in history Assessment and Plan:  Hyperosmolar Hyperglycemic nonketotic state -patient started on IV insulin  with q 1 hour CBG check and q 4 hour BMPs -pt started on aggressive fluid resuscitation -Electrolytes were monitored and repleted -transitioned to [place] insulin  once anion gap closed -diet was advanced once anion gap closed -HbA1C--  AKI -due to volume depletion -baseline creatinine 0.7-0.9 -lifestyle modification  Essential hypertension -Holding lisinopril  and chlorthalidone temporarily -Continue atenolol  - Continue spironolactone   Anxiety - Continue Abilify  and trazodone  and Cymbalta   Class II obesity - BMI 37.29 - Lifestyle modification  GERD - Continue pantoprazole   Tobacco abuse - Tobacco cessation discussed  OSA - Continue CPAP    Advance Care Planning: FULL  Consults: none  Family Communication: none present  Severity of Illness: The appropriate patient status for this patient is OBSERVATION. Observation status is judged to be reasonable and necessary in order to provide the required intensity of service to ensure the patient's safety. The patient's presenting symptoms, physical exam  findings, and initial radiographic and laboratory data in the context of their medical condition is felt to place them at decreased risk for further clinical deterioration. Furthermore,  it is anticipated that the patient will be medically stable for discharge from the hospital within 2 midnights of admission.   Author: Alm Schneider, MD 03/22/2024 10:16 AM  For on call review [email or web address].

## 2024-03-22 NOTE — Hospital Course (Signed)
 53 year old female with a history of diabetes mellitus type 2, hypertension, anxiety, OSA, iron deficiency, tobacco abuse, left arm DVT presenting with hyperglycemia.  She has had some generalized weakness for the past 2 days.  She complains of polydipsia and polyuria.  She denies any fevers, chills, headache, chest pain, shortness breath, cough, hemoptysis, vomiting, diarrhea, abdominal pain, hematochezia, melena.  She has some nausea.  Oral intake has been poor.  Patient states that she just recently started a new job.  She works night shift.  Because of her work schedule, she has been finding difficult to take her Lantus  insulin  regularly.  She states that her CBGs have been running  high for the past 2 days.  She states that it was running high despite taking her Lantus  in the morning of 03/21/2024.  She does not take any insulin  coverage with her meals because it was causing low sugars.  She takes Ozempic  on Tuesdays.  She denies any alcohol or illegal drug use.  She smokes 1/2 pack/day.  In the ED, the patient was afebrile and hemodynamically stable with oxygen saturation 96% on room air.  WBC 8.5, hemoglobin 17.7, platelets 297.  Sodium 123, potassium 3.9, bicarbonate 25, serum creatinine 1.24.  AST 60, ALT 60, alk phosphatase 124, total bilirubin 0.6.  UA was negative for pyuria and negative for ketones.  VBG showed a 7.3 9/44/47/26.  The patient was started on IV fluids and IV insulin .

## 2024-03-22 NOTE — ED Notes (Signed)
 ED Provider at bedside.

## 2024-03-22 NOTE — ED Notes (Signed)
 ED TO INPATIENT HANDOFF REPORT  ED Nurse Name and Phone #: Warren BECKER RN [phone]  S Name/Age/Gender Suzanne Lawrence 53 y.o. female Room/Bed: APA09/APA09  Code Status   Code Status: Not on file  Home/SNF/Other Home Patient oriented to: self, place, time, and situation Is this baseline? Yes   Triage Complete: Triage complete  Chief Complaint Hyperosmolar hyperglycemic state (HHS) (HCC) [E11.00]  Triage Note Pt c/o high cbg readings on her dexon over 300. Pt states having vision problems and generalized weakness.     Allergies Allergies  Allergen Reactions   Ferumoxytol  Other (See Comments)    Hot flash, nausea, chest pressure    Hydrochlorothiazide Other (See Comments)     urinary frequency; was going every 20 minutes and was causing problems at work    Level of Care/Admitting Diagnosis ED Disposition     ED Disposition  Admit   Condition  --   Comment  Hospital Area: [facility] [[id number]]  Level of Care: Stepdown [14]  Covid Evaluation: Asymptomatic - no recent exposure (last 10 days) testing not required  Diagnosis: Hyperosmolar hyperglycemic state (HHS) [facility]) [[id number]]  Admitting Physician: TAT, DAVID [email or web address].Cuna  Attending Physician: TAT, DAVID [[id number]]  For patients discharging to extended facilities (i.e. SNF, AL, group homes or LTAC) initiate:: Discharge to SNF/Facility Placement COVID-19 Lab Testing Protocol          B Medical/Surgery History Past Medical History:  Diagnosis Date   Abnormal Pap smear of cervix    Anemia    issue with red blood cells being small   Anxiety    Arthritis    Carpal tunnel syndrome    right   Depression    Diabetes mellitus without complication (HCC)    DVT (deep venous thrombosis) (HCC) 03/2021   left arm   GERD (gastroesophageal reflux disease)    High blood pressure    History of hiatal hernia    Left breast mass    Pre-diabetes    Past Surgical History:  Procedure Laterality Date   BIOPSY   02/05/2022   Procedure: BIOPSY;  Surgeon: Cindie Carlin POUR, DO;  [facility];  Service: Endoscopy;;   COLONOSCOPY  2022   Megan Straughan: 7 polyps removed, has to return in one year   COLONOSCOPY  09/2021   Megan Straughan: 12 polyps removed   ESOPHAGOGASTRODUODENOSCOPY (EGD) WITH PROPOFOL  N/A 02/05/2022   Procedure: ESOPHAGOGASTRODUODENOSCOPY (EGD) WITH PROPOFOL ;  Surgeon: Cindie Carlin POUR, DO;  [facility];  Service: Endoscopy;  Laterality: N/A;  2:00pm   KNEE ARTHROSCOPY Right 10/28/2022   Procedure: RIGHT KNEE ARTHROSCOPY, DEBRIDEMENT;  Surgeon: Addie Cordella Hamilton, MD;  Location: [facility];  Service: Orthopedics;  Laterality: Right;   left shoulder arthroscopy  12/2020   SHOULDER ARTHROSCOPY WITH OPEN ROTATOR CUFF REPAIR AND DISTAL CLAVICLE ACROMINECTOMY Left 03/21/2022   Procedure: LEFT SHOULDER ARTHOSCOPY, DEBRIDEMENT,  MANIPULATION, ROTATOR CUFF REPAIR, ARTHROSCOPIC DISTAL CLAVICLE EXCISION;  Surgeon: Addie Cordella Hamilton, MD;  Location: [facility];  Service: Orthopedics;  Laterality: Left;   TUBAL LIGATION       A IV Location/Drains/Wounds Patient Lines/Drains/Airways Status     Active Line/Drains/Airways     Name Placement date Placement time Site Days   Peripheral IV 03/22/24 20 G 1 Right Antecubital 03/22/24  0720  Antecubital  less than 1   Airway 10/28/22  0818  -- 511            Intake/Output Last 24 hours No intake or  output data in the 24 hours ending 03/22/24 0956  Labs/Imaging Results for orders placed or performed during the hospital encounter of 03/22/24 (from the past 48 hours)  CBG monitoring, ED     Status: Abnormal   Collection Time: 03/22/24  7:16 AM  Result Value Ref Range   Glucose-Capillary >600 (HH) 70 - 99 mg/dL    Comment: Glucose reference range applies only to samples taken after fasting for at least 8 hours.  Urinalysis, Routine w reflex microscopic -Urine, Clean Catch     Status: Abnormal   Collection  Time: 03/22/24  7:20 AM  Result Value Ref Range   Color, Urine STRAW (A) YELLOW   APPearance CLEAR CLEAR   Specific Gravity, Urine 1.027 1.005 - 1.030   pH 5.0 5.0 - 8.0   Glucose, UA >=500 (A) NEGATIVE mg/dL   Hgb urine dipstick NEGATIVE NEGATIVE   Bilirubin Urine NEGATIVE NEGATIVE   Ketones, ur NEGATIVE NEGATIVE mg/dL   Protein, ur NEGATIVE NEGATIVE mg/dL   Nitrite NEGATIVE NEGATIVE   Leukocytes,Ua NEGATIVE NEGATIVE   RBC / HPF 0-5 0 - 5 RBC/hpf   WBC, UA 0-5 0 - 5 WBC/hpf   Bacteria, UA RARE (A) NONE SEEN   Squamous Epithelial / HPF 0-5 0 - 5 /HPF    Comment: Performed at [facility], [address], [place], [place] [address]  CBC     Status: Abnormal   Collection Time: 03/22/24  7:27 AM  Result Value Ref Range   WBC 8.5 4.0 - 10.5 K/uL   RBC 5.99 (H) 3.87 - 5.11 MIL/uL   Hemoglobin 16.7 (H) 12.0 - 15.0 g/dL   HCT 50.5 (H) 63.9 - 53.9 %   MCV 82.5 80.0 - 100.0 fL   MCH 27.9 26.0 - 34.0 pg   MCHC 33.8 30.0 - 36.0 g/dL   RDW 87.3 88.4 - 84.4 %   Platelets 297 150 - 400 K/uL   nRBC 0.0 0.0 - 0.2 %    Comment: Performed at [facility], [address], [place], [place] [address]  Comprehensive metabolic panel     Status: Abnormal   Collection Time: 03/22/24  7:27 AM  Result Value Ref Range   Sodium 123 (L) 135 - 145 mmol/L   Potassium 3.9 3.5 - 5.1 mmol/L   Chloride 85 (L) 98 - 111 mmol/L   CO2 25 22 - 32 mmol/L   Glucose, Bld 952 (HH) 70 - 99 mg/dL    Comment: CRITICAL RESULT CALLED TO, READ BACK BY AND VERIFIED WITH J. Aliza Moret RN 03/22/24 @0851  BY J.WHITE Glucose reference range applies only to samples taken after fasting for at least 8 hours.    BUN 22 (H) 6 - 20 mg/dL   Creatinine, Ser 8.75 (H) 0.44 - 1.00 mg/dL   Calcium  9.9 8.9 - 10.3 mg/dL   Total Protein 8.0 6.5 - 8.1 g/dL   Albumin 4.4 3.5 - 5.0 g/dL   AST 60 (H) 15 - 41 U/L   ALT 68 (H) 0 - 44 U/L   Alkaline Phosphatase 124 38 - 126 U/L   Total Bilirubin 0.6 0.0 - 1.2 mg/dL   GFR, Estimated 52 (L) >60 mL/min     Comment: (NOTE) Calculated using the CKD-EPI Creatinine Equation (2021)    Anion gap 13 5 - 15    Comment: Performed at [facility], [address], [place], [place] [address]  Beta-hydroxybutyric acid     Status: Abnormal   Collection Time: 03/22/24  7:27 AM  Result Value Ref Range   Beta-Hydroxybutyric Acid 0.31 (H) 0.05 - 0.27 mmol/L    Comment: Performed at [facility], [address], [place], [place] [address]  I-stat chem 8, ed     Status: Abnormal   Collection Time: 03/22/24  7:27 AM  Result Value Ref Range   Sodium 127 (L) 135 - 145 mmol/L   Potassium 4.2 3.5 - 5.1 mmol/L   Chloride 88 (L) 98 - 111 mmol/L   BUN 24 (H) 6 - 20 mg/dL   Creatinine, Ser 8.89 (H) 0.44 - 1.00 mg/dL   Glucose, Bld >299 (HH) 70 - 99 mg/dL    Comment: Glucose reference range applies only to samples taken after fasting for at least 8 hours.   Calcium , Ion 1.19 1.15 - 1.40 mmol/L   TCO2 26 22 - 32 mmol/L   Hemoglobin 17.7 (H) 12.0 - 15.0 g/dL   HCT 47.9 (H) 63.9 - 53.9 %   Comment NOTIFIED PHYSICIAN   Blood gas, venous (at [facility])     Status: Abnormal   Collection Time: 03/22/24  7:53 AM  Result Value Ref Range   pH, Ven 7.39 7.25 - 7.43   pCO2, Ven 44 44 - 60 mmHg   pO2, Ven 47 (H) 32 - 45 mmHg   Bicarbonate 26.6 20.0 - 28.0 mmol/L   Acid-Base Excess 1.0 0.0 - 2.0 mmol/L   O2 Saturation 85.4 %   Patient temperature 36.5    Collection site BLOOD RIGHT HAND    Drawn by [id number]     Comment: Performed at [facility], [address], [place], [place] [address]  CBG monitoring, ED     Status: Abnormal   Collection Time: 03/22/24  8:39 AM  Result Value Ref Range   Glucose-Capillary >600 (HH) 70 - 99 mg/dL    Comment: Glucose reference range applies only to samples taken after fasting for at least 8 hours.  CBG monitoring, ED     Status: Abnormal   Collection Time: 03/22/24  9:15 AM  Result Value Ref Range   Glucose-Capillary >600 (HH) 70 - 99 mg/dL    Comment: Glucose reference range applies  only to samples taken after fasting for at least 8 hours.  CBG monitoring, ED     Status: Abnormal   Collection Time: 03/22/24  9:50 AM  Result Value Ref Range   Glucose-Capillary >600 (HH) 70 - 99 mg/dL    Comment: Glucose reference range applies only to samples taken after fasting for at least 8 hours.   No results found.  Pending Labs Unresulted Labs (From admission, onward)     Start     Ordered   03/22/24 1200  Basic metabolic panel  (Diabetes Ketoacidosis (DKA))  STAT Now then every 4 hours ,   STAT      03/22/24 0812   03/22/24 1200  Beta-hydroxybutyric acid  (Diabetes Ketoacidosis (DKA))  Now then every 4 hours,   STAT (with URGENT occurrences)      03/22/24 0812   03/22/24 0727  Osmolality  Once,   URGENT        03/22/24 0726            Vitals/Pain Today's Vitals   03/22/24 0845 03/22/24 0848 03/22/24 0900 03/22/24 0945  BP: 138/74  (!) 148/78   Pulse: 75  60 65  Resp:    (!) 22  Temp:      TempSrc:      SpO2: 96%  93% 97%  Weight:      Height:      PainSc:  4       Isolation Precautions No active isolations  Medications Medications  insulin  regular, human (MYXREDLIN ) 100 units/ 100 mL infusion (10.5 Units/hr Intravenous New Bag/Given 03/22/24 0843)  lactated ringers  infusion (0 mLs Intravenous Hold 03/22/24 0828)  dextrose  5 % in lactated ringers  infusion (0 mLs Intravenous Hold 03/22/24 0829)  dextrose  50 % solution 0-50 mL (has no administration in time range)  potassium chloride  10 mEq in 100 mL IVPB (10 mEq Intravenous New Bag/Given 03/22/24 0953)  lactated ringers  bolus 1,000 mL (0 mLs Intravenous Stopped 03/22/24 0845)  lactated ringers  bolus 1,000 mL (1,000 mLs Intravenous Bolus 03/22/24 0837)    Mobility walks     Focused Assessments Cardiac Assessment Handoff:  Cardiac Rhythm: Normal sinus rhythm No results found for: CKTOTAL, CKMB, CKMBINDEX, TROPONINI No results found for: DDIMER Does the Patient currently have chest pain? No     R Recommendations: See Admitting Provider Note  Report given to:   Additional Notes:

## 2024-03-22 NOTE — Progress Notes (Signed)
 Date and time results received: 03/22/24 1239 (use smartphrase .now to insert current time)  Test: Serum Osmo  Critical Value: 327  Name of Provider Notified: Dr Evonnie and Heron RN (patient nurse)  Orders Received? Or Actions Taken?: No new  orders currently

## 2024-03-22 NOTE — ED Provider Notes (Signed)
 [place] EMERGENCY DEPARTMENT AT [facility] Provider Note  CSN: [id number] Arrival date & time: 03/22/24 9295  Chief Complaint(s) Hyperglycemia  HPI Suzanne Lawrence is a 53 y.o. female with past medical history as below, significant for GERD, DM, anxiety, OSA on CPAP, tobacco use who presents to the ED with complaint of hyperglycemia  Patient reports she recently started a new job, and makes it difficult for her to take her single insulin .  Her glucose has been very high over the past couple days.  Polydipsia and polyuria.  She has been feeling tired.  No significant abdominal pain, no nausea or vomiting, she has somewhat reduced appetite.  No fevers or chills, no chest pain or dyspnea  Past Medical History Past Medical History:  Diagnosis Date   Abnormal Pap smear of cervix    Anemia    issue with red blood cells being small   Anxiety    Arthritis    Carpal tunnel syndrome    right   Depression    Diabetes mellitus without complication (HCC)    DVT (deep venous thrombosis) (HCC) 03/2021   left arm   GERD (gastroesophageal reflux disease)    High blood pressure    History of hiatal hernia    Left breast mass    Pre-diabetes    Patient Active Problem List   Diagnosis Date Noted   Hyperosmolar hyperglycemic state (HHS) (HCC) 03/22/2024   OSA on CPAP 02/11/2024   Palpitations 03/25/2023   Nicotine abuse 03/25/2023   Non-cardiac chest pain 03/25/2023   DOE (dyspnea on exertion) 03/25/2023   Acute lateral meniscus tear of right knee 11/24/2022   Arthritis of left acromioclavicular joint    Complete tear of left rotator cuff    Abdominal pain, epigastric 01/23/2022   Iron deficiency anemia 12/13/2021   Iron deficiency anemia due to chronic blood loss 12/13/2021   GERD (gastroesophageal reflux disease)    Cubital tunnel syndrome of both upper extremities 01/25/2020   High blood pressure    Depression    Carpal tunnel syndrome    Anxiety    Home  Medication(s) Prior to Admission medications   Medication Sig Start Date End Date Taking? Authorizing Provider  amoxicillin (AMOXIL) 500 MG capsule Take 500 mg by mouth 3 (three) times daily. 12/25/23  Yes [provider]  atorvastatin  (LIPITOR) 10 MG tablet Take 10 mg by mouth daily. 01/14/24  Yes [provider]  CYMBALTA  30 MG capsule Take 30 mg by mouth daily. 03/19/24  Yes [provider]  doxycycline (VIBRAMYCIN) 100 MG capsule Take 100 mg by mouth 2 (two) times daily. 03/17/24  Yes [provider]  fluconazole (DIFLUCAN) 100 MG tablet Take 100 mg by mouth daily. 03/21/24  Yes [provider]  HYDROcodone-acetaminophen  (NORCO/VICODIN) 5-325 MG tablet Take 1 tablet by mouth every 4 (four) hours as needed. 12/25/23  Yes [provider]  traZODone  (DESYREL ) 100 MG tablet Take 100 mg by mouth at bedtime. 03/19/24  Yes [provider]  ACCU-CHEK GUIDE test strip Use 1 each to check blood glucose four times daily 06/25/23   Therisa Benton PARAS, NP  Accu-Chek Softclix Lancets lancets 1 each by Other route 4 (four) times daily. 06/25/23   Therisa Benton PARAS, NP  acetaminophen  (TYLENOL ) 325 MG tablet Take 650 mg by mouth every 6 (six) hours as needed for moderate pain.    [provider]  ARIPiprazole  (ABILIFY ) 10 MG tablet Take 10 mg by mouth daily.  [provider]  atenolol  (TENORMIN ) 50 MG tablet Take 50 mg by mouth daily.    [provider]  baclofen  (LIORESAL ) 10 MG tablet Take 10 mg by mouth 3 (three) times daily as needed (Back pain).    [provider]  Blood Glucose Monitoring Suppl (ACCU-CHEK GUIDE ME) w/Device KIT See admin instructions. 08/14/22   [provider]  chlorthalidone (HYGROTON) 25 MG tablet Take 25 mg by mouth every morning. 10/31/19   [provider]  Continuous Glucose Sensor (DEXCOM G7 SENSOR) MISC Use to continuously monitor glucose. Change sensor every 10 days.  10/01/23   Therisa Benton PARAS, NP  DULoxetine  (CYMBALTA ) 60 MG capsule Take 60 mg by mouth daily in the afternoon. 02/15/21   [provider]  ferrous sulfate  325 (65 FE) MG EC tablet Take 1 tablet (325 mg total) by mouth daily with breakfast. 09/24/23   Lamon Herter M, PA-C  furosemide  (LASIX ) 20 MG tablet Take 1 tablet (20 mg total) by mouth daily as needed. 04/24/23   Mallipeddi, Vishnu P, MD  gabapentin  (NEURONTIN ) 300 MG capsule Take 300 mg by mouth at bedtime. 03/26/21   [provider]  insulin  glargine (LANTUS  SOLOSTAR) 100 UNIT/ML Solostar Pen Inject 30 Units into the skin at bedtime. 10/01/23   Therisa Benton PARAS, NP  lisinopril  (ZESTRIL ) 40 MG tablet Take 40 mg by mouth daily. 08/26/22   [provider]  meclizine (ANTIVERT) 25 MG tablet Take 25 mg by mouth 3 (three) times daily as needed for dizziness.    [provider]  OZEMPIC , 0.25 OR 0.5 MG/DOSE, 2 MG/3ML SOPN SMARTSIG:0.25 Milligram(s) SUB-Q Once a Week    [provider]  pantoprazole  (PROTONIX ) 40 MG tablet Take 1 tablet (40 mg total) by mouth 2 (two) times daily before a meal. 11/13/23   Mahon, Charmaine CROME, NP  spironolactone  (ALDACTONE ) 25 MG tablet Take 12.5 mg by mouth daily. 01/30/23   [provider]  traZODone  (DESYREL ) 50 MG tablet Take 50-100 mg by mouth at bedtime. 10/14/22   [provider]                                                                                                                                    Past Surgical History Past Surgical History:  Procedure Laterality Date   BIOPSY  02/05/2022   Procedure: BIOPSY;  Surgeon: Cindie Carlin POUR, DO;  Location: [facility];  Service: Endoscopy;;   COLONOSCOPY  2022   Megan Straughan: 7 polyps removed, has to return in one year   COLONOSCOPY  09/2021   Duwaine Jumper: 12 polyps removed   ESOPHAGOGASTRODUODENOSCOPY (EGD) WITH PROPOFOL  N/A 02/05/2022   Procedure: ESOPHAGOGASTRODUODENOSCOPY (EGD)  WITH PROPOFOL ;  Surgeon: Cindie Carlin POUR, DO;  Location: [facility];  Service: Endoscopy;  Laterality: N/A;  2:00pm   KNEE ARTHROSCOPY Right 10/28/2022   Procedure: RIGHT KNEE ARTHROSCOPY, DEBRIDEMENT;  Surgeon: Addie Hacker  Glendia, MD;  Location: [facility];  Service: Orthopedics;  Laterality: Right;   left shoulder arthroscopy  12/2020   SHOULDER ARTHROSCOPY WITH OPEN ROTATOR CUFF REPAIR AND DISTAL CLAVICLE ACROMINECTOMY Left 03/21/2022   Procedure: LEFT SHOULDER ARTHOSCOPY, DEBRIDEMENT,  MANIPULATION, ROTATOR CUFF REPAIR, ARTHROSCOPIC DISTAL CLAVICLE EXCISION;  Surgeon: Addie Cordella Glendia, MD;  Location: [facility] OR;  Service: Orthopedics;  Laterality: Left;   TUBAL LIGATION     Family History Family History  Problem Relation Age of Onset   Lung cancer Mother    Other Father        multiple issues, doesn't know history for sure   Prostate cancer Father    Stroke Father    Aneurysm Paternal Grandmother    Breast cancer Maternal Aunt    Prostate cancer Maternal Uncle    Alcoholism Maternal Uncle    Aneurysm Cousin        paternal side   Aneurysm Cousin        paternal side    Breast cancer Cousin        maternal side    High blood pressure Other        father's side    Cerebral aneurysm Other        cerebral, on father's side    Alcoholism Other        maternal side    Colon cancer Neg Hx     Social History Social History   Tobacco Use   Smoking status: Every Day    Current packs/day: 0.50    Types: Cigarettes   Smokeless tobacco: Never   Tobacco comments:    Working on cutting down on smoking. Down to 8 cigarrettes a day now.  Vaping Use   Vaping status: Never Used  Substance Use Topics   Alcohol use: Not Currently   Drug use: Not Currently   Allergies Ferumoxytol  and Hydrochlorothiazide  Review of Systems A thorough review of systems was obtained and all systems are negative except as noted in the HPI and PMH.   Physical Exam Vital Signs  I  have reviewed the triage vital signs BP (!) 148/78   Pulse 60   Temp 98.8 F (37.1 C) (Oral)   Resp 18   Ht 5' 1 (1.549 m)   Wt 90.7 kg   LMP 05/01/2021 (Approximate)   SpO2 93%   BMI 37.79 kg/m  Physical Exam Vitals and nursing note reviewed.  Constitutional:      General: She is not in acute distress.    Appearance: Normal appearance. She is well-developed. She is obese. She is not ill-appearing.  HENT:     Head: Normocephalic and atraumatic.     Right Ear: External ear normal.     Left Ear: External ear normal.     Nose: Nose normal.     Mouth/Throat:     Mouth: Mucous membranes are moist.  Eyes:     General: No scleral icterus.       Right eye: No discharge.        Left eye: No discharge.  Cardiovascular:     Rate and Rhythm: Normal rate.  Pulmonary:     Effort: Pulmonary effort is normal. No respiratory distress.     Breath sounds: No stridor.  Abdominal:     General: Abdomen is flat. There is no distension.     Tenderness: There is no guarding.  Musculoskeletal:        General: No deformity.  Cervical back: No rigidity.  Skin:    General: Skin is warm and dry.     Coloration: Skin is not cyanotic, jaundiced or pale.  Neurological:     Mental Status: She is alert and oriented to person, place, and time.     GCS: GCS eye subscore is 4. GCS verbal subscore is 5. GCS motor subscore is 6.  Psychiatric:        Speech: Speech normal.        Behavior: Behavior normal. Behavior is cooperative.     ED Results and Treatments Labs (all labs ordered are listed, but only abnormal results are displayed) Labs Reviewed  CBC - Abnormal; Notable for the following components:      Result Value   RBC 5.99 (*)    Hemoglobin 16.7 (*)    HCT 49.4 (*)    All other components within normal limits  URINALYSIS, ROUTINE W REFLEX MICROSCOPIC - Abnormal; Notable for the following components:   Color, Urine STRAW (*)    Glucose, UA >=500 (*)    Bacteria, UA RARE (*)    All  other components within normal limits  COMPREHENSIVE METABOLIC PANEL WITH GFR - Abnormal; Notable for the following components:   Sodium 123 (*)    Chloride 85 (*)    Glucose, Bld 952 (*)    BUN 22 (*)    Creatinine, Ser 1.24 (*)    AST 60 (*)    ALT 68 (*)    GFR, Estimated 52 (*)    All other components within normal limits  BETA-HYDROXYBUTYRIC ACID - Abnormal; Notable for the following components:   Beta-Hydroxybutyric Acid 0.31 (*)    All other components within normal limits  BLOOD GAS, VENOUS - Abnormal; Notable for the following components:   pO2, Ven 47 (*)    All other components within normal limits  CBG MONITORING, ED - Abnormal; Notable for the following components:   Glucose-Capillary >600 (*)    All other components within normal limits  CBG MONITORING, ED - Abnormal; Notable for the following components:   Glucose-Capillary >600 (*)    All other components within normal limits  I-STAT CHEM 8, ED - Abnormal; Notable for the following components:   Sodium 127 (*)    Chloride 88 (*)    BUN 24 (*)    Creatinine, Ser 1.10 (*)    Glucose, Bld >700 (*)    Hemoglobin 17.7 (*)    HCT 52.0 (*)    All other components within normal limits  CBG MONITORING, ED - Abnormal; Notable for the following components:   Glucose-Capillary >600 (*)    All other components within normal limits  OSMOLALITY  BASIC METABOLIC PANEL WITH GFR  BASIC METABOLIC PANEL WITH GFR  BASIC METABOLIC PANEL WITH GFR  BETA-HYDROXYBUTYRIC ACID  BETA-HYDROXYBUTYRIC ACID  BETA-HYDROXYBUTYRIC ACID  Radiology No results found.  Pertinent labs & imaging results that were available during my care of the patient were reviewed by me and considered in my medical decision making (see MDM for details).  Medications Ordered in ED Medications  insulin  regular, human (MYXREDLIN ) 100 units/  100 mL infusion (10.5 Units/hr Intravenous New Bag/Given 03/22/24 0843)  lactated ringers  infusion (0 mLs Intravenous Hold 03/22/24 0828)  dextrose  5 % in lactated ringers  infusion (0 mLs Intravenous Hold 03/22/24 0829)  dextrose  50 % solution 0-50 mL (has no administration in time range)  potassium chloride  10 mEq in 100 mL IVPB (10 mEq Intravenous New Bag/Given 03/22/24 0839)  lactated ringers  bolus 1,000 mL (1,000 mLs Intravenous Bolus 03/22/24 0736)  lactated ringers  bolus 1,000 mL (1,000 mLs Intravenous Bolus 03/22/24 0837)                                                                                                                                     Procedures .Critical Care  Performed by: Elnor Jayson LABOR, DO Authorized by: Elnor Jayson LABOR, DO   Critical care provider statement:    Critical care time (minutes):  30   Critical care time was exclusive of:  Separately billable procedures and treating other patients   Critical care was necessary to treat or prevent imminent or life-threatening deterioration of the following conditions:  Endocrine crisis   Critical care was time spent personally by me on the following activities:  Development of treatment plan with patient or surrogate, discussions with consultants, evaluation of patient's response to treatment, examination of patient, ordering and review of laboratory studies, ordering and review of radiographic studies, ordering and performing treatments and interventions, pulse oximetry, re-evaluation of patient's condition, review of old charts and obtaining history from patient or surrogate   Care discussed with: admitting provider     (including critical care time)  Medical Decision Making / ED Course    Medical Decision Making:    Mirai Greenwood is a 53 y.o. female with past medical history as below, significant for GERD, DM, anxiety, OSA on CPAP, tobacco use who presents to the ED with complaint of hyperglycemia. The complaint  involves an extensive differential diagnosis and also carries with it a high risk of complications and morbidity.  Serious etiology was considered. Ddx includes but is not limited to: Hypoglycemia, DKA, HHS, electrolyte derangement, hypokalemia, dehydration, etc.  Complete initial physical exam performed, notably the patient was in no acute distress, sitting comfortably.    Reviewed and confirmed nursing documentation for past medical history, family history, social history.  Vital signs reviewed.    Hyperglycemic crisis/ HHS Dehydration/aki > - Poorly controlled IDDM - Glucose over 900, no ketones, no anion gap; doubt DKA or HHS - Creatinine elevated from baseline, she is hemoconcentrated, she appears dry on exam concern for dehydration, AKI.  Given 2 L LR - Hyperglycemia with Endo tool IV fluid resuscitation, also  replace potassium - recommend obs for hyperglycemia, HHS  Clinical Course as of 03/22/24 0950  Mon Mar 22, 2024  0902 Creatinine(!): 1.24 [SG]  0902 Hemoglobin(!): 16.7 [SG]  0907 Sodium(!): 123 Corrected 143 [SG]    Clinical Course User Index [SG] Elnor Jayson LABOR, DO        Admit [facility] Dr Tat            Additional history obtained: -Additional history obtained from na -External records from outside source obtained and reviewed including: Chart review including previous notes, labs, imaging, consultation notes including  Home meds Prior labs   Lab Tests: -I ordered, reviewed, and interpreted labs.   The pertinent results include:   Labs Reviewed  CBC - Abnormal; Notable for the following components:      Result Value   RBC 5.99 (*)    Hemoglobin 16.7 (*)    HCT 49.4 (*)    All other components within normal limits  URINALYSIS, ROUTINE W REFLEX MICROSCOPIC - Abnormal; Notable for the following components:   Color, Urine STRAW (*)    Glucose, UA >=500 (*)    Bacteria, UA RARE (*)    All other components within normal limits  COMPREHENSIVE  METABOLIC PANEL WITH GFR - Abnormal; Notable for the following components:   Sodium 123 (*)    Chloride 85 (*)    Glucose, Bld 952 (*)    BUN 22 (*)    Creatinine, Ser 1.24 (*)    AST 60 (*)    ALT 68 (*)    GFR, Estimated 52 (*)    All other components within normal limits  BETA-HYDROXYBUTYRIC ACID - Abnormal; Notable for the following components:   Beta-Hydroxybutyric Acid 0.31 (*)    All other components within normal limits  BLOOD GAS, VENOUS - Abnormal; Notable for the following components:   pO2, Ven 47 (*)    All other components within normal limits  CBG MONITORING, ED - Abnormal; Notable for the following components:   Glucose-Capillary >600 (*)    All other components within normal limits  CBG MONITORING, ED - Abnormal; Notable for the following components:   Glucose-Capillary >600 (*)    All other components within normal limits  I-STAT CHEM 8, ED - Abnormal; Notable for the following components:   Sodium 127 (*)    Chloride 88 (*)    BUN 24 (*)    Creatinine, Ser 1.10 (*)    Glucose, Bld >700 (*)    Hemoglobin 17.7 (*)    HCT 52.0 (*)    All other components within normal limits  CBG MONITORING, ED - Abnormal; Notable for the following components:   Glucose-Capillary >600 (*)    All other components within normal limits  OSMOLALITY  BASIC METABOLIC PANEL WITH GFR  BASIC METABOLIC PANEL WITH GFR  BASIC METABOLIC PANEL WITH GFR  BETA-HYDROXYBUTYRIC ACID  BETA-HYDROXYBUTYRIC ACID  BETA-HYDROXYBUTYRIC ACID    Notable for as above  EKG   EKG Interpretation Date/Time:    Ventricular Rate:    PR Interval:    QRS Duration:    QT Interval:    QTC Calculation:   R Axis:      Text Interpretation:           Imaging Studies ordered: na   Medicines ordered and prescription drug management: Meds ordered this encounter  Medications   lactated ringers  bolus 1,000 mL   lactated ringers  bolus 1,000 mL   insulin  regular, human (MYXREDLIN ) 100 units/ 100  mL infusion    EndoTool Goal Range::   140-180    Type of Diabetes:   Type 1    Mode of Therapy:   ENDOX1 for DKA    Start Method:   EndoTool to calculate   lactated ringers  infusion   dextrose  5 % in lactated ringers  infusion   dextrose  50 % solution 0-50 mL   potassium chloride  10 mEq in 100 mL IVPB    -I have reviewed the patients home medicines and have made adjustments as needed   Consultations Obtained: I requested consultation with the hospitalist,  and discussed lab and imaging findings as well as pertinent plan  Cardiac Monitoring: Continuous pulse oximetry interpreted by myself, 97% on ra.    Social Determinants of Health:  Diagnosis or treatment significantly limited by social determinants of health: current smoker and obesity Counseled patient for approximately 3 minutes regarding smoking cessation. Discussed risks of smoking and how they applied and affected their visit here today. Patient not ready to quit at this time, however will follow up with their primary doctor when they are.   CPT code: 00593: intermediate counseling for smoking cessation     Reevaluation: After the interventions noted above, I reevaluated the patient and found that they have improved  Co morbidities that complicate the patient evaluation  Past Medical History:  Diagnosis Date   Abnormal Pap smear of cervix    Anemia    issue with red blood cells being small   Anxiety    Arthritis    Carpal tunnel syndrome    right   Depression    Diabetes mellitus without complication (HCC)    DVT (deep venous thrombosis) (HCC) 03/2021   left arm   GERD (gastroesophageal reflux disease)    High blood pressure    History of hiatal hernia    Left breast mass    Pre-diabetes       Dispostion: Disposition decision including need for hospitalization was considered, and patient admitted to the hospital.    Final Clinical Impression(s) / ED Diagnoses Final diagnoses:  Hyperglycemia  AKI  (acute kidney injury) (HCC)  Dehydration        Elnor Jayson LABOR, DO 03/22/24 [phone]

## 2024-03-23 ENCOUNTER — Encounter (HOSPITAL_COMMUNITY): Payer: Self-pay | Admitting: Hematology

## 2024-03-23 ENCOUNTER — Telehealth (HOSPITAL_COMMUNITY): Payer: Self-pay | Admitting: Pharmacy Technician

## 2024-03-23 ENCOUNTER — Inpatient Hospital Stay: Admitting: Oncology

## 2024-03-23 ENCOUNTER — Other Ambulatory Visit (HOSPITAL_COMMUNITY): Payer: Self-pay

## 2024-03-23 DIAGNOSIS — E1100 Type 2 diabetes mellitus with hyperosmolarity without nonketotic hyperglycemic-hyperosmolar coma (NKHHC): Secondary | ICD-10-CM | POA: Diagnosis not present

## 2024-03-23 DIAGNOSIS — Z794 Long term (current) use of insulin: Secondary | ICD-10-CM | POA: Diagnosis not present

## 2024-03-23 DIAGNOSIS — N179 Acute kidney failure, unspecified: Secondary | ICD-10-CM | POA: Diagnosis not present

## 2024-03-23 DIAGNOSIS — E1165 Type 2 diabetes mellitus with hyperglycemia: Secondary | ICD-10-CM | POA: Diagnosis not present

## 2024-03-23 LAB — BASIC METABOLIC PANEL WITH GFR
Anion gap: 8 (ref 5–15)
BUN: 11 mg/dL (ref 6–20)
CO2: 25 mmol/L (ref 22–32)
Calcium: 8.8 mg/dL — ABNORMAL LOW (ref 8.9–10.3)
Chloride: 99 mmol/L (ref 98–111)
Creatinine, Ser: 0.78 mg/dL (ref 0.44–1.00)
GFR, Estimated: >60 mL/min (ref >60)
Glucose, Bld: 306 mg/dL — ABNORMAL HIGH (ref 70–99)
Potassium: 3.4 mmol/L — ABNORMAL LOW (ref 3.5–5.1)
Sodium: 132 mmol/L — ABNORMAL LOW (ref 135–145)

## 2024-03-23 LAB — GLUCOSE, CAPILLARY
Glucose-Capillary: 314 mg/dL — ABNORMAL HIGH (ref 70–99)
Glucose-Capillary: 338 mg/dL — ABNORMAL HIGH (ref 70–99)
Glucose-Capillary: 341 mg/dL — ABNORMAL HIGH (ref 70–99)
Glucose-Capillary: 381 mg/dL — ABNORMAL HIGH (ref 70–99)

## 2024-03-23 LAB — MAGNESIUM: Magnesium: 1.7 mg/dL (ref 1.7–2.4)

## 2024-03-23 MED ORDER — MAGNESIUM OXIDE -MG SUPPLEMENT 400 (240 MG) MG PO TABS
400.0000 mg | ORAL_TABLET | Freq: Every day | ORAL | 0 refills | Status: DC
Start: 2024-03-23 — End: 2024-04-16

## 2024-03-23 MED ORDER — MAGNESIUM OXIDE -MG SUPPLEMENT 400 (240 MG) MG PO TABS: 400.0000 mg | ORAL_TABLET | Freq: Every day | ORAL | Status: DC

## 2024-03-23 MED ORDER — INSULIN ASPART 100 UNIT/ML IJ SOLN
5.0000 [IU] | Freq: Three times a day (TID) | INTRAMUSCULAR | Status: DC
  Administered 2024-03-23: 5 [IU] via SUBCUTANEOUS

## 2024-03-23 MED ORDER — MAGNESIUM SULFATE 2 GM/50ML IV SOLN
2.0000 g | Freq: Once | INTRAVENOUS | Status: AC
  Administered 2024-03-23: 2 g via INTRAVENOUS
  Filled 2024-03-23: qty 50

## 2024-03-23 MED ORDER — INSULIN ASPART 100 UNIT/ML IJ SOLN: 5.0000 [IU] | Freq: Three times a day (TID) | INTRAMUSCULAR | Status: DC

## 2024-03-23 MED ORDER — INSULIN GLARGINE-YFGN 100 UNIT/ML ~~LOC~~ SOLN
35.0000 [IU] | Freq: Every day | SUBCUTANEOUS | Status: DC
  Filled 2024-03-23: qty 0.35

## 2024-03-23 MED ORDER — LISINOPRIL 5 MG PO TABS: 5.0000 mg | ORAL_TABLET | Freq: Every day | ORAL | 2 refills | Status: AC

## 2024-03-23 MED ORDER — ATENOLOL 25 MG PO TABS: 25.0000 mg | ORAL_TABLET | Freq: Every day | ORAL | 2 refills | Status: AC

## 2024-03-23 MED ORDER — LANTUS SOLOSTAR 100 UNIT/ML ~~LOC~~ SOPN: 35.0000 [IU] | PEN_INJECTOR | Freq: Every day | SUBCUTANEOUS | Status: DC

## 2024-03-23 MED ORDER — POTASSIUM CHLORIDE CRYS ER 20 MEQ PO TBCR
40.0000 meq | EXTENDED_RELEASE_TABLET | Freq: Once | ORAL | Status: AC
  Administered 2024-03-23: 40 meq via ORAL
  Filled 2024-03-23: qty 2

## 2024-03-23 NOTE — Inpatient Diabetes Management (Addendum)
 Inpatient Diabetes Program Recommendations  AACE/ADA: New Consensus Statement on Inpatient Glycemic Control   Target Ranges:  Prepandial:   less than 140 mg/dL      Peak postprandial:   less than 180 mg/dL (1-2 hours)      Critically ill patients:  140 - 180 mg/dL    Latest Reference Range & Units 03/23/24 00:06 03/23/24 04:56 03/23/24 07:36  Glucose-Capillary 70 - 99 mg/dL 685 (H) 661 (H) 658 (H)    Latest Reference Range & Units 03/22/24 15:53 03/22/24 16:56 03/22/24 17:56 03/22/24 18:57 03/22/24 20:05 03/22/24 21:14  Glucose-Capillary 70 - 99 mg/dL 813 (H) 797 (H) 754 (H) 221 (H) 308 (H) 330 (H)    Latest Reference Range & Units 03/22/24 07:27 03/22/24 11:57 03/22/24 16:15 03/23/24 02:58  Beta-Hydroxybutyric Acid 0.05 - 0.27 mmol/L 0.31 (H) 0.18 0.15   Glucose 70 - 99 mg/dL 047 (HH) >299 (HH) 652 (H) 199 (H) 306 (H)  Hemoglobin A1C 4.8 - 5.6 %  13.1 (H)     Review of Glycemic Control  Diabetes history: DM2 Outpatient Diabetes medications: Lantus  30 units at bedtime, Ozempic  0.50 mg Qweek Current orders for Inpatient glycemic control: Semglee  20 units daily, Novolog  0-9 units TID with meals, Novolog  0-5 units at bedtime, Novolog  3 units TID with meals   Inpatient Diabetes Program Recommendations:     Insulin : Patient received Semglee  20 units at 18:59 on 7/28 and CBG 341 mg/dl this morning.  Please consider increasing Semglee  to 35 units daily and meal coverage to Novolog  5 units TID with meals.   Outpatient DM: Based on glucose trends as inpatient, would recommend patient resume Humalog  5 units TID with meals outpatient. Please provide Rx for Humalog  [employer] [phone]).  Patient would like to take Lantus  each evening before or shortly after going to work to help it fit into her daily routine so she can remember to take it consistently.    NOTE: Patient with DM2 hx admitted with HHS, lab glucose 952 mg/dl on 2/71/74. 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  Patient sees MICAEL Rings, NP and was last seen on 10/01/23. Per office note on 10/01/23, patient was advised to decrease Lantus  to 30 units at bedtime and stop meal time insulin , and continue Ozempic  0.5 mg Qweek. Also noted patient seen M. Crumpton, RD on 02/05/24 at [facility]. Will plan to talk with patient today.  Addendum 03/23/24@12 :50-Spoke with patient at bedside regarding DM control. Patient reports that she is taking Lantus  30 units QAM and Ozempic  0.5 mg Qweek (Tuesday) for DM control. Patient states that she forgets to take Lantus  sometimes when she gets off work in the mornings. Patient reports that she works [facility] the [employer] from 6pm to 6 am. Patient reports there is a refrigerator at work where she can store insulin .   Discussed that she will need to take Lantus  about the same time each day as it works for about 24 hours. Discussed changing the Lantus  administration timing to whenever it works best for her so she can remember to take it on days she works as well as days off. Encouraged patient to also set an alarm on  her cell phone to help her remember. Patient confirms that she is also using Dexcom G7 CGM sensor and that glucose had been running in 100's and going up in 200's at times. Patient use to take Humalog  insulin  but it  was stopped after starting Ozempic . Patient states she still has some Humalog  insulin  at home. Discussed that based on glucose trends while inpatient, she will likely need to resume taking Humalog  with meals. Patient asked how she will keep the Humalog  with meals on schedule on days she works and days she is off. Asked that she take the Humalog  with meals as long as she is eating at least 25-30 grams of carbs. Discussed if glucose is still running over 250 mg/dl or if she is having any issues with hypoglycemia to reach out to her Endocrinology office for further insulin  adjustments. Patient reports that  she has an appointment with Endocrinology the last week of August. Reviewed hypoglycemia along with treatment; patient has experienced in past and uses orange juice to treat. 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  Informed patient that it will be recommended that she resume Humalog  and that it would be requested that Rx for Humalog  pens be sent to her pharmacy.  Discussed that Endocrinology would be able to see hospital notes and changes made a discharge. Asked patient to stay in contact with Endocrinology is she has any further issues with glycemic control. Patient states she has plenty of Lantus , Ozmepic, and Dexcom G7 sensors at home. Patient verbalized understanding of information discussed and she has no questions at this time.   Thanks, Earnie Gainer, RN, MSN, CDCES Diabetes Coordinator Inpatient Diabetes Program [phone] (Team Pager from 8am to 5pm)

## 2024-03-23 NOTE — Care Management Obs Status (Signed)
 MEDICARE OBSERVATION STATUS NOTIFICATION   Patient Details  Name: Suzanne Lawrence MRN: [id number] Date of Birth: 1971/07/17   Medicare Observation Status Notification Given:  Yes    [name] 03/23/2024, 1:35 PM

## 2024-03-23 NOTE — Telephone Encounter (Signed)
 Patient Product/process development scientist completed.    The patient is insured through [facility]. Patient has Medicare and is not eligible for a copay card, but may be able to apply for patient assistance or Medicare RX Payment Plan (Patient Must reach out to their plan, if eligible for payment plan), if available.    Ran test claim for Humalog  Kwikpen and the current 30 day co-pay is $0.00.  Ran test claim for Dexcom G7 Sensor and was filled on 03/19/2024 can be filled again on 04/11/2024  This test claim was processed through [facility]- copay amounts may vary at other pharmacies due to pharmacy/plan contracts, or as the patient moves through the different stages of their insurance plan.     Suzanne Lawrence, CPHT Pharmacy Technician III Certified Patient Advocate [facility] Patient Advocate Team Direct Number: [phone]  Fax: [phone]

## 2024-03-23 NOTE — Plan of Care (Signed)
 Problem: Education: Goal: Knowledge of General Education information will improve Description: Including pain rating scale, medication(s)/side effects and non-pharmacologic comfort measures Outcome: Adequate for Discharge   Problem: Health Behavior/Discharge Planning: Goal: Ability to manage health-related needs will improve Outcome: Adequate for Discharge   Problem: Clinical Measurements: Goal: Ability to maintain clinical measurements within normal limits will improve Outcome: Adequate for Discharge Goal: Will remain free from infection Outcome: Adequate for Discharge Goal: Diagnostic test results will improve Outcome: Adequate for Discharge Goal: Respiratory complications will improve Outcome: Adequate for Discharge Goal: Cardiovascular complication will be avoided Outcome: Adequate for Discharge   Problem: Activity: Goal: Risk for activity intolerance will decrease Outcome: Adequate for Discharge   Problem: Nutrition: Goal: Adequate nutrition will be maintained Outcome: Adequate for Discharge   Problem: Coping: Goal: Level of anxiety will decrease Outcome: Adequate for Discharge   Problem: Elimination: Goal: Will not experience complications related to bowel motility Outcome: Adequate for Discharge Goal: Will not experience complications related to urinary retention Outcome: Adequate for Discharge   Problem: Pain Managment: Goal: General experience of comfort will improve and/or be controlled Outcome: Adequate for Discharge   Problem: Safety: Goal: Ability to remain free from injury will improve Outcome: Adequate for Discharge   Problem: Skin Integrity: Goal: Risk for impaired skin integrity will decrease Outcome: Adequate for Discharge   Problem: Education: Goal: Ability to describe self-care measures that may prevent or decrease complications (Diabetes Survival Skills Education) will improve Outcome: Adequate for Discharge Goal: Individualized Educational  Video(s) Outcome: Adequate for Discharge   Problem: Coping: Goal: Ability to adjust to condition or change in health will improve Outcome: Adequate for Discharge   Problem: Fluid Volume: Goal: Ability to maintain a balanced intake and output will improve Outcome: Adequate for Discharge   Problem: Health Behavior/Discharge Planning: Goal: Ability to identify and utilize available resources and services will improve Outcome: Adequate for Discharge Goal: Ability to manage health-related needs will improve Outcome: Adequate for Discharge   Problem: Metabolic: Goal: Ability to maintain appropriate glucose levels will improve Outcome: Adequate for Discharge   Problem: Nutritional: Goal: Maintenance of adequate nutrition will improve Outcome: Adequate for Discharge Goal: Progress toward achieving an optimal weight will improve Outcome: Adequate for Discharge   Problem: Skin Integrity: Goal: Risk for impaired skin integrity will decrease Outcome: Adequate for Discharge   Problem: Tissue Perfusion: Goal: Adequacy of tissue perfusion will improve Outcome: Adequate for Discharge   Problem: Education: Goal: Ability to describe self-care measures that may prevent or decrease complications (Diabetes Survival Skills Education) will improve Outcome: Adequate for Discharge Goal: Individualized Educational Video(s) Outcome: Adequate for Discharge   Problem: Cardiac: Goal: Ability to maintain an adequate cardiac output will improve Outcome: Adequate for Discharge   Problem: Health Behavior/Discharge Planning: Goal: Ability to identify and utilize available resources and services will improve Outcome: Adequate for Discharge Goal: Ability to manage health-related needs will improve Outcome: Adequate for Discharge   Problem: Fluid Volume: Goal: Ability to achieve a balanced intake and output will improve Outcome: Adequate for Discharge   Problem: Metabolic: Goal: Ability to maintain  appropriate glucose levels will improve Outcome: Adequate for Discharge   Problem: Nutritional: Goal: Maintenance of adequate nutrition will improve Outcome: Adequate for Discharge Goal: Maintenance of adequate weight for body size and type will improve Outcome: Adequate for Discharge   Problem: Respiratory: Goal: Will regain and/or maintain adequate ventilation Outcome: Adequate for Discharge   Problem: Urinary Elimination: Goal: Ability to achieve and maintain adequate renal perfusion and  functioning will improve Outcome: Adequate for Discharge

## 2024-03-23 NOTE — Progress Notes (Signed)
   03/23/24 1014  TOC Brief Assessment  Insurance and Status Reviewed  Patient has primary care physician Yes  Home environment has been reviewed Single family home  Prior level of function: Independent  Prior/Current Home Services No current home services  Social Drivers of Health Review SDOH reviewed no interventions necessary  Readmission risk has been reviewed Yes  Transition of care needs no transition of care needs at this time

## 2024-03-24 ENCOUNTER — Inpatient Hospital Stay: Payer: 59 | Admitting: Physician Assistant

## 2024-03-29 ENCOUNTER — Telehealth: Payer: Self-pay | Admitting: *Deleted

## 2024-03-29 ENCOUNTER — Encounter (HOSPITAL_COMMUNITY): Payer: Self-pay | Admitting: Hematology

## 2024-03-29 NOTE — Telephone Encounter (Signed)
 Patient left a message that her blood sugars are running high for the last few weeks, That she had ben seen in the ED, and that they had gotten her blood sugars done and it was below 300's. They are continuing to run in the 300's, 400's and 500's.  Patient was called. She said that she is still using the Dexcom reader, she plans to bring that by in the morning for a download. She is currently injecting 35 units at 5 pm daily ,as she works the night shift. She is also injecting Ozempic  0.5 mg weekly, Novolog  5 units three times daily. Patient reports that she has been on antibiotics and completed last week. She is rotating her injection sites , and there has been a few times that she has seen some insulin  run out of the injection site..  Patient was last seen on 10/01/2023, was a no show on 02/08/2024, and currently has an appointment in 04/16/24.

## 2024-03-29 NOTE — Progress Notes (Unsigned)
 [facility] [address], [place] [address]   CLINIC:  Medical Oncology/Hematology  PCP:  Trudy Vaughn FALCON, MD [address] [place] [address] [phone]   REASON FOR VISIT: Iron deficiency anemia, alpha thalassemia trait, and erythrocytosis.   PRIOR THERAPY: Iron tablets   CURRENT THERAPY: Intermittent IV iron (last on 12/18/2021 and 12/26/2021)  INTERVAL HISTORY:   Suzanne Lawrence 53 y.o. female returns for routine follow-up of  iron deficiency anemia.   She was last seen by Pleasant Barefoot PA-C on 09/24/2023.   In the interim since last visit, she was hospitalized 03/14/2024 through 03/23/2024 due to hyperosmolar hyperglycemia. No other interim changes in her health since last visit.  At today's visit, she reports feeling somewhat poorly due to multiple comorbidities, particularly her issues with her diabetes.  She reports that her fasting blood sugar this morning was 514, and she is planning to discuss with her endocrinologist after she is done with her visit here.  She has not had any recurrent major menstrual bleeding since uterine ablation in March 2023. No rectal bleeding or melena; she has dark green bowel movements from time to time.   She had ongoing fatigue. She has some chronic dyspnea on exertion from being out of shape.   She reports dizziness/lightheadedness from vertigo.   She denies any headaches, chest pain, palpitations, or syncope.    She was taking iron tablet daily, but cut back to once weekly at the instruction of her PCP about 2 months ago. She continues to smoke about 0.5 PPD.   Sleep study from 11/18/2023 showed moderate struct of sleep apnea, and was recommended for CPAP, trying to wear this nightly but struggling to tolerate it.  She has 25% energy and 80% appetite. Her weight is stable.  ASSESSMENT & PLAN:  1.  Iron deficiency anemia: - She previously had menorrhagia, s/p endometrial ablation in March 2023.  No recurrent  vaginal bleeding. - She denies bright red blood per rectum or melena. - Colonoscopy (10/03/2021): Removal of 2 sessile polyps in the descending colon, 2 sessile polyps in the sigmoid colon, 4 sessile polyps in the sigmoid colon, 3 sessile polyps in the rectosigmoid colon.  Pathology report shows all removed polyps to be hyperplastic with no dysplasia or malignancy. - EGD (02/05/2022): Small hiatal hernia, gastritis, gastric polyps x3 - She received IV Feraheme  x2 (12/18/2021 and 12/26/2021).  She had mild INFUSION REACTION to first dose of Feraheme , was able to complete after receiving IV steroids.  Tolerated her second IV Feraheme  infusion well with premedications. - She is taking iron tablet weekly. - Most recent labs (03/17/2024): Hgb 15.6/HCT 46.3%, ferritin 136, iron saturation 15% - PLAN: Iron levels look much better.  Recommend continuing ferrous sulfate  325 mg twice weekly. - CBC/D and iron panel in 1 year.  Consider discharge at that time if overall stable.   2.  Alpha thalassemia trait - Persistent microcytosis and elevated RBC, regardless of hemoglobin or iron levels. - Hemoglobin fractionation cascade was normal (03/15/2022), although this does not exclude alpha thalassemia trait.  Copper  levels were normal. - Alpha thalassemia genotype was POSITIVE for alpha thalassemia trait - PLAN: We discussed diagnosis of alpha thalassemia trait (unaffected carrier state).     3.  Erythrocytosis - Intermittently elevated hemoglobin since July 2023 with Hgb 15.3/HCT 47.4 (03/08/2022) and Hgb 16.0/HCT 47.8 (08/07/2022) - Patient smokes 0.5 PPD cigarettes - Diagnosed with moderate OSA in March 2025, using CPAP since April 2025. -  No obvious carbon monoxide exposure, but she has gas heat at home, along with CO monitor.   - Hematology workup (08/21/2022): Elevated blood carbon monoxide 11.7% (usually <9.9% in smokers) Mildly elevated erythropoietin  23.7 Negative JAK2, CALR, MPL - She may have some  diuretic effect from her chlorthalidone which could be causing hemoconcentration.  Additionally, she has had severe hyperglycemia for the past several months (blood sugars in the 400s), which could also be causing some diuretic effect. 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  Elevated RBC also from alpha thalassemia trait.   - PLAN: No indication for phlebotomy, but would consider this in patients with secondary polycythemia who have hematocrit >54%, or are experiencing severe vasomotor symptoms. -- Continue aspirin  81 mg daily - Continue optimization of CPAP. - Recommend smoking cessation  - RTC 1 year.   4.  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  She had exposure to chemicals including heptane and toluene. - She is current active smoker, 1 pack every 2 to 3 days for 25 years.  (Does not qualify for LCS/LDCT chest due to pack-year history <20) - No family history of severe anemia or sickle cell disease.  Mother had lung cancer.  Father had prostate and colon cancer.  Maternal aunt had breast cancer.  Maternal and paternal cousins had breast cancer.  Maternal uncle had colon cancer.  PLAN SUMMARY: >> Labs in 1 year = CBC/D, ferritin, iron/TIBC >> OFFICE visit in 1 year (1 week after labs)    REVIEW OF SYSTEMS:   Review of Systems  Constitutional:  Positive for fatigue. Negative for appetite change, chills, diaphoresis, fever and unexpected weight change.  HENT:   Negative for lump/mass and nosebleeds.   Eyes:  Negative for eye problems.  Respiratory:  Positive for shortness of breath (with exertion). Negative for cough and hemoptysis.   Cardiovascular:  Negative for chest pain, leg swelling and palpitations.  Gastrointestinal:  Positive for constipation and diarrhea. Negative for abdominal pain, blood in  stool, nausea and vomiting.  Genitourinary:  Negative for hematuria.   Musculoskeletal:  Positive for back pain.  Skin: Negative.   Neurological:  Positive for dizziness (vertigo), headaches and numbness. Negative for light-headedness.  Hematological:  Does not bruise/bleed easily.  Psychiatric/Behavioral:  Positive for sleep disturbance. Negative for depression. The patient is not nervous/anxious.      PHYSICAL EXAM:  ECOG PERFORMANCE STATUS: 1 - Symptomatic but completely ambulatory  Vitals:   03/30/24 0837  BP: 136/69  Pulse: 74  Resp: 18  Temp: 99.2 F (37.3 C)  SpO2: 96%   Filed Weights   03/30/24 0837  Weight: 174 lb (78.9 kg)   Physical Exam Constitutional:      Appearance: Normal appearance. She is obese.  Cardiovascular:     Heart sounds: Normal heart sounds.  Pulmonary:     Breath sounds: Normal breath sounds.  Neurological:     General: No focal deficit present.     Mental Status: Mental status is at baseline.  Psychiatric:        Behavior: Behavior normal. Behavior is cooperative.    PAST MEDICAL/SURGICAL HISTORY:  Past Medical History:  Diagnosis Date   Abnormal Pap smear of cervix    Anemia    issue with red blood cells being small   Anxiety    Arthritis    Carpal tunnel syndrome  right   Depression    Diabetes mellitus without complication (HCC)    DVT (deep venous thrombosis) (HCC) 03/2021   left arm   GERD (gastroesophageal reflux disease)    High blood pressure    History of hiatal hernia    Left breast mass    Pre-diabetes    Past Surgical History:  Procedure Laterality Date   BIOPSY  02/05/2022   Procedure: BIOPSY;  Surgeon: Cindie Carlin POUR, DO;  Location: [facility];  Service: Endoscopy;;   COLONOSCOPY  2022   Megan Straughan: 7 polyps removed, has to return in one year   COLONOSCOPY  09/2021   Megan Straughan: 12 polyps removed   ESOPHAGOGASTRODUODENOSCOPY (EGD) WITH PROPOFOL  N/A 02/05/2022   Procedure:  ESOPHAGOGASTRODUODENOSCOPY (EGD) WITH PROPOFOL ;  Surgeon: Cindie Carlin POUR, DO;  Location: [facility];  Service: Endoscopy;  Laterality: N/A;  2:00pm   KNEE ARTHROSCOPY Right 10/28/2022   Procedure: RIGHT KNEE ARTHROSCOPY, DEBRIDEMENT;  Surgeon: Addie Cordella Hamilton, MD;  Location: [facility];  Service: Orthopedics;  Laterality: Right;   left shoulder arthroscopy  12/2020   SHOULDER ARTHROSCOPY WITH OPEN ROTATOR CUFF REPAIR AND DISTAL CLAVICLE ACROMINECTOMY Left 03/21/2022   Procedure: LEFT SHOULDER ARTHOSCOPY, DEBRIDEMENT,  MANIPULATION, ROTATOR CUFF REPAIR, ARTHROSCOPIC DISTAL CLAVICLE EXCISION;  Surgeon: Addie Cordella Hamilton, MD;  Location: [facility];  Service: Orthopedics;  Laterality: Left;   TUBAL LIGATION      SOCIAL HISTORY:  Social History   Socioeconomic History   Marital status: Single    Spouse name: Not on file   Number of children: 3   Years of education: Not on file   Highest education level: Some college, no degree  Occupational History   Not on file  Tobacco Use   Smoking status: Every Day    Current packs/day: 0.50    Types: Cigarettes   Smokeless tobacco: Never   Tobacco comments:    Working on cutting down on smoking. Down to 8 cigarrettes a day now.  Vaping Use   Vaping status: Never Used  Substance and Sexual Activity   Alcohol use: Not Currently   Drug use: Not Currently   Sexual activity: Yes    Birth control/protection: None  Other Topics Concern   Not on file  Social History Narrative   Lives at home with 2 daughters   Right handed   Caffeine: about 2-3 per day    Social Drivers of Health   Financial Resource Strain: Low Risk  (02/05/2024)   Received from [facility]   Overall Financial Resource Strain (CARDIA)    How hard is it for you to pay for the very basics like food, housing, medical care, and heating?: Not hard at all  Food Insecurity: No Food Insecurity (03/22/2024)   Hunger Vital Sign    Worried About Running Out of Food  in the Last Year: Never true    Ran Out of Food in the Last Year: Never true  Recent Concern: Food Insecurity - Food Insecurity Present (02/05/2024)   Received from [facility]   Hunger Vital Sign    Within the past 12 months, you worried that your food would run out before you got the money to buy more.: Sometimes true    Within the past 12 months, the food you bought just didn't last and you didn't have money to get more.: Sometimes true  Transportation Needs: No Transportation Needs (03/22/2024)   PRAPARE - Transportation    Lack of  Transportation (Medical): No    Lack of Transportation (Non-Medical): No  Physical Activity: Insufficiently Active (02/10/2024)   Received from [facility]   Exercise Vital Sign    On average, how many days per week do you engage in moderate to strenuous exercise (like a brisk walk)?: 3 days    On average, how many minutes do you engage in exercise at this level?: 30 min  Stress: No Stress Concern Present (02/10/2024)   Received from [facility] - Occupational Stress Questionnaire    Do you feel stress - tense, restless, nervous, or anxious, or unable to sleep at night because your mind is troubled all the time - these days?: Not at all  Social Connections: Moderately Isolated (10/25/2021)   Received from [facility]   Social Connection and Isolation Panel    In a typical week, how many times do you talk on the phone with family, friends, or neighbors?: More than three times a week    How often do you get together with friends or relatives?: Never    How often do you attend church or religious services?: More than 4 times per year    Do you belong to any clubs or organizations such as church groups, unions, fraternal or athletic groups, or school groups?: No    How often do you attend meetings of the clubs or organizations you belong to?: Never    Are you married, widowed, divorced, separated, never  married, or living with a partner?: Never married  Intimate Partner Violence: Not At Risk (03/22/2024)   Humiliation, Afraid, Rape, and Kick questionnaire    Fear of Current or Ex-Partner: No    Emotionally Abused: No    Physically Abused: No    Sexually Abused: No    FAMILY HISTORY:  Family History  Problem Relation Age of Onset   Lung cancer Mother    Other Father        multiple issues, doesn't know history for sure   Prostate cancer Father    Stroke Father    Aneurysm Paternal Grandmother    Breast cancer Maternal Aunt    Prostate cancer Maternal Uncle    Alcoholism Maternal Uncle    Aneurysm Cousin        paternal side   Aneurysm Cousin        paternal side    Breast cancer Cousin        maternal side    High blood pressure Other        father's side    Cerebral aneurysm Other        cerebral, on father's side    Alcoholism Other        maternal side    Colon cancer Neg Hx     CURRENT MEDICATIONS:  Outpatient Encounter Medications as of 03/30/2024  Medication Sig   ACCU-CHEK GUIDE test strip Use 1 each to check blood glucose four times daily   Accu-Chek Softclix Lancets lancets 1 each by Other route 4 (four) times daily.   acetaminophen  (TYLENOL ) 325 MG tablet Take 650 mg by mouth every 6 (six) hours as needed for moderate pain.   ARIPiprazole  (ABILIFY ) 10 MG tablet Take 10 mg by mouth daily.   atenolol  (TENORMIN ) 25 MG tablet Take 1 tablet (25 mg total) by mouth daily.   atorvastatin  (LIPITOR) 10 MG tablet Take 10 mg by mouth daily.   baclofen  (LIORESAL ) 10 MG tablet Take  10 mg by mouth 3 (three) times daily as needed (Back pain).   Blood Glucose Monitoring Suppl (ACCU-CHEK GUIDE ME) w/Device KIT See admin instructions.   chlorthalidone (HYGROTON) 25 MG tablet Take 25 mg by mouth every evening.   Continuous Glucose Sensor (DEXCOM G7 SENSOR) MISC Use to continuously monitor glucose. Change sensor every 10 days.   DULoxetine  (CYMBALTA ) 30 MG capsule Take 30 mg by  mouth daily.   ferrous sulfate  325 (65 FE) MG EC tablet Take 1 tablet (325 mg total) by mouth daily with breakfast. (Patient taking differently: Take 325 mg by mouth once a week. Sunday)   furosemide  (LASIX ) 20 MG tablet Take 1 tablet (20 mg total) by mouth daily as needed.   gabapentin  (NEURONTIN ) 300 MG capsule Take 300 mg by mouth in the morning.   insulin  aspart (NOVOLOG ) 100 UNIT/ML injection Inject 5 Units into the skin 3 (three) times daily with meals.   insulin  glargine (LANTUS  SOLOSTAR) 100 UNIT/ML Solostar Pen Inject 35 Units into the skin daily at 6 PM.   lisinopril  (ZESTRIL ) 5 MG tablet Take 1 tablet (5 mg total) by mouth daily.   magnesium  oxide (MAG-OX) 400 (240 Mg) MG tablet Take 1 tablet (400 mg total) by mouth daily.   meclizine (ANTIVERT) 25 MG tablet Take 25 mg by mouth 3 (three) times daily as needed for dizziness.   OZEMPIC , 0.25 OR 0.5 MG/DOSE, 2 MG/3ML SOPN SMARTSIG:0.25 Milligram(s) SUB-Q Once a Week   pantoprazole  (PROTONIX ) 40 MG tablet Take 1 tablet (40 mg total) by mouth 2 (two) times daily before a meal.   spironolactone  (ALDACTONE ) 25 MG tablet Take 12.5 mg by mouth daily.   traZODone  (DESYREL ) 100 MG tablet Take 100 mg by mouth in the morning. Third shift worker 6pm-6am   No facility-administered encounter medications on file as of 03/30/2024.    ALLERGIES:  Allergies  Allergen Reactions   Ferumoxytol  Other (See Comments)    Hot flash, nausea, chest pressure    Hydrochlorothiazide Other (See Comments)     urinary frequency; was going every 20 minutes and was causing problems at work    LABORATORY DATA:  I have reviewed the labs as listed.  CBC    Component Value Date/Time   WBC 8.5 03/22/2024 0727   RBC 5.99 (H) 03/22/2024 0727   HGB 16.7 (H) 03/22/2024 0727   HGB 17.7 (H) 03/22/2024 0727   HCT 49.4 (H) 03/22/2024 0727   HCT 52.0 (H) 03/22/2024 0727   PLT 297 03/22/2024 0727   MCV 82.5 03/22/2024 0727   MCH 27.9 03/22/2024 0727   MCHC 33.8  03/22/2024 0727   RDW 12.6 03/22/2024 0727   LYMPHSABS 2.6 03/17/2024 1357   MONOABS 0.8 03/17/2024 1357   EOSABS 0.1 03/17/2024 1357   BASOSABS 0.1 03/17/2024 1357      Latest Ref Rng & Units 03/23/2024    2:58 AM 03/22/2024    4:15 PM 03/22/2024   11:57 AM  CMP  Glucose 70 - 99 mg/dL 693  800  652   BUN 6 - 20 mg/dL 11  14  17    Creatinine 0.44 - 1.00 mg/dL 9.21  9.22  8.99   Sodium 135 - 145 mmol/L 132  137  132   Potassium 3.5 - 5.1 mmol/L 3.4  3.6  3.8   Chloride 98 - 111 mmol/L 99  100  97   CO2 22 - 32 mmol/L 25  26  24    Calcium  8.9 - 10.3 mg/dL 8.8  9.7  9.6     DIAGNOSTIC IMAGING:  I have independently reviewed the relevant imaging and discussed with the patient.   WRAP UP:  All questions were answered. The patient knows to call the clinic with any problems, questions or concerns.  Medical decision making: Moderate  Time spent on visit: I spent 20 minutes counseling the patient face to face. The total time spent in the appointment was 30 minutes and more than 50% was on counseling.  Pleasant CHRISTELLA Barefoot, PA-C  03/30/24 9:07 AM

## 2024-03-29 NOTE — Telephone Encounter (Signed)
 Ok, will wait for CGM readings to determine best changes to make to her regimen.

## 2024-03-30 ENCOUNTER — Inpatient Hospital Stay: Attending: Hematology | Admitting: Physician Assistant

## 2024-03-30 DIAGNOSIS — G4733 Obstructive sleep apnea (adult) (pediatric): Secondary | ICD-10-CM | POA: Diagnosis not present

## 2024-03-30 DIAGNOSIS — D500 Iron deficiency anemia secondary to blood loss (chronic): Secondary | ICD-10-CM

## 2024-03-30 DIAGNOSIS — D751 Secondary polycythemia: Secondary | ICD-10-CM | POA: Diagnosis not present

## 2024-03-30 DIAGNOSIS — Z801 Family history of malignant neoplasm of trachea, bronchus and lung: Secondary | ICD-10-CM | POA: Diagnosis not present

## 2024-03-30 DIAGNOSIS — F17210 Nicotine dependence, cigarettes, uncomplicated: Secondary | ICD-10-CM | POA: Diagnosis not present

## 2024-03-30 DIAGNOSIS — D563 Thalassemia minor: Secondary | ICD-10-CM | POA: Diagnosis not present

## 2024-03-30 DIAGNOSIS — Z8042 Family history of malignant neoplasm of prostate: Secondary | ICD-10-CM | POA: Diagnosis not present

## 2024-03-30 DIAGNOSIS — D509 Iron deficiency anemia, unspecified: Secondary | ICD-10-CM | POA: Insufficient documentation

## 2024-03-30 DIAGNOSIS — Z803 Family history of malignant neoplasm of breast: Secondary | ICD-10-CM | POA: Diagnosis not present

## 2024-03-30 DIAGNOSIS — Z800 Family history of malignant neoplasm of digestive organs: Secondary | ICD-10-CM | POA: Diagnosis not present

## 2024-03-30 DIAGNOSIS — Z7982 Long term (current) use of aspirin: Secondary | ICD-10-CM | POA: Diagnosis not present

## 2024-03-30 NOTE — Telephone Encounter (Signed)
 Patient was made aware. She verbalized understanding.

## 2024-03-30 NOTE — Patient Instructions (Signed)
 [facility] at [facility] **VISIT SUMMARY & IMPORTANT INSTRUCTIONS **   You were seen today by Pleasant Barefoot PA-C for your follow-up visit.    IRON DEFICIENCY ANEMIA: Your iron levels look better.  You should continue taking iron pill (ferrous sulfate  325 mg) twice weekly.  ELEVATED HEMOGLOBIN Your elevated hemoglobin is likely due to your tobacco use and your sleep apnea. Continue to work on smoking cessation and using your CPAP. Continue taking aspirin  81 mg daily to decrease your risk of blood clot, heart attack, and stroke from elevated blood levels.  FOLLOW-UP APPOINTMENT: 1 year  ** Thank you for trusting me with your healthcare!  I strive to provide all of my patients with quality care at each visit.  If you receive a survey for this visit, I would be so grateful to you for taking the time to provide feedback.  Thank you in advance!  ~ Kendalyn Cranfield                   Dr. Alean Stands   &   Pleasant Barefoot, PA-C   - - - - - - - - - - - - - - - - - -    Thank you for choosing [facility] at [facility] to provide your oncology and hematology care.  To afford each patient quality time with our provider, please arrive at least 15 minutes before your scheduled appointment time.   If you have a lab appointment with the Cancer Center please come in thru the Main Entrance and check in at the main information desk.  You need to re-schedule your appointment should you arrive 10 or more minutes late.  We strive to give you quality time with our providers, and arriving late affects you and other patients whose appointments are after yours.  Also, if you no show three or more times for appointments you may be dismissed from the clinic at the providers discretion.     Again, thank you for choosing [facility].  Our hope is that these requests will decrease the amount of time that you wait before being seen by our physicians.        _____________________________________________________________  Should you have questions after your visit to [facility], please contact our office at [phone] and follow the prompts.  Our office hours are 8:00 a.m. and 4:30 p.m. Monday - Friday.  Please note that voicemails left after 4:00 p.m. may not be returned until the following business day.  We are closed weekends and major holidays.  You do have access to a nurse 24-7, just call the main number to the clinic [phone] and do not press any options, hold on the line and a nurse will answer the phone.    For prescription refill requests, have your pharmacy contact our office and allow 72 hours.

## 2024-03-31 ENCOUNTER — Encounter (HOSPITAL_COMMUNITY): Payer: Self-pay | Admitting: Hematology

## 2024-04-02 ENCOUNTER — Encounter: Payer: Self-pay | Admitting: Gastroenterology

## 2024-04-02 DIAGNOSIS — G4733 Obstructive sleep apnea (adult) (pediatric): Secondary | ICD-10-CM | POA: Diagnosis not present

## 2024-04-09 ENCOUNTER — Encounter (HOSPITAL_COMMUNITY): Payer: Self-pay | Admitting: Oncology

## 2024-04-14 ENCOUNTER — Encounter (HOSPITAL_COMMUNITY): Payer: Self-pay | Admitting: Oncology

## 2024-04-16 ENCOUNTER — Encounter: Payer: Self-pay | Admitting: Nurse Practitioner

## 2024-04-16 ENCOUNTER — Ambulatory Visit (INDEPENDENT_AMBULATORY_CARE_PROVIDER_SITE_OTHER): Admitting: Nurse Practitioner

## 2024-04-16 ENCOUNTER — Encounter (HOSPITAL_COMMUNITY): Payer: Self-pay | Admitting: Oncology

## 2024-04-16 VITALS — BP 106/72 | HR 77 | Ht 61.0 in | Wt 181.4 lb

## 2024-04-16 DIAGNOSIS — Z7985 Long-term (current) use of injectable non-insulin antidiabetic drugs: Secondary | ICD-10-CM | POA: Diagnosis not present

## 2024-04-16 DIAGNOSIS — I10 Essential (primary) hypertension: Secondary | ICD-10-CM | POA: Diagnosis not present

## 2024-04-16 DIAGNOSIS — E1165 Type 2 diabetes mellitus with hyperglycemia: Secondary | ICD-10-CM | POA: Diagnosis not present

## 2024-04-16 DIAGNOSIS — Z794 Long term (current) use of insulin: Secondary | ICD-10-CM | POA: Diagnosis not present

## 2024-04-16 MED ORDER — INSULIN ASPART 100 UNIT/ML IJ SOLN: 10.0000 [IU] | Freq: Three times a day (TID) | INTRAMUSCULAR | 3 refills | Status: DC

## 2024-04-16 MED ORDER — PEN NEEDLES 31G X 6 MM MISC: 6 refills | Status: DC

## 2024-04-16 MED ORDER — LANTUS SOLOSTAR 100 UNIT/ML ~~LOC~~ SOPN
60.0000 [IU] | PEN_INJECTOR | Freq: Every day | SUBCUTANEOUS | 3 refills | Status: DC
Start: 2024-04-16 — End: 2024-07-21

## 2024-04-16 MED ORDER — SEMAGLUTIDE (1 MG/DOSE) 4 MG/3ML ~~LOC~~ SOPN: 1.0000 mg | PEN_INJECTOR | SUBCUTANEOUS | 1 refills | Status: DC

## 2024-04-16 NOTE — Progress Notes (Signed)
 Endocrinology Follow Up Note       04/16/2024, 11:18 AM   Subjective:    Patient ID: Suzanne Lawrence, female    DOB: 01/24/1971.  Suzanne Lawrence is being seen in follow up after being seen in consultation for management of currently uncontrolled symptomatic diabetes requested by  Trudy Vaughn FALCON, MD.   Past Medical History:  Diagnosis Date   Abnormal Pap smear of cervix    Anemia    issue with red blood cells being small   Anxiety    Arthritis    Carpal tunnel syndrome    right   Depression    Diabetes mellitus without complication (HCC)    DVT (deep venous thrombosis) (HCC) 03/2021   left arm   GERD (gastroesophageal reflux disease)    High blood pressure    History of hiatal hernia    Left breast mass    Pre-diabetes     Past Surgical History:  Procedure Laterality Date   BIOPSY  02/05/2022   Procedure: BIOPSY;  Surgeon: Cindie Carlin POUR, DO;  Location: [facility];  Service: Endoscopy;;   COLONOSCOPY  2022   Megan Straughan: 7 polyps removed, has to return in one year   COLONOSCOPY  09/2021   Megan Straughan: 12 polyps removed   ESOPHAGOGASTRODUODENOSCOPY (EGD) WITH PROPOFOL  N/A 02/05/2022   Procedure: ESOPHAGOGASTRODUODENOSCOPY (EGD) WITH PROPOFOL ;  Surgeon: Cindie Carlin POUR, DO;  Location: [facility];  Service: Endoscopy;  Laterality: N/A;  2:00pm   KNEE ARTHROSCOPY Right 10/28/2022   Procedure: RIGHT KNEE ARTHROSCOPY, DEBRIDEMENT;  Surgeon: Addie Cordella Hamilton, MD;  Location: [facility];  Service: Orthopedics;  Laterality: Right;   left shoulder arthroscopy  12/2020   SHOULDER ARTHROSCOPY WITH OPEN ROTATOR CUFF REPAIR AND DISTAL CLAVICLE ACROMINECTOMY Left 03/21/2022   Procedure: LEFT SHOULDER ARTHOSCOPY, DEBRIDEMENT,  MANIPULATION, ROTATOR CUFF REPAIR, ARTHROSCOPIC DISTAL CLAVICLE EXCISION;  Surgeon: Addie Cordella Hamilton, MD;  Location: [facility];  Service: Orthopedics;   Laterality: Left;   TUBAL LIGATION      Social History   Socioeconomic History   Marital status: Single    Spouse name: Not on file   Number of children: 3   Years of education: Not on file   Highest education level: Some college, no degree  Occupational History   Not on file  Tobacco Use   Smoking status: Every Day    Current packs/day: 0.50    Types: Cigarettes   Smokeless tobacco: Never   Tobacco comments:    Working on cutting down on smoking. Down to 8 cigarrettes a day now.  Vaping Use   Vaping status: Never Used  Substance and Sexual Activity   Alcohol use: Not Currently   Drug use: Not Currently   Sexual activity: Yes    Birth control/protection: None  Other Topics Concern   Not on file  Social History Narrative   Lives at home with 2 daughters   Right handed   Caffeine: about 2-3 per day    Social Drivers of Health   Financial Resource Strain: Low Risk  (02/05/2024)   Received from [facility]   Overall Financial Resource Strain (CARDIA)    How  hard is it for you to pay for the very basics like food, housing, medical care, and heating?: Not hard at all  Food Insecurity: No Food Insecurity (03/22/2024)   Hunger Vital Sign    Worried About Running Out of Food in the Last Year: Never true    Ran Out of Food in the Last Year: Never true  Recent Concern: Food Insecurity - Food Insecurity Present (02/05/2024)   Received from [facility]   Hunger Vital Sign    Within the past 12 months, you worried that your food would run out before you got the money to buy more.: Sometimes true    Within the past 12 months, the food you bought just didn't last and you didn't have money to get more.: Sometimes true  Transportation Needs: No Transportation Needs (03/22/2024)   PRAPARE - Administrator, Civil Service (Medical): No    Lack of Transportation (Non-Medical): No  Physical Activity: Insufficiently Active (02/10/2024)   Received from [facility]    Exercise Vital Sign    On average, how many days per week do you engage in moderate to strenuous exercise (like a brisk walk)?: 3 days    On average, how many minutes do you engage in exercise at this level?: 30 min  Stress: No Stress Concern Present (02/10/2024)   Received from [facility] - Occupational Stress Questionnaire    Do you feel stress - tense, restless, nervous, or anxious, or unable to sleep at night because your mind is troubled all the time - these days?: Not at all  Social Connections: Moderately Isolated (10/25/2021)   Received from [facility]   Social Connection and Isolation Panel    In a typical week, how many times do you talk on the phone with family, friends, or neighbors?: More than three times a week    How often do you get together with friends or relatives?: Never    How often do you attend church or religious services?: More than 4 times per year    Do you belong to any clubs or organizations such as church groups, unions, fraternal or athletic groups, or school groups?: No    How often do you attend meetings of the clubs or organizations you belong to?: Never    Are you married, widowed, divorced, separated, never married, or living with a partner?: Never married    Family History  Problem Relation Age of Onset   Lung cancer Mother    Other Father        multiple issues, doesn't know history for sure   Prostate cancer Father    Stroke Father    Aneurysm Paternal Grandmother    Breast cancer Maternal Aunt    Prostate cancer Maternal Uncle    Alcoholism Maternal Uncle    Aneurysm Cousin        paternal side   Aneurysm Cousin        paternal side    Breast cancer Cousin        maternal side    High blood pressure Other        father's side    Cerebral aneurysm Other        cerebral, on father's side    Alcoholism Other        maternal side    Colon cancer Neg Hx     Outpatient Encounter Medications  as of 04/16/2024  Medication Sig   ACCU-CHEK GUIDE test strip Use 1 each to check blood glucose four times daily   Accu-Chek Softclix Lancets lancets 1 each by Other route 4 (four) times daily.   acetaminophen  (TYLENOL ) 325 MG tablet Take 650 mg by mouth every 6 (six) hours as needed for moderate pain.   ARIPiprazole  (ABILIFY ) 10 MG tablet Take 10 mg by mouth daily.   atenolol  (TENORMIN ) 25 MG tablet Take 1 tablet (25 mg total) by mouth daily.   atorvastatin  (LIPITOR) 10 MG tablet Take 10 mg by mouth daily.   baclofen  (LIORESAL ) 10 MG tablet Take 10 mg by mouth 3 (three) times daily as needed (Back pain).   Blood Glucose Monitoring Suppl (ACCU-CHEK GUIDE ME) w/Device KIT See admin instructions.   chlorthalidone (HYGROTON) 25 MG tablet Take 25 mg by mouth every evening.   Continuous Glucose Sensor (DEXCOM G7 SENSOR) MISC Use to continuously monitor glucose. Change sensor every 10 days.   DULoxetine  (CYMBALTA ) 30 MG capsule Take 30 mg by mouth daily.   ferrous sulfate  325 (65 FE) MG EC tablet Take 1 tablet (325 mg total) by mouth daily with breakfast. (Patient taking differently: Take 325 mg by mouth once a week. Sunday)   furosemide  (LASIX ) 20 MG tablet Take 1 tablet (20 mg total) by mouth daily as needed.   gabapentin  (NEURONTIN ) 300 MG capsule Take 300 mg by mouth in the morning.   Insulin  Pen Needle (PEN NEEDLES) 31G X 6 MM MISC Use to inject insulin  4 times daily   lisinopril  (ZESTRIL ) 5 MG tablet Take 1 tablet (5 mg total) by mouth daily.   meclizine (ANTIVERT) 25 MG tablet Take 25 mg by mouth 3 (three) times daily as needed for dizziness.   pantoprazole  (PROTONIX ) 40 MG tablet Take 1 tablet (40 mg total) by mouth 2 (two) times daily before a meal.   Semaglutide , 1 MG/DOSE, 4 MG/3ML SOPN Inject 1 mg as directed once a week.   spironolactone  (ALDACTONE ) 25 MG tablet Take 12.5 mg by mouth daily.   traZODone  (DESYREL ) 100 MG tablet Take 100 mg by mouth in the morning. Third shift worker 6pm-6am    [DISCONTINUED] insulin  aspart (NOVOLOG ) 100 UNIT/ML injection Inject 5 Units into the skin 3 (three) times daily with meals. (Patient taking differently: Inject 10-16 Units into the skin 3 (three) times daily with meals.)   [DISCONTINUED] insulin  glargine (LANTUS  SOLOSTAR) 100 UNIT/ML Solostar Pen Inject 35 Units into the skin daily at 6 PM. (Patient taking differently: Inject 60 Units into the skin daily at 6 PM.)   [DISCONTINUED] OZEMPIC , 0.25 OR 0.5 MG/DOSE, 2 MG/3ML SOPN SMARTSIG:0.25 Milligram(s) SUB-Q Once a Week   insulin  aspart (NOVOLOG ) 100 UNIT/ML injection Inject 10-16 Units into the skin 3 (three) times daily with meals.   insulin  glargine (LANTUS  SOLOSTAR) 100 UNIT/ML Solostar Pen Inject 60 Units into the skin at bedtime.   magnesium  oxide (MAG-OX) 400 (240 Mg) MG tablet Take 1 tablet (400 mg total) by mouth daily.   No facility-administered encounter medications on file as of 04/16/2024.    ALLERGIES: Allergies  Allergen Reactions   Ferumoxytol  Other (See Comments)    Hot flash, nausea, chest pressure    Hydrochlorothiazide Other (See Comments)     urinary frequency; was going every 20 minutes and was causing problems at work    VACCINATION STATUS: Immunization History  Administered Date(s) Administered   Moderna Sars-Covid-2 Vaccination 07/06/2020    Diabetes She presents for her follow-up diabetic visit. She has type  2 diabetes mellitus. Onset time: diagnosed at approx age of 21 (in June) Her disease course has been worsening. There are no hypoglycemic associated symptoms. Associated symptoms include fatigue, polydipsia and polyuria. Pertinent negatives for diabetes include no blurred vision, no visual change and no weight loss. There are no hypoglycemic complications. Symptoms are improving. There are no diabetic complications. Risk factors for coronary artery disease include diabetes mellitus, dyslipidemia, family history, obesity, hypertension, sedentary lifestyle  and tobacco exposure. Current diabetic treatment includes intensive insulin  program (and Ozempic ). She is compliant with treatment most of the time. Her weight is fluctuating minimally. She is following a generally healthy diet. When asked about meal planning, she reported none. She has had a previous visit with a dietitian. She rarely participates in exercise. Her breakfast blood glucose range is generally >200 mg/dl. Her lunch blood glucose range is generally >200 mg/dl. Her dinner blood glucose range is generally >200 mg/dl. Her bedtime blood glucose range is generally >200 mg/dl. Her overall blood glucose range is >200 mg/dl. (She presents today with her CGM showing gross hyperglycemia overall.  Her most recent A1c on 03/22/24 was 13.1% increasing drastically from last visit of 7.9%.  Analysis of her CGM shows TIR 5%, TAR 95%, TBR 0% with a GMI of 10.6%.  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  She notes she has struggled with the timing of her medications.  She was treated with antibiotics for an infection last month as well.) An ACE inhibitor/angiotensin II receptor blocker is being taken. She sees a podiatrist.Eye exam is not current.    Review of systems  Constitutional: + Minimally fluctuating body weight,  current Body mass index is 34.28 kg/m. , no fatigue, no subjective hyperthermia, no subjective hypothermia Eyes: no blurry vision, no xerophthalmia ENT: no sore throat, no nodules palpated in throat, no dysphagia/odynophagia, no hoarseness Cardiovascular: no chest pain, no shortness of breath, no palpitations, no leg swelling Respiratory: no cough, no shortness of breath Gastrointestinal: no nausea/vomiting/diarrhea Musculoskeletal: no muscle/joint aches Skin: no rashes, no hyperemia Neurological: no tremors, no numbness, no tingling, no dizziness Psychiatric: no depression, no anxiety  Objective:     BP 106/72 (BP Location: Left Arm, Patient  Position: Sitting)   Pulse 77   Ht 5' 1 (1.549 m)   Wt 181 lb 6.4 oz (82.3 kg)   LMP 05/01/2021 (Approximate)   BMI 34.28 kg/m   Wt Readings from Last 3 Encounters:  04/16/24 181 lb 6.4 oz (82.3 kg)  03/30/24 174 lb (78.9 kg)  03/22/24 178 lb 12.7 oz (81.1 kg)     BP Readings from Last 3 Encounters:  04/16/24 106/72  03/30/24 136/69  03/23/24 128/61      Physical Exam- Limited  Constitutional:  Body mass index is 34.28 kg/m. , not in acute distress, normal state of mind Eyes:  EOMI, no exophthalmos Musculoskeletal: no gross deformities, strength intact in all four extremities, no gross restriction of joint movements Skin:  no rashes, no hyperemia Neurological: no tremor with outstretched hands   Diabetic Foot Exam - Simple   No data filed      CMP ( most recent) CMP     Component Value Date/Time   NA 132 (L) 03/23/2024 0258   K 3.4 (L) 03/23/2024 0258   CL 99 03/23/2024 0258   CO2 25 03/23/2024 0258   GLUCOSE 306 (H) 03/23/2024 0258   BUN 11 03/23/2024 0258   BUN 6 08/06/2022 0000  CREATININE 0.78 03/23/2024 0258   CALCIUM  8.8 (L) 03/23/2024 0258   PROT 8.0 03/22/2024 0727   ALBUMIN 4.4 03/22/2024 0727   AST 60 (H) 03/22/2024 0727   ALT 68 (H) 03/22/2024 0727   ALKPHOS 124 03/22/2024 0727   BILITOT 0.6 03/22/2024 0727   GFRNONAA >60 03/23/2024 0258     Diabetic Labs (most recent): Lab Results  Component Value Date   HGBA1C 13.1 (H) 03/22/2024   HGBA1C 7.4 (A) 12/25/2022   HGBA1C 12.6 (A) 09/02/2022   MICROALBUR 10 mg/L 09/30/2022     Lipid Panel ( most recent) Lipid Panel  No results found for: CHOL, TRIG, HDL, CHOLHDL, VLDL, LDLCALC, LDLDIRECT, LABVLDL    Lab Results  Component Value Date   TSH 3.600 03/17/2023   FREET4 0.93 03/17/2023         Assessment & Plan:   1) Type 2 diabetes mellitus with hyperglycemia, with long-term current use of insulin  (HCC)  She presents today with her CGM showing gross hyperglycemia  overall.  Her most recent A1c on 03/22/24 was 13.1% increasing drastically from last visit of 7.9%.  Analysis of her CGM shows TIR 5%, TAR 95%, TBR 0% with a GMI of 10.6%.  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  She notes she has struggled with the timing of her medications.  She was treated with antibiotics for an infection last month as well.  - Tyshay Adee has currently uncontrolled symptomatic type 2 DM since 53 years of age.   -Recent labs reviewed.  - I had a long discussion with her about the progressive nature of diabetes and the pathology behind its complications. -her diabetes is not currently complicated but she remains at a high risk for more acute and chronic complications which include CAD, CVA, CKD, retinopathy, and neuropathy. These are all discussed in detail with her.  The following Lifestyle Medicine recommendations according to American College of Lifestyle Medicine [facility]) were discussed and offered to patient and she agrees to start the journey:  A. Whole Foods, Plant-based plate comprising of fruits and vegetables, plant-based proteins, whole-grain carbohydrates was discussed in detail with the patient.   A list for source of those nutrients were also provided to the patient.  Patient will use only water or unsweetened tea for hydration. B.  The need to stay away from risky substances including alcohol, smoking; obtaining 7 to 9 hours of restorative sleep, at least 150 minutes of moderate intensity exercise weekly, the importance of healthy social connections,  and stress reduction techniques were discussed. C.  A full color page of  Calorie density of various food groups per pound showing examples of each food groups was provided to the patient.  - Nutritional counseling repeated at each appointment due to patients tendency to fall back in to old habits.  - The patient admits there is a room for improvement in their diet and drink  choices. -  Suggestion is made for the patient to avoid simple carbohydrates from their diet including Cakes, Sweet Desserts / Pastries, Ice Cream, Soda (diet and regular), Sweet Tea, Candies, Chips, Cookies, Sweet Pastries, Store Bought Juices, Alcohol in Excess of 1-2 drinks a day, Artificial Sweeteners, Coffee Creamer, and Sugar-free Products. This will help patient to have stable blood glucose profile and potentially avoid unintended weight gain.   - I encouraged the patient to switch to unprocessed or minimally processed complex starch and increased protein intake (animal or plant source), fruits,  and vegetables.   - Patient is advised to stick to a routine mealtimes to eat 3 meals a day and avoid unnecessary snacks (to snack only to correct hypoglycemia).  - she has been seeing Santana Duke, RDN, CDE for diabetes education, sees her today after her appt with me.  - I have approached her with the following individualized plan to manage her diabetes and patient agrees:   She is advised to adjust her Lantus  to 60 units SQ nightly and adjust her Novolog  to 10-16 units TID.  Will increase the Ozempic  to 1 mg SQ weekly.  -she is encouraged to continue using CGM to monitor glucose 4 times daily, before meals and before bed, and to call the clinic if she has readings less than 70 or above 300 for 3 tests in a row.    - she is warned not to take insulin  without proper monitoring per orders. - Adjustment parameters are given to her for hypo and hyperglycemia in writing.  - Specific targets for  A1c; LDL, HDL, and Triglycerides were discussed with the patient.  2) Blood Pressure /Hypertension:  her blood pressure is controlled to target.   she is advised to continue her current medications as prescribed by her PCP.  3) Lipids/Hyperlipidemia:    Her recent lipid panel from 02/28/23 shows controlled LDL of 99 and elevated triglycerides of 323.  She is advised to continue Lipitor 10 mg po daily-  currently managed by her PCP.    4)  Weight/Diet:  her Body mass index is 34.28 kg/m.  -  clearly complicating her diabetes care.   she is a candidate for weight loss. I discussed with her the fact that loss of 5 - 10% of her  current body weight will have the most impact on her diabetes management.  Exercise, and detailed carbohydrates information provided  -  detailed on discharge instructions.  5) Chronic Care/Health Maintenance: -she is on ACEI/ARB and not on Statin medications and is encouraged to initiate and continue to follow up with Ophthalmology, Dentist, Podiatrist at least yearly or according to recommendations, and advised to QUIT SMOKING. I have recommended yearly flu vaccine and pneumonia vaccine at least every 5 years; moderate intensity exercise for up to 150 minutes weekly; and sleep for at least 7 hours a day.  - she is advised to maintain close follow up with Trudy Vaughn FALCON, MD for primary care needs, as well as her other providers for optimal and coordinated care.      I spent  36  minutes in the care of the patient today including review of labs from CMP, Lipids, Thyroid Function, Hematology (current and previous including abstractions from other facilities); face-to-face time discussing  her blood glucose readings/logs, discussing hypoglycemia and hyperglycemia episodes and symptoms, medications doses, her options of short and long term treatment based on the latest standards of care / guidelines;  discussion about incorporating lifestyle medicine;  and documenting the encounter. Risk reduction counseling performed per USPSTF guidelines to reduce obesity and cardiovascular risk factors.     Please refer to Patient Instructions for Blood Glucose Monitoring and Insulin /Medications Dosing Guide  in media tab for additional information. Please  also refer to  Patient Self Inventory in the Media  tab for reviewed elements of pertinent patient history.  Delon Lunger  participated in the discussions, expressed understanding, and voiced agreement with the above plans.  All questions were answered to her satisfaction. she is encouraged to contact clinic should she  have any questions or concerns prior to her return visit.     Follow up plan: - Return in about 3 months (around 07/17/2024) for Diabetes F/U with A1c in office, No previsit labs, Bring meter and logs.   Benton Rio, [facility] [address] [place], [place] [address] Phone: [phone] Fax: [phone]  04/16/2024, 11:18 AM

## 2024-04-19 ENCOUNTER — Other Ambulatory Visit: Payer: Self-pay

## 2024-04-19 DIAGNOSIS — E1165 Type 2 diabetes mellitus with hyperglycemia: Secondary | ICD-10-CM

## 2024-04-19 MED ORDER — INSULIN LISPRO (1 UNIT DIAL) 100 UNIT/ML (KWIKPEN): 10.0000 [IU] | PEN_INJECTOR | Freq: Three times a day (TID) | SUBCUTANEOUS | 0 refills | Status: DC

## 2024-04-21 ENCOUNTER — Encounter: Attending: Nurse Practitioner | Admitting: Nutrition

## 2024-04-21 VITALS — Ht 61.0 in | Wt 183.0 lb

## 2024-04-21 DIAGNOSIS — E66812 Obesity, class 2: Secondary | ICD-10-CM | POA: Insufficient documentation

## 2024-04-21 DIAGNOSIS — E118 Type 2 diabetes mellitus with unspecified complications: Secondary | ICD-10-CM | POA: Diagnosis not present

## 2024-04-21 DIAGNOSIS — I10 Essential (primary) hypertension: Secondary | ICD-10-CM | POA: Diagnosis not present

## 2024-04-21 NOTE — Progress Notes (Unsigned)
 Medical Nutrition Therapy  Appointment Start time:  1030   Appointment End time:  1100   Primary concerns today: DM Type 2 Obesity  Referral diagnosis: E11.8, DE66.9 Preferred learning style: No preference  Learning readiness: Ready, change in progress    NUTRITION ASSESSMENT Folllow up  She is back on schedule taking her insulin  as prescribed.. Lantus  60 units at night and Humalog  10-16 units with meals.  Has a dexcom now and is more aware of her blood sugars.SABRA TIR 25% in range, 26% high and 49% very high. GMI 9.2%.  Usually eating 2 meals per day. Drinks a Glucerna in the morning. Gained 7 lbs in the last few weeks. Lab Results  Component Value Date   HGBA1C 13.1 (H) 03/22/2024    Goals set previously:  Eat 1 piece of fruit per meal- done Keep eating vegetables with lunch and dinner-improved Keep working out at [employer] twice a week.- Working on it. Lose 1 lb per week.-no weight loss recently   She is willing to work on making better food choices and committing to lifestyle changes.  Clinical Wt Readings from Last 3 Encounters:  04/16/24 181 lb 6.4 oz (82.3 kg)  03/30/24 174 lb (78.9 kg)  03/22/24 178 lb 12.7 oz (81.1 kg)   Ht Readings from Last 3 Encounters:  04/16/24 5' 1 (1.549 m)  03/22/24 5' 1 (1.549 m)  02/05/24 5' 1 (1.549 m)   There is no height or weight on file to calculate BMI. @BMIFA @ Facility age limit for growth %iles is 20 years. Facility age limit for growth %iles is 20 years.  Medical Hx:  Past Medical History:  Diagnosis Date   Abnormal Pap smear of cervix    Anemia    issue with red blood cells being small   Anxiety    Arthritis    Carpal tunnel syndrome    right   Depression    Diabetes mellitus without complication (HCC)    DVT (deep venous thrombosis) (HCC) 03/2021   left arm   GERD (gastroesophageal reflux disease)    High blood pressure    History of hiatal hernia    Left breast mass    Pre-diabetes     Medications:   Current Outpatient Medications on File Prior to Visit  Medication Sig Dispense Refill   ACCU-CHEK GUIDE test strip Use 1 each to check blood glucose four times daily 300 each 1   Accu-Chek Softclix Lancets lancets 1 each by Other route 4 (four) times daily. 300 each 1   acetaminophen  (TYLENOL ) 325 MG tablet Take 650 mg by mouth every 6 (six) hours as needed for moderate pain.     ARIPiprazole  (ABILIFY ) 10 MG tablet Take 10 mg by mouth daily.     atenolol  (TENORMIN ) 25 MG tablet Take 1 tablet (25 mg total) by mouth daily. 30 tablet 2   atorvastatin  (LIPITOR) 10 MG tablet Take 10 mg by mouth daily.     baclofen  (LIORESAL ) 10 MG tablet Take 10 mg by mouth 3 (three) times daily as needed (Back pain).     Blood Glucose Monitoring Suppl (ACCU-CHEK GUIDE ME) w/Device KIT See admin instructions.     chlorthalidone (HYGROTON) 25 MG tablet Take 25 mg by mouth every evening.     Continuous Glucose Sensor (DEXCOM G7 SENSOR) MISC Use to continuously monitor glucose. Change sensor every 10 days. 3 each 6   DULoxetine  (CYMBALTA ) 30 MG capsule Take 30 mg by mouth daily.     ferrous sulfate   325 (65 FE) MG EC tablet Take 1 tablet (325 mg total) by mouth daily with breakfast. (Patient taking differently: Take 325 mg by mouth once a week. Sunday) 90 tablet 3   furosemide  (LASIX ) 20 MG tablet Take 1 tablet (20 mg total) by mouth daily as needed. 30 tablet 2   gabapentin  (NEURONTIN ) 300 MG capsule Take 300 mg by mouth in the morning.     insulin  glargine (LANTUS  SOLOSTAR) 100 UNIT/ML Solostar Pen Inject 60 Units into the skin at bedtime. 60 mL 3   insulin  lispro (HUMALOG  KWIKPEN) 100 UNIT/ML KwikPen Inject 10-16 Units into the skin 3 (three) times daily with meals. 45 mL 0   Insulin  Pen Needle (PEN NEEDLES) 31G X 6 MM MISC Use to inject insulin  4 times daily 300 each 6   lisinopril  (ZESTRIL ) 5 MG tablet Take 1 tablet (5 mg total) by mouth daily. 30 tablet 2   meclizine (ANTIVERT) 25 MG tablet Take 25 mg by mouth 3  (three) times daily as needed for dizziness.     pantoprazole  (PROTONIX ) 40 MG tablet Take 1 tablet (40 mg total) by mouth 2 (two) times daily before a meal. 180 tablet 3   Semaglutide , 1 MG/DOSE, 4 MG/3ML SOPN Inject 1 mg as directed once a week. 9 mL 1   spironolactone  (ALDACTONE ) 25 MG tablet Take 12.5 mg by mouth daily.     traZODone  (DESYREL ) 100 MG tablet Take 100 mg by mouth in the morning. Third shift worker 6pm-6am     No current facility-administered medications on file prior to visit.    Labs:  Lab Results  Component Value Date   HGBA1C 13.1 (H) 03/22/2024   Decom est A1C 6.2%.  Notable Signs/Symptoms: None  Lifestyle & Dietary Hx LIves by herself.  Estimated daily fluid intake: 60 oz Supplements: Sleep: poor sleep- up and down. Is on trazadone Stress / self-care: anxiety and depression Current average weekly physical activity: chair exercises and will start [employer] twice a week.  24-Hr Dietary Recall First Meal: Glucerna once a day Lunch and Dinner- usually leftovers. Cooks at home.  Estimated Energy Needs Calories: 1200 Carbohydrate: 135g Protein: 90g Fat: 33g   NUTRITION DIAGNOSIS  NB-1.1 Food and nutrition-related knowledge deficit As related to Diabetes Type 2.  As evidenced by A1C 7.3%.   NUTRITION INTERVENTION  Nutrition education (E-1) on the following topics:  Nutrition and Diabetes education provided on My Plate, CHO counting, meal planning, portion sizes, timing of meals, avoiding snacks between meals unless having a low blood sugar, target ranges for A1C and blood sugars, signs/symptoms and treatment of hyper/hypoglycemia, monitoring blood sugars, taking medications as prescribed, benefits of exercising 30 minutes per day and prevention of complications of DM.  Lifestyle Medicine  - Whole Food, Plant Predominant Nutrition is highly recommended: Eat Plenty of vegetables, Mushrooms, fruits, Legumes, Whole Grains, Nuts, seeds in lieu of processed meats,  processed snacks/pastries red meat, poultry, eggs.    -It is better to avoid simple carbohydrates including: Cakes, Sweet Desserts, Ice Cream, Soda (diet and regular), Sweet Tea, Candies, Chips, Cookies, Store Bought Juices, Alcohol in Excess of  1-2 drinks a day, Lemonade,  Artificial Sweeteners, Doughnuts, Coffee Creamers, Sugar-free Products, etc, etc.  This is not a complete list.....  Exercise: If you are able: 30 -60 minutes a day ,4 days a week, or 150 minutes a week.  The longer the better.  Combine stretch, strength, and aerobic activities.  If you were told in the past that you  have high risk for cardiovascular diseases, you may seek evaluation by your heart doctor prior to initiating moderate to intense exercise programs.  MyFullplateliving and meal planning   Handouts Provided Include  Lifestyle Medicine Full Plate Living   Learning Style & Readiness for Change Teaching method utilized: Visual & Auditory  Demonstrated degree of understanding via: Teach Back  Barriers to learning/adherence to lifestyle change: anxiety and depression  Goals Established by Pt Take insulin  as prescribed and use sliding scale as prescribed. Try not to eat after 8 pm. Get back to exercising 2 times per week. Try the motion sensor lights in bedroom to help with better sleep. Create a bedtime routine.  MONITORING & EVALUATION Dietary intake, weekly physical activity, and weight in 3 month.  Next Steps  Patient is to work on meal planning.SABRA

## 2024-04-28 DIAGNOSIS — K760 Fatty (change of) liver, not elsewhere classified: Secondary | ICD-10-CM | POA: Diagnosis not present

## 2024-04-28 DIAGNOSIS — H8110 Benign paroxysmal vertigo, unspecified ear: Secondary | ICD-10-CM | POA: Diagnosis not present

## 2024-04-28 DIAGNOSIS — K219 Gastro-esophageal reflux disease without esophagitis: Secondary | ICD-10-CM | POA: Diagnosis not present

## 2024-05-04 ENCOUNTER — Telehealth: Payer: Self-pay | Admitting: Nutrition

## 2024-05-04 ENCOUNTER — Encounter: Payer: Self-pay | Admitting: Nutrition

## 2024-05-04 NOTE — Patient Instructions (Signed)
 Take insulin  as prescribed and use sliding scale as prescribed. Try not to eat after 8 pm. Get back to exercising 2 times per week. Try the motion sensor lights in bedroom to help with better sleep. Create a bedtime routine.

## 2024-05-04 NOTE — Telephone Encounter (Signed)
 TC to follow up on her DM. Has dexcom CGM. FBS 120-130's Before lunch 150's. Before dinner 150-180s Bedtime 160-180's Taking insulin  as prescribed now. Feels much better. No questions or concerns. Encouraged her to keep up the great job and call if any questions or concerns. She verbalized understanding.

## 2024-05-05 ENCOUNTER — Encounter: Payer: Self-pay | Admitting: Nutrition

## 2024-05-14 NOTE — Progress Notes (Unsigned)
 GI Office Note    Referring Provider: Trudy Vaughn FALCON, MD Primary Care Physician:  Trudy Vaughn FALCON, MD Primary Gastroenterologist: Carlin POUR. Cindie, DO  Date:  05/18/2024  ID:  Suzanne Lawrence, DOB 02-13-1971, MRN [id number]   Chief Complaint   Chief Complaint  Patient presents with   Fatty liver    Patient sent to us  today due to having a fatty liver. Patient has had Labs drawn 01/10/2024 which showed some elevated LFT's. She had a scan done at [facility] on 03/04/2024 which showed she had some fatty liver. Patient has occasional diarrhea alt with constipation. Denies any other gi related issues.    History of Present Illness  Suzanne Lawrence is a 53 y.o. female with a history of GERD, diabetes, left arm DVT, HTN, anxiety, arthritis, and depression presenting today for evaluation of fatty liver.   Colonoscopy January 2022 (Dr. Duwaine Jumper) - One 13 mm polyp in the distal transverse colon - One 4 mm polyp in the descending colon - One 6 mm polyp at 50 cm proximal to the anus - One 9 mm polyp at 40 cm proximal to the anus - One 7 mm polyp at 25 cm proximal to the anus - Two 5 mm polyps at 24 cm proximal to the anus - Repeat colonoscopy in 1 year for surveillance based on pathology results.                                                                   Colonoscopy February 2023 (Dr. Duwaine Husk): - Two 3 to 4 mm polyps in the sigmoid colon and at 20  cm proximal to the anus - Two 3 to 4 mm polyps in the descending colon,  - One 4 mm polyp in the descending colon - Four 3 to 4 mm polyps in the sigmoid colon, - Three 2 to 4 mm polyps at the recto-sigmoid colon - Path: hyperplastic polyps - Repeat in 3 years.    Patient placed on recall for TCS in February 2026.    Repeat labs in March 2023 with ferritin 5.6, iron 20, hemoglobin 8.4.  Underwent endometrial ablation in March 2023.  Received 2 iron infusions after following with [facility].   EGD  02/05/22: -normal esophagus -small hiatal hernia -gastritis s/po biopsy -3 gastric polyps removed s/p biopsy.  -Continue dexilant 60 mg daily.    RUQ US  08/09/22:  -The echogenicity of the liver is increased. This is a nonspecific finding but is most commonly seen with fatty infiltration of the liver.  -There are a few non-mass-like areas of decreased attenuation  including adjacent to the gallbladder fossa, which are favored to  represent areas of focal fatty sparing. No discrete hepatic mass  lesion identified.  -Hepatomegaly   Office visit 09/10/22.  Patient was taking omeprazole  once daily for GERD, reported rare breakthrough symptoms and takes super fate as needed.   denied any weight loss or lack of appetite.  Did note some feelings with fatigue after ablation but also was having vision troubles which suspected to be related to her diabetes as well as her hypertension.  She had reported some diarrhea couple months prior with stool stately polyp/capsules and was having 6-7/day but was resolved at time of  office visit.  Having bowel movement daily, sometimes soft and sometimes solid.  Follow regularly with endocrinology.  BP high that day, 190s over 90s.   OV 03/17/23. Carafate 3 times per day with omeprazole  once daily.  Recently started having diarrhea with about 5 pellets per day and occurring postprandially with mild abdominal cramping.  Unable to identify certain food triggers except for salad.  Denies any melena or BRBPR.  Denies any lack of appetite or weight loss.  No abdominal pain.Start pantoprazole  40 mg BID and stop omeprazole .  Reduce Carafate to as needed.  Check fecal elastase, TSH, and celiac serologies.  Recommended Imodium 1 mg 1-2 times daily.  Low-fat diet.    Labs 03/17/2023: Pancreatic elastase normal.  TSH normal.  Celiac serologies negative.   OV 05/19/23. No longer taking Carafate.  Doing well with PPI twice daily denied any nausea vomiting or abdominal pain.  Diarrhea have  resolved.  May have some with dietary indiscretion.  Taking vitamin D.  Advised to continue pantoprazole  40 mg twice daily, refill sent in.  Advised trial of weaning off famotidine .  Follow GERD diet.  Reassuringly diarrhea has subsided and only occurring intermittently with greasy foods.  Last office visit March 2025. Not having daily breakthrough reflux does seem to happen more in the evenings when at dinner.  Has not needed Tums at all prior to bed.  This likely may have been exacerbated by nocturnal eating.  Denied any other upper or lower GI symptoms. Advised to continue pantoprazole  40 mg BID, tums as needed. GERD lifestyle modifications discussed. Advised to continue iron daily or switch to every other day if needed. Follow up in Dec 25 or Jan 26 to discuss TCS.      Latest Ref Rng & Units 03/22/2024    7:27 AM 08/06/2022   12:00 AM 08/02/2022   12:00 AM  Hepatic Function  Total Protein 6.5 - 8.1 g/dL 8.0     Albumin 3.5 - 5.0 g/dL 4.4   5.3      AST 15 - 41 U/L 60  67     85      ALT 0 - 44 U/L 68  63     87      Alk Phosphatase 38 - 126 U/L 124   125      Total Bilirubin 0.0 - 1.2 mg/dL 0.6        This result is from an external source.   Referred by Dr. Trudy for evaluation of fatty liver. She reported to him she was due to see January 2026. Because referral was sent - appointment was made.   CT for lung cancer screening in June 2025 revealed some diffuse hepatic steatosis.  Also a 6 mm nodule in the right upper lobe and a 3 mm nodule in the right upper lobe with multiple additional pulmonary micronodules noted in scattered areas of centrilobular ground glass predominantly within the upper lobes which may represent sequelae of smoking-related lung disease.  RUQ US  03/04/2024: Borderline hepatomegaly and diffuse fatty change.  No intrahepatic lesion or dilated bile ducts.  No cholelithiasis or cholecystitis noted.  Hemoglobin A1c in July 13.1.  She was admitted to the hospital  during this time for 24 hours given hyperglycemia.  She was briefly on insulin  drip and then transfused into subcutaneous insulin .  Today:  Discussed the use of AI scribe software for clinical note transcription with the patient, who gave verbal consent to proceed.  She has a history  of diabetes, managed with Lantus , Humalog , and Ozempic . Recent ultrasound findings indicated fatty changes in the liver.  She experiences diarrhea multiple times a week, occurring about three times a day, mostly after meals, approximately 30 minutes to an hour post-ingestion. This has been more frequent in the past week, occurring three to four times, compared to a couple of times a week previously. She denies any relation of these symptoms to her recent initiation of Ozempic , which she started a few months ago. She also experiences cramping during episodes of diarrhea but has no blood in her stool or melena. This is possibly all occurring after the ingestion of higher fat containing meals and processed foods.  She has a history of constipation, requiring straining, but this occurs less frequently than diarrhea. Her current medications include pantoprazole  40 mg twice daily and Tums as needed for reflux, which is well-controlled except when triggered by specific foods or drinking water and then lying down to soon.  Her social history includes tobacco use, smoking about half a pack every day. No recent chest pain, shortness of breath, nausea, or vomiting. She reports occasional reflux symptoms, which are food-related. Denies any melena, BRBPR, rectal pain, or dysphagia.       Wt Readings from Last 5 Encounters:  05/18/24 182 lb 1.6 oz (82.6 kg)  04/21/24 183 lb (83 kg)  04/16/24 181 lb 6.4 oz (82.3 kg)  03/30/24 174 lb (78.9 kg)  03/22/24 178 lb 12.7 oz (81.1 kg)    Current Outpatient Medications  Medication Sig Dispense Refill   ACCU-CHEK GUIDE test strip Use 1 each to check blood glucose four times daily 300  each 1   Accu-Chek Softclix Lancets lancets 1 each by Other route 4 (four) times daily. 300 each 1   acetaminophen  (TYLENOL ) 325 MG tablet Take 650 mg by mouth every 6 (six) hours as needed for moderate pain.     ARIPiprazole  (ABILIFY ) 10 MG tablet Take 10 mg by mouth daily.     atenolol  (TENORMIN ) 25 MG tablet Take 1 tablet (25 mg total) by mouth daily. 30 tablet 2   atorvastatin  (LIPITOR) 10 MG tablet Take 10 mg by mouth daily.     baclofen  (LIORESAL ) 10 MG tablet Take 10 mg by mouth 3 (three) times daily as needed (Back pain).     Blood Glucose Monitoring Suppl (ACCU-CHEK GUIDE ME) w/Device KIT See admin instructions.     chlorthalidone (HYGROTON) 25 MG tablet Take 25 mg by mouth every evening.     Continuous Glucose Sensor (DEXCOM G7 SENSOR) MISC Use to continuously monitor glucose. Change sensor every 10 days. 3 each 6   DULoxetine  (CYMBALTA ) 30 MG capsule Take 30 mg by mouth daily.     ferrous sulfate  325 (65 FE) MG EC tablet Take 1 tablet (325 mg total) by mouth daily with breakfast. (Patient taking differently: Take 325 mg by mouth daily with breakfast. Q Sunday) 90 tablet 3   furosemide  (LASIX ) 20 MG tablet Take 1 tablet (20 mg total) by mouth daily as needed. 30 tablet 2   gabapentin  (NEURONTIN ) 300 MG capsule Take 300 mg by mouth in the morning.     insulin  glargine (LANTUS  SOLOSTAR) 100 UNIT/ML Solostar Pen Inject 60 Units into the skin at bedtime. 60 mL 3   insulin  lispro (HUMALOG  KWIKPEN) 100 UNIT/ML KwikPen Inject 10-16 Units into the skin 3 (three) times daily with meals. 45 mL 0   Insulin  Pen Needle (PEN NEEDLES) 31G X 6 MM MISC Use to  inject insulin  4 times daily 300 each 6   lisinopril  (ZESTRIL ) 5 MG tablet Take 1 tablet (5 mg total) by mouth daily. 30 tablet 2   meclizine (ANTIVERT) 25 MG tablet Take 25 mg by mouth 3 (three) times daily as needed for dizziness.     pantoprazole  (PROTONIX ) 40 MG tablet Take 1 tablet (40 mg total) by mouth 2 (two) times daily before a meal. 180  tablet 3   Semaglutide , 1 MG/DOSE, 4 MG/3ML SOPN Inject 1 mg as directed once a week. 9 mL 1   spironolactone  (ALDACTONE ) 25 MG tablet Take 12.5 mg by mouth daily.     traZODone  (DESYREL ) 100 MG tablet Take 100 mg by mouth in the morning. Third shift worker 6pm-6am     No current facility-administered medications for this visit.    Past Medical History:  Diagnosis Date   Abnormal Pap smear of cervix    Anemia    issue with red blood cells being small   Anxiety    Arthritis    Carpal tunnel syndrome    right   Depression    Diabetes mellitus without complication (HCC)    DVT (deep venous thrombosis) (HCC) 03/2021   left arm   GERD (gastroesophageal reflux disease)    High blood pressure    History of hiatal hernia    Left breast mass    Pre-diabetes     Past Surgical History:  Procedure Laterality Date   BIOPSY  02/05/2022   Procedure: BIOPSY;  Surgeon: Cindie Carlin POUR, DO;  Location: [facility];  Service: Endoscopy;;   COLONOSCOPY  2022   Megan Straughan: 7 polyps removed, has to return in one year   COLONOSCOPY  09/2021   Megan Straughan: 12 polyps removed   ESOPHAGOGASTRODUODENOSCOPY (EGD) WITH PROPOFOL  N/A 02/05/2022   Procedure: ESOPHAGOGASTRODUODENOSCOPY (EGD) WITH PROPOFOL ;  Surgeon: Cindie Carlin POUR, DO;  Location: [facility];  Service: Endoscopy;  Laterality: N/A;  2:00pm   KNEE ARTHROSCOPY Right 10/28/2022   Procedure: RIGHT KNEE ARTHROSCOPY, DEBRIDEMENT;  Surgeon: Addie Cordella Hamilton, MD;  Location: [facility];  Service: Orthopedics;  Laterality: Right;   left shoulder arthroscopy  12/2020   SHOULDER ARTHROSCOPY WITH OPEN ROTATOR CUFF REPAIR AND DISTAL CLAVICLE ACROMINECTOMY Left 03/21/2022   Procedure: LEFT SHOULDER ARTHOSCOPY, DEBRIDEMENT,  MANIPULATION, ROTATOR CUFF REPAIR, ARTHROSCOPIC DISTAL CLAVICLE EXCISION;  Surgeon: Addie Cordella Hamilton, MD;  Location: [facility];  Service: Orthopedics;  Laterality: Left;   TUBAL LIGATION      Family  History  Problem Relation Age of Onset   Lung cancer Mother    Other Father        multiple issues, doesn't know history for sure   Prostate cancer Father    Stroke Father    Aneurysm Paternal Grandmother    Breast cancer Maternal Aunt    Prostate cancer Maternal Uncle    Alcoholism Maternal Uncle    Aneurysm Cousin        paternal side   Aneurysm Cousin        paternal side    Breast cancer Cousin        maternal side    High blood pressure Other        father's side    Cerebral aneurysm Other        cerebral, on father's side    Alcoholism Other        maternal side    Colon cancer Neg Hx  Allergies as of 05/18/2024 - Review Complete 05/18/2024  Allergen Reaction Noted   Ferumoxytol  Other (See Comments) 12/18/2021   Hydrochlorothiazide Other (See Comments) 04/02/2017    Social History   Socioeconomic History   Marital status: Single    Spouse name: Not on file   Number of children: 3   Years of education: Not on file   Highest education level: Some college, no degree  Occupational History   Not on file  Tobacco Use   Smoking status: Every Day    Current packs/day: 0.50    Types: Cigarettes   Smokeless tobacco: Never   Tobacco comments:    Working on cutting down on smoking. Down to 8 cigarrettes a day now.  Vaping Use   Vaping status: Never Used  Substance and Sexual Activity   Alcohol use: Not Currently   Drug use: Not Currently   Sexual activity: Yes    Birth control/protection: None  Other Topics Concern   Not on file  Social History Narrative   Lives at home with 2 daughters   Right handed   Caffeine: about 2-3 per day    Social Drivers of Health   Financial Resource Strain: Low Risk  (02/05/2024)   Received from [facility]   Overall Financial Resource Strain (CARDIA)    How hard is it for you to pay for the very basics like food, housing, medical care, and heating?: Not hard at all  Food Insecurity: No Food Insecurity (03/22/2024)    Hunger Vital Sign    Worried About Running Out of Food in the Last Year: Never true    Ran Out of Food in the Last Year: Never true  Recent Concern: Food Insecurity - Food Insecurity Present (02/05/2024)   Received from [facility]   Hunger Vital Sign    Within the past 12 months, you worried that your food would run out before you got the money to buy more.: Sometimes true    Within the past 12 months, the food you bought just didn't last and you didn't have money to get more.: Sometimes true  Transportation Needs: No Transportation Needs (03/22/2024)   PRAPARE - Administrator, Civil Service (Medical): No    Lack of Transportation (Non-Medical): No  Physical Activity: Insufficiently Active (02/10/2024)   Received from [facility]   Exercise Vital Sign    On average, how many days per week do you engage in moderate to strenuous exercise (like a brisk walk)?: 3 days    On average, how many minutes do you engage in exercise at this level?: 30 min  Stress: No Stress Concern Present (02/10/2024)   Received from [facility] - Occupational Stress Questionnaire    Do you feel stress - tense, restless, nervous, or anxious, or unable to sleep at night because your mind is troubled all the time - these days?: Not at all  Social Connections: Moderately Isolated (10/25/2021)   Received from [facility]   Social Connection and Isolation Panel    In a typical week, how many times do you talk on the phone with family, friends, or neighbors?: More than three times a week    How often do you get together with friends or relatives?: Never    How often do you attend church or religious services?: More than 4 times per year    Do you belong to any clubs or organizations such  as church groups, unions, fraternal or athletic groups, or school groups?: No    How often do you attend meetings of the clubs or organizations you belong to?: Never     Are you married, widowed, divorced, separated, never married, or living with a partner?: Never married     Review of Systems   Gen: Denies fever, chills, anorexia. Denies fatigue, weakness, weight loss.  CV: Denies chest pain, palpitations, syncope, peripheral edema, and claudication. Resp: Denies dyspnea at rest, cough, wheezing, coughing up blood, and pleurisy. GI: See HPI Derm: Denies rash, itching, dry skin Psych: Denies depression, anxiety, memory loss, confusion. No homicidal or suicidal ideation.  Heme: Denies bruising, bleeding, and enlarged lymph nodes.  Physical Exam   BP 106/65 (BP Location: Right Arm, Patient Position: Sitting, Cuff Size: Large)   Pulse 71   Temp (!) 97.5 F (36.4 C) (Temporal)   Ht 5' 1 (1.549 m)   Wt 182 lb 1.6 oz (82.6 kg)   LMP 05/01/2021 (Approximate)   BMI 34.41 kg/m   General:   Alert and oriented. No distress noted. Pleasant and cooperative.  Head:  Normocephalic and atraumatic. Eyes:  Conjuctiva clear without scleral icterus. Mouth:  Oral mucosa pink and moist. Good dentition. No lesions. Lungs:  Clear to auscultation bilaterally. No wheezes, rales, or rhonchi. No distress.  Heart:  S1, S2 present without murmurs appreciated.  Abdomen:  +BS, soft, non-distended, protuberant.  Mild TTP to periumbilical region and epigastric region.  No rebound or guarding. No HSM or masses noted. Rectal: deferred Msk:  Symmetrical without gross deformities. Normal posture. Extremities:  Without edema. Neurologic:  Alert and  oriented x4 Psych:  Alert and cooperative. Normal mood and affect.  Assessment & Plan  Suzanne Lawrence is a 53 y.o. female presenting today to discuss fatty liver, also with complaints of diarrhea.    Nonalcoholic fatty liver disease Ultrasound shows fatty changes without cirrhosis, likely related to diabetes and hyperglycemia. Fat deposits may cause inflammation and elevated liver enzymes. The goal is to prevent progression to  chronic liver disease by controlling blood pressure, blood sugars, and cholesterol. Further assessment for fibrosis or other causes of elevated liver enzymes, such as viral or autoimmune conditions, is necessary. - Order blood work to assess liver function and rule out other causes of elevated liver enzymes (CBC, CMP, INR, ferritin, viral hepatitis panel, FibroSure, elf, ANA, ASMA, AMA, IgG, IgA, IgM) - Implement dietary modifications to improve liver health, including reducing fried foods and choosing leaner cuts of meat - Provided handout for education about Mediterranean diet. - Advised regular exercise up to 150 minutes weekly.  Alternating Diarrhea and constipation Diarrhea occurs more frequently than constipation, often postprandially, and may be related to dietary factors, particularly fatty foods. No clear relation to Ozempic  use or issues with dairy products. Diarrhea could be compensatory for hyperglycemia. - Provide dietary recommendations to reduce diarrhea, including avoiding fatty dressings and fried foods. - Monitor symptoms and adjust diet as needed. - Start a daily fiber supplement such as Metamucil  Gastroesophageal reflux disease (GERD) GERD symptoms are occasional and related to dietary choices, triggered by late eating or excessive water intake before lying down. Managed with pantoprazole  and Tums as needed. - Continue pantoprazole  40 mg twice daily. - Use Tums as needed for symptom relief. - GERD diet reinforced - Discussed tobacco cessation.  Iron deficiency anemia Has been maintained on oral iron once daily long-term.  Labs available to me from June with hemoglobin 16.7 but likely  this was secondary to hemoconcentration given elevated blood sugars.  In July her iron levels were stable and within normal limits. - Continue once daily oral iron - Recheck CBC today with liver workup.      Follow up   Follow up 4 months and discuss scheduling her surveillance colonoscopy  at that time.    Charmaine Melia, MSN, FNP-BC, AGACNP-BC [facility]

## 2024-05-18 ENCOUNTER — Encounter: Payer: Self-pay | Admitting: Gastroenterology

## 2024-05-18 ENCOUNTER — Ambulatory Visit (INDEPENDENT_AMBULATORY_CARE_PROVIDER_SITE_OTHER): Admitting: Gastroenterology

## 2024-05-18 VITALS — BP 106/65 | HR 71 | Temp 97.5°F | Ht 61.0 in | Wt 182.1 lb

## 2024-05-18 DIAGNOSIS — K5900 Constipation, unspecified: Secondary | ICD-10-CM | POA: Diagnosis not present

## 2024-05-18 DIAGNOSIS — K7581 Nonalcoholic steatohepatitis (NASH): Secondary | ICD-10-CM

## 2024-05-18 DIAGNOSIS — R197 Diarrhea, unspecified: Secondary | ICD-10-CM

## 2024-05-18 DIAGNOSIS — K219 Gastro-esophageal reflux disease without esophagitis: Secondary | ICD-10-CM | POA: Diagnosis not present

## 2024-05-18 DIAGNOSIS — Z860100 Personal history of colon polyps, unspecified: Secondary | ICD-10-CM

## 2024-05-18 DIAGNOSIS — D500 Iron deficiency anemia secondary to blood loss (chronic): Secondary | ICD-10-CM

## 2024-05-18 DIAGNOSIS — K760 Fatty (change of) liver, not elsewhere classified: Secondary | ICD-10-CM

## 2024-05-18 DIAGNOSIS — R7989 Other specified abnormal findings of blood chemistry: Secondary | ICD-10-CM

## 2024-05-18 NOTE — Patient Instructions (Addendum)
 YOUR PLAN:  -NONALCOHOLIC FATTY LIVER DISEASE: Nonalcoholic fatty liver disease is a condition where fat builds up in the liver, which can cause inflammation and elevated liver enzymes. This is often related to metabolic disease such as diabetes, hypertension, excessive weight, and hyperlipidemia. To prevent progression, you need to have good control of blood pressure, blood sugars, and cholesterol as well as regular exercise and a balanced diet and stable healthy weight.  We will order blood work to assess liver function and rule out other causes of elevated liver enzymes. Additionally, you should make dietary changes, such as reducing fried foods and choosing leaner cuts of meat.  Overall the Mediterranean diet is recommended.  I will attach a handout for you today with recommendations. 150 minutes of regular exercise is recommended weekly as well.  -ALTERNATING DIARRHEA AND CONSTIPATION: You are experiencing more frequent diarrhea, often after meals, which may be related to dietary factors, particularly fatty foods which could be secondary to the gallbladder or the pancreas. There is no clear relation to your use of Ozempic  or dairy products although these remain a possibility. Diarrhea could be a response to high blood sugar levels. We recommend dietary changes to reduce diarrhea, including avoiding fatty dressings and fried foods. Monitor your symptoms and adjust your diet as needed.  I would recommend that you start a daily fiber supplement such as Metamucil to help with regularity and avoid any periods of constipation especially given you are taking iron.  -GASTROESOPHAGEAL REFLUX DISEASE (GERD): GERD is a condition where stomach acid frequently flows back into the tube connecting your mouth and stomach, causing irritation. Your symptoms are occasional and related to dietary choices, such as late eating or drinking too much water before lying down. Continue taking pantoprazole  40 mg twice daily and  use Tums as needed for symptom relief.  Follow a GERD diet:  Avoid fried, fatty, greasy, spicy, citrus foods. Avoid caffeine and carbonated beverages. Avoid chocolate. Try eating 4-6 small meals a day rather than 3 large meals. Do not eat within 3 hours of laying down. Prop head of bed up on wood or bricks to create a 6 inch incline.  Continue taking your iron once daily.  Please have blood work completed at [facility].  We will call you with results once they have been received. Please allow 3-5 business days for review. Please do these fasting.  2 locations for [facility] in [place]:              1. [address], [place]              2. [address] [place]   We will plan to follow-up in 4 months and reassess how you are doing in regards to your bowel function, follow-up on your fatty liver, and discuss scheduling your colonoscopy.  It was a pleasure to see you today. I want to create trusting relationships with patients. If you receive a survey regarding your visit,  I greatly appreciate you taking time to fill this out on paper or through your MyChart. I value your feedback.  Charmaine Melia, MSN, FNP-BC, AGACNP-BC [facility]

## 2024-06-01 ENCOUNTER — Other Ambulatory Visit: Payer: Self-pay | Admitting: Nurse Practitioner

## 2024-06-01 DIAGNOSIS — E1165 Type 2 diabetes mellitus with hyperglycemia: Secondary | ICD-10-CM

## 2024-06-03 LAB — NASH FIBROSURE(R) PLUS

## 2024-06-07 LAB — NASH FIBROSURE(R) PLUS
ALPHA 2-MACROGLOBULINS, QN: 146 mg/dL (ref 110–276)
ALT (SGPT) P5P: 55 IU/L — ABNORMAL HIGH (ref 0–40)
AST (SGOT) P5P: 35 IU/L (ref 0–40)
Apolipoprotein A-1: 119 mg/dL (ref 116–209)
Bilirubin, Total: 0.3 mg/dL (ref 0.0–1.2)
Cholesterol, Total: 161 mg/dL (ref 100–199)
Fibrosis Score: 0.12 (ref 0.00–0.21)
GGT: 41 IU/L (ref 0–60)
Glucose: 88 mg/dL (ref 70–99)
Haptoglobin: 139 mg/dL (ref 33–346)
NASH Score: 0.54 — ABNORMAL HIGH (ref 0.00–0.25)
Steatosis Score: 0.7 — ABNORMAL HIGH (ref 0.00–0.40)
Triglycerides: 138 mg/dL (ref 0–149)

## 2024-06-07 LAB — IGG, IGA, IGM
IgA/Immunoglobulin A, Serum: 136 mg/dL (ref 87–352)
IgG (Immunoglobin G), Serum: 1175 mg/dL (ref 586–1602)
IgM (Immunoglobulin M), Srm: 122 mg/dL (ref 26–217)

## 2024-06-07 LAB — COMPREHENSIVE METABOLIC PANEL WITH GFR
ALT: 48 IU/L — ABNORMAL HIGH (ref 0–32)
AST: 29 IU/L (ref 0–40)
Albumin: 4.1 g/dL (ref 3.8–4.9)
Alkaline Phosphatase: 100 IU/L (ref 49–135)
BUN/Creatinine Ratio: 9 (ref 9–23)
BUN: 9 mg/dL (ref 6–24)
Bilirubin Total: 0.3 mg/dL (ref 0.0–1.2)
CO2: 23 mmol/L (ref 20–29)
Calcium: 9.4 mg/dL (ref 8.7–10.2)
Chloride: 105 mmol/L (ref 96–106)
Creatinine, Ser: 0.97 mg/dL (ref 0.57–1.00)
Globulin, Total: 2.6 g/dL (ref 1.5–4.5)
Glucose: 88 mg/dL (ref 70–99)
Potassium: 4.1 mmol/L (ref 3.5–5.2)
Sodium: 144 mmol/L (ref 134–144)
Total Protein: 6.7 g/dL (ref 6.0–8.5)
eGFR: 69 mL/min/1.73 (ref >59)

## 2024-06-07 LAB — HEPATITIS A ANTIBODY, IGM: Hep A IgM: NEGATIVE

## 2024-06-07 LAB — CBC
Hematocrit: 45.9 % (ref 34.0–46.6)
Hemoglobin: 15.1 g/dL (ref 11.1–15.9)
MCH: 27.9 pg (ref 26.6–33.0)
MCHC: 32.9 g/dL (ref 31.5–35.7)
MCV: 85 fL (ref 79–97)
Platelets: 345 x10E3/uL (ref 150–450)
RBC: 5.42 x10E6/uL — ABNORMAL HIGH (ref 3.77–5.28)
RDW: 13.2 % (ref 11.7–15.4)
WBC: 7.5 x10E3/uL (ref 3.4–10.8)

## 2024-06-07 LAB — ENHANCED LIVER FIBROSIS (ELF): ELF(TM) Score: 9.14 (ref <9.80)

## 2024-06-07 LAB — HEPATITIS C ANTIBODY: Hep C Virus Ab: NONREACTIVE

## 2024-06-07 LAB — PROTIME-INR
INR: 1 (ref 0.9–1.2)
Prothrombin Time: 10.5 s (ref 9.1–12.0)

## 2024-06-07 LAB — HEPATITIS A ANTIBODY, TOTAL: hep A Total Ab: POSITIVE — AB

## 2024-06-07 LAB — ANTI-SMOOTH MUSCLE ANTIBODY, IGG: Smooth Muscle Ab: 19 U (ref 0–19)

## 2024-06-07 LAB — HEPATITIS B SURFACE ANTIBODY,QUALITATIVE: Hep B Surface Ab, Qual: NONREACTIVE

## 2024-06-07 LAB — MITOCHONDRIAL ANTIBODIES: Mitochondrial Ab: <20 U (ref 0.0–20.0)

## 2024-06-07 LAB — HEPATITIS B SURFACE ANTIGEN: Hepatitis B Surface Ag: NEGATIVE

## 2024-06-07 LAB — HEPATITIS B CORE ANTIBODY, TOTAL: Hep B Core Total Ab: NEGATIVE

## 2024-06-07 LAB — ANA W/REFLEX IF POSITIVE: Anti Nuclear Antibody (ANA): NEGATIVE

## 2024-06-10 ENCOUNTER — Ambulatory Visit: Payer: Self-pay | Admitting: Gastroenterology

## 2024-06-23 ENCOUNTER — Other Ambulatory Visit: Payer: Self-pay | Admitting: Nurse Practitioner

## 2024-06-23 DIAGNOSIS — Z794 Long term (current) use of insulin: Secondary | ICD-10-CM

## 2024-06-24 ENCOUNTER — Other Ambulatory Visit: Payer: Self-pay | Admitting: Medical Genetics

## 2024-06-24 DIAGNOSIS — Z006 Encounter for examination for normal comparison and control in clinical research program: Secondary | ICD-10-CM

## 2024-06-28 ENCOUNTER — Encounter: Payer: Self-pay | Admitting: Radiology

## 2024-07-14 NOTE — Progress Notes (Signed)
 Suzanne Lawrence                                          MRN: [id number]   07/14/2024   The [facility] Quality Team Specialist reviewed this patient medical record for the purposes of chart review for care gap closure. The following were reviewed: chart review for care gap closure-glycemic status assessment.    [facility] Quality Team

## 2024-07-17 ENCOUNTER — Other Ambulatory Visit: Payer: Self-pay | Admitting: Nurse Practitioner

## 2024-07-17 DIAGNOSIS — E1165 Type 2 diabetes mellitus with hyperglycemia: Secondary | ICD-10-CM

## 2024-07-19 LAB — LAB REPORT - SCANNED
A1c: 7.9
EGFR: 78.6

## 2024-07-21 ENCOUNTER — Encounter: Payer: Self-pay | Admitting: Nurse Practitioner

## 2024-07-21 ENCOUNTER — Ambulatory Visit (INDEPENDENT_AMBULATORY_CARE_PROVIDER_SITE_OTHER): Admitting: Nurse Practitioner

## 2024-07-21 VITALS — BP 112/70 | HR 72 | Ht 61.0 in | Wt 183.8 lb

## 2024-07-21 DIAGNOSIS — Z794 Long term (current) use of insulin: Secondary | ICD-10-CM

## 2024-07-21 DIAGNOSIS — Z7985 Long-term (current) use of injectable non-insulin antidiabetic drugs: Secondary | ICD-10-CM

## 2024-07-21 DIAGNOSIS — E1165 Type 2 diabetes mellitus with hyperglycemia: Secondary | ICD-10-CM | POA: Diagnosis not present

## 2024-07-21 DIAGNOSIS — I10 Essential (primary) hypertension: Secondary | ICD-10-CM

## 2024-07-21 MED ORDER — LANTUS SOLOSTAR 100 UNIT/ML ~~LOC~~ SOPN: 40.0000 [IU] | PEN_INJECTOR | Freq: Every day | SUBCUTANEOUS | 1 refills | Status: AC

## 2024-07-21 MED ORDER — INSULIN LISPRO (1 UNIT DIAL) 100 UNIT/ML (KWIKPEN)
5.0000 [IU] | PEN_INJECTOR | Freq: Three times a day (TID) | SUBCUTANEOUS | 1 refills | Status: AC
Start: 2024-07-21 — End: ?

## 2024-07-21 MED ORDER — OZEMPIC (2 MG/DOSE) 8 MG/3ML ~~LOC~~ SOPN: 2.0000 mg | PEN_INJECTOR | SUBCUTANEOUS | 1 refills | Status: AC

## 2024-07-21 MED ORDER — PEN NEEDLES 31G X 6 MM MISC: 6 refills | Status: AC

## 2024-07-21 MED ORDER — DEXCOM G7 SENSOR MISC: 3 refills | Status: AC

## 2024-07-21 NOTE — Progress Notes (Signed)
 Endocrinology Follow Up Note       07/21/2024, 4:06 PM   Subjective:    Patient ID: Suzanne Lawrence, female    DOB: 1971/01/20.  Channel Papandrea is being seen in follow up after being seen in consultation for management of currently uncontrolled symptomatic diabetes requested by  Trudy Vaughn FALCON, MD.   Past Medical History:  Diagnosis Date   Abnormal Pap smear of cervix    Anemia    issue with red blood cells being small   Anxiety    Arthritis    Carpal tunnel syndrome    right   Depression    Diabetes mellitus without complication (HCC)    DVT (deep venous thrombosis) (HCC) 03/2021   left arm   GERD (gastroesophageal reflux disease)    High blood pressure    History of hiatal hernia    Left breast mass    Pre-diabetes     Past Surgical History:  Procedure Laterality Date   BIOPSY  02/05/2022   Procedure: BIOPSY;  Surgeon: Cindie Carlin POUR, DO;  Location: [facility];  Service: Endoscopy;;   COLONOSCOPY  2022   Megan Straughan: 7 polyps removed, has to return in one year   COLONOSCOPY  09/2021   Megan Straughan: 12 polyps removed   ESOPHAGOGASTRODUODENOSCOPY (EGD) WITH PROPOFOL  N/A 02/05/2022   Procedure: ESOPHAGOGASTRODUODENOSCOPY (EGD) WITH PROPOFOL ;  Surgeon: Cindie Carlin POUR, DO;  Location: [facility];  Service: Endoscopy;  Laterality: N/A;  2:00pm   KNEE ARTHROSCOPY Right 10/28/2022   Procedure: RIGHT KNEE ARTHROSCOPY, DEBRIDEMENT;  Surgeon: Addie Cordella Hamilton, MD;  Location: [facility];  Service: Orthopedics;  Laterality: Right;   left shoulder arthroscopy  12/2020   SHOULDER ARTHROSCOPY WITH OPEN ROTATOR CUFF REPAIR AND DISTAL CLAVICLE ACROMINECTOMY Left 03/21/2022   Procedure: LEFT SHOULDER ARTHOSCOPY, DEBRIDEMENT,  MANIPULATION, ROTATOR CUFF REPAIR, ARTHROSCOPIC DISTAL CLAVICLE EXCISION;  Surgeon: Addie Cordella Hamilton, MD;  Location: [facility];  Service: Orthopedics;   Laterality: Left;   TUBAL LIGATION      Social History   Socioeconomic History   Marital status: Single    Spouse name: Not on file   Number of children: 3   Years of education: Not on file   Highest education level: Some college, no degree  Occupational History   Not on file  Tobacco Use   Smoking status: Every Day    Current packs/day: 0.50    Types: Cigarettes   Smokeless tobacco: Never   Tobacco comments:    Working on cutting down on smoking. Down to 8 cigarrettes a day now.  Vaping Use   Vaping status: Never Used  Substance and Sexual Activity   Alcohol use: Not Currently   Drug use: Not Currently   Sexual activity: Yes    Birth control/protection: None  Other Topics Concern   Not on file  Social History Narrative   Lives at home with 2 daughters   Right handed   Caffeine: about 2-3 per day    Social Drivers of Health   Financial Resource Strain: Low Risk (02/05/2024)   Received from [facility]   Overall Financial Resource Strain (CARDIA)    How hard  is it for you to pay for the very basics like food, housing, medical care, and heating?: Not hard at all  Food Insecurity: No Food Insecurity (03/22/2024)   Hunger Vital Sign    Worried About Running Out of Food in the Last Year: Never true    Ran Out of Food in the Last Year: Never true  Recent Concern: Food Insecurity - Food Insecurity Present (02/05/2024)   Received from [facility]   Hunger Vital Sign    Within the past 12 months, you worried that your food would run out before you got the money to buy more.: Sometimes true    Within the past 12 months, the food you bought just didn't last and you didn't have money to get more.: Sometimes true  Transportation Needs: No Transportation Needs (03/22/2024)   PRAPARE - Administrator, Civil Service (Medical): No    Lack of Transportation (Non-Medical): No  Physical Activity: Insufficiently Active (02/10/2024)   Received from [facility]    Exercise Vital Sign    On average, how many minutes do you engage in exercise at this level?: 30 min    On average, how many days per week do you engage in moderate to strenuous exercise (like a brisk walk)?: 3 days  Stress: No Stress Concern Present (02/10/2024)   Received from [facility] - Occupational Stress Questionnaire    Do you feel stress - tense, restless, nervous, or anxious, or unable to sleep at night because your mind is troubled all the time - these days?: Not at all  Social Connections: Moderately Isolated (10/25/2021)   Received from [facility]   Social Connection and Isolation Panel    In a typical week, how many times do you talk on the phone with family, friends, or neighbors?: More than three times a week    How often do you get together with friends or relatives?: Never    How often do you attend church or religious services?: More than 4 times per year    Do you belong to any clubs or organizations such as church groups, unions, fraternal or athletic groups, or school groups?: No    How often do you attend meetings of the clubs or organizations you belong to?: Never    Are you married, widowed, divorced, separated, never married, or living with a partner?: Never married    Family History  Problem Relation Age of Onset   Lung cancer Mother    Other Father        multiple issues, doesn't know history for sure   Prostate cancer Father    Stroke Father    Aneurysm Paternal Grandmother    Breast cancer Maternal Aunt    Prostate cancer Maternal Uncle    Alcoholism Maternal Uncle    Aneurysm Cousin        paternal side   Aneurysm Cousin        paternal side    Breast cancer Cousin        maternal side    High blood pressure Other        father's side    Cerebral aneurysm Other        cerebral, on father's side    Alcoholism Other        maternal side    Colon cancer Neg Hx     Outpatient Encounter Medications  as of 07/21/2024  Medication  Sig   ACCU-CHEK GUIDE test strip Use 1 each to check blood glucose four times daily   Accu-Chek Softclix Lancets lancets 1 each by Other route 4 (four) times daily.   acetaminophen  (TYLENOL ) 325 MG tablet Take 650 mg by mouth every 6 (six) hours as needed for moderate pain.   ARIPiprazole  (ABILIFY ) 5 MG tablet Take 5 mg by mouth daily.   atenolol  (TENORMIN ) 25 MG tablet Take 1 tablet (25 mg total) by mouth daily.   atorvastatin  (LIPITOR) 10 MG tablet Take 10 mg by mouth daily.   Blood Glucose Monitoring Suppl (ACCU-CHEK GUIDE ME) w/Device KIT See admin instructions.   chlorthalidone (HYGROTON) 25 MG tablet Take 25 mg by mouth every evening.   DULoxetine  (CYMBALTA ) 30 MG capsule Take 30 mg by mouth daily.   ferrous sulfate  325 (65 FE) MG EC tablet Take 1 tablet (325 mg total) by mouth daily with breakfast.   furosemide  (LASIX ) 20 MG tablet Take 1 tablet (20 mg total) by mouth daily as needed.   gabapentin  (NEURONTIN ) 300 MG capsule Take 300 mg by mouth in the morning.   lisinopril  (ZESTRIL ) 5 MG tablet Take 1 tablet (5 mg total) by mouth daily.   meclizine (ANTIVERT) 25 MG tablet Take 25 mg by mouth 3 (three) times daily as needed for dizziness.   pantoprazole  (PROTONIX ) 40 MG tablet Take 1 tablet (40 mg total) by mouth 2 (two) times daily before a meal.   Semaglutide , 2 MG/DOSE, (OZEMPIC , 2 MG/DOSE,) 8 MG/3ML SOPN Inject 2 mg into the skin once a week.   spironolactone  (ALDACTONE ) 25 MG tablet Take 12.5 mg by mouth daily.   traZODone  (DESYREL ) 100 MG tablet Take 100 mg by mouth in the morning. Third shift worker 6pm-6am (Patient taking differently: Take 150 mg by mouth at bedtime.)   [DISCONTINUED] Continuous Glucose Sensor (DEXCOM G7 SENSOR) MISC USE TO CONTINUOUSLY MONITOR GLUCOSE AND CHANGE SENSOR EVERY 10 DAYS   [DISCONTINUED] insulin  glargine (LANTUS  SOLOSTAR) 100 UNIT/ML Solostar Pen Inject 60 Units into the skin at bedtime. (Patient taking differently: Inject  57 Units into the skin at bedtime.)   [DISCONTINUED] insulin  lispro (HUMALOG ) 100 UNIT/ML KwikPen INJECT 10 TO 16 UNITS SUBCUTANEOUSLY THREE TIMES DAILY WITH MEALS   [DISCONTINUED] Insulin  Pen Needle (PEN NEEDLES) 31G X 6 MM MISC Use to inject insulin  4 times daily   [DISCONTINUED] Semaglutide , 1 MG/DOSE, 4 MG/3ML SOPN Inject 1 mg as directed once a week.   Continuous Glucose Sensor (DEXCOM G7 SENSOR) MISC USE TO CONTINUOUSLY MONITOR GLUCOSE AND CHANGE SENSOR EVERY 10 DAYS   insulin  glargine (LANTUS  SOLOSTAR) 100 UNIT/ML Solostar Pen Inject 40 Units into the skin at bedtime.   insulin  lispro (HUMALOG ) 100 UNIT/ML KwikPen Inject 5-11 Units into the skin 3 (three) times daily.   Insulin  Pen Needle (PEN NEEDLES) 31G X 6 MM MISC Use to inject insulin  4 times daily   [DISCONTINUED] ARIPiprazole  (ABILIFY ) 10 MG tablet Take 10 mg by mouth daily.   [DISCONTINUED] baclofen  (LIORESAL ) 10 MG tablet Take 10 mg by mouth 3 (three) times daily as needed (Back pain).   [DISCONTINUED] insulin  lispro (HUMALOG  KWIKPEN) 100 UNIT/ML KwikPen Inject 10-16 Units into the skin 3 (three) times daily with meals.   No facility-administered encounter medications on file as of 07/21/2024.    ALLERGIES: Allergies  Allergen Reactions   Ferumoxytol  Other (See Comments)    Hot flash, nausea, chest pressure    Hydrochlorothiazide Other (See Comments)     urinary frequency; was going every  20 minutes and was causing problems at work    VACCINATION STATUS: Immunization History  Administered Date(s) Administered   Moderna Sars-Covid-2 Vaccination 07/06/2020    Diabetes She presents for her follow-up diabetic visit. She has type 2 diabetes mellitus. Onset time: diagnosed at approx age of 51 (in June) Her disease course has been improving. There are no hypoglycemic associated symptoms. Associated symptoms include fatigue. Pertinent negatives for diabetes include no blurred vision, no polydipsia, no polyuria, no visual  change and no weight loss. There are no hypoglycemic complications. Symptoms are improving. There are no diabetic complications. Risk factors for coronary artery disease include diabetes mellitus, dyslipidemia, family history, obesity, hypertension, sedentary lifestyle and tobacco exposure. Current diabetic treatment includes intensive insulin  program (and Ozempic ). She is compliant with treatment most of the time. Her weight is fluctuating minimally. She is following a generally healthy diet. When asked about meal planning, she reported none. She has had a previous visit with a dietitian. She rarely participates in exercise. Her home blood glucose trend is decreasing steadily. Her breakfast blood glucose range is generally 110-130 mg/dl. Her lunch blood glucose range is generally 110-130 mg/dl. Her dinner blood glucose range is generally 110-130 mg/dl. Her bedtime blood glucose range is generally 110-130 mg/dl. Her overall blood glucose range is 110-130 mg/dl. (She presents today with her CGM showing at goal glycemic profile overall.  Her A1c today was unable to be run due to machine malfunction.  Analysis of her CGM shows TIR 93%, TAR 7%, TBR <1% with a GMI of 6.4%.  She continues to tolerate the Ozempic  well, no unpleasant side effects.  She has had several drops in glucose.) An ACE inhibitor/angiotensin II receptor blocker is being taken. She sees a podiatrist.Eye exam is not current.    Review of systems  Constitutional: + Minimally fluctuating body weight,  current Body mass index is 34.73 kg/m. , no fatigue, no subjective hyperthermia, no subjective hypothermia Eyes: no blurry vision, no xerophthalmia ENT: no sore throat, no nodules palpated in throat, no dysphagia/odynophagia, no hoarseness Cardiovascular: no chest pain, no shortness of breath, no palpitations, no leg swelling Respiratory: no cough, no shortness of breath Gastrointestinal: no nausea/vomiting/diarrhea Musculoskeletal: no  muscle/joint aches Skin: no rashes, no hyperemia Neurological: no tremors, no numbness, no tingling, no dizziness Psychiatric: no depression, no anxiety  Objective:     BP 112/70 (BP Location: Left Arm, Patient Position: Sitting, Cuff Size: Large)   Pulse 72   Ht 5' 1 (1.549 m)   Wt 183 lb 12.8 oz (83.4 kg)   LMP 05/01/2021 (Approximate)   BMI 34.73 kg/m   Wt Readings from Last 3 Encounters:  07/21/24 183 lb 12.8 oz (83.4 kg)  05/18/24 182 lb 1.6 oz (82.6 kg)  04/21/24 183 lb (83 kg)     BP Readings from Last 3 Encounters:  07/21/24 112/70  05/18/24 106/65  04/16/24 106/72      Physical Exam- Limited  Constitutional:  Body mass index is 34.73 kg/m. , not in acute distress, normal state of mind Eyes:  EOMI, no exophthalmos Musculoskeletal: no gross deformities, strength intact in all four extremities, no gross restriction of joint movements Skin:  no rashes, no hyperemia Neurological: no tremor with outstretched hands   Diabetic Foot Exam - Simple   No data filed      CMP ( most recent) CMP     Component Value Date/Time   NA 144 06/02/2024 1111   K 4.1 06/02/2024 1111   CL 105  06/02/2024 1111   CO2 23 06/02/2024 1111   GLUCOSE 88 06/02/2024 1111   GLUCOSE 306 (H) 03/23/2024 0258   BUN 9 06/02/2024 1111   CREATININE 0.97 06/02/2024 1111   CALCIUM  9.4 06/02/2024 1111   PROT 6.7 06/02/2024 1111   ALBUMIN 4.1 06/02/2024 1111   AST 29 06/02/2024 1111   ALT 48 (H) 06/02/2024 1111   ALKPHOS 100 06/02/2024 1111   BILITOT 0.3 06/02/2024 1111   GFRNONAA >60 03/23/2024 0258     Diabetic Labs (most recent): Lab Results  Component Value Date   HGBA1C 13.1 (H) 03/22/2024   HGBA1C 7.4 (A) 12/25/2022   HGBA1C 12.6 (A) 09/02/2022   MICROALBUR 10 mg/L 09/30/2022     Lipid Panel ( most recent) Lipid Panel     Component Value Date/Time   CHOL 161 06/02/2024 1111   TRIG 138 06/02/2024 1111      Lab Results  Component Value Date   TSH 3.600 03/17/2023    FREET4 0.93 03/17/2023         Assessment & Plan:   1) Type 2 diabetes mellitus with hyperglycemia, with long-term current use of insulin  (HCC)  She presents today with her CGM showing at goal glycemic profile overall.  Her A1c today was unable to be run due to machine malfunction.  Analysis of her CGM shows TIR 93%, TAR 7%, TBR <1% with a GMI of 6.4%.  She continues to tolerate the Ozempic  well, no unpleasant side effects.  She has had several drops in glucose.  - Sidnie Swalley has currently uncontrolled symptomatic type 2 DM since 53 years of age.   -Recent labs reviewed.  Recent LFTs were elevated, seeing GI for this.  - I had a long discussion with her about the progressive nature of diabetes and the pathology behind its complications. -her diabetes is not currently complicated but she remains at a high risk for more acute and chronic complications which include CAD, CVA, CKD, retinopathy, and neuropathy. These are all discussed in detail with her.  The following Lifestyle Medicine recommendations according to American College of Lifestyle Medicine [facility]) were discussed and offered to patient and she agrees to start the journey:  A. Whole Foods, Plant-based plate comprising of fruits and vegetables, plant-based proteins, whole-grain carbohydrates was discussed in detail with the patient.   A list for source of those nutrients were also provided to the patient.  Patient will use only water or unsweetened tea for hydration. B.  The need to stay away from risky substances including alcohol, smoking; obtaining 7 to 9 hours of restorative sleep, at least 150 minutes of moderate intensity exercise weekly, the importance of healthy social connections,  and stress reduction techniques were discussed. C.  A full color page of  Calorie density of various food groups per pound showing examples of each food groups was provided to the patient.  - Nutritional counseling repeated/built upon at each  appointment.  - The patient admits there is a room for improvement in their diet and drink choices. -  Suggestion is made for the patient to avoid simple carbohydrates from their diet including Cakes, Sweet Desserts / Pastries, Ice Cream, Soda (diet and regular), Sweet Tea, Candies, Chips, Cookies, Sweet Pastries, Store Bought Juices, Alcohol in Excess of 1-2 drinks a day, Artificial Sweeteners, Coffee Creamer, and Sugar-free Products. This will help patient to have stable blood glucose profile and potentially avoid unintended weight gain.   - I encouraged the patient to switch to unprocessed or  minimally processed complex starch and increased protein intake (animal or plant source), fruits, and vegetables.   - Patient is advised to stick to a routine mealtimes to eat 3 meals a day and avoid unnecessary snacks (to snack only to correct hypoglycemia).  - I have approached her with the following individualized plan to manage her diabetes and patient agrees:   She is advised to adjust her Lantus  to 40 units SQ nightly and adjust her Novolog  to 5-11 units TID.  Will increase the Ozempic  to 2 mg SQ weekly.  -she is encouraged to continue using CGM to monitor glucose 4 times daily, before meals and before bed, and to call the clinic if she has readings less than 70 or above 300 for 3 tests in a row.    - she is warned not to take insulin  without proper monitoring per orders. - Adjustment parameters are given to her for hypo and hyperglycemia in writing.  - Specific targets for  A1c; LDL, HDL, and Triglycerides were discussed with the patient.  2) Blood Pressure /Hypertension:  her blood pressure is controlled to target.   she is advised to continue her current medications as prescribed by her PCP.  3) Lipids/Hyperlipidemia:    Her recent lipid panel from 02/28/23 shows controlled LDL of 99 and elevated triglycerides of 323.  She is advised to continue Lipitor 10 mg po daily- currently managed by  her PCP.    4)  Weight/Diet:  her Body mass index is 34.73 kg/m.  -  clearly complicating her diabetes care.   she is a candidate for weight loss. I discussed with her the fact that loss of 5 - 10% of her  current body weight will have the most impact on her diabetes management.  Exercise, and detailed carbohydrates information provided  -  detailed on discharge instructions.  5) Chronic Care/Health Maintenance: -she is on ACEI/ARB and not on Statin medications and is encouraged to initiate and continue to follow up with Ophthalmology, Dentist, Podiatrist at least yearly or according to recommendations, and advised to [facility] working towards this. I have recommended yearly flu vaccine and pneumonia vaccine at least every 5 years; moderate intensity exercise for up to 150 minutes weekly; and sleep for at least 7 hours a day.  - she is advised to maintain close follow up with Trudy Vaughn FALCON, MD for primary care needs, as well as her other providers for optimal and coordinated care.     I spent  26  minutes in the care of the patient today including review of labs from CMP, Lipids, Thyroid Function, Hematology (current and previous including abstractions from other facilities); face-to-face time discussing  her blood glucose readings/logs, discussing hypoglycemia and hyperglycemia episodes and symptoms, medications doses, her options of short and long term treatment based on the latest standards of care / guidelines;  discussion about incorporating lifestyle medicine;  and documenting the encounter. Risk reduction counseling performed per USPSTF guidelines to reduce obesity and cardiovascular risk factors.     Please refer to Patient Instructions for Blood Glucose Monitoring and Insulin /Medications Dosing Guide  in media tab for additional information. Please  also refer to  Patient Self Inventory in the Media  tab for reviewed elements of pertinent patient history.  Delon Lunger  participated in the discussions, expressed understanding, and voiced agreement with the above plans.  All questions were answered to her satisfaction. she is encouraged to contact clinic should she have any questions or concerns prior  to her return visit.     Follow up plan: - Return in about 4 months (around 11/18/2024) for Diabetes F/U with A1c in office, No previsit labs, Bring meter and logs.   [place]enton Rio, [facility] [address] [place], [place] [address] Phone: [phone] Fax: [phone]  07/21/2024, 4:06 PM

## 2024-07-27 ENCOUNTER — Encounter: Payer: Self-pay | Admitting: Nutrition

## 2024-07-27 ENCOUNTER — Encounter: Attending: Internal Medicine | Admitting: Nutrition

## 2024-07-27 VITALS — Ht 61.0 in | Wt 184.0 lb

## 2024-07-27 DIAGNOSIS — E66812 Obesity, class 2: Secondary | ICD-10-CM | POA: Insufficient documentation

## 2024-07-27 DIAGNOSIS — I10 Essential (primary) hypertension: Secondary | ICD-10-CM | POA: Diagnosis present

## 2024-07-27 DIAGNOSIS — E118 Type 2 diabetes mellitus with unspecified complications: Secondary | ICD-10-CM | POA: Insufficient documentation

## 2024-07-27 NOTE — Patient Instructions (Signed)
 Exercise twice a week. Cut back on bacon and sausage Increase  raisin bran for breakfast Keep up the good job!

## 2024-07-27 NOTE — Progress Notes (Signed)
 Medical Nutrition Therapy  Appointment Start time:  1500  Appointment End time:  1515 Primary concerns today: DM Type 2 Obesity  Referral diagnosis: E11.8, DE66.9 Preferred learning style: No preference  Learning readiness: Ready, change in progress    NUTRITION ASSESSMENT Folllow up  She is back on schedule taking her insulin  as prescribed.. Lantus  60 units at night and Humalog  10-16 units with meals. Ozempic  weekly. CGM GMI 6.4%. Avg Glu 130 mg/dl. 93% TIR, 7% TAR. No low blood sugars. 40 units of Lantus  and 5-11 units of Lispro with meals. Smoking 1/2 pack per day. Not interested in quitting smoking. Sees a therapist. Trying to eat more fruits and vegetables. Has cut back on eating fast food and junk food and sweets. Eating 3 meals per day. Diet is high in processed meats of pork and beef- bacon, sausage and beef tips. Goals set previously: Take insulin  as prescribed and use sliding scale as prescribed.-done Try not to eat after 8 pm.done Get back to exercising 2 times per week.- needs to do it. Try the motion sensor lights in bedroom to help with better sleep.-hasn't gotten one yet Create a bedtime routine.- working on it.     She is willing to work on making better food choices and committing to lifestyle changes.  Clinical Wt Readings from Last 3 Encounters:  07/21/24 183 lb 12.8 oz (83.4 kg)  05/18/24 182 lb 1.6 oz (82.6 kg)  04/21/24 183 lb (83 kg)   Ht Readings from Last 3 Encounters:  07/21/24 5' 1 (1.549 m)  05/18/24 5' 1 (1.549 m)  04/21/24 5' 1 (1.549 m)   There is no height or weight on file to calculate BMI. @BMIFA @ Facility age limit for growth %iles is 20 years. Facility age limit for growth %iles is 20 years.  Medical Hx:  Past Medical History:  Diagnosis Date   Abnormal Pap smear of cervix    Anemia    issue with red blood cells being small   Anxiety    Arthritis    Carpal tunnel syndrome    right   Depression    Diabetes mellitus without  complication (HCC)    DVT (deep venous thrombosis) (HCC) 03/2021   left arm   GERD (gastroesophageal reflux disease)    High blood pressure    History of hiatal hernia    Left breast mass    Pre-diabetes     Medications:  Current Outpatient Medications on File Prior to Visit  Medication Sig Dispense Refill   ACCU-CHEK GUIDE test strip Use 1 each to check blood glucose four times daily 300 each 1   Accu-Chek Softclix Lancets lancets 1 each by Other route 4 (four) times daily. 300 each 1   acetaminophen  (TYLENOL ) 325 MG tablet Take 650 mg by mouth every 6 (six) hours as needed for moderate pain.     ARIPiprazole  (ABILIFY ) 5 MG tablet Take 5 mg by mouth daily.     atenolol  (TENORMIN ) 25 MG tablet Take 1 tablet (25 mg total) by mouth daily. 30 tablet 2   atorvastatin  (LIPITOR) 10 MG tablet Take 10 mg by mouth daily.     Blood Glucose Monitoring Suppl (ACCU-CHEK GUIDE ME) w/Device KIT See admin instructions.     chlorthalidone (HYGROTON) 25 MG tablet Take 25 mg by mouth every evening.     Continuous Glucose Sensor (DEXCOM G7 SENSOR) MISC USE TO CONTINUOUSLY MONITOR GLUCOSE AND CHANGE SENSOR EVERY 10 DAYS 9 each 3   DULoxetine  (CYMBALTA ) 30 MG  capsule Take 30 mg by mouth daily.     ferrous sulfate  325 (65 FE) MG EC tablet Take 1 tablet (325 mg total) by mouth daily with breakfast. 90 tablet 3   furosemide  (LASIX ) 20 MG tablet Take 1 tablet (20 mg total) by mouth daily as needed. 30 tablet 2   gabapentin  (NEURONTIN ) 300 MG capsule Take 300 mg by mouth in the morning.     insulin  glargine (LANTUS  SOLOSTAR) 100 UNIT/ML Solostar Pen Inject 40 Units into the skin at bedtime. 36 mL 1   insulin  lispro (HUMALOG ) 100 UNIT/ML KwikPen Inject 5-11 Units into the skin 3 (three) times daily. 30 mL 1   Insulin  Pen Needle (PEN NEEDLES) 31G X 6 MM MISC Use to inject insulin  4 times daily 300 each 6   lisinopril  (ZESTRIL ) 5 MG tablet Take 1 tablet (5 mg total) by mouth daily. 30 tablet 2   meclizine (ANTIVERT)  25 MG tablet Take 25 mg by mouth 3 (three) times daily as needed for dizziness.     pantoprazole  (PROTONIX ) 40 MG tablet Take 1 tablet (40 mg total) by mouth 2 (two) times daily before a meal. 180 tablet 3   Semaglutide , 2 MG/DOSE, (OZEMPIC , 2 MG/DOSE,) 8 MG/3ML SOPN Inject 2 mg into the skin once a week. 9 mL 1   spironolactone  (ALDACTONE ) 25 MG tablet Take 12.5 mg by mouth daily.     traZODone  (DESYREL ) 100 MG tablet Take 100 mg by mouth in the morning. Third shift worker 6pm-6am (Patient taking differently: Take 150 mg by mouth at bedtime.)     No current facility-administered medications on file prior to visit.    Labs:  Lab Results  Component Value Date   HGBA1C 13.1 (H) 03/22/2024   Decom est A1C 6.2%.  Notable Signs/Symptoms: None  Lifestyle & Dietary Hx LIves by herself.  Estimated daily fluid intake: 60 oz Supplements: Sleep: poor sleep- up and down. Is on trazadone Stress / self-care: anxiety and depression Current average weekly physical activity: chair exercises and will start [employer] twice a week.  24-Hr Dietary Recall B) Eggs, sausage and 2 strips of bacon , coffee- sugar and cream L) turnip green, green beans, mac/cheese and beef tips, water D) small salad-lettuce, pickles, tomatoes and [place].-water  Estimated Energy Needs Calories: 1200 Carbohydrate: 135g Protein: 90g Fat: 33g   NUTRITION DIAGNOSIS  NB-1.1 Food and nutrition-related knowledge deficit As related to Diabetes Type 2.  As evidenced by A1C 7.3%.   NUTRITION INTERVENTION  Nutrition education (E-1) on the following topics:  Nutrition and Diabetes education provided on My Plate, CHO counting, meal planning, portion sizes, timing of meals, avoiding snacks between meals unless having a low blood sugar, target ranges for A1C and blood sugars, signs/symptoms and treatment of hyper/hypoglycemia, monitoring blood sugars, taking medications as prescribed, benefits of exercising 30 minutes per day  and prevention of complications of DM.  Lifestyle Medicine  - Whole Food, Plant Predominant Nutrition is highly recommended: Eat Plenty of vegetables, Mushrooms, fruits, Legumes, Whole Grains, Nuts, seeds in lieu of processed meats, processed snacks/pastries red meat, poultry, eggs.    -It is better to avoid simple carbohydrates including: Cakes, Sweet Desserts, Ice Cream, Soda (diet and regular), Sweet Tea, Candies, Chips, Cookies, Store Bought Juices, Alcohol in Excess of  1-2 drinks a day, Lemonade,  Artificial Sweeteners, Doughnuts, Coffee Creamers, Sugar-free Products, etc, etc.  This is not a complete list.....  Exercise: If you are able: 30 -60 minutes a day ,4 days  a week, or 150 minutes a week.  The longer the better.  Combine stretch, strength, and aerobic activities.  If you were told in the past that you have high risk for cardiovascular diseases, you may seek evaluation by your heart doctor prior to initiating moderate to intense exercise programs.  MyFullplateliving and meal planning   Handouts Provided Include  Lifestyle Medicine Full Plate Living   Learning Style & Readiness for Change Teaching method utilized: Visual & Auditory  Demonstrated degree of understanding via: Teach Back  Barriers to learning/adherence to lifestyle change: anxiety and depression  Goals Established by Pt Exercise twice a week. Cut back on bacon and sausage Increase  raisin bran for breakfast Keep up the good job!  MONITORING & EVALUATION Dietary intake, weekly physical activity, and weight in 3 month.  Next Steps  Patient is to work on meal planning.SABRA

## 2024-08-17 ENCOUNTER — Encounter: Payer: Self-pay | Admitting: Gastroenterology

## 2024-09-09 ENCOUNTER — Encounter (INDEPENDENT_AMBULATORY_CARE_PROVIDER_SITE_OTHER): Payer: Self-pay | Admitting: *Deleted

## 2024-09-20 ENCOUNTER — Ambulatory Visit: Admitting: Gastroenterology

## 2024-09-21 NOTE — Progress Notes (Unsigned)
 Suzanne Lawrence

## 2024-09-21 NOTE — Progress Notes (Unsigned)
 "  Patient: Suzanne Lawrence Date of Birth: 01/08/1971  Reason for Visit: Follow up History from: Patient Primary Neurologist: Buck  Virtual Visit via Video Note  I connected with Suzanne Lawrence on 09/22/24 at  2:45 PM EST by a video enabled telemedicine application and verified that I am speaking with the correct person using two identifiers.  Location: Patient: at her home Provider: in the office    I discussed the limitations of evaluation and management by telemedicine and the availability of in person appointments. The patient expressed understanding and agreed to proceed.  ASSESSMENT AND PLAN 54 y.o. year old female   1.  OSA on CPAP (PSG 11/18/2023 showed moderate OSA.  Started CPAP 01/01/2024)  - Very good CPAP compliance and benefit, mask is leaking/restrictive, will order mask refit, consider nasal pillow mask  - Continue nightly usage minimum 4 hours - Continue current settings - Follow-up in 1 year virtually  HISTORY OF PRESENT ILLNESS: Today 09/22/24 09/22/24 SS: VV, CPAP report shows 87% compliance greater than 4 hours, 6-13 cmH2O, AHI 0.9, leak 37.4. Doesn't find CPAP use enjoyable, uncomfortable to have something over her face. Uses a nasal mask. Her mask is leaking some, she can't tighten it anymore, she rolls on her side and it leaks. Feels sleep quality is better, feels more rested. 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   02/11/24 SS: Saw Dr. Buck in Feb 2025 reporting snoring and daytime somnolence.  PSG 11/18/2023 showed moderate OSA.  Started CPAP 01/01/2024.  CPAP compliance shows 83% compliance greater than 4 hours.  6 to 13 cm water.  Leak 17.1.  AHI 1.3. Having hard time adjusting, the FFM caused her lips crack, using nasal pillow now, but feels is leaking. Feels more rested, more daytime energy. Used to be driving drowsy now feels alert, energized. Just started working, [employer] as a biochemist, clinical, works nightshift.  Pleased with CPAP, motivated  to continue use.  HISTORY  10/20/23 Dr. Buck: I saw your patient, Suzanne Lawrence, upon your kind request in my sleep clinic today for initial consultation of her sleep disorder, concern for underlying obstructive sleep apnea.  The patient is unaccompanied today.  As you know, Suzanne Lawrence is a 54 year old female with an underlying medical history of iron deficiency anemia, erythrocytosis, alpha thalassemia minor trait, anxiety, depression, history of DVT, smoking, reflux disease, and obesity, who reports snoring and excessive daytime somnolence.  Her Epworth sleepiness score is 19 out of 24, fatigue severity score is 57 out of 63.  She is on multiple medications including several potentially sedating medications which includes Abilify , gabapentin , baclofen , Cymbalta , meclizine and trazodone .  I reviewed your office note from 09/24/2023. She snores loudly, per family and her sister has mentioned pauses in her breathing while she is asleep.  She is single and lives with her 40 year old nephew.  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  She smokes about half a pack per day.  She is working on smoking cessation.  She has had weight gain over time but is working on weight loss currently and lost a few pounds already.  She has 3 grown children.  She has no pets in the household.  She does have a TV in the bedroom but most of the time it is all or goes off on a sleep timer if she does fall asleep with the TV on.  Bedtime is around 930.  She takes her trazodone  at 9 PM.  Rise time is around  5.  She does not have trouble falling asleep but has multiple nighttime awakenings.  She does not have any nightly nocturia but has occasionally woken up with a headache first thing in the morning.  She is not aware of any family history of sleep apnea.  She limits her caffeine to 1 cup of coffee in the morning.  She drinks soda rarely.  She does not drink any alcohol.   REVIEW OF SYSTEMS: Out of a complete 14  system review of symptoms, the patient complains only of the following symptoms, and all other reviewed systems are negative.  See HPI  ALLERGIES: Allergies  Allergen Reactions   Ferumoxytol  Other (See Comments)    Hot flash, nausea, chest pressure    Hydrochlorothiazide Other (See Comments)     urinary frequency; was going every 20 minutes and was causing problems at work    HOME MEDICATIONS: Outpatient Medications Prior to Visit  Medication Sig Dispense Refill   ACCU-CHEK GUIDE test strip Use 1 each to check blood glucose four times daily 300 each 1   Accu-Chek Softclix Lancets lancets 1 each by Other route 4 (four) times daily. 300 each 1   acetaminophen  (TYLENOL ) 325 MG tablet Take 650 mg by mouth every 6 (six) hours as needed for moderate pain.     ARIPiprazole  (ABILIFY ) 5 MG tablet Take 5 mg by mouth daily.     atenolol  (TENORMIN ) 25 MG tablet Take 1 tablet (25 mg total) by mouth daily. 30 tablet 2   atorvastatin  (LIPITOR) 10 MG tablet Take 10 mg by mouth daily.     Blood Glucose Monitoring Suppl (ACCU-CHEK GUIDE ME) w/Device KIT See admin instructions.     chlorthalidone (HYGROTON) 25 MG tablet Take 25 mg by mouth every evening.     Continuous Glucose Sensor (DEXCOM G7 SENSOR) MISC USE TO CONTINUOUSLY MONITOR GLUCOSE AND CHANGE SENSOR EVERY 10 DAYS 9 each 3   DULoxetine  (CYMBALTA ) 30 MG capsule Take 30 mg by mouth daily.     ferrous sulfate  325 (65 FE) MG EC tablet Take 1 tablet (325 mg total) by mouth daily with breakfast. 90 tablet 3   furosemide  (LASIX ) 20 MG tablet Take 1 tablet (20 mg total) by mouth daily as needed. 30 tablet 2   gabapentin  (NEURONTIN ) 300 MG capsule Take 300 mg by mouth in the morning.     insulin  glargine (LANTUS  SOLOSTAR) 100 UNIT/ML Solostar Pen Inject 40 Units into the skin at bedtime. 36 mL 1   insulin  lispro (HUMALOG ) 100 UNIT/ML KwikPen Inject 5-11 Units into the skin 3 (three) times daily. 30 mL 1   Insulin  Pen Needle (PEN NEEDLES) 31G X 6 MM  MISC Use to inject insulin  4 times daily 300 each 6   lisinopril  (ZESTRIL ) 5 MG tablet Take 1 tablet (5 mg total) by mouth daily. 30 tablet 2   meclizine (ANTIVERT) 25 MG tablet Take 25 mg by mouth 3 (three) times daily as needed for dizziness.     pantoprazole  (PROTONIX ) 40 MG tablet Take 1 tablet (40 mg total) by mouth 2 (two) times daily before a meal. 180 tablet 3   Semaglutide , 2 MG/DOSE, (OZEMPIC , 2 MG/DOSE,) 8 MG/3ML SOPN Inject 2 mg into the skin once a week. 9 mL 1   spironolactone  (ALDACTONE ) 25 MG tablet Take 12.5 mg by mouth daily.     traZODone  (DESYREL ) 100 MG tablet Take 100 mg by mouth in the morning. Third shift worker 6pm-6am (Patient taking differently: Take 150 mg by  mouth at bedtime.)     No facility-administered medications prior to visit.    PAST MEDICAL HISTORY: Past Medical History:  Diagnosis Date   Abnormal Pap smear of cervix    Anemia    issue with red blood cells being small   Anxiety    Arthritis    Carpal tunnel syndrome    right   Depression    Diabetes mellitus without complication (HCC)    DVT (deep venous thrombosis) (HCC) 03/2021   left arm   GERD (gastroesophageal reflux disease)    High blood pressure    History of hiatal hernia    Left breast mass    Pre-diabetes     PAST SURGICAL HISTORY: Past Surgical History:  Procedure Laterality Date   BIOPSY  02/05/2022   Procedure: BIOPSY;  Surgeon: Cindie Carlin POUR, DO;  Location: [facility];  Service: Endoscopy;;   COLONOSCOPY  2022   Megan Straughan: 7 polyps removed, has to return in one year   COLONOSCOPY  09/2021   Megan Straughan: 12 polyps removed   ESOPHAGOGASTRODUODENOSCOPY (EGD) WITH PROPOFOL  N/A 02/05/2022   Procedure: ESOPHAGOGASTRODUODENOSCOPY (EGD) WITH PROPOFOL ;  Surgeon: Cindie Carlin POUR, DO;  Location: [facility];  Service: Endoscopy;  Laterality: N/A;  2:00pm   KNEE ARTHROSCOPY Right 10/28/2022   Procedure: RIGHT KNEE ARTHROSCOPY, DEBRIDEMENT;  Surgeon: Addie Cordella Hamilton, MD;  Location: [facility];  Service: Orthopedics;  Laterality: Right;   left shoulder arthroscopy  12/2020   SHOULDER ARTHROSCOPY WITH OPEN ROTATOR CUFF REPAIR AND DISTAL CLAVICLE ACROMINECTOMY Left 03/21/2022   Procedure: LEFT SHOULDER ARTHOSCOPY, DEBRIDEMENT,  MANIPULATION, ROTATOR CUFF REPAIR, ARTHROSCOPIC DISTAL CLAVICLE EXCISION;  Surgeon: Addie Cordella Hamilton, MD;  Location: [facility];  Service: Orthopedics;  Laterality: Left;   TUBAL LIGATION      FAMILY HISTORY: Family History  Problem Relation Age of Onset   Lung cancer Mother    Other Father        multiple issues, doesn't know history for sure   Prostate cancer Father    Stroke Father    Aneurysm Paternal Grandmother    Breast cancer Maternal Aunt    Prostate cancer Maternal Uncle    Alcoholism Maternal Uncle    Aneurysm Cousin        paternal side   Aneurysm Cousin        paternal side    Breast cancer Cousin        maternal side    High blood pressure Other        father's side    Cerebral aneurysm Other        cerebral, on father's side    Alcoholism Other        maternal side    Colon cancer Neg Hx     SOCIAL HISTORY: Social History   Socioeconomic History   Marital status: Single    Spouse name: Not on file   Number of children: 3   Years of education: Not on file   Highest education level: Some college, no degree  Occupational History   Not on file  Tobacco Use   Smoking status: Every Day    Current packs/day: 0.50    Types: Cigarettes   Smokeless tobacco: Never   Tobacco comments:    Working on cutting down on smoking. Down to 8 cigarrettes a day now.  Vaping Use   Vaping status: Never Used  Substance and Sexual Activity   Alcohol use: Not Currently  Drug use: Not Currently   Sexual activity: Yes    Birth control/protection: None  Other Topics Concern   Not on file  Social History Narrative   Lives at home with 2 daughters   Right handed   Caffeine: about 2-3 per day     Social Drivers of Health   Tobacco Use: High Risk (09/16/2024)   Received from [facility]   Patient History    Smoking Tobacco Use: Every Day    Smokeless Tobacco Use: Never    Passive Exposure: Current  Financial Resource Strain: Low Risk (08/10/2024)   Received from [facility]   Overall Financial Resource Strain (CARDIA)    How hard is it for you to pay for the very basics like food, housing, medical care, and heating?: Not hard at all  Food Insecurity: No Food Insecurity (08/10/2024)   Received from [facility]   Epic    Within the past 12 months, you worried that your food would run out before you got the money to buy more.: Never true    Within the past 12 months, the food you bought just didn't last and you didn't have money to get more.: Never true  Transportation Needs: No Transportation Needs (08/10/2024)   Received from [facility]    In the past 12 months, has lack of transportation kept you from medical appointments or from getting medications?: No    In the past 12 months, has lack of transportation kept you from meetings, work, or from getting things needed for daily living?: No  Physical Activity: Insufficiently Active (02/10/2024)   Received from [facility]   Exercise Vital Sign    On average, how many minutes do you engage in exercise at this level?: 30 min    On average, how many days per week do you engage in moderate to strenuous exercise (like a brisk walk)?: 3 days  Stress: No Stress Concern Present (02/10/2024)   Received from [facility] - Occupational Stress Questionnaire    Do you feel stress - tense, restless, nervous, or anxious, or unable to sleep at night because your mind is troubled all the time - these days?: Not at all  Social Connections: Moderately Isolated (10/25/2021)   Received from [facility]   Social Connection and Isolation Panel    In a typical week, how many times do  you talk on the phone with family, friends, or neighbors?: More than three times a week    How often do you get together with friends or relatives?: Never    How often do you attend church or religious services?: More than 4 times per year    Do you belong to any clubs or organizations such as church groups, unions, fraternal or athletic groups, or school groups?: No    How often do you attend meetings of the clubs or organizations you belong to?: Never    Are you married, widowed, divorced, separated, never married, or living with a partner?: Never married  Intimate Partner Violence: Not At Risk (03/22/2024)   Epic    Fear of Current or Ex-Partner: No    Emotionally Abused: No    Physically Abused: No    Sexually Abused: No  Depression (PHQ2-9): Low Risk (03/30/2024)   Depression (PHQ2-9)    PHQ-2 Score: 0  Alcohol Screen: Not on file  Housing: Low Risk (08/10/2024)   Received from [facility]  Epic    In the last 12 months, was there a time when you were not able to pay the mortgage or rent on time?: No    In the past 12 months, how many times have you moved where you were living?: 0    At any time in the past 12 months, were you homeless or living in a shelter (including now)?: No  Utilities: Not At Risk (08/10/2024)   Received from [facility]    In the past 12 months has the electric, gas, oil, or water company threatened to shut off services in your home?: No  Health Literacy: Low Risk (02/10/2024)   Received from [facility]    How often do you need to have someone help you when you read instructions, pamphlets, or other written material from your doctor or pharmacy?: Never   PHYSICAL EXAM  There were no vitals filed for this visit. There is no height or weight on file to calculate BMI.  Virtual visit  DIAGNOSTIC DATA (LABS, IMAGING, TESTING) - I reviewed patient records, labs, notes, testing and imaging myself where available.  Lab  Results  Component Value Date   WBC 7.5 06/02/2024   HGB 15.1 06/02/2024   HCT 45.9 06/02/2024   MCV 85 06/02/2024   PLT 345 06/02/2024      Component Value Date/Time   NA 144 06/02/2024 1111   K 4.1 06/02/2024 1111   CL 105 06/02/2024 1111   CO2 23 06/02/2024 1111   GLUCOSE 88 06/02/2024 1111   GLUCOSE 306 (H) 03/23/2024 0258   BUN 9 06/02/2024 1111   CREATININE 0.97 06/02/2024 1111   CALCIUM  9.4 06/02/2024 1111   PROT 6.7 06/02/2024 1111   ALBUMIN 4.1 06/02/2024 1111   AST 29 06/02/2024 1111   ALT 48 (H) 06/02/2024 1111   ALKPHOS 100 06/02/2024 1111   BILITOT 0.3 06/02/2024 1111   GFRNONAA >60 03/23/2024 0258   Lab Results  Component Value Date   CHOL 161 06/02/2024   TRIG 138 06/02/2024   Lab Results  Component Value Date   HGBA1C 13.1 (H) 03/22/2024   Lab Results  Component Value Date   VITAMINB12 935 12/09/2019   Lab Results  Component Value Date   TSH 3.600 03/17/2023   Lauraine Born, AGNP-C, DNP 09/22/2024, 2:52 PM [facility] [address] [place], [place] [address] [phone] "

## 2024-09-22 ENCOUNTER — Telehealth: Payer: Self-pay

## 2024-09-22 ENCOUNTER — Telehealth: Admitting: Neurology

## 2024-09-22 DIAGNOSIS — G4733 Obstructive sleep apnea (adult) (pediatric): Secondary | ICD-10-CM | POA: Diagnosis not present

## 2024-09-22 NOTE — Telephone Encounter (Signed)
-----   Message from Lauraine Born, NP sent at 09/22/2024  2:53 PM EST ----- Please send order to DME for mask refit

## 2024-09-22 NOTE — Patient Instructions (Signed)
 Great to see you today! Mask refit for CPAP Continue nightly usage minimum 4 hours Reach out if any issues Follow-up in 1 year virtually.  Thanks!!

## 2024-09-22 NOTE — Telephone Encounter (Signed)
 Order sent to dme

## 2024-09-23 NOTE — Telephone Encounter (Signed)
 SABRA

## 2024-10-13 ENCOUNTER — Ambulatory Visit: Admitting: Internal Medicine

## 2024-11-08 ENCOUNTER — Ambulatory Visit: Admitting: Gastroenterology

## 2024-11-24 ENCOUNTER — Ambulatory Visit: Admitting: Nurse Practitioner

## 2024-11-24 ENCOUNTER — Encounter: Admitting: Nutrition

## 2025-03-29 ENCOUNTER — Other Ambulatory Visit

## 2025-04-05 ENCOUNTER — Ambulatory Visit: Admitting: Physician Assistant

## 2025-09-14 ENCOUNTER — Telehealth: Admitting: Neurology
